# Patient Record
Sex: Male | Born: 1935 | Race: White | Hispanic: No | Smoking: Former smoker
Health system: Southern US, Community
[De-identification: ages and names within clinical notes are randomized; demographics above are authoritative.]

## PROBLEM LIST (undated history)

## (undated) DIAGNOSIS — F333 Major depressive disorder, recurrent, severe with psychotic symptoms: Secondary | ICD-10-CM

## (undated) DIAGNOSIS — J45909 Unspecified asthma, uncomplicated: Secondary | ICD-10-CM

## (undated) DIAGNOSIS — F411 Generalized anxiety disorder: Secondary | ICD-10-CM

## (undated) DIAGNOSIS — J449 Chronic obstructive pulmonary disease, unspecified: Secondary | ICD-10-CM

## (undated) DIAGNOSIS — G4700 Insomnia, unspecified: Secondary | ICD-10-CM

## (undated) DIAGNOSIS — C801 Malignant (primary) neoplasm, unspecified: Secondary | ICD-10-CM

## (undated) HISTORY — DX: Major depressive disorder, recurrent, severe with psychotic symptoms: F33.3

## (undated) HISTORY — PX: LUNG SURGERY: SHX703

## (undated) HISTORY — PX: HEMORROIDECTOMY: SUR656

## (undated) HISTORY — DX: Insomnia, unspecified: G47.00

## (undated) HISTORY — DX: Generalized anxiety disorder: F41.1

---

## 2004-10-29 ENCOUNTER — Ambulatory Visit: Payer: Self-pay | Admitting: Unknown Physician Specialty

## 2005-02-18 ENCOUNTER — Ambulatory Visit: Payer: Self-pay | Admitting: Internal Medicine

## 2005-05-03 ENCOUNTER — Inpatient Hospital Stay: Payer: Self-pay | Admitting: Psychiatry

## 2006-12-31 ENCOUNTER — Other Ambulatory Visit: Payer: Self-pay

## 2006-12-31 ENCOUNTER — Emergency Department: Payer: Self-pay | Admitting: Emergency Medicine

## 2007-11-29 ENCOUNTER — Ambulatory Visit: Payer: Self-pay | Admitting: Internal Medicine

## 2008-02-11 ENCOUNTER — Ambulatory Visit: Payer: Self-pay | Admitting: Internal Medicine

## 2008-04-03 ENCOUNTER — Ambulatory Visit: Payer: Self-pay | Admitting: Ophthalmology

## 2008-04-15 ENCOUNTER — Ambulatory Visit: Payer: Self-pay | Admitting: Ophthalmology

## 2009-03-01 ENCOUNTER — Inpatient Hospital Stay: Payer: Self-pay | Admitting: Internal Medicine

## 2009-07-22 ENCOUNTER — Ambulatory Visit: Payer: Self-pay | Admitting: Ophthalmology

## 2010-01-04 ENCOUNTER — Ambulatory Visit: Payer: Self-pay | Admitting: Unknown Physician Specialty

## 2010-05-03 ENCOUNTER — Emergency Department: Payer: Self-pay | Admitting: Unknown Physician Specialty

## 2010-05-19 ENCOUNTER — Inpatient Hospital Stay: Payer: Self-pay | Admitting: Surgery

## 2010-06-01 ENCOUNTER — Ambulatory Visit: Payer: Self-pay | Admitting: Surgery

## 2010-12-31 ENCOUNTER — Ambulatory Visit: Payer: Self-pay | Admitting: Internal Medicine

## 2011-08-27 ENCOUNTER — Emergency Department: Payer: Self-pay | Admitting: *Deleted

## 2011-08-27 LAB — BASIC METABOLIC PANEL
BUN: 13 mg/dL (ref 7–18)
Co2: 29 mmol/L (ref 21–32)
EGFR (Non-African Amer.): >60
Glucose: 95 mg/dL (ref 65–99)
Potassium: 4.3 mmol/L (ref 3.5–5.1)

## 2011-08-27 LAB — PROTIME-INR
INR: 0.9
Prothrombin Time: 12.8 secs (ref 11.5–14.7)

## 2011-08-27 LAB — CBC
HCT: 35.7 % — ABNORMAL LOW (ref 40.0–52.0)
HGB: 12.2 g/dL — ABNORMAL LOW (ref 13.0–18.0)
MCHC: 34.1 g/dL (ref 32.0–36.0)
MCV: 94 fL (ref 80–100)
Platelet: 183 10*3/uL (ref 150–440)
RBC: 3.8 10*6/uL — ABNORMAL LOW (ref 4.40–5.90)

## 2012-02-01 ENCOUNTER — Ambulatory Visit: Payer: Self-pay | Admitting: Internal Medicine

## 2013-02-11 ENCOUNTER — Ambulatory Visit: Payer: Self-pay | Admitting: Unknown Physician Specialty

## 2013-02-12 LAB — PATHOLOGY REPORT

## 2013-03-27 ENCOUNTER — Ambulatory Visit: Payer: Self-pay | Admitting: Internal Medicine

## 2013-04-22 ENCOUNTER — Inpatient Hospital Stay: Payer: Self-pay | Admitting: Surgery

## 2013-04-22 LAB — APTT: ACTIVATED PTT: 29.2 s (ref 23.6–35.9)

## 2013-04-22 LAB — BASIC METABOLIC PANEL
Anion Gap: 6 — ABNORMAL LOW (ref 7–16)
BUN: 24 mg/dL — AB (ref 7–18)
CO2: 29 mmol/L (ref 21–32)
Calcium, Total: 9.2 mg/dL (ref 8.5–10.1)
Chloride: 101 mmol/L (ref 98–107)
Creatinine: 0.94 mg/dL (ref 0.60–1.30)
Glucose: 112 mg/dL — ABNORMAL HIGH (ref 65–99)
Osmolality: 277 (ref 275–301)
Potassium: 3.7 mmol/L (ref 3.5–5.1)
SODIUM: 136 mmol/L (ref 136–145)

## 2013-04-22 LAB — CK TOTAL AND CKMB (NOT AT ARMC)
CK, TOTAL: 96 U/L (ref 35–232)
CK-MB: 1.7 ng/mL (ref 0.5–3.6)

## 2013-04-22 LAB — CBC
HCT: 36.9 % — AB (ref 40.0–52.0)
HGB: 12.2 g/dL — ABNORMAL LOW (ref 13.0–18.0)
MCH: 30.8 pg (ref 26.0–34.0)
MCHC: 33.1 g/dL (ref 32.0–36.0)
MCV: 93 fL (ref 80–100)
Platelet: 198 10*3/uL (ref 150–440)
RBC: 3.97 10*6/uL — AB (ref 4.40–5.90)
RDW: 15.3 % — AB (ref 11.5–14.5)
WBC: 7 10*3/uL (ref 3.8–10.6)

## 2013-04-22 LAB — PROTIME-INR
INR: 1
PROTHROMBIN TIME: 13.1 s (ref 11.5–14.7)

## 2013-04-22 LAB — TROPONIN I: Troponin-I: <0.02 ng/mL

## 2013-04-23 ENCOUNTER — Encounter: Payer: Self-pay | Admitting: Oncology

## 2013-04-23 LAB — APTT: Activated PTT: 32.5 secs (ref 23.6–35.9)

## 2013-04-25 LAB — PATHOLOGY REPORT

## 2013-04-29 ENCOUNTER — Ambulatory Visit: Payer: Self-pay | Admitting: Cardiothoracic Surgery

## 2013-05-02 ENCOUNTER — Ambulatory Visit: Payer: Self-pay | Admitting: Cardiothoracic Surgery

## 2013-05-02 ENCOUNTER — Emergency Department: Payer: Self-pay | Admitting: Emergency Medicine

## 2013-05-02 LAB — URINALYSIS, COMPLETE
BACTERIA: NONE SEEN
Bilirubin,UR: NEGATIVE
Blood: NEGATIVE
Glucose,UR: NEGATIVE mg/dL (ref 0–75)
KETONE: NEGATIVE
Leukocyte Esterase: NEGATIVE
Nitrite: NEGATIVE
PH: 6 (ref 4.5–8.0)
Protein: NEGATIVE
RBC,UR: NONE SEEN /HPF (ref 0–5)
Specific Gravity: 1.006 (ref 1.003–1.030)
Squamous Epithelial: NONE SEEN
WBC UR: NONE SEEN /HPF (ref 0–5)

## 2013-05-19 ENCOUNTER — Ambulatory Visit: Payer: Self-pay | Admitting: Cardiothoracic Surgery

## 2013-06-16 ENCOUNTER — Ambulatory Visit: Payer: Self-pay | Admitting: Radiation Oncology

## 2013-06-16 ENCOUNTER — Ambulatory Visit: Payer: Self-pay | Admitting: Cardiothoracic Surgery

## 2013-08-17 ENCOUNTER — Inpatient Hospital Stay: Payer: Self-pay | Admitting: Internal Medicine

## 2013-08-17 LAB — TROPONIN I: Troponin-I: 0.02 ng/mL

## 2013-08-17 LAB — URINALYSIS, COMPLETE
Bacteria: NONE SEEN
Bilirubin,UR: NEGATIVE
GLUCOSE, UR: NEGATIVE mg/dL (ref 0–75)
KETONE: NEGATIVE
Nitrite: NEGATIVE
Ph: 6 (ref 4.5–8.0)
Protein: 100
Specific Gravity: 1.015 (ref 1.003–1.030)
Squamous Epithelial: 1
WBC UR: 9 /HPF (ref 0–5)

## 2013-08-17 LAB — CBC
HCT: 28.5 % — ABNORMAL LOW (ref 40.0–52.0)
HGB: 9.6 g/dL — ABNORMAL LOW (ref 13.0–18.0)
MCH: 30.6 pg (ref 26.0–34.0)
MCHC: 33.6 g/dL (ref 32.0–36.0)
MCV: 91 fL (ref 80–100)
Platelet: 212 10*3/uL (ref 150–440)
RBC: 3.13 10*6/uL — AB (ref 4.40–5.90)
RDW: 15.4 % — ABNORMAL HIGH (ref 11.5–14.5)
WBC: 9.3 10*3/uL (ref 3.8–10.6)

## 2013-08-17 LAB — BASIC METABOLIC PANEL
Anion Gap: 8 (ref 7–16)
BUN: 25 mg/dL — AB (ref 7–18)
CALCIUM: 7.8 mg/dL — AB (ref 8.5–10.1)
Chloride: 103 mmol/L (ref 98–107)
Co2: 29 mmol/L (ref 21–32)
Creatinine: 0.84 mg/dL (ref 0.60–1.30)
EGFR (African American): >60
EGFR (Non-African Amer.): >60
Glucose: 149 mg/dL — ABNORMAL HIGH (ref 65–99)
OSMOLALITY: 287 (ref 275–301)
POTASSIUM: 3.5 mmol/L (ref 3.5–5.1)
SODIUM: 140 mmol/L (ref 136–145)

## 2013-08-17 LAB — PRO B NATRIURETIC PEPTIDE: B-TYPE NATIURETIC PEPTID: 440 pg/mL (ref 0–450)

## 2013-08-18 LAB — BASIC METABOLIC PANEL
ANION GAP: 7 (ref 7–16)
BUN: 18 mg/dL (ref 7–18)
CALCIUM: 7.8 mg/dL — AB (ref 8.5–10.1)
CO2: 30 mmol/L (ref 21–32)
Chloride: 106 mmol/L (ref 98–107)
Creatinine: 0.69 mg/dL (ref 0.60–1.30)
EGFR (African American): >60
EGFR (Non-African Amer.): >60
Glucose: 107 mg/dL — ABNORMAL HIGH (ref 65–99)
Osmolality: 287 (ref 275–301)
Potassium: 2.9 mmol/L — ABNORMAL LOW (ref 3.5–5.1)
SODIUM: 143 mmol/L (ref 136–145)

## 2013-08-18 LAB — MAGNESIUM: Magnesium: 2 mg/dL

## 2013-08-19 LAB — MAGNESIUM: Magnesium: 1.8 mg/dL

## 2013-08-19 LAB — BASIC METABOLIC PANEL
Anion Gap: 5 — ABNORMAL LOW (ref 7–16)
BUN: 11 mg/dL (ref 7–18)
CALCIUM: 8 mg/dL — AB (ref 8.5–10.1)
CHLORIDE: 102 mmol/L (ref 98–107)
CO2: 33 mmol/L — AB (ref 21–32)
CREATININE: 0.87 mg/dL (ref 0.60–1.30)
EGFR (African American): >60
GLUCOSE: 111 mg/dL — AB (ref 65–99)
Osmolality: 279 (ref 275–301)
Potassium: 3.4 mmol/L — ABNORMAL LOW (ref 3.5–5.1)
Sodium: 140 mmol/L (ref 136–145)

## 2013-08-20 LAB — BASIC METABOLIC PANEL
Anion Gap: 3 — ABNORMAL LOW (ref 7–16)
BUN: 20 mg/dL — AB (ref 7–18)
CALCIUM: 8.6 mg/dL (ref 8.5–10.1)
CHLORIDE: 104 mmol/L (ref 98–107)
CO2: 34 mmol/L — AB (ref 21–32)
CREATININE: 0.8 mg/dL (ref 0.60–1.30)
EGFR (Non-African Amer.): >60
Glucose: 187 mg/dL — ABNORMAL HIGH (ref 65–99)
Osmolality: 289 (ref 275–301)
POTASSIUM: 3.9 mmol/L (ref 3.5–5.1)
Sodium: 141 mmol/L (ref 136–145)

## 2013-08-20 LAB — URINE CULTURE

## 2013-08-21 ENCOUNTER — Encounter: Payer: Self-pay | Admitting: Internal Medicine

## 2013-08-22 LAB — CULTURE, BLOOD (SINGLE)

## 2013-10-03 ENCOUNTER — Ambulatory Visit: Payer: Self-pay | Admitting: Radiation Oncology

## 2013-10-10 ENCOUNTER — Ambulatory Visit: Payer: Self-pay | Admitting: Radiation Oncology

## 2013-12-01 ENCOUNTER — Emergency Department: Payer: Self-pay | Admitting: Internal Medicine

## 2013-12-01 LAB — URINALYSIS, COMPLETE
BILIRUBIN, UR: NEGATIVE
Bacteria: NONE SEEN
Glucose,UR: NEGATIVE mg/dL (ref 0–75)
KETONE: NEGATIVE
NITRITE: NEGATIVE
PROTEIN: NEGATIVE
Ph: 6 (ref 4.5–8.0)
Specific Gravity: 1.009 (ref 1.003–1.030)
Squamous Epithelial: 1
WBC UR: 4 /HPF (ref 0–5)

## 2013-12-21 ENCOUNTER — Emergency Department: Payer: Self-pay | Admitting: Emergency Medicine

## 2013-12-21 LAB — COMPREHENSIVE METABOLIC PANEL
Albumin: 3.5 g/dL (ref 3.4–5.0)
Alkaline Phosphatase: 72 U/L
Anion Gap: 2 — ABNORMAL LOW (ref 7–16)
BILIRUBIN TOTAL: 0.2 mg/dL (ref 0.2–1.0)
BUN: 18 mg/dL (ref 7–18)
CHLORIDE: 107 mmol/L (ref 98–107)
CO2: 29 mmol/L (ref 21–32)
Calcium, Total: 8.7 mg/dL (ref 8.5–10.1)
Creatinine: 0.79 mg/dL (ref 0.60–1.30)
EGFR (African American): >60
EGFR (Non-African Amer.): >60
GLUCOSE: 98 mg/dL (ref 65–99)
Osmolality: 278 (ref 275–301)
Potassium: 3.8 mmol/L (ref 3.5–5.1)
SGOT(AST): 16 U/L (ref 15–37)
SGPT (ALT): 22 U/L
Sodium: 138 mmol/L (ref 136–145)
TOTAL PROTEIN: 7.1 g/dL (ref 6.4–8.2)

## 2013-12-21 LAB — CBC
HCT: 40.3 % (ref 40.0–52.0)
HGB: 12.8 g/dL — ABNORMAL LOW (ref 13.0–18.0)
MCH: 29.6 pg (ref 26.0–34.0)
MCHC: 31.8 g/dL — ABNORMAL LOW (ref 32.0–36.0)
MCV: 93 fL (ref 80–100)
Platelet: 164 10*3/uL (ref 150–440)
RBC: 4.33 10*6/uL — AB (ref 4.40–5.90)
RDW: 14.9 % — ABNORMAL HIGH (ref 11.5–14.5)
WBC: 6.5 10*3/uL (ref 3.8–10.6)

## 2014-03-12 ENCOUNTER — Ambulatory Visit: Payer: Self-pay | Admitting: Radiation Oncology

## 2014-04-02 LAB — HEPATIC FUNCTION PANEL
ALK PHOS: 58 U/L (ref 25–125)
ALT: 14 U/L (ref 10–40)
AST: 14 U/L (ref 14–40)
Bilirubin, Total: 0.5 mg/dL

## 2014-04-02 LAB — LIPID PANEL
Cholesterol: 147 mg/dL (ref 0–200)
HDL: 64 mg/dL (ref 35–70)
LDL CALC: 61 mg/dL
TRIGLYCERIDES: 108 mg/dL (ref 40–160)

## 2014-04-02 LAB — BASIC METABOLIC PANEL
BUN: 16 mg/dL (ref 4–21)
Creatinine: 0.8 mg/dL (ref <=1.3)
GLUCOSE: 9 mg/dL
POTASSIUM: 5.3 mmol/L (ref 3.4–5.3)
SODIUM: 142 mmol/L (ref 137–147)

## 2014-04-02 LAB — TSH: TSH: 1.58 u[IU]/mL (ref 0.41–5.90)

## 2014-04-07 ENCOUNTER — Inpatient Hospital Stay: Payer: Self-pay | Admitting: Internal Medicine

## 2014-04-07 LAB — COMPREHENSIVE METABOLIC PANEL
ALK PHOS: 62 U/L
ALT: 12 U/L — AB
Albumin: 2.6 g/dL — ABNORMAL LOW (ref 3.4–5.0)
Anion Gap: 8 (ref 7–16)
BILIRUBIN TOTAL: 0.5 mg/dL (ref 0.2–1.0)
BUN: 27 mg/dL — AB (ref 7–18)
CO2: 26 mmol/L (ref 21–32)
CREATININE: 1.21 mg/dL (ref 0.60–1.30)
Calcium, Total: 7.8 mg/dL — ABNORMAL LOW (ref 8.5–10.1)
Chloride: 104 mmol/L (ref 98–107)
EGFR (African American): >60
EGFR (Non-African Amer.): >60
GLUCOSE: 157 mg/dL — AB (ref 65–99)
Osmolality: 284 (ref 275–301)
Potassium: 3.5 mmol/L (ref 3.5–5.1)
SGOT(AST): 20 U/L (ref 15–37)
SODIUM: 138 mmol/L (ref 136–145)
Total Protein: 6.7 g/dL (ref 6.4–8.2)

## 2014-04-07 LAB — CBC WITH DIFFERENTIAL/PLATELET
BASOS ABS: 0 10*3/uL (ref 0.0–0.1)
Basophil %: 0.1 %
EOS ABS: 0 10*3/uL (ref 0.0–0.7)
Eosinophil %: 0.3 %
HCT: 34.2 % — ABNORMAL LOW (ref 40.0–52.0)
HGB: 11.4 g/dL — ABNORMAL LOW (ref 13.0–18.0)
LYMPHS PCT: 7.1 %
Lymphocyte #: 0.7 10*3/uL — ABNORMAL LOW (ref 1.0–3.6)
MCH: 32.2 pg (ref 26.0–34.0)
MCHC: 33.3 g/dL (ref 32.0–36.0)
MCV: 97 fL (ref 80–100)
MONO ABS: 0.8 x10 3/mm (ref 0.2–1.0)
Monocyte %: 8.2 %
NEUTROS ABS: 7.7 10*3/uL — AB (ref 1.4–6.5)
Neutrophil %: 84.3 %
Platelet: 145 10*3/uL — ABNORMAL LOW (ref 150–440)
RBC: 3.54 10*6/uL — AB (ref 4.40–5.90)
RDW: 14.7 % — AB (ref 11.5–14.5)
WBC: 9.2 10*3/uL (ref 3.8–10.6)

## 2014-04-07 LAB — URINALYSIS, COMPLETE
Bilirubin,UR: NEGATIVE
GLUCOSE, UR: NEGATIVE mg/dL (ref 0–75)
KETONE: NEGATIVE
Nitrite: POSITIVE
PH: 5 (ref 4.5–8.0)
Protein: 100
RBC,UR: 16 /HPF (ref 0–5)
SQUAMOUS EPITHELIAL: NONE SEEN
Specific Gravity: 1.016 (ref 1.003–1.030)
WBC UR: 1354 /HPF (ref 0–5)

## 2014-04-07 LAB — PROTIME-INR
INR: 1.2
Prothrombin Time: 14.6 secs (ref 11.5–14.7)

## 2014-04-07 LAB — TROPONIN I: Troponin-I: <0.02 ng/mL

## 2014-04-08 LAB — CBC WITH DIFFERENTIAL/PLATELET
BASOS ABS: 0 10*3/uL (ref 0.0–0.1)
BASOS PCT: 0 %
EOS ABS: 0 10*3/uL (ref 0.0–0.7)
EOS PCT: 0 %
HCT: 32.3 % — ABNORMAL LOW (ref 40.0–52.0)
HGB: 10.5 g/dL — ABNORMAL LOW (ref 13.0–18.0)
Lymphocyte #: 0.3 10*3/uL — ABNORMAL LOW (ref 1.0–3.6)
Lymphocyte %: 4.2 %
MCH: 31.9 pg (ref 26.0–34.0)
MCHC: 32.5 g/dL (ref 32.0–36.0)
MCV: 98 fL (ref 80–100)
MONOS PCT: 5.2 %
Monocyte #: 0.4 x10 3/mm (ref 0.2–1.0)
NEUTROS ABS: 6.7 10*3/uL — AB (ref 1.4–6.5)
Neutrophil %: 90.6 %
Platelet: 127 10*3/uL — ABNORMAL LOW (ref 150–440)
RBC: 3.29 10*6/uL — ABNORMAL LOW (ref 4.40–5.90)
RDW: 14.8 % — AB (ref 11.5–14.5)
WBC: 7.4 10*3/uL (ref 3.8–10.6)

## 2014-04-08 LAB — BASIC METABOLIC PANEL
Anion Gap: 5 — ABNORMAL LOW (ref 7–16)
BUN: 21 mg/dL — ABNORMAL HIGH (ref 7–18)
Calcium, Total: 7.7 mg/dL — ABNORMAL LOW (ref 8.5–10.1)
Chloride: 110 mmol/L — ABNORMAL HIGH (ref 98–107)
Co2: 28 mmol/L (ref 21–32)
Creatinine: 0.9 mg/dL (ref 0.60–1.30)
EGFR (African American): >60
Glucose: 175 mg/dL — ABNORMAL HIGH (ref 65–99)
Osmolality: 292 (ref 275–301)
POTASSIUM: 3.5 mmol/L (ref 3.5–5.1)
Sodium: 143 mmol/L (ref 136–145)

## 2014-04-09 ENCOUNTER — Emergency Department: Payer: Self-pay | Admitting: Emergency Medicine

## 2014-04-09 LAB — COMPREHENSIVE METABOLIC PANEL
ALBUMIN: 2.4 g/dL — AB (ref 3.4–5.0)
ALK PHOS: 65 U/L
Anion Gap: 7 (ref 7–16)
BILIRUBIN TOTAL: 0.4 mg/dL (ref 0.2–1.0)
BUN: 18 mg/dL (ref 7–18)
CALCIUM: 8.2 mg/dL — AB (ref 8.5–10.1)
CO2: 30 mmol/L (ref 21–32)
Chloride: 104 mmol/L (ref 98–107)
Creatinine: 0.93 mg/dL (ref 0.60–1.30)
EGFR (Non-African Amer.): >60
GLUCOSE: 92 mg/dL (ref 65–99)
OSMOLALITY: 283 (ref 275–301)
Potassium: 4.2 mmol/L (ref 3.5–5.1)
SGOT(AST): 33 U/L (ref 15–37)
SGPT (ALT): 18 U/L
Sodium: 141 mmol/L (ref 136–145)
Total Protein: 6.5 g/dL (ref 6.4–8.2)

## 2014-04-09 LAB — URINALYSIS, COMPLETE
BACTERIA: NONE SEEN
Bilirubin,UR: NEGATIVE
Glucose,UR: NEGATIVE mg/dL (ref 0–75)
KETONE: NEGATIVE
Leukocyte Esterase: NEGATIVE
Nitrite: NEGATIVE
PH: 6 (ref 4.5–8.0)
Protein: NEGATIVE
RBC,UR: NONE SEEN /HPF (ref 0–5)
SPECIFIC GRAVITY: 1.006 (ref 1.003–1.030)
Squamous Epithelial: <1
WBC UR: 4 /HPF (ref 0–5)

## 2014-04-09 LAB — CBC WITH DIFFERENTIAL/PLATELET
BASOS PCT: 0.2 %
Basophil #: 0 10*3/uL (ref 0.0–0.1)
EOS ABS: 0 10*3/uL (ref 0.0–0.7)
Eosinophil %: 0.2 %
HCT: 33.5 % — AB (ref 40.0–52.0)
HGB: 11.1 g/dL — ABNORMAL LOW (ref 13.0–18.0)
LYMPHS ABS: 0.9 10*3/uL — AB (ref 1.0–3.6)
Lymphocyte %: 8.2 %
MCH: 31.8 pg (ref 26.0–34.0)
MCHC: 33.1 g/dL (ref 32.0–36.0)
MCV: 96 fL (ref 80–100)
MONO ABS: 1.2 x10 3/mm — AB (ref 0.2–1.0)
MONOS PCT: 11.5 %
NEUTROS PCT: 79.9 %
Neutrophil #: 8.5 10*3/uL — ABNORMAL HIGH (ref 1.4–6.5)
PLATELETS: 180 10*3/uL (ref 150–440)
RBC: 3.48 10*6/uL — ABNORMAL LOW (ref 4.40–5.90)
RDW: 15.2 % — ABNORMAL HIGH (ref 11.5–14.5)
WBC: 10.7 10*3/uL — ABNORMAL HIGH (ref 3.8–10.6)

## 2014-04-09 LAB — TROPONIN I

## 2014-04-11 LAB — URINE CULTURE

## 2014-04-12 LAB — CULTURE, BLOOD (SINGLE)

## 2014-04-14 LAB — CULTURE, BLOOD (SINGLE)

## 2014-04-16 ENCOUNTER — Ambulatory Visit: Payer: Self-pay | Admitting: Radiation Oncology

## 2014-08-01 DIAGNOSIS — J449 Chronic obstructive pulmonary disease, unspecified: Secondary | ICD-10-CM | POA: Insufficient documentation

## 2014-08-01 DIAGNOSIS — N139 Obstructive and reflux uropathy, unspecified: Secondary | ICD-10-CM | POA: Insufficient documentation

## 2014-08-01 DIAGNOSIS — G4700 Insomnia, unspecified: Secondary | ICD-10-CM | POA: Insufficient documentation

## 2014-08-01 DIAGNOSIS — H3530 Unspecified macular degeneration: Secondary | ICD-10-CM | POA: Insufficient documentation

## 2014-08-01 DIAGNOSIS — Z859 Personal history of malignant neoplasm, unspecified: Secondary | ICD-10-CM | POA: Insufficient documentation

## 2014-08-01 DIAGNOSIS — N429 Disorder of prostate, unspecified: Secondary | ICD-10-CM | POA: Insufficient documentation

## 2014-08-01 DIAGNOSIS — Z8719 Personal history of other diseases of the digestive system: Secondary | ICD-10-CM | POA: Insufficient documentation

## 2014-08-01 DIAGNOSIS — F411 Generalized anxiety disorder: Secondary | ICD-10-CM | POA: Insufficient documentation

## 2014-08-01 DIAGNOSIS — F333 Major depressive disorder, recurrent, severe with psychotic symptoms: Secondary | ICD-10-CM | POA: Insufficient documentation

## 2014-08-01 DIAGNOSIS — Z8639 Personal history of other endocrine, nutritional and metabolic disease: Secondary | ICD-10-CM | POA: Insufficient documentation

## 2014-08-09 NOTE — Discharge Summary (Signed)
PATIENT NAME:  WHITNEY, BINGAMAN MR#:  [id number] DATE OF BIRTH:  01-Nov-1929  DATE OF ADMISSION:  08/17/2013 DATE OF DISCHARGE:  08/21/2013  Addendum  The patient is being discharged 08/21/2013.  No changes to any of his medications. Insurance was not approved by his planned discharge date of the 5th; that was why he was kept in the hospital.   ____________________________ Homero Hyson P. Benjie Karvonen, MD spm:dmm D: 08/21/2013 12:26:56 ET T: 08/21/2013 21:17:30 ET JOB#: [id number]  cc: Keyondre Hepburn P. Benjie Karvonen, MD, <Dictator> Donell Beers Wilberto Console MD ELECTRONICALLY SIGNED 08/22/2013 14:14

## 2014-08-09 NOTE — H&P (Signed)
PATIENT NAME:  Timothy Ali, ISENBERG MR#:  [id number] DATE OF BIRTH:  [birth year]  DATE OF ADMISSION:  04/22/2013  REASON FOR ADMISSION: Left spontaneous recurrent pneumothorax.   HISTORY OF PRESENT ILLNESS: Mr. Deal is a pleasant 81 year old male with a history of COPD, who is on oxygen p.r.n., who was previously admitted in 2012 for spontaneous pneumothorax. He had been suffering from a cold  for which he was on ciprofloxacin as well as Mucomyst for the past 3 weeks and woke up with shortness of breath and left-sided abdominal pain. He is presently doing well. No fevers, chills or night sweats. Positive shortness of breath. No abdominal pain, nausea, vomiting, diarrhea, constipation, dysuria or hematuria.   PAST MEDICAL HISTORY:  1.  COPD. Oxygen at home on a p.r.n. basis.  2.  History of anxiety.  3.  History of depression.  4.  History of spontaneous pneumothorax and left tube thoracostomy placement.  5.  History of hemorrhoids.  6.  History of left arm surgery.  7.  History of left neck surgery.   FAMILY HISTORY: Noncontributory.   SOCIAL HISTORY: He stopped smoking 17 years ago. Denies alcohol. He is retired.   ALLERGIES: PENICILLIN.   MEDICATIONS: As follows:  1.  Phenytoin. 2.  Terazosin. 3.  Spiriva. 4.  Seroquel.  5.  Nexium. 6.  Lipitor. 7.  Flomax.  8.  Vitamins.   9.  Effexor.  10. Dulara.  11. Ativan.   REVIEW OF SYSTEMS: A 12-point review of systems was obtained. Pertinent positives and negatives as above.   PHYSICAL EXAMINATION:  VITAL SIGNS: Temperature 97.9, pulse 113, blood pressure 131/74, respirations 25 and 93% on room air.  GENERAL: No acute distress. Alert and oriented x 3. HEENT: Normocephalic, atraumatic. Eyes: No scleral icterus. No conjunctivitis. Face: No obvious facial trauma. Normal external nose. Normal external ears. CHEST: Decreased breath sounds on the left. He does appear to be short of breath.  ABDOMEN: Soft, nontender, nondistended.   EXTREMITIES: Moves all extremities well. Strength 5 out of 5.  NEUROLOGIC: Cranial nerves II through XII grossly intact.   LABORATORY DATA: Significant for a white cell count of 7, otherwise unremarkable. Chest shows a significant left-sided pneumothorax.   ASSESSMENT AND PLAN: Mr. Goggins is a pleasant 81 year old male with a significant left-sided pneumothorax. He is to undergo chest tube placement and subsequent evaluation with Dr. Genevive Bi for likely thoracostomy and pleurodesis. I have spoken to Mr. Pruett about the potential benefits and complications associated with chest tube placement and informed consent was obtained.    ____________________________ Glena Norfolk. Zacheriah Stumpe, MD cal:aw D: 04/22/2013 06:52:59 ET T: 04/22/2013 07:10:48 ET JOB#: [id number]  cc: Harrell Gave A. Tayshon Winker, MD, <Dictator> Floyde Parkins MD ELECTRONICALLY SIGNED 04/30/2013 16:43

## 2014-08-09 NOTE — H&P (Signed)
PATIENT NAME:  Timothy Ali, BOOMHOWER MR#:  [id number] DATE OF BIRTH:  Nov 11, 1929  DATE OF ADMISSION:  08/17/2013  CHIEF COMPLAINT: Altered mental status.   HISTORY OF PRESENT ILLNESS: The patient is an 81 year old white male with a history of severe COPD, complicated by restrictive physiology from fibrosis with a recent diagnosis of squamous cell carcinoma of the left lung found during admission for a large pneumothorax in January 2015, who presents today with a 1-2-week history of altered mental status. The patient is a poor historian and all history is provided by his 2 sisters who are present today. The sisters state that for the last several days he has been very delirious and having fevers and decreased urination. He lives at home and is supposed to be self catheterization himself 4 times daily due to an as yet undiagnosed bladder problem. It is unclear whether he has prostatic or urethral obstruction but he has been seen by Dr. Elnoria Howard, his urologist and has had a cystoscopy and some type of procedure which unblocked his urethra. The patient states that he has been self catheterization himself 3 times a day instead of 4 times a day. However, today he was complaining of severe back pain and given his confusion, his sisters insisted that he come to the Emergency Room. He has a chronic productive cough due to COPD and they do not think his cough is over his baseline cough. However, they have not really seen him very much over the last several days because he does live alone. The patient was admitted in January for treatment of a large left pneumothorax which required a tube/thoracostomy and during that admission was found to have a squamous cell cancer of the lung. This was treated with XRT by Dr. Baruch Gouty and per family, the cancer has been eradicated and patient is to follow up with a repeat CT scan in June with Dr. Ma Hillock.   The patient's primary care is Dr. Benita Stabile in [place]. He was previously under the  psychiatric care of Dr. Honor Loh for bipolar disorder but is currently without a psychiatrist. His urologist is Dr. Elnoria Howard.   PAST MEDICAL HISTORY:  1. Squamous cell cancer of the lung diagnosed in January 2015, with a left lower lobe nodule. Supposedly, now in remission, status post XRT. He is not a candidate for pneumonectomy or lobectomy due to his severe COPD.  2. COPD, severe by pulmonary function test done in January with restrictive volumes noted.  3. History of bipolar disorder and other psychiatric diagnoses, NOS, on chronic Seroquel for years.   MEDICATIONS:  1. Seroquel 300 mg b.i.d. 2. Effexor 75 mg once daily. 3. Dulera 100 mcg 2 puffs inhaled to 2 times daily.  4. Ciprofloxacin 500 mg every 12 hours. 5. Ativan 1 mg orally 3 times daily. 6. Flomax 0.4 mg once daily.  7. Lipitor 40 mg once daily.  8. Spiriva 18 mcg 1 nebulizer inhaled daily.  9. Terazosin 10 mg once daily.  10. Ventolin HFA 90 mcg 2 puffs 4 times daily.  11. Zolpidem 10 mg 1 daily.   PAST SURGICAL HISTORY: Left tube thoracostomy for management of spontaneous pneumothorax January 2000, history of left arm surgery, and history of left neck surgery.    ALLERGIES: PENICILLIN.   LAST HOSPITALIZATION: January 2015.   FAMILY HISTORY: Noncontributory.   SOCIAL HISTORY: He is an ex-smoker, quit 15 years ago. He is widowed for 5 years. He has 2 estranged daughters who live in [place] and out  of state. Two sisters who live close by.   REVIEW OF SYSTEMS: Difficult due to his altered mental status. He denies weight loss and fevers. He denies any changes in vision. He denies any trouble swallowing. He denies any hemoptysis. He has a chronic cough with wheezing due to COPD. He denies chest pain. He has noted some dyspnea with exertion. He has no history of nausea, vomiting, diarrhea. He does endorse currently back pain. He denies polyuria and nocturia. He states that he catheterizes himself once in the evenings and  twice during the day. He denies any history of bruising or bleeding. He does have some nonspecific back pain but he cannot elaborate on it. He has no history of tremors, vertigos, or strokes. He does have a history of bipolar and some other psychiatric disorder and has chronic insomnia too.   PHYSICAL EXAMINATION:  GENERAL: This is a thin elderly male in no apparent distress.  VITAL SIGNS: Blood pressure 145/64, repeat 131/63, pulse initially 110, now 92. Temperature 100.5 T-max, respirations 20. He is sating 98.9% on room air.  HEENT: Pupils are equal, round, and reactive to light. Extraocular movements are intact. Sclerae are non icteric.  OROPHARYNX: Benign.  NECK: Supple without lymphadenopathy, JVD, thyromegaly, or carotid bruits.  LUNGS: Notable for decreased breath sounds at the left base with some end-expiratory wheezing. No egophony noted.  CARDIOVASCULAR: Tachycardic but regular with no murmurs, rubs, or gallops.  ABDOMEN: Soft, nontender, and nondistended with good bowel sounds and no evidence of hepatosplenomegaly. He is moving all extremities, but gait was not tested.  SKIN: Skin is warm and dry without rashes or lesions.  LYMPH: There is no cervical, axillary, inguinal, or supraclavicular lymphadenopathy.  NEUROLOGICAL: Exam appears to be grossly nonfocal. He is alert but not oriented to person, place, or time. He is somewhat agitated but does follow commands.  LABORATORY DATA: Sodium 140, potassium 3.5, chloride 103, bicarbonate 29, BUN 25, creatinine 0.84, glucose 149. White count 9.3, hemoglobin 9.6, platelets 212,000. Urinalysis shows 9 white cells, 11 red cells, no leukocyte esterase or nitrates. Troponin I is 0.02.   Chest x-ray portable: Interval mild changes of congestive heart failure, superimposed on COPD, interval patchy opacity in the left mid and lower lung zones and right lung base. Differential considerations included pneumonia and asymmetrical alveolar edema/post  radiation changes. EKG shows sinus tachycardia with nonspecific ST-T wave changes. No old one for comparison at this time.   ASSESSMENT AND PLAN:  1. Delirium likely secondary to hypoxia, pneumonia, and recent evidence of a urinary tract infection. Will continue Seroquel and Ativan. Admit to med-surg unit. Continue antibiotics and had gentle hydration as he does appear to be dehydrated based on his history and skin turgor.  2. Left lower lobe patchy opacity concerning for pneumonia versus recurrence of lung cancer.  3. Once he is better hydrated, I will recommend CT of the chest for further evaluation.  4. Urinary retention. Unclear why he is having urinary retention whether this is neurogenic bladder or prostatic hypertrophy. He does have a local urologist, who has been treating him with Flomax and terazosin suggesting that benign prostatic hypertrophy is the cause. He was recently treated with Cipro. He is currently getting Levaquin for presumed pneumonia. He has refused a Foley catheter and states that he continues to prefer in and out caths, which will be ordered every 6 hours along with a bladder scan.  5. Chronic obstructive pulmonary disease. Continue Spiriva and use Symbicort while in house since  Ruthe Mannan is not available. DuoNeb is ordered. We will consider adding prednisone but given his altered mental status I am concerned this may add his delirium.  6. History of bipolar disorder. Continue Seroquel 300 mg b.i.d. and Ativan and Effexor.  7. Goals of care: The patient has no healthcare power of attorney. No living will. I did discuss this with his sisters today with the recommendations at the family decide very soon, who will be his healthcare power of attorney given his serious medical conditions.  ESTIMATED TIME OF CARE: 60 minutes.    ____________________________ Deborra Medina, MD tt:lt D: 08/17/2013 20:19:55 ET T: 08/17/2013 21:53:10 ET JOB#: [id number]  cc: Deborra Medina, MD,  <Dictator> Deborra Medina MD ELECTRONICALLY SIGNED 08/20/2013 7:53

## 2014-08-09 NOTE — Consult Note (Signed)
PATIENT NAME:  Timothy Ali, Timothy Ali MR#:  [id number] DATE OF BIRTH:  1930-03-23  DATE OF CONSULTATION:  04/09/2014  CONSULTING PHYSICIAN:  Tana Conch. Leslye Peer, MD  PRIMARY CARE PHYSICIAN:  Dr. Benita Stabile  EMERGENCY ROOM PHYSICIAN:  Dr. Yetta Numbers. Karma Greaser.   CHIEF COMPLAINT: Shortness of breath.   HISTORY OF PRESENT ILLNESS: This is an 81 year old man with COPD, end-stage coming in with shortness of breath. He was recently admitted to the hospital on 04/07/2014 and discharged 04/08/2014, had been admitted for pneumonia. He was initially started on Rocephin and Zithromax and then sent home on Levaquin. He took his first dose of Levaquin this morning likely got two doses of the Rocephin and Zithromax in the hospital. He developed worsening shortness of breath today and came into the ER for further evaluation. In the ER, he had a CT scan of the chest that showed no evidence of acute pulmonary embolism. Bilateral airspace disease is stable compared with two days ago. Urinalysis looks better than two days ago. The patient states that he had a low-grade fever and he is feeling fatigued, even when he feels well he does have shortness of breath and is limited with activity. Hospitalist services were contacted for a second opinion on what to do next  PAST MEDICAL HISTORY:  Squamous cell carcinoma of the left lung on radiation therapy, chronic obstructive pulmonary disease, chronic respiratory failure, bipolar disorder, hyperlipidemia, benign prostatic hypertrophy, insomnia, macular degeneration, and neurogenic bladder with self catheterizations.   PAST SURGICAL HISTORY: Left lung collapse x2, left arm surgery in the left neck surgery.   ALLERGIES: LISTED IN THE CHART AS PENICILLIN.   FAMILY HISTORY: Hypertension. Both parents died of lung cancer. Father died at 48. Mother died at 71.   SOCIAL HISTORY: A former smoker; quit 18 years back and lives alone.   MEDICATIONS:  Include albuterol CFC 90 mcg daily,  atorvastatin 40 mg daily, Dulera 105, 2 puffs twice a day, Levaquin 500 mg q.24 hours., lorazepam 1 mg 4 times a day, Mucinex 1200 mg twice a day, prednisone prescribed at discharge today, Seroquel 150 mg 2 tablets at bedtime, Spiriva 18 mcg daily, Flomax 0.4 mg daily, Terazosin 10 mg daily, Effexor 75 mg 2 capsules daily Ventolin HFA 2 puffs 4 times a day, Ambien 10 mg at bedtime.  REVIEW OF SYSTEMS:   CONSTITUTIONAL: Positive for fever. Positive for weight loss. Positive or fatigue.   EYES: He does have macular degeneration, right eye.  EARS, NOSE, MOUTH AND THROAT:  Positive for runny nose. No sore throat. No difficulty swallowing.   CARDIOVASCULAR: Chest pain with a cough.   RESPIRATORY: Positive for shortness breath. Positive for cough, yellow phlegm. No hemoptysis.   GASTROINTESTINAL: No nausea. No vomiting. No abdominal pain. No diarrhea. No constipation. No bright red blood per rectum. No melena.   GENITOURINARY: No burning on urination. No hematuria.   MUSCULOSKELETAL: Positive for joint pain in knees and hips.   INTEGUMENT: No rashes or eruptions.   NEUROLOGIC: No fainting or blackouts.   PSYCHIATRIC: Positive for anxiety and depression.   ENDOCRINE: No thyroid problems. Hematologic and lymphatic: No anemia.   PHYSICAL EXAMINATION:  VITAL SIGNS: Temperature 99, pulse 94, respirations 22, blood pressure 137/73, pulse oximetry 94% on oxygen.   GENERAL: No respiratory distress.   EYES: Conjunctivae and lids normal. Pupils equal, round, and reactive to light. Extraocular muscles intact. No nystagmus. Ears, nose, mouth and throat: Tympanic membranes: No erythema. Nasal mucosa: No erythema.   THROAT:  No erythema. No exudate seen. Lips and gums: No lesions.   NECK: No JVD. No bruits. No lymphadenopathy. No thyromegaly. No thyroid nodules palpated.   RESPIRATORY: Decreased breath sounds bilaterally. No rhonchi, rales, or wheeze heard.   CARDIOVASCULAR SYSTEM: S1, S2  normal. No gallops, rubs, or murmurs heard. Carotid upstroke 2+ bilaterally. No bruits. Dorsalis pedis pulses 2+ bilaterally. No edema of the lower extremity.   ABDOMEN: Soft, nontender. No organomegaly/splenomegaly. Normoactive bowel sounds. No masses felt.   LYMPHATIC: No lymph nodes in the neck.   MUSCULOSKELETAL: No clubbing. No edema. No cyanosis, on oxygen.   PSYCHIATRIC: The patient is alert, oriented to person, place, and time.   NEUROLOGIC: Cranial nerves II through XII grossly intact. Deep tendon reflexes 2+ bilateral lower extremities.   LABORATORY AND RADIOLOGICAL DATA: CT scan of the chest: No pulmonary embolism, bilateral airspace disease stable compared to two days ago. Lactic acid 1.2. Urinalysis 1+ blood, negative nitrites and leukocyte esterase. White blood cell count 10.7,H and H 11.1 and 33.5, platelet count 180, glucose 92, BUN 18, creatinine 0.93, sodium 141, potassium 4.2, chloride 104, CO2 of 30, and calcium 8.2. Liver function tests normal range. Albumin low at 2.4. Troponin negative.   EKG: Sinus tachy at 107 beats per minute.   ASSESSMENT AND PLAN:  1.  Pneumonia. The patient was in the hospital received Rocephin and Zithromax and upon discharge Levaquin 500 mg p.o. daily. I offered him the opportunity to come back in the hospital or the opportunity to go home. The patient decided he would go home and continue the treatment course at home.  2.  Chronic respiratory failure, chronic obstructive pulmonary disease. The patient was insistent on steroids to be added to the regimen and we gave him 125 mg of Solu-Medrol here, a prednisone taper 40 mg five day course. The patient will continue his inhalers.  3.  Squamous cell cancer of the lung. Follow up with radiation as outpatient.  4.  Bipolar and anxiety. The patient was very anxious about his breathing and his condition. He does have family members in the area, but does live alone. I offered the patient potential to go to  rehab if he is weak. The patient states "I'd rather be home."  5.  Hyperlipidemia, on Lipitor.  6.  Benign prostatic hypertrophy, on para Zosyn.  7.  Neurogenic bladder. The patient does self catheterizations. Urinalysis this time looks much better than two days ago. Unfortunately a urine culture was not sent off on the previous admission.    TIME SPENT ON ER CONSULTATION: 60 minutes.    ____________________________ Tana Conch. Leslye Peer, MD rjw:at D: 04/09/2014 17:26:15 ET T: 04/09/2014 17:54:23 ET JOB#: [id number]  cc: Tana Conch. Leslye Peer, MD, <Dictator> Leona Carry. Hall Busing, MD [place] Karma Greaser, MD Marisue Brooklyn MD ELECTRONICALLY SIGNED 04/17/2014 11:40

## 2014-08-09 NOTE — Discharge Summary (Signed)
PATIENT NAME:  Timothy Ali, Timothy Ali MR#:  [id number] DATE OF BIRTH:  19-Sep-1929  DATE OF ADMISSION:  08/17/2013 DATE OF DISCHARGE:  08/21/2013  ADMISSION DIAGNOSIS: Altered mental status secondary to hypoxia and pneumonia.  DISCHARGE DIAGNOSES:  1.  Delirium due to pneumonia and hypoxia.  2.  Acute respiratory failure from pneumonia.  3.  Pneumonia, community-acquired.  4.  Chronic obstructive pulmonary disease. 5.  Urinary retention. 6.  Lung cancer.  7.  Depression.   CONSULTATIONS: None.   DIAGNOSTIC DATA: At discharge Sodium 141, potassium 3.9, chloride 104, bicarb 34, BUN 20, creatinine 0.80, and glucose 187.   HOSPITAL COURSE: This is a very pleasant 81 year old male with a history of lung cancer status post XRT and urinary retention who presented with delirium. For further details, please refer to the H and P.  1.  Delirium due to pneumonia and hypoxia. The patient has improved and is back to baseline.  2.  Acute respiratory failure from his pneumonia. 3.  Pneumonia, community-acquired. The patient was on Levaquin. He will be treated for 6 more days on Levaquin. His blood cultures are negative to date.  4.  COPD, acute exacerbation, secondary to his pneumonia. I put the patient on IV steroids. He actually did well. He is back on his baseline of 2 to 3 liters of oxygen. It appears the patient is not compliant with oxygen and we discussed that he needs to remain on oxygen continuously.  5.  Urinary retention, unclear etiology. No history of prostate cancer, per the patient and family. The patient is followed by Dr. Elnoria Howard. He will continue on Flomax and he has a Foley which he will require. He was initially doing I and O catheterization, but he is unable to do this effectively.  6.  History of lung cancer status post radiation. The patient will follow up with Dr. Donella Stade in June. 7.  Depression and delusions. The patient will continue outpatient medications.   DISCHARGE MEDICATIONS: 1.   Dulera 2 puffs b.i.d.  2.  Spiriva 18 mcg daily.  3.  Effexor 75 mg daily. 4.  Flomax 0.4 mg daily.  5.  Seroquel 300 mg b.i.d. 6.  Ventolin 2 puffs 4 times a day.  7.  Terazosin 10 mg at bedtime.  8.  Ambien 10 mg at bedtime.  9.  Lipitor 40 mg daily.  10.  Ativan 1 mg t.i.d. p.r.n.  11.  Prednisone 10 mg starting at 50 mg and taper by 10 mg every 2 days.  12.  Levaquin 750 mg daily for 6 days.   DISCHARGE INSTRUCTIONS: Discharge with Foley to leg. Keep Foley in.  DISCHARGE OXYGEN: Three liters nasal cannula keeping oxygen saturation greater than 90%.   DISCHARGE DIET: Regular diet.   DISCHARGE ACTIVITY: As tolerated.   DISCHARGE REFERRAL: Physical therapy.   DISCHARGE FOLLOWUP: The patient will follow up with Dr. Benita Stabile in 1 week. He will follow up with Dr. Elnoria Howard, his urologist, in 1 to 2 weeks as well.  The patient is medically stable for discharge.   TIME SPENT ON DISCHARGE: 35 minutes.  ____________________________ Donell Beers. Benjie Karvonen, MD spm:sb D: 08/20/2013 12:52:37 ET T: 08/20/2013 13:05:19 ET JOB#: [id number]  cc: [place] Coll P. Benjie Karvonen, MD, <Dictator> Richard D. Elnoria Howard, Zebulon Hall Busing, MD Donell Beers Maize Brittingham MD ELECTRONICALLY SIGNED 08/21/2013 9:07

## 2014-08-09 NOTE — Consult Note (Signed)
Reason for Visit: This 81 year old Male patient presents to the clinic for initial evaluation of  lungs cancer .   Referred by Dr. Faith Rogue.  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 Recently noted to the hospital in early January for spontaneous pneumothorax of the left lung.upon admission on CT scan he was found to have a mass in the superior segment of the left upper lobe. On PET/CT scan this was hypermetabolic consistent with primary bronchogenic carcinoma. Second focus of hypermetabolic activity in the medial aspect of left upper lobe was feltto represent iatrogenic dramatic inflammation from chest tube placement. He had no evidence of mediastinal adenopathy on PET/CT scan.he underwent CT-assisted fine-needle aspiration positive for squamous cell carcinoma.patient upon her function testing is showing significant underlying COPD and an FEV1 of 34% of predicted. He is now referred to radiation oncology for consideration of treatment. He has declined surgery by surgical oncology. He is doing fairly well today no significant cough hemoptysis or chest tightness.  Past Hx:    Angina:    GERD - Esophageal Reflux:    COPD:    pneumothorax:    hyperlipidemia:    Emphysema:    hemmorhoid surgery:    depression with delusions:   Past, Family and Social History:  Past Medical History positive   Cardiovascular hyperlipidemia; angina   Respiratory COPD; emphysema   Gastrointestinal GERD    Neurological/Psychiatric depression; history of delusions   Past Surgical History hemorrhoidectomy, chest tube placement for spontaneous pneumothorax x2   Family History noncontributory   Social History positive   Social History Comments 50 pack year smoking history quit smoking 17 years prior etoh abuse history   Additional Past Medical and Surgical History accompanied by daughter today   Allergies:   PCN: Unknown  Home Meds:  Home Medications: Medication Instructions Status  Ambien 10 mg oral tablet 1 tab(s) orally once a day (at bedtime) Active  Effexor 75 mg oral capsule, extended release 1 cap(s) orally once a day Active  Flomax 0.4 mg oral capsule 1 cap(s) orally once a day Active  SEROquel 300 mg oral tablet 1 tab(s) orally 2 times a day Active  Ventolin HFA CFC free 90 mcg/inh inhalation aerosol 2 puff(s) inhaled 4 times a day, As Needed Active  Nexium 40 mg oral delayed release capsule 1 cap(s) orally once a day Active  Lipitor 40 mg oral tablet 1 tab(s) orally once a day (at bedtime) Active  Ativan 1 mg oral tablet 1 tab(s) orally 3 times a day, As Needed- for Anxiety, Nervousness  Active  Dulera 100 mcg-5 mcg/inh inhalation aerosol 2 puff(s) inhaled 2 times a day Active  Spiriva 18 mcg inhalation capsule 1 each inhaled once a day (or twice as needed) Active   Review of Systems:  General negative   Performance Status (ECOG) 0   Skin negative   Breast negative   Ophthalmologic negative   ENMT negative   Respiratory and Thorax see HPI   Cardiovascular see HPI   Gastrointestinal negative  Genitourinary negative   Musculoskeletal negative   Neurological negative   Hematology/Lymphatics negative   Endocrine negative   Allergic/Immunologic negative   Review of Systems   review of systems obtained from nurse's notes.  Physical Exam:  General/Skin/HEENT:  Skin normal   Eyes normal   ENMT normal   Head and Neck normal   Additional PE  well-developed male on continuous nasal oxygen in NAD. No cervical or supraclavicular adenopathy is appreciated lungs are clear to A&P cardiac examination shows regular rate and rhythm. Abdomen is benign.   Breasts/Resp/CV/GI/GU:  Respiratory and Thorax normal   Cardiovascular normal   Gastrointestinal normal   Genitourinary normal   MS/Neuro/Psych/Lymph:  Musculoskeletal normal   Neurological normal   Lymphatics normal   Other Results:  Radiology Results: LabUnknown:    05-Jan-15 14:43, CT Chest With Contrast  PACS Image     15-Jan-15 12:23, PET/CT Scan Lung Cancer Initial Staging  PACS Image   CT:    05-Jan-15 14:43, CT Chest With Contrast  CT Chest With Contrast   REASON FOR EXAM:    assess for lung pathology - recurrent pneumothorax  COMMENTS:       PROCEDURE: CT  - CT CHEST WITH CONTRAST  - Apr 22 2013  2:43PM     CLINICAL DATA:  Recurrent pneumothorax.    EXAM:  CT CHEST WITH CONTRAST    TECHNIQUE:  Multidetector CT imaging of the chest was performed during  intravenous contrast administration.    CONTRAST:  75 cc Isovue 300.  COMPARISON:  Chest radiograph 04/22/2013 and 03/27/2013. CT chest  05/19/2010.    FINDINGS:  No pathologically enlarged mediastinal, hilar or axillary lymph  nodes. Heart is at the upper limits of normal in size. Lipomatous  hypertrophy of the interatrial septum is incidentally noted.  Three-vessel coronary artery calcification. No pericardial effusion.  Esophagus is somewhat dilated and contains fluid, which can be seen  with dysmotility.    Tiny residual pneumothorax with left chest tube in place. Left chest  tube is seen along the major fissure. Volume loss in the posterior  left upper lobe, along the course of the leftmajor fissure, which  may be chronic.  Irregular nodule in the superior segment left lower lobe measures  1.3 x 2.3 cm (series 3, image 25) and is seen adjacent to a focal  bed of emphysema.    There is  scattered debris within the bilateral lower lobe bronchi  with bronchiectasis and mucoid impaction distally. Bronchial wall  thickening and mild volume loss appear somewhat chronic. 5 mm  subpleural nodular density in the left lower lobe (image 44) may be  due to scarring. No pleural fluid. Subcutaneous emphysema is seen  along the inferior left lateral chest wall.    Incidental imaging of the upper abdomen shows marginal irregularity  of the liver. No worrisome lytic or sclerotic lesions. Degenerative  changes are seen in the spine. Mild anterior wedging of a mid  thoracic vertebral body appears unchanged. Flowing anterior  osteophytosis in the thoracic spine. Old bilateral rib fractures.     IMPRESSION:  1. Tiny residual left pneumothorax with left chest tube in place.  Suspect a ruptured bleb as the cause of the patient's pneumothorax.  2. Irregular left lower lobe nodule, most consistent with primary  bronchogenic carcinoma. If this is primary bronchogenic carcinoma,  it is most consistent with stage IA disease. These results will be  called to the ordering clinician or representative by the  Psychologist, clinical, and communication documented in the PACS  Dashboard.  3. Debris in bilateral lower lobe bronchi with areas of mucoid  impaction, indicating aspiration or an acute infectious  bronchiolitis.  4. Marginal irregularity the liver suggests early/mild cirrhosis.  Electronically Signed    By: Lorin Picket M.D.    On: 04/22/2013 15:07         Verified By: Luretha Rued, M.D.,  Nuclear Med:    15-Jan-15 12:23, PET/CT Scan Lung Cancer Initial Staging  PET/CT Scan Lung Cancer Initial Staging   REASON FOR EXAM:    Squamous Cell Carcinoma  COMMENTS:       PROCEDURE: PET - PET/CT INIT STAGING LUNG CA  - May 02 2013 12:23PM     CLINICAL DATA:  Initial treatment strategy for lung carcinoma.  Squamous cell carcinoma.Marland Kitchen    EXAM:  NUCLEAR MEDICINE PET SKULL BASE TO  THIGH    FASTING BLOOD GLUCOSE:  Value: '88mg'$ /dl    TECHNIQUE:  12.8 mCi F-18 FDG was injected intravenously. CT data was obtained  and used for attenuation correction and anatomic localization only.  (This was not acquired as a diagnostic CT examination.) Additional  exam technical data entered on technologist worksheet.    COMPARISON:  CT ASPIRATION dated 04/23/2013; DG CHEST 2V dated  04/29/2013; CT CHEST W/ CM dated 04/22/2013; CT CHEST W/ CM dated  05/19/2010    FINDINGS:  NECK    There is a hypermetabolic nodule in the superior segment of the left  lower lobe abutting the pleural surface measuring 23 mm with SUV max  8.9 . There is second focus of hypermetabolic activity within the  pleural space of the upper medial left upper lobe measuring 9 mm on  image 85, series 4. This is fairly intense with SUV max 11.5;  however this is at the exact site of the tip of the Chest present on  CT of 02/20/2014. This is likely traumatic inflammation from the  chest tube insertion rather than a second focus of metastatic  disease or primary lung cancer.    No hypermetabolic mediastinal lymph nodes.    CHEST    No hypermetabolic mediastinal or hilar nodes. No suspicious  pulmonary nodules on the CT scan.    ABDOMEN/PELVIS  No abnormal hypermetabolic activity within the liver, pancreas,  adrenal glands, or spleen. No hypermetabolic lymph nodes in the  abdomen or pelvis. The bladder is markedly distended measuring 14 cm  x 11 cm 20 cm and extends suggests above the umbilicus. There is a  diverticulum posterior to the bladder which collects the FDG.    SKELETON    No focal hypermetabolic activity to suggest skeletal metastasis.     IMPRESSION:  1. Hypermetabolic nodule in the superior segment of the left upper  lobe is most consistent primary bronchogenic carcinoma. No evidence  of mediastinal nodal metastasis.  2. Second focus of hypermetabolic pleural thickening in the medial  aspect  of the left upper lobe. This is felt to represent iatrogenic  traumatic inflammation from the chest tube placement. Recommend  attention on follow-up.  3. Massively distended bladder extends above the umbilicus.  Recommend urology consultation as findings suggest bladder outlet  obstruction although no hydronephrosis is evident. Likely  diverticulum posterior to the bladder.      Electronically Signed    By: Suzy Bouchard M.D.    On: 05/02/2013 15:15         Verified By: Rennis Golden, M.D.,  Assessment and Plan: Impression:   stage I squamous cell carcinoma of the left upper lobe in 81 year old male. Plan:   at this time I have recommended stereotactic body radiation therapy. Would plan on delivering 5000 cGy in 5 fractions using SB RT. I have ordered a CT simulation with motion tracking for delineation of tumor volume. Risks and benefits of treatment including possible skin reaction, possible chest wall pain since it iis in close proximity to the chest wall. I have discussed the case personally with physics and we'll do treatment planning to try to minimize his much as possible dose to the chest wall. I have set the patient up next week for CT simulation as outlined above.  I would like to take this opportunity to thank you for allowing me to continue to participate in this patient's care.  CC Referral:  cc: Dr. Faith Rogue, Dr. Benita Stabile   Electronic Signatures: Baruch Gouty Roda Shutters (MD)  (Signed 22-Jan-15 13:49)  Authored: HPI, Diagnosis, Past Hx, PFSH, Allergies, Home Meds, ROS, Physical Exam, Other Results, Encounter Assessment and Plan, CC Referring Physician   Last Updated: 22-Jan-15 13:49 by Armstead Peaks (MD)

## 2014-08-09 NOTE — Discharge Summary (Signed)
PATIENT NAME:  Timothy Ali, Timothy Ali MR#:  [id number] DATE OF BIRTH:  15-Aug-1929  DATE OF ADMISSION:  04/07/2014 DATE OF DISCHARGE:  04/08/2014  PRIMARY CARE PHYSICIAN: Dr. Hall Busing.   DISCHARGE DIAGNOSES:  1.  Left-sided pneumonia.  2.  Urinary tract infection.  3.  Dehydration.  4.  Anemia.  5.  Chronic obstructive pulmonary disease.  6.  Left squamous cell cancer with mild progression of the left paraspinal tumor at T3-T4, progressive enlargement of a several mediastinal in the right hilar lymph node.  7.  Chronic respiratory failure, on 2.5 liters oxygen at home.   CONDITION: Stable.   CODE STATUS:  Full code.   HOME MEDICATIONS: Please refer to the medication reconciliation list.   DIET: Regular diet.   ACTIVITY:  As tolerated. The patient needs home oxygen 4.5 liters by nasal cannula.   FOLLOW-UP CARE:  Follow up with PCP and oncologist within 1 to 2 weeks.   REASON FOR ADMISSION: Shortness of breath and weakness.   HOSPITAL COURSE: The patient is an 81 year old Caucasian male with a history of left side lung cancer, COPD, chronic respiratory failure, presented to the ED with shortness of breath, no weakness. For detailed history and physical examination, please refer to the admission note dictated by Dr. Darvin Neighbours.  The patient's x-ray showed left-sided infiltrate. WBC 9.3, hemoglobin 11.4. 1.  A left side pneumonia. After admission, the patient was treated with Rocephin and Zithromax with nebulizer and oxygen support. The patient's symptoms have much improved. The patient wants to go home.  2.  Chronic obstructive pulmonary disease, stable.  3.  Left side squamous cell carcinoma since a CAT scan of the chest shows some mild progression in enlargement of several mediastinal in the right hilar lymph node, I advised the patient to follow up with oncologist as soon as possible.  4.  Urinary tract infection, the patient was treated with Rocephin.  The patient got Levaquin after discharge.    The patient has no complaints. His vital signs are stable. The patient was discharged on 04/08/2014.   I discussed the patient's discharge plan with the patient, nurse, and case manager.   TIME SPENT: About 36 minutes.  ____________________________ Demetrios Loll, MD qc:at D: 04/10/2014 08:34:00 ET T: 04/10/2014 11:24:46 ET JOB#: [id number]  cc: Demetrios Loll, MD, <Dictator> Demetrios Loll MD ELECTRONICALLY SIGNED 04/16/2014 10:50

## 2014-08-09 NOTE — Discharge Summary (Signed)
PATIENT NAME:  Timothy Ali, Timothy Ali MR#:  [id number] DATE OF BIRTH:  10-19-1929  DATE OF ADMISSION:  04/22/2013 DATE OF DISCHARGE:  04/27/2013  DISCHARGE DIAGNOSES:   1.  Spontaneous left pneumothorax status post tube thoracostomy.  2.  Left lower lung squamous cell cancer,  3.  Chronic obstructive pulmonary disease.  4.  Anxiety.  5.  Depression.  6.  History of hemorrhoids.  7.  History of left arm surgery.  8.  History of left neck surgery.   DISCHARGE MEDICATIONS: As follows:   1.  Nexium 40 mg p.o. daily.  2.  Lipitor 40 mg p.o. at bedtime.  3.  Ativan 1 mg p.o. q. 3 hours p.r.n. anxiety.  4.   Dulera  100/5, 2 puffs b.i.d. 5.  Spiriva 18 mcg one, 1 to 2 times p.r.n. shortness of breath.  6.  Effexor 75 mg p.o. daily.  7.  Flomax 0.4 mg p.o. daily.  8.  Seroquel 300 mg p.o. b.i.d.  9.  Ventolin inhaler 2 puffs q.i.d. p.r.n. shortness of breath.  10.  Terazosin 10 mg p.o. at bedtime.  11.  Percocet 1 to 2 tabs p.r.n. pain.   INDICATION FOR ADMISSION: [name] is a pleasant 81 year old male with history of spontaneous left pneumothorax who presented with a large left pneumothorax. He was admitted for management of large pneumothorax.   HOSPITAL COURSE: [name] was admitted from the ED after tube thoracostomy was placed. He underwent a CT scan which showed a left lower lobe nodule which underwent CT-guided biopsy. Throughout the remainder of hospital course, the patient's chest tube was removed, once it was sure that the previous injury which caused spontaneous pneumothorax had resolved.  At the time of discharge he was breathing well, had no obvious pneumothorax and was taking good p.o. with p.o. pain control.   DISCHARGE INSTRUCTIONS: Mr. Varnell is to call or return to the ED if he has increased shortness of breath or chest pain. He is to follow up with Dr. Genevive Bi in approximately 3 to 4 days for further management of lung cancer.     ____________________________ Glena Norfolk. Karol Skarzynski, MD cal:cc D: 04/30/2013 16:43:17 ET T: 04/30/2013 18:09:22 ET JOB#: [id number]  cc: Harrell Gave A. Brannon Levene, MD, <Dictator> Floyde Parkins MD ELECTRONICALLY SIGNED 05/01/2013 17:24

## 2014-08-09 NOTE — Consult Note (Signed)
PATIENT NAME:  Timothy Ali, NOHR MR#:  [id number] DATE OF BIRTH:  04-17-1930  DATE OF CONSULTATION:  04/22/2013  REFERRING PHYSICIAN: Chauncey Reading, MD   CONSULTING PHYSICIAN:  Lew Dawes. Berlene Dixson, MD  REASON FOR CONSULTATION: Management of recurrent left-sided spontaneous pneumothorax.   I have personally seen and examined Mr. Timothy Ali. I have reviewed his chest x-rays. I have discussed his care with Dr. Burt Knack and Dr. Rexene Edison. I spent approximately 45 minutes with the patient reviewing his history and physical and discussing his plan of care.   HISTORY OF PRESENT ILLNESS: Mr. Timothy Ali was very pleasant 81 year old gentleman with a history of COPD who is on home oxygen as needed. He was admitted to the hospital in 2012 with a left-sided spontaneous pneumothorax. At that time, he had a chest tube inserted and he did well although he did have extensive subcutaneous emphysema which ultimately resolved and he was discharged to home.   He states for the last several weeks, he has been having significant cough and has the flu. He has been treated with ciprofloxacin as well as Mucomyst and was awoken from sleep early this morning when he experienced acute onset of left-sided chest pain and shortness of breath. Because of his prior history of a spontaneous pneumothorax, he was brought to the Emergency Department by EMS.   While in the Emergency Department, he had a chest x-ray done which showed a complete collapse of his left lung. He had a chest tube inserted with prompt re-expansion of his left lung. There is no current air leak.   PAST MEDICAL HISTORY: Significant for COPD on home oxygen as needed. He states he has not used his oxygen in the last 6 months except for this morning. He does have a history of anxiety, depression, hemorrhoids, neck surgery and a spontaneous pneumothorax.   SOCIAL HISTORY: The patient does not drink or smoke. He is currently retired. He stopped smoking about 20 years ago  and stopped drinking prior to that time.   ALLERGIES: PENICILLIN.   CURRENT MEDICATIONS: Include Phenytoin, Terazosin, Spiriva, Seroquel, Nexium, Lipitor, Flomax, Effexor, Ativan, Dulera, and vitamins.   REVIEW OF SYSTEMS: As per history of present illness and all other review of systems were asked and were negative.   PHYSICAL EXAMINATION:  GENERAL: A pleasant, well-developed, well-nourished gentleman. He was inspiring nasal cannula oxygen. He was awake, alert, and oriented. HEENT: Head normocephalic, atraumatic. There was no scleral icterus.  NECK: Supple without thyromegaly or adenopathy. There were no carotid bruits. There were no palpable supraclavicular or cervical nodes.  LUNGS: He had equal breath sounds bilaterally.  HEART: His heart was regular. There were no murmurs.  ABDOMEN: Soft, nontender, and mildly distended. There was no clubbing or cyanosis. He did have a reddish macular rash on both of his lower extremities just above the ankle. He states he has been seeing a physician for this and has been applying some creams. He has no cyanosis or edema.   I have independently reviewed the patient's chest x-rays. There was a tension pneumothorax present earlier this morning which has now resolved with placement of a chest tube. I have independently reviewed these films and it appears that his lung is now fully expanded. I do not appreciate an air leak on interrogation of the system.   I reviewed with the patient the indications and risks of talc pleurodesis. He does have a large-bore chest tube in place which we can use for a bedside talc slurry. I explained to  him I would like to obtain a CT scan to rule out any underlying pathology given his protracted smoking history. We will obtain the CT scan today and I will discuss those results with him prior to any pleurodesis. The patient is agreeable to this plan.   Thank you very much for allowing me to participate in his care.      ____________________________ Lew Dawes. Genevive Bi, MD teo:np D: 04/22/2013 14:59:54 ET T: 04/22/2013 15:17:46 ET JOB#: [id number]  cc: Christia Reading E. Genevive Bi, MD, <Dictator> Louis Matte MD ELECTRONICALLY SIGNED 04/23/2013 14:41

## 2014-08-09 NOTE — Consult Note (Signed)
PATIENT NAME:  Timothy Ali, Timothy Ali MR#:  [id number] DATE OF BIRTH:  10-May-1929  DATE OF CONSULTATION:  04/24/2013  REFERRING PHYSICIAN:  Dr. Genevive Bi    CONSULTING PHYSICIAN:  Khamani Daniely R. Noya Santarelli, MD  REASON FOR CONSULTATION: Agitation.   HISTORY OF PRESENTING ILLNESS: An 81 year old Caucasian male patient with history of depression, psychosis, COPD, recurrent pneumothorax presented to the hospital with left side chest and abdominal pain and was found to have pneumothorax. He had a chest tube placed. Initially, the plan was to do talc pleurodesis, but presently the plan is to remove the chest tube depending on the progress in a day or 2 and discharge home which I have discussed with Dr. Genevive Bi. The patient since yesterday has been agitated. Has tried to pull his chest tube out, which was replaced earlier. Also has been restless trying to get out of bed. Both his sisters are at bedside and mentioned that he does not have any dementia,  is very sharp. Lives alone, drives on his own. They do mention that he has had problems with delusions at home. He sees a psychiatrist, although the name of the psychiatrist is not available at this time. No recent change in medications. The patient is on 300 mg Seroquel b.i.d. at home, but here in the hospital, he is only on 300 once a day in the morning. He mentioned that he has not had enough sleep in the hospital. He is on Ativan 1 mg 3 times a day. The patient has had multiple admissions to behavioral health unit here with psychosis in the past. He denies any suicidal ideation. Does not have any thoughts of harming self or others. He is oriented to person and place but not to time.   PAST MEDICAL HISTORY: 1.  Chronic obstructive pulmonary disease.  2.  Recurrent pneumothorax.  3.  Anxiety.  4.  Depression.  5.  Psychosis.  6.  Hemorrhoids.  7.  Left arm surgery.  8.  Left neck surgery.   FAMILY HISTORY: No CVA, diabetes, hypertension.   SOCIAL HISTORY: He lives alone,  independently. He used to smoke but quit many years back. His wife passed away 5 years back.   CODE STATUS:  FULL CODE.    ALLERGIES: PENICILLIN ALTHOUGH THE REACTION IS UNKNOWN.   HOME MEDICATIONS: Include:  1.  Seroquel 300 mg oral 2 times a day.  2.  Ativan 1 mg oral 3 times a day.  3.  Dulera 100 mcg/5 mcg 2 puffs inhaled 2 times a day.  4.  Effexor 75 mg oral once a day. 5.  Flomax 0.4 mg oral once a day.  6.  Lipitor 40 mg daily.  7.  Nexium 40 mg daily.  8.  Spiriva 18 mcg inhaled once a day.  9.  Terazosin 10 mg oral once a day.  10.  Ventolin HFA 2 puffs inhaled 4 times a day as needed.   REVIEW OF SYSTEMS:  Complete list could not be obtained as the patient refuses to answer a few questions. He is confused.   PHYSICAL EXAMINATION: VITAL SIGNS: Temperature 98.4, pulse of 91, blood pressure 173/88, saturating 95% on 2 liters oxygen.  GENERAL: Obese, Caucasian male patient lying in bed, is restless.  PSYCHIATRIC: Alert and oriented to person and place but not to time, seems to have some tangential thoughts. Selective answering of questions.  HEENT: Atraumatic, normocephalic. Oral mucosa moist and pink. External ears and nose normal. No pallor. No icterus. Pupils bilaterally equal and react  to light.  NECK: Supple. No thyromegaly. No palpable lymph nodes. Trachea midline. No carotid bruits or JVD.  CARDIOVASCULAR: S1, S2 without any murmurs. Peripheral pulses 2+. No edema.  RESPIRATORY: Good air entry on both sides. Chest tube in place on the left side. No wheezing bilaterally. Normal work of breathing.  GASTROINTESTINAL: Soft abdomen, nontender. Bowel sounds present. No organomegaly palpable.  GENITOURINARY: No CVA tenderness or bladder distention.  SKIN: Warm and dry. No petechiae, rash, ulcers.  MUSCULOSKELETAL: No joint swelling, redness, effusion of the large joints. Normal muscle tone.  NEUROLOGICAL: Motor strength 5 out of 5 in upper and lower extremities. Sensation  remains intact all over.  Babinski's is downgoing. Cranial nerves II through XII intact.  LYMPHATIC: No cervical lymphadenopathy.   LABORATORY STUDIES: Show glucose of 112, BUN 24, creatinine 0.94. Troponin less than 0.02. WBC 7, hemoglobin 12.2, platelets of 198. EKG showed sinus tachycardia, nonspecific T wave changes.   CT scan of the chest showed residual left pneumothorax with chest tube in place. Irregular left lower lobe nodule consistent with bronchogenic carcinoma. This was biopsied with CT-guided biopsy.   Chest x-ray, portable done earlier today shows improving left basilar opacification. Left-sided chest tube in adequate position.   ASSESSMENT AND PLAN: 1.  Psychosis with inpatient delirium. The patient has had delusions at home for a long time, sees a  psychiatrist.  We will put him back on his home dose of Seroquel increased from 300 to 300 b.i.d. We will put him on IV Haldol p.r.n. Continue the Ativan and Effexor. The patient will be on fall precautions. If no improvement by tomorrow, we will need to consult psychiatry for further input with the case.  2.  Chronic obstructive pulmonary disease exacerbation. The patient does have wheezing. I would not start any steroids at this time as this could worsen his delusion. We will put him on DuoNeb nebulizers. Continue the [facility] the patient is on and the incentive spirometer.  3.  Pneumothorax management per primary team.  4.  Deep vein thrombosis prophylaxis. The patient is on heparin.   TIME SPENT ON THIS CASE TODAY: 45 minutes.    ____________________________ Leia Alf Libertie Hausler, MD srs:dp D: 04/24/2013 14:37:10 ET T: 04/24/2013 14:52:35 ET JOB#: [id number]  cc: Alveta Heimlich R. Mccartney Brucks, MD, <Dictator> Christopher A. Rexene Edison, [place] Genevive Bi, MD Leona Carry. Hall Busing, MD Neita Carp MD ELECTRONICALLY SIGNED 05/07/2013 18:09

## 2014-08-09 NOTE — Op Note (Signed)
PATIENT NAME:  Timothy Ali, Timothy Ali MR#:  [id number] DATE OF BIRTH:  12-31-1929  DATE OF PROCEDURE:  04/22/2013  PREOPERATIVE DIAGNOSIS: Left spontaneous recurrent pneumothorax.   POSTOPERATIVE DIAGNOSIS: Left spontaneous recurrent pneumothorax.   PROCEDURE PERFORMED: Left tube thoracostomy with 28-French chest tube.   SURGEON: Marlyce Huge, MD  ESTIMATED BLOOD LOSS: 5 mL.   COMPLICATIONS: None.   SPECIMENS: None.   ANESTHESIA: 100 mcg of fentanyl and 2 mcg of Versed.   FINDINGS: None.   INDICATION FOR SURGERY: Timothy Ali is a pleasant 81 year old male with history of left spontaneous pneumothorax who presented with shortness of breath and left chest pain. A chest x-ray showed a spontaneous pneumothorax. He thus underwent tube thoracostomy.   DETAILS OF PROCEDURE: Informed consent was obtained. Mr. Santana was laid supine on the OR stretcher. The left chest was prepped and draped in standard surgical fashion. A timeout was then performed correctly identifying the patient's name, operative site, and procedure to be performed. The patient was then given 50 mcg of fentanyl and 2 mcg of Versed. His left chest was infiltrated at the anterior axillary line below the left nipple with 30 mL of 1% lidocaine with epinephrine. I then proceeded to make an incision over his rib. This was deepened down to the rib. I then used a Kelly clamp to dissect the space above his rib and to enter his pleural cavity. There was a large rush of air which indicated some sort of tension component. I then placed a 28-French chest tube, which was sutured in place with a #1 silk suture. I then placed a sterile dressing over the tube and hooked it up to suction. There was initially an air leak, but this had resolved after pneumothorax was evacuated. A sterile dressing was then placed over the wound. The patient then had an x-ray which showed near complete expansion of lung with chest tube in good position and without  kinking. The patient was then admitted for further management of recurrent pneumothorax. There were no immediate complications. Needle, sponge, and instrument counts were correct at the end of the procedure.  ____________________________ Glena Norfolk. Ameliyah Sarno, MD cal:sb D: 04/22/2013 08:37:34 ET T: 04/22/2013 09:30:50 ET JOB#: [id number]  cc: Harrell Gave A. Zebedee Segundo, MD, <Dictator> Floyde Parkins MD ELECTRONICALLY SIGNED 04/30/2013 16:44

## 2014-08-12 ENCOUNTER — Ambulatory Visit
Admit: 2014-08-12 | Disposition: A | Discharge status: Home / Self Care | Payer: Self-pay | Attending: Internal Medicine | Admitting: Internal Medicine

## 2014-08-13 NOTE — H&P (Signed)
PATIENT NAME:  Timothy Ali, Timothy Ali MR#:  [id number] DATE OF BIRTH:  10-02-1929  DATE OF ADMISSION:  04/07/2014  PRIMARY CARE PHYSICIAN: Sharlet Salina C. Hall Busing, MD   CHIEF COMPLAINT: Shortness of breath, weakness.   HISTORY OF PRESENTING ILLNESS: An 81 year old Caucasian male patient with history of left-sided lung cancer on radiation therapy, COPD, chronic respiratory failure, hypertension, presents to the Emergency Room complaining of 2 days of worsening shortness of breath, cough which is dry. The patient has been found to have sepsis, a left-sided pneumonia and is being admitted to the hospitalist service. He does not have any orthopnea, lower extremity edema. No sick contacts. No recent antibiotic use. No recent hospital stay. The patient is not on chemotherapy. He is febrile, tachycardic.   PAST MEDICAL HISTORY:  1.  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  2.  COPD.  3.  Chronic respiratory failure, on 2.5 liters oxygen at home.  4.  Bipolar disorder.  5.  Hyperlipidemia.  6.  Benign prostatic hypertrophy.  7.  Insomnia.   HOME MEDICATIONS:  1.  Seroquel 300 mg 2 times a day.  2.  Effexor 75 mg a day. 4.  Ativan 1 mg 3 times a day as needed.  5.  Flomax 0.4 mg daily.  6.  Lipitor 40 mg daily. 7.  Spiriva 18 mcg daily. 8.  Terazosin 10 mg daily.  9.  Ventolin HFA 90 mcg 2 puffs 4 times a day.  10.  Zolpidem 10 mg daily as needed at bedtime.  PAST SURGICAL HISTORY: Left tube thoracostomy for management of spontaneous pneumothorax. Left arm surgery and left neck surgery.   ALLERGIES: PENICILLIN.   FAMILY HISTORY: Hypertension. No lung cancer in the family.   SOCIAL HISTORY: The patient is a former smoker, quit 15 years back. Ambulates on his own. Lives alone.   REVIEW OF SYSTEMS:  CONSTITUTIONAL: Complains of fatigue, weakness.  EYES: No blurred vision, pain. HENT: No tinnitus, ear pain, hearing loss. RESPIRATORY: Has cough, shortness of breath. No  sputum.  CARDIOVASCULAR: No chest pain, orthopnea, edema.  GASTROINTESTINAL: No nausea, vomiting, diarrhea, abdominal pain.  GENITOURINARY: No dysuria, hematuria or frequency. ENDOCRINE: No polyuria, nocturia or thyroid problems.  HEMATOLOGIC AND LYMPHATIC: No anemia, easy bruising, bleeding.  INTEGUMENTARY: No acne, rash, lesion.  MUSCULOSKELETAL: No back pain or arthritis. NEUROLOGICAL: No focal numbness, weakness, seizure.   PHYSICAL EXAMINATION:  VITAL SIGNS: Shows temperature 100.5, pulse of 115, respirations 26, blood pressure 123/79, saturating 92% on 2 liters oxygen.  GENERAL: Moderately built Caucasian male patient lying in bed in respiratory distress.  PSYCHIATRIC: Alert, oriented x 3, anxious.  HEENT: Atraumatic, normocephalic. Oral mucosa moist and pink. External ears and nose normal. No pallor. No icterus. Pupils bilaterally equal and reactive to light.  NECK: Supple. No thyromegaly. No palpable lymph nodes. Trachea midline. No carotid bruit or JVD.  CARDIOVASCULAR: S1, S2, tachycardic without any murmurs.  RESPIRATORY: Increased work of breathing with left-sided crackles.  GASTROINTESTINAL: Soft abdomen, nontender. Bowel sounds present. No organomegaly palpable.  SKIN: Warm and dry. No petechiae, rash, ulcers.  MUSCULOSKELETAL: No joint swelling, redness, effusion of the large joints. Normal muscle tone.  NEUROLOGICAL: Motor strength 5/5 in upper and lower extremities. Sensation is intact all over.  LYMPHATIC: No cervical lymphadenopathy.   LABORATORY DATA: Glucose 157, BUN 27, creatinine 1.1, sodium 138, potassium 3.5, chloride 104, bicarbonate 26, GFR greater than 60. AST, ALT, alkaline phosphatase, bilirubin normal. Troponin less than 0.02.   WBC  9.3, hemoglobin 11.4, platelets 145,000, INR 1.2, lactic acid 1.1.   Chest x-ray shows left-sided infiltrates.   EKG shows normal sinus rhythm, sinus tachycardia, nothing acute.   ASSESSMENT AND PLAN:  1.  Left-sided  pneumonia in a patient with squamous cell carcinoma of the lung. The patient also has sepsis. The patient will be admitted as inpatient, started on IV ceftriaxone and azithromycin. DuoNebs scheduled along with oxygen support, IV fluids 1 liter bolus and continuous IV fluids. Monitor I's and O's. We will also get a CT of the chest with contrast, which he is supposed to get tomorrow as outpatient to see for any worsening of cancer or any post obstructive pneumonia.  2.  Chronic obstructive pulmonary disease. The patient does not have any wheezing at this time. We will continue his home inhalers along with scheduled nebulizers. No steroids needed.  3.  Left-sided squamous cell carcinoma. The patient has follow up with radiation oncology in 6 months.  4.  Mild thrombocytopenia is chronic and stable.  5.  Deep venous thrombosis prophylaxis with Lovenox.  6. UTI - UA shows bacteria and WBC. On ceftriaxone.  CODE STATUS: Full code.   TIME SPENT TODAY ON THIS CASE: Forty-five minutes.   ____________________________ Leia Alf Korrine Sicard, MD srs:TT D: 04/07/2014 17:04:40 ET T: 04/07/2014 17:28:07 ET JOB#: [id number]  cc: Alveta Heimlich R. Luka Stohr, MD, <Dictator> Leona Carry. Hall Busing, MD Reynaldo Minium, MD Neita Carp MD ELECTRONICALLY SIGNED 05/02/2014 12:46

## 2014-09-29 ENCOUNTER — Other Ambulatory Visit: Payer: Self-pay | Admitting: *Deleted

## 2014-09-29 ENCOUNTER — Encounter: Payer: Self-pay | Admitting: Radiation Oncology

## 2014-09-29 ENCOUNTER — Ambulatory Visit
Admission: RE | Admit: 2014-09-29 | Discharge: 2014-09-29 | Disposition: A | Discharge status: Home / Self Care | Payer: Medicare HMO | Source: Ambulatory Visit | Attending: Radiation Oncology | Admitting: Radiation Oncology

## 2014-09-29 VITALS — BP 143/78 | HR 89 | Temp 95.2°F | Resp 22 | Wt 157.4 lb

## 2014-09-29 DIAGNOSIS — C3492 Malignant neoplasm of unspecified part of left bronchus or lung: Secondary | ICD-10-CM

## 2014-09-29 DIAGNOSIS — Z85118 Personal history of other malignant neoplasm of bronchus and lung: Secondary | ICD-10-CM

## 2014-09-29 NOTE — Progress Notes (Signed)
Radiation Oncology Follow up Note  Name: Timothy Ali   Date:   09/29/2014 MRN:  [id number] DOB: Apr 14, 1930    This 81 y.o. male presents to the clinic today for follow-up for stage IA squamous cell carcinoma the left lung T1 N0 M0 status post SB RT 15 months prior.  REFERRING PROVIDER: No ref. provider found  HPI: Patient is a 81 year old male now seen out 15 months having completed SB RT for a T1 lesion of the left lung superior segment left upper lobe. He is seen today in routine follow-up is doing fairly well still has problems with shortness of breath and dyspnea on exertion which is been stable and not worsened since his radiation therapy. He had a CT scan back in December 2015 showing mild progression of the left paraspinal tumor at T3 for a questionable enlargement of several mediastinal lymph nodes. He specifically denies cough hemoptysis..  COMPLICATIONS OF TREATMENT: none  FOLLOW UP COMPLIANCE: keeps appointments   PHYSICAL EXAM:  BP 143/78 mmHg  Pulse 89  Temp(Src) 95.2 F (35.1 C)  Resp 22  Wt 157 lb 6.5 oz (71.4 kg) Well-developed well-nourished patient in NAD. HEENT reveals PERLA, EOMI, discs not visualized.  Oral cavity is clear. No oral mucosal lesions are identified. Neck is clear without evidence of cervical or supraclavicular adenopathy. Lungs are clear to A&P. Cardiac examination is essentially unremarkable with regular rate and rhythm without murmur rub or thrill. Abdomen is benign with no organomegaly or masses noted. Motor sensory and DTR levels are equal and symmetric in the upper and lower extremities. Cranial nerves II through XII are grossly intact. Proprioception is intact. No peripheral adenopathy or edema is identified. No motor or sensory levels are noted. Crude visual fields are within normal range.   RADIOLOGY RESULTS: CT scan from December reviewed  PLAN: At this time patient is stable continues with similar pulmonary symptoms as before his SB RT. I've  ordered another CT scan with contrast and will report that when available. Otherwise I've asked to see him back in 6 months for follow-up. Patient knows to call sooner with any concerns. He has had an excellent response by CT criteria in the left upper lobe.  I would like to take this opportunity for allowing me to participate in the care of your patient.Armstead Peaks., MD

## 2014-10-09 ENCOUNTER — Other Ambulatory Visit: Payer: Self-pay | Admitting: Psychiatry

## 2014-10-09 NOTE — Telephone Encounter (Signed)
rx faxed to pharmacy

## 2014-10-13 ENCOUNTER — Ambulatory Visit
Admission: RE | Admit: 2014-10-13 | Discharge: 2014-10-13 | Disposition: A | Discharge status: Home / Self Care | Payer: Medicare HMO | Source: Ambulatory Visit | Attending: Radiation Oncology | Admitting: Radiation Oncology

## 2014-10-13 DIAGNOSIS — Z85118 Personal history of other malignant neoplasm of bronchus and lung: Secondary | ICD-10-CM | POA: Insufficient documentation

## 2014-10-13 DIAGNOSIS — M489 Spondylopathy, unspecified: Secondary | ICD-10-CM | POA: Diagnosis not present

## 2014-10-13 DIAGNOSIS — N281 Cyst of kidney, acquired: Secondary | ICD-10-CM | POA: Diagnosis not present

## 2014-10-13 DIAGNOSIS — M810 Age-related osteoporosis without current pathological fracture: Secondary | ICD-10-CM | POA: Insufficient documentation

## 2014-10-13 DIAGNOSIS — I2510 Atherosclerotic heart disease of native coronary artery without angina pectoris: Secondary | ICD-10-CM | POA: Diagnosis not present

## 2014-10-13 HISTORY — DX: Unspecified asthma, uncomplicated: J45.909

## 2014-10-13 HISTORY — DX: Malignant (primary) neoplasm, unspecified: C80.1

## 2014-10-13 MED ORDER — IOHEXOL 300 MG/ML  SOLN
75.0000 mL | Freq: Once | INTRAMUSCULAR | Status: AC | PRN
  Administered 2014-10-13: 75 mL via INTRAVENOUS

## 2014-10-15 ENCOUNTER — Encounter: Payer: Self-pay | Admitting: Psychiatry

## 2014-10-15 ENCOUNTER — Ambulatory Visit (INDEPENDENT_AMBULATORY_CARE_PROVIDER_SITE_OTHER): Payer: Medicare HMO | Admitting: Psychiatry

## 2014-10-15 VITALS — BP 122/68 | HR 91 | Temp 97.4°F | Ht 67.0 in | Wt 156.6 lb

## 2014-10-15 DIAGNOSIS — F333 Major depressive disorder, recurrent, severe with psychotic symptoms: Secondary | ICD-10-CM | POA: Diagnosis not present

## 2014-10-15 MED ORDER — LORAZEPAM 1 MG PO TABS: ORAL_TABLET | ORAL | Status: DC

## 2014-10-15 MED ORDER — VENLAFAXINE HCL ER 150 MG PO CP24: 150.0000 mg | ORAL_CAPSULE | Freq: Every day | ORAL | Status: DC

## 2014-10-15 MED ORDER — ZOLPIDEM TARTRATE ER 12.5 MG PO TBCR: 12.5000 mg | EXTENDED_RELEASE_TABLET | Freq: Every evening | ORAL | Status: DC | PRN

## 2014-10-15 MED ORDER — QUETIAPINE FUMARATE ER 300 MG PO TB24: 300.0000 mg | ORAL_TABLET | Freq: Every day | ORAL | Status: DC

## 2014-10-15 NOTE — Progress Notes (Addendum)
[facility] MD/PA/NP OP Progress Note  10/15/2014 11:59 AM Timothy Ali  MRN:  [id number]  Subjective:  A she indicates that things are going largely the same. He states his only issue right now is he is not sleeping as well as he would like. He states he sleeping about 5 hours and would really like to sleep about 6 or 7 hours per night. He states he is not feeling much depression. He does discuss this long-standing belief that people in his complex R monitoring him and saying bad things about him. He states he has no desire to retaliate or harm anyone but just wants to move to another apartment complex. He indicates he has the money to do it but simply does not want to spend the money to put down a deposit on a new place and pay for the moving expenses.  I spent some time discussing with him being on his lorazepam and my concerns that it could cause him to be unsteady. Patient seemed insisting was not having any issues with it. He did agree to decrease his dose of 1 mg 4 times a day to a total of 3.5 mg daily. We'll continue to work with patient on this issue. Chief Complaint:  Visit Diagnosis:  No diagnosis found.  Past Medical History:  Past Medical History  Diagnosis Date  . Major depressive disorder, recurrent, severe with psychotic features   . Persistent disorder of initiating or maintaining sleep   . Generalized anxiety disorder   . Major depressive disorder, recurrent, severe with psychotic features   . Persistent disorder of initiating or maintaining sleep   . Asthma   . Cancer     Left lung    Past Surgical History  Procedure Laterality Date  . Hemorroidectomy    . Lung surgery     Family History:  Family History  Problem Relation Age of Onset  . COPD Mother   . Lung cancer Father    Social History:  History   Social History  . Marital Status: Widowed    Spouse Name: N/A  . Number of Children: N/A  . Years of Education: N/A   Social History Main Topics  . Smoking  status: Former Research scientist (life sciences)  . Smokeless tobacco: Never Used     Comment: quit 18 years ago  . Alcohol Use: No  . Drug Use: No  . Sexual Activity: No   Other Topics Concern  . None   Social History Narrative   Additional History:   Assessment:   Musculoskeletal: Strength & Muscle Tone: within normal limits Gait & Station: normal Patient leans: N/A  Psychiatric Specialty Exam: HPI  Review of Systems  Psychiatric/Behavioral: Negative for depression, suicidal ideas, hallucinations, memory loss and substance abuse. The patient has insomnia. The patient is not nervous/anxious.     Blood pressure 122/68, pulse 91, temperature 97.4 F (36.3 C), temperature source Tympanic, height '5\' 7"'$  (1.702 m), weight 156 lb 9.6 oz (71.033 kg), SpO2 89 %.Body mass index is 24.52 kg/(m^2).  General Appearance: Neat and Well Groomed  Eye Contact:  Good  Speech:  Clear and Coherent and Normal Rate  Volume:  Normal  Mood:  I'm doing good on my meds  Affect:  Constricted  Thought Process:  Linear and Logical  Orientation:  Full (Time, Place, and Person)  Thought Content:  Paranoid Ideation  Suicidal Thoughts:  No  Homicidal Thoughts:  No  Memory:  Immediate;   Good Recent;   Good Remote;  Good  Judgement:  Good  Insight:  Fair  Psychomotor Activity:  Negative  Concentration:  Good  Recall:  Good  Fund of Knowledge: Good  Language: Good  Akathisia:  Negative  Handed:  Right unknown  AIMS (if indicated):  Not done  Assets:  Desire for Improvement  ADL's:  Intact  Cognition: WNL  Sleep:  5 hour per night   Is the patient at risk to self?  No. Has the patient been a risk to self in the past 6 months?  No. Has the patient been a risk to self within the distant past?  No. Is the patient a risk to others?  No. Has the patient been a risk to others in the past 6 months?  No. Has the patient been a risk to others within the distant past?  No.  Current Medications: Current Outpatient  Prescriptions  Medication Sig Dispense Refill  . albuterol (PROVENTIL HFA;VENTOLIN HFA) 108 (90 BASE) MCG/ACT inhaler Inhale into the lungs QID.    Marland Kitchen atorvastatin (LIPITOR) 40 MG tablet Take 1 tablet by mouth daily.    Marland Kitchen econazole nitrate 1 % cream     . esomeprazole (NEXIUM) 40 MG packet Take 1 packet by mouth daily.    Marland Kitchen LORazepam (ATIVAN) 1 MG tablet Take 1 tablet 3 times a day and one half a tablet once a day. 315 tablet 0  . mometasone-formoterol (DULERA) 200-5 MCG/ACT AERO Inhale into the lungs QID.    Marland Kitchen QUEtiapine (SEROQUEL XR) 300 MG 24 hr tablet Take 1 tablet (300 mg total) by mouth at bedtime. 90 tablet 0  . tamsulosin (FLOMAX) 0.4 MG CAPS capsule Take 1 capsule by mouth daily.    Marland Kitchen tiotropium (SPIRIVA) 18 MCG inhalation capsule Place into inhaler and inhale daily.    Marland Kitchen triamcinolone cream (KENALOG) 0.1 %     . venlafaxine XR (EFFEXOR-XR) 150 MG 24 hr capsule Take 1 capsule (150 mg total) by mouth daily. 90 capsule 0  . zolpidem (AMBIEN) 10 MG tablet Take 1 tablet by mouth at bedtime.    Marland Kitchen QUEtiapine Fumarate (SEROQUEL XR) 150 MG 24 hr tablet Take 1 tablet by mouth 2 (two) times daily.    Marland Kitchen venlafaxine XR (EFFEXOR-XR) 75 MG 24 hr capsule Take 1 capsule by mouth daily.    Marland Kitchen zolpidem (AMBIEN CR) 12.5 MG CR tablet Take 1 tablet (12.5 mg total) by mouth at bedtime as needed for sleep. 90 tablet 0   No current facility-administered medications for this visit.    Medical Decision Making:  Established Problem, Stable/Improving (1), Review of Medication Regimen & Side Effects (2) and Review of New Medication or Change in Dosage (2)  Treatment Plan Summary:Medication management and Plan We'll decrease his lorazepam from 1 mg 4 times a day to 1 mg 3 times a day and a half a milligram once a day. We'll continue him on his Effexor XR at 150 mg daily, we'll continue the Seroquel XR 300 mg at bedtime. We'll change him over from Ambien 10 mg at bedtime to Ambien CR 12.5 g at bedtime. Patient will  follow up in 1 month. Patient signed a written release for me to contact the [facility] police as he has informed this Probation officer they're aware of the allegations he has made about people in his living situation.   Patient was very eager for writer to investigate his claims of neighbors following him and monitoring him. Patient signed a release of information for the [facility]  and indicated that the chief they are Kathi Ludwig would be familiar with his issues. Writer contacted the [employer] on [phone]. I spoke with Tammy in the records department. She search patient's main and address for any types of activity are reports and was unable to find anything. In regards to the chief she stated that the chief when unlikely have any information without any records indicating there had been issues.  Faith Rogue 10/15/2014, 11:59 AM

## 2014-10-21 ENCOUNTER — Ambulatory Visit: Payer: Self-pay | Admitting: Psychiatry

## 2014-10-21 ENCOUNTER — Telehealth: Payer: Self-pay

## 2014-10-21 NOTE — Telephone Encounter (Signed)
pt was denied prior authorization on zolpidem tart er 12.'5mg'$  per the insurance pt needs to try trazonde , temazepam '15mg'$  or temazepam '30mg'$ , zaleplon capsule for insurance approval.

## 2014-10-22 ENCOUNTER — Telehealth: Payer: Self-pay | Admitting: Obstetrics and Gynecology

## 2014-10-22 ENCOUNTER — Telehealth: Payer: Self-pay | Admitting: *Deleted

## 2014-10-22 NOTE — Telephone Encounter (Signed)
Dr. Baruch Gouty reviewed the CT scan and asked to see Mr. Timothy Ali next week along with a consultation appointment for Dr. Oliva Bustard.  Mr. Timothy Ali notified of appointments with Dr. Baruch Gouty and Dr. Oliva Bustard on Wednesday October 29, 2014.

## 2014-10-22 NOTE — Telephone Encounter (Signed)
Mr. Timothy Ali called wanting to see if he could switch back to his last style of catheters. The new kind (orange cap) is much more "messier" than the last (green cap). Best contact # is [phone],cell, or [phone].  10/22/14 MAF

## 2014-10-22 NOTE — Telephone Encounter (Signed)
Spoke with patient and he will contact [employer] to see if his ins will pay for the coloplast speedicath, this is what his script was written for but not what was being sent. This may be an ins issue and they may be able to send samples he will call [employer] to find out

## 2014-10-23 ENCOUNTER — Inpatient Hospital Stay
Admission: EM | Admit: 2014-10-23 | Discharge: 2014-10-25 | DRG: 180 | Disposition: A | Discharge status: Home / Self Care | Payer: Medicare HMO | Attending: Internal Medicine | Admitting: Internal Medicine

## 2014-10-23 ENCOUNTER — Emergency Department: Payer: Medicare HMO

## 2014-10-23 DIAGNOSIS — G4700 Insomnia, unspecified: Secondary | ICD-10-CM | POA: Diagnosis present

## 2014-10-23 DIAGNOSIS — J9621 Acute and chronic respiratory failure with hypoxia: Secondary | ICD-10-CM | POA: Diagnosis present

## 2014-10-23 DIAGNOSIS — J45909 Unspecified asthma, uncomplicated: Secondary | ICD-10-CM | POA: Diagnosis present

## 2014-10-23 DIAGNOSIS — C3402 Malignant neoplasm of left main bronchus: Secondary | ICD-10-CM | POA: Diagnosis not present

## 2014-10-23 DIAGNOSIS — F419 Anxiety disorder, unspecified: Secondary | ICD-10-CM | POA: Diagnosis present

## 2014-10-23 DIAGNOSIS — J189 Pneumonia, unspecified organism: Secondary | ICD-10-CM | POA: Diagnosis present

## 2014-10-23 DIAGNOSIS — F333 Major depressive disorder, recurrent, severe with psychotic symptoms: Secondary | ICD-10-CM | POA: Diagnosis present

## 2014-10-23 DIAGNOSIS — C7951 Secondary malignant neoplasm of bone: Secondary | ICD-10-CM | POA: Diagnosis present

## 2014-10-23 DIAGNOSIS — R0602 Shortness of breath: Secondary | ICD-10-CM

## 2014-10-23 DIAGNOSIS — E785 Hyperlipidemia, unspecified: Secondary | ICD-10-CM | POA: Diagnosis present

## 2014-10-23 DIAGNOSIS — N401 Enlarged prostate with lower urinary tract symptoms: Secondary | ICD-10-CM | POA: Diagnosis present

## 2014-10-23 DIAGNOSIS — Z880 Allergy status to penicillin: Secondary | ICD-10-CM | POA: Diagnosis not present

## 2014-10-23 DIAGNOSIS — N139 Obstructive and reflux uropathy, unspecified: Secondary | ICD-10-CM | POA: Diagnosis present

## 2014-10-23 DIAGNOSIS — H3530 Unspecified macular degeneration: Secondary | ICD-10-CM | POA: Diagnosis present

## 2014-10-23 DIAGNOSIS — Z923 Personal history of irradiation: Secondary | ICD-10-CM | POA: Diagnosis not present

## 2014-10-23 DIAGNOSIS — R339 Retention of urine, unspecified: Secondary | ICD-10-CM | POA: Diagnosis present

## 2014-10-23 DIAGNOSIS — F411 Generalized anxiety disorder: Secondary | ICD-10-CM | POA: Diagnosis present

## 2014-10-23 DIAGNOSIS — D72829 Elevated white blood cell count, unspecified: Secondary | ICD-10-CM | POA: Diagnosis present

## 2014-10-23 DIAGNOSIS — Z9981 Dependence on supplemental oxygen: Secondary | ICD-10-CM

## 2014-10-23 DIAGNOSIS — J9809 Other diseases of bronchus, not elsewhere classified: Secondary | ICD-10-CM

## 2014-10-23 DIAGNOSIS — J449 Chronic obstructive pulmonary disease, unspecified: Secondary | ICD-10-CM | POA: Diagnosis present

## 2014-10-23 DIAGNOSIS — X58XXXA Exposure to other specified factors, initial encounter: Secondary | ICD-10-CM | POA: Diagnosis present

## 2014-10-23 DIAGNOSIS — Z79899 Other long term (current) drug therapy: Secondary | ICD-10-CM

## 2014-10-23 DIAGNOSIS — T17590A Other foreign object in bronchus causing asphyxiation, initial encounter: Secondary | ICD-10-CM | POA: Diagnosis present

## 2014-10-23 DIAGNOSIS — C3492 Malignant neoplasm of unspecified part of left bronchus or lung: Secondary | ICD-10-CM | POA: Diagnosis present

## 2014-10-23 DIAGNOSIS — T17500A Unspecified foreign body in bronchus causing asphyxiation, initial encounter: Secondary | ICD-10-CM

## 2014-10-23 DIAGNOSIS — C3490 Malignant neoplasm of unspecified part of unspecified bronchus or lung: Secondary | ICD-10-CM | POA: Insufficient documentation

## 2014-10-23 DIAGNOSIS — Z87891 Personal history of nicotine dependence: Secondary | ICD-10-CM

## 2014-10-23 DIAGNOSIS — R0902 Hypoxemia: Secondary | ICD-10-CM | POA: Diagnosis not present

## 2014-10-23 DIAGNOSIS — R079 Chest pain, unspecified: Secondary | ICD-10-CM

## 2014-10-23 DIAGNOSIS — J9620 Acute and chronic respiratory failure, unspecified whether with hypoxia or hypercapnia: Secondary | ICD-10-CM | POA: Diagnosis present

## 2014-10-23 HISTORY — DX: Chronic obstructive pulmonary disease, unspecified: J44.9

## 2014-10-23 LAB — COMPREHENSIVE METABOLIC PANEL
ALBUMIN: 3.8 g/dL (ref 3.5–5.0)
ALT: 17 U/L (ref 17–63)
AST: 24 U/L (ref 15–41)
Alkaline Phosphatase: 68 U/L (ref 38–126)
Anion gap: 11 (ref 5–15)
BUN: 19 mg/dL (ref 6–20)
CALCIUM: 8.9 mg/dL (ref 8.9–10.3)
CO2: 26 mmol/L (ref 22–32)
CREATININE: 0.76 mg/dL (ref 0.61–1.24)
Chloride: 104 mmol/L (ref 101–111)
GFR calc Af Amer: >60 mL/min (ref >60)
GFR calc non Af Amer: >60 mL/min (ref >60)
Glucose, Bld: 158 mg/dL — ABNORMAL HIGH (ref 65–99)
Potassium: 3.5 mmol/L (ref 3.5–5.1)
Sodium: 141 mmol/L (ref 135–145)
Total Bilirubin: 0.4 mg/dL (ref 0.3–1.2)
Total Protein: 7.2 g/dL (ref 6.5–8.1)

## 2014-10-23 LAB — CBC WITH DIFFERENTIAL/PLATELET
BASOS ABS: 0 10*3/uL (ref 0–0.1)
BASOS PCT: 0 %
EOS ABS: 0.1 10*3/uL (ref 0–0.7)
EOS PCT: 1 %
HCT: 39.3 % — ABNORMAL LOW (ref 40.0–52.0)
Hemoglobin: 12.9 g/dL — ABNORMAL LOW (ref 13.0–18.0)
LYMPHS PCT: 6 %
Lymphs Abs: 1 10*3/uL (ref 1.0–3.6)
MCH: 30.4 pg (ref 26.0–34.0)
MCHC: 32.8 g/dL (ref 32.0–36.0)
MCV: 92.8 fL (ref 80.0–100.0)
Monocytes Absolute: 1.4 10*3/uL — ABNORMAL HIGH (ref 0.2–1.0)
Monocytes Relative: 9 %
Neutro Abs: 13.7 10*3/uL — ABNORMAL HIGH (ref 1.4–6.5)
Neutrophils Relative %: 84 %
PLATELETS: 195 10*3/uL (ref 150–440)
RBC: 4.23 MIL/uL — ABNORMAL LOW (ref 4.40–5.90)
RDW: 14.7 % — AB (ref 11.5–14.5)
WBC: 16.2 10*3/uL — ABNORMAL HIGH (ref 3.8–10.6)

## 2014-10-23 LAB — LACTIC ACID, PLASMA: LACTIC ACID, VENOUS: 1.6 mmol/L (ref 0.5–2.0)

## 2014-10-23 LAB — TROPONIN I

## 2014-10-23 LAB — BRAIN NATRIURETIC PEPTIDE: B NATRIURETIC PEPTIDE 5: 54 pg/mL (ref 0.0–100.0)

## 2014-10-23 MED ORDER — IOHEXOL 350 MG/ML SOLN
75.0000 mL | Freq: Once | INTRAVENOUS | Status: AC | PRN
  Administered 2014-10-23: 75 mL via INTRAVENOUS

## 2014-10-23 MED ORDER — LEVOFLOXACIN IN D5W 750 MG/150ML IV SOLN
750.0000 mg | Freq: Once | INTRAVENOUS | Status: AC
  Administered 2014-10-23: 750 mg via INTRAVENOUS
  Filled 2014-10-23: qty 150

## 2014-10-23 MED ORDER — SODIUM CHLORIDE 0.9 % IV BOLUS (SEPSIS)
500.0000 mL | Freq: Once | INTRAVENOUS | Status: AC
Start: 2014-10-23 — End: 2014-10-23
  Administered 2014-10-23: 500 mL via INTRAVENOUS

## 2014-10-23 MED ORDER — VANCOMYCIN HCL IN DEXTROSE 1-5 GM/200ML-% IV SOLN
1000.0000 mg | Freq: Once | INTRAVENOUS | Status: AC
  Administered 2014-10-23: 1000 mg via INTRAVENOUS
  Filled 2014-10-23: qty 200

## 2014-10-23 NOTE — ED Notes (Signed)
Patient presents to ED via [facility] from home. Patient reported to been markedly SOB with an initial sat of 83% on RA. Of note, patient has a h/o emphysema and lung cancer. Duoneb x 1 given by EMS and patient placed on supplemental oxygen at 3L/[place] - sats improved to 95%. Patient initially had mid abdominal pain with (+) radiation into RIGHT side on scene with EMS - reports that it has resolved at this point.

## 2014-10-23 NOTE — ED Notes (Signed)
Dr. Reece Levy at bedside

## 2014-10-23 NOTE — ED Provider Notes (Signed)
[facility] Emergency Department Provider Note  ____________________________________________  Time seen: Approximately 8:00 PM  I have reviewed the triage vital signs and the nursing notes.   HISTORY  Chief Complaint Shortness of Breath and Abdominal Pain    HPI Timothy Ali is a 81 y.o. 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 Worse with exertion. He is also complaining of mild left-sided chest pain which is nonradiating. He has had cough productive of whitish phlegm but denies fevers. No nausea, vomiting, diarrhea. Current severity of his symptoms is 8 out of 10. On EMS arrival, he was hypoxic at 83% but he was not wearing his oxygen. He appears to be wheezing and was given a DuoNeb which cleared his wheezing improved his oxygen saturation however the patient reports that symptomatically he does not sense any improvement.   Past Medical History  Diagnosis Date  . Major depressive disorder, recurrent, severe with psychotic features   . Persistent disorder of initiating or maintaining sleep   . Generalized anxiety disorder   . Major depressive disorder, recurrent, severe with psychotic features   . Persistent disorder of initiating or maintaining sleep   . Asthma   . Cancer     Left lung    Patient Active Problem List   Diagnosis Date Noted  . CAFL (chronic airflow limitation) 08/01/2014  . Depression, major, recurrent, severe with psychosis 08/01/2014  . H/O gastrointestinal disease 08/01/2014  . Anxiety, generalized 08/01/2014  . H/O elevated lipids 08/01/2014  . Insomnia, persistent 08/01/2014  . H/O malignant neoplasm 08/01/2014  . Prostate disease 08/01/2014  . Macular  degeneration 08/01/2014  . Obstruction of urinary tract 08/01/2014    Past Surgical History  Procedure Laterality Date  . Hemorroidectomy    . Lung surgery      Current Outpatient Rx  Name  Route  Sig  Dispense  Refill  . albuterol (PROVENTIL HFA;VENTOLIN HFA) 108 (90 BASE) MCG/ACT inhaler   Inhalation   Inhale 1-2 puffs into the lungs QID.          Marland Kitchen atorvastatin (LIPITOR) 40 MG tablet   Oral   Take 1 tablet by mouth daily.         Marland Kitchen econazole nitrate 1 % cream   Topical   Apply 1 application topically 2 (two) times daily.          Marland Kitchen esomeprazole (NEXIUM) 40 MG capsule   Oral   Take 40 mg by mouth daily at 12 noon.         Marland Kitchen LORazepam (ATIVAN) 1 MG tablet      Take 1 tablet 3 times a day and one half a tablet once a day.   315 tablet   0   . mometasone-formoterol (DULERA) 200-5 MCG/ACT AERO   Inhalation   Inhale 2 puffs into the lungs 2 (two) times daily.          . QUEtiapine (SEROQUEL XR) 300 MG 24 hr tablet   Oral   Take 1 tablet (300 mg total) by mouth at bedtime.   90 tablet   0   . tamsulosin (FLOMAX) 0.4 MG CAPS capsule   Oral   Take 1 capsule by mouth daily.         Marland Kitchen  tiotropium (SPIRIVA) 18 MCG inhalation capsule   Inhalation   Place into inhaler and inhale daily.         Marland Kitchen venlafaxine XR (EFFEXOR-XR) 150 MG 24 hr capsule   Oral   Take 1 capsule (150 mg total) by mouth daily.   90 capsule   0   . zolpidem (AMBIEN) 10 MG tablet   Oral   Take 1 tablet by mouth at bedtime.         Marland Kitchen esomeprazole (NEXIUM) 40 MG packet   Oral   Take 1 packet by mouth daily.         . QUEtiapine Fumarate (SEROQUEL XR) 150 MG 24 hr tablet   Oral   Take 1 tablet by mouth 2 (two) times daily.         Marland Kitchen triamcinolone cream (KENALOG) 0.1 %               . venlafaxine XR (EFFEXOR-XR) 75 MG 24 hr capsule   Oral   Take 1 capsule by mouth daily.         Marland Kitchen zolpidem (AMBIEN CR) 12.5 MG CR tablet   Oral   Take 1 tablet (12.5 mg total) by  mouth at bedtime as needed for sleep. Patient not taking: Reported on 10/23/2014   90 tablet   0     Allergies Penicillins  Family History  Problem Relation Age of Onset  . COPD Mother   . Lung cancer Father     Social History History  Substance Use Topics  . Smoking status: Former Research scientist (life sciences)  . Smokeless tobacco: Never Used     Comment: quit 18 years ago  . Alcohol Use: No    Review of Systems Constitutional: No fever/chills Eyes: No visual changes. ENT: No sore throat. Cardiovascular: + chest pain. Respiratory: + shortness of breath. Gastrointestinal: No abdominal pain.  No nausea, no vomiting.  No diarrhea.  No constipation. Genitourinary: Negative for dysuria. Musculoskeletal: Negative for back pain. Skin: Negative for rash. Neurological: Negative for headaches, focal weakness or numbness.  10-point ROS otherwise negative.  ____________________________________________   PHYSICAL EXAM:  Filed Vitals:   10/23/14 2000 10/23/14 2117 10/23/14 2230  BP: 152/87 135/72 130/60  Pulse: 118 110 97  Temp: 97.9 F (36.6 C)    TempSrc: Oral    Resp: 24 36 19  Height: '5\' 7"'$  (1.702 m)    Weight: 156 lb (70.761 kg)    SpO2: 93% 93% 96%     Constitutional: Alert and oriented. Chronically ill-appearing in mild respiratory distress.  Eyes: Conjunctivae are normal. PERRL. EOMI. Head: Atraumatic. Nose: No congestion/rhinnorhea. Mouth/Throat: Mucous membranes are moist.  Oropharynx non-erythematous. Neck: No stridor.   Cardiovascular: tachycardic rate, regular rhythm. Grossly normal heart sounds.  Good peripheral circulation. Respiratory: Mildly increased work of breathing, increased respiratory rate, diffusely diminished breath sounds throughout the lung fields. Gastrointestinal: Soft and nontender. + mild distension.No abdominal bruits. No CVA tenderness. Genitourinary: deferred *Musculoskeletal: No lower extremity tenderness nor edema.  No joint effusions. Neurologic:   Normal speech and language. No gross focal neurologic deficits are appreciated. Speech is normal. No gait instability. Skin:  Skin is warm, dry and intact. No rash noted. Psychiatric: Mood and affect are normal. Speech and behavior are normal.  ____________________________________________   LABS (all labs ordered are listed, but only abnormal results are displayed)  Labs Reviewed  CBC WITH DIFFERENTIAL/PLATELET - Abnormal; Notable for the following:    WBC 16.2 (*)    RBC 4.23 (*)  Hemoglobin 12.9 (*)    HCT 39.3 (*)    RDW 14.7 (*)    Neutro Abs 13.7 (*)    Monocytes Absolute 1.4 (*)    All other components within normal limits  COMPREHENSIVE METABOLIC PANEL - Abnormal; Notable for the following:    Glucose, Bld 158 (*)    All other components within normal limits  CULTURE, BLOOD (ROUTINE X 2)  CULTURE, BLOOD (ROUTINE X 2)  TROPONIN I  BRAIN NATRIURETIC PEPTIDE  LACTIC ACID, PLASMA  LACTIC ACID, PLASMA   ____________________________________________  EKG  ED ECG REPORT I, Joanne Gavel, the attending physician, personally viewed and interpreted this ECG.   Date: 10/23/2014  EKG Time: 19:57  Rate: 116  Rhythm: sinus tachycardia  Axis: normal  Intervals:none  ST&T Change: ST depression in lead 2, 3, aVF, V2 through V6  ____________________________________________  RADIOLOGY  CXR  FINDINGS: There are scattered areas of scarring bilaterally, essentially stable. There is no appreciable edema or consolidation. The heart size is normal. Pulmonary vascularity is stable and within normal limits. No adenopathy. Soft tissue prominence in the medial left apex from the in known paraspinal mass at T3-4 on the left side is again noted but better seen on recent CT.  IMPRESSION: The known mass on the left in the T3-4 paraspinous region is present but better seen on recent CT. Areas of parenchymal lung scarring are stable. There is no demonstrable lung edema or  consolidation. No change in cardiac silhouette.   CTA chest  FINDINGS: THORACIC INLET/BODY WALL:  No acute abnormality.  MEDIASTINUM:  Normal heart size. No pericardial effusion. Diffuse atherosclerosis, including the coronary arteries. CTA of the pulmonary arteries is limited by intermittent motion, but exam is overall diagnostic and negative for pulmonary embolism. No aortic dissection or aneurysm. No lymphadenopathy.  LUNG WINDOWS:  Stable lytic metastasis within the left T3 body and posterior elements also involving the left third and fourth rib head and necks.  There is treatment change with scarring on the left.  Emphysema.  Extensive mucoid impaction in segmental airway is of the medial right lower lobe. No pneumonia, edema, effusion, or pneumothorax.  UPPER ABDOMEN:  No acute findings.  OSSEOUS:  Metastatic disease as noted above. There is spondyloarthropathy with bridging osteophytes throughout the mid lower thoracic spine  Review of the MIP images confirms the above findings.  IMPRESSION: 1. No evidence of pulmonary embolism. 2. Right lower lobe bronchial impaction which could be from aspiration or airway infection. 3. Stable metastasis eroding the left T3 vertebra and third, fourth ribs. 4. Emphysema.  ____________________________________________   PROCEDURES  Procedure(s) performed: None  Critical Care performed: Yes, see critical care note(s). Total critical care time spent 35 minutes.  ____________________________________________   INITIAL IMPRESSION / ASSESSMENT AND PLAN / ED COURSE  Pertinent labs & imaging results that were available during my care of the patient were reviewed by me and considered in my medical decision making (see chart for details).  ELADIO DENTREMONT is a 81 y.o. 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 On exam diffusely diminished breath sounds throughout, mildly increased work of breathing and he is tachypneic with increased oxygen requirement. Differential is broad including ACS, pulmonary edema, sepsis secondary to possible pneumonia. We'll obtain blood cultures, treat empirically with Vancomycin, Levaquin, aztreonam, give  IV fluids, obtain screening labs and likely obtain CTA chest. Anticipate admission.   ----------------------------------------- 10:56 PM on 10/23/2014 -----------------------------------------  Labs notable for leukocytosis with neutrophilic predominance, troponin negative. CTA chest negative for PE but he does have extensive mucoid impaction in segmental airways of the right middle lobe which could be from aspiration or an airway infection. Given his acute on chronic hypoxemic respiratory failure, CTA findings, case was discussed with hospitalist, Dr. Jannifer Franklin, for admission. ____________________________________________   FINAL CLINICAL IMPRESSION(S) / ED DIAGNOSES  Final diagnoses:  SOB (shortness of breath)  Chest pain, unspecified chest pain type  Mucoid impaction of bronchi      Joanne Gavel, MD 10/23/14 2314

## 2014-10-24 DIAGNOSIS — R0602 Shortness of breath: Secondary | ICD-10-CM

## 2014-10-24 DIAGNOSIS — R0902 Hypoxemia: Secondary | ICD-10-CM

## 2014-10-24 DIAGNOSIS — C3490 Malignant neoplasm of unspecified part of unspecified bronchus or lung: Secondary | ICD-10-CM | POA: Diagnosis present

## 2014-10-24 DIAGNOSIS — R05 Cough: Secondary | ICD-10-CM

## 2014-10-24 DIAGNOSIS — C3402 Malignant neoplasm of left main bronchus: Secondary | ICD-10-CM

## 2014-10-24 DIAGNOSIS — J189 Pneumonia, unspecified organism: Secondary | ICD-10-CM

## 2014-10-24 LAB — CREATININE, SERUM
Creatinine, Ser: 0.63 mg/dL (ref 0.61–1.24)
GFR calc Af Amer: >60 mL/min (ref >60)
GFR calc non Af Amer: >60 mL/min (ref >60)

## 2014-10-24 LAB — CBC
HEMATOCRIT: 35.9 % — AB (ref 40.0–52.0)
Hemoglobin: 12 g/dL — ABNORMAL LOW (ref 13.0–18.0)
MCH: 31.3 pg (ref 26.0–34.0)
MCHC: 33.5 g/dL (ref 32.0–36.0)
MCV: 93.5 fL (ref 80.0–100.0)
Platelets: 167 10*3/uL (ref 150–440)
RBC: 3.84 MIL/uL — ABNORMAL LOW (ref 4.40–5.90)
RDW: 14.7 % — ABNORMAL HIGH (ref 11.5–14.5)
WBC: 13.8 10*3/uL — ABNORMAL HIGH (ref 3.8–10.6)

## 2014-10-24 MED ORDER — ONDANSETRON HCL 4 MG/2ML IJ SOLN: 4.0000 mg | Freq: Four times a day (QID) | INTRAMUSCULAR | Status: DC | PRN

## 2014-10-24 MED ORDER — SODIUM CHLORIDE 0.9 % IV SOLN
INTRAVENOUS | Status: AC
  Administered 2014-10-24: 01:00:00 via INTRAVENOUS

## 2014-10-24 MED ORDER — ALBUTEROL SULFATE (2.5 MG/3ML) 0.083% IN NEBU: 2.5000 mg | INHALATION_SOLUTION | RESPIRATORY_TRACT | Status: DC | PRN

## 2014-10-24 MED ORDER — AZITHROMYCIN 250 MG PO TABS
250.0000 mg | ORAL_TABLET | Freq: Every day | ORAL | Status: DC
  Administered 2014-10-25: 10:00:00 250 mg via ORAL
  Filled 2014-10-24: qty 1

## 2014-10-24 MED ORDER — LORAZEPAM 1 MG PO TABS
1.0000 mg | ORAL_TABLET | Freq: Three times a day (TID) | ORAL | Status: DC
  Administered 2014-10-24 – 2014-10-25 (×5): 1 mg via ORAL
  Filled 2014-10-24 (×5): qty 1

## 2014-10-24 MED ORDER — DEXTROSE 5 % IV SOLN
1.0000 g | INTRAVENOUS | Status: AC
  Administered 2014-10-24: 01:00:00 1 g via INTRAVENOUS
  Filled 2014-10-24: qty 1

## 2014-10-24 MED ORDER — MOMETASONE FURO-FORMOTEROL FUM 200-5 MCG/ACT IN AERO
2.0000 | INHALATION_SPRAY | Freq: Two times a day (BID) | RESPIRATORY_TRACT | Status: DC
  Administered 2014-10-24 – 2014-10-25 (×3): 2 via RESPIRATORY_TRACT
  Filled 2014-10-24: qty 8.8

## 2014-10-24 MED ORDER — ASPIRIN EC 81 MG PO TBEC
81.0000 mg | DELAYED_RELEASE_TABLET | Freq: Every day | ORAL | Status: DC
  Filled 2014-10-24: qty 1

## 2014-10-24 MED ORDER — LEVOFLOXACIN IN D5W 750 MG/150ML IV SOLN
750.0000 mg | INTRAVENOUS | Status: DC
  Filled 2014-10-24: qty 150

## 2014-10-24 MED ORDER — VENLAFAXINE HCL ER 75 MG PO CP24
75.0000 mg | ORAL_CAPSULE | Freq: Every day | ORAL | Status: DC
  Administered 2014-10-24 – 2014-10-25 (×2): 75 mg via ORAL
  Filled 2014-10-24 (×2): qty 1

## 2014-10-24 MED ORDER — LORAZEPAM 1 MG PO TABS: 1.0000 mg | ORAL_TABLET | Freq: Three times a day (TID) | ORAL | Status: DC

## 2014-10-24 MED ORDER — TAMSULOSIN HCL 0.4 MG PO CAPS
0.4000 mg | ORAL_CAPSULE | Freq: Every day | ORAL | Status: DC
  Administered 2014-10-24 – 2014-10-25 (×2): 0.4 mg via ORAL
  Filled 2014-10-24 (×2): qty 1

## 2014-10-24 MED ORDER — ACETAMINOPHEN 650 MG RE SUPP: 650.0000 mg | Freq: Four times a day (QID) | RECTAL | Status: DC | PRN

## 2014-10-24 MED ORDER — TIOTROPIUM BROMIDE MONOHYDRATE 18 MCG IN CAPS
18.0000 ug | ORAL_CAPSULE | Freq: Every day | RESPIRATORY_TRACT | Status: DC
  Administered 2014-10-24 – 2014-10-25 (×2): 18 ug via RESPIRATORY_TRACT
  Filled 2014-10-24: qty 5

## 2014-10-24 MED ORDER — AZITHROMYCIN 250 MG PO TABS
500.0000 mg | ORAL_TABLET | Freq: Every day | ORAL | Status: AC
  Administered 2014-10-24: 500 mg via ORAL
  Filled 2014-10-24: qty 2

## 2014-10-24 MED ORDER — ACETAMINOPHEN 325 MG PO TABS: 650.0000 mg | ORAL_TABLET | Freq: Four times a day (QID) | ORAL | Status: DC | PRN

## 2014-10-24 MED ORDER — LORAZEPAM 0.5 MG PO TABS
0.5000 mg | ORAL_TABLET | ORAL | Status: DC | PRN
Start: 2014-10-24 — End: 2014-10-25
  Administered 2014-10-24: 0.5 mg via ORAL
  Filled 2014-10-24: qty 1

## 2014-10-24 MED ORDER — ONDANSETRON HCL 4 MG PO TABS: 4.0000 mg | ORAL_TABLET | Freq: Four times a day (QID) | ORAL | Status: DC | PRN

## 2014-10-24 MED ORDER — DEXTROSE 5 % IV SOLN
1.0000 g | Freq: Three times a day (TID) | INTRAVENOUS | Status: DC
  Filled 2014-10-24 (×4): qty 1

## 2014-10-24 MED ORDER — QUETIAPINE FUMARATE ER 50 MG PO TB24
150.0000 mg | ORAL_TABLET | Freq: Two times a day (BID) | ORAL | Status: DC
  Administered 2014-10-24: 150 mg via ORAL
  Filled 2014-10-24 (×3): qty 3

## 2014-10-24 MED ORDER — PANTOPRAZOLE SODIUM 40 MG PO TBEC
40.0000 mg | DELAYED_RELEASE_TABLET | Freq: Every day | ORAL | Status: DC
  Administered 2014-10-24 – 2014-10-25 (×2): 40 mg via ORAL
  Filled 2014-10-24 (×3): qty 1

## 2014-10-24 MED ORDER — ATORVASTATIN CALCIUM 20 MG PO TABS
40.0000 mg | ORAL_TABLET | Freq: Every day | ORAL | Status: DC
  Administered 2014-10-24 – 2014-10-25 (×2): 40 mg via ORAL
  Filled 2014-10-24 (×2): qty 2

## 2014-10-24 MED ORDER — ZOLPIDEM TARTRATE 5 MG PO TABS
5.0000 mg | ORAL_TABLET | Freq: Every day | ORAL | Status: DC
Start: 2014-10-24 — End: 2014-10-25
  Administered 2014-10-24 (×2): 5 mg via ORAL
  Filled 2014-10-24 (×2): qty 1

## 2014-10-24 MED ORDER — ENOXAPARIN SODIUM 40 MG/0.4ML ~~LOC~~ SOLN
40.0000 mg | SUBCUTANEOUS | Status: DC
Start: 2014-10-24 — End: 2014-10-25
  Administered 2014-10-24: 40 mg via SUBCUTANEOUS
  Filled 2014-10-24: qty 0.4

## 2014-10-24 MED ORDER — CEFTRIAXONE SODIUM IN DEXTROSE 20 MG/ML IV SOLN
1.0000 g | INTRAVENOUS | Status: DC
  Administered 2014-10-24: 09:00:00 1 g via INTRAVENOUS
  Filled 2014-10-24 (×3): qty 50

## 2014-10-24 NOTE — Plan of Care (Signed)
Problem: Discharge Progression Outcomes Goal: Other Discharge Outcomes/Goals Pt alert and oriented, pt sob on exertion, on 3L O2 at 96%. No complaints of pain or discomfort, pt resting comfortably.

## 2014-10-24 NOTE — Plan of Care (Signed)
Problem: Discharge Progression Outcomes Goal: Other Discharge Outcomes/Goals Outcome: Progressing O2 sat 94% on 3L O2 via [place]. No SOB noted. Tolerates Inhalers without complications Ambulated down hallways with physical therapy. Tolerated well. Physical therapy signed off. Denies pain. Tolerating Healthy Heart Diet.  VSS. Labs Improving. Encouraging Fluids. IVF's discontinued. No s/s of complications noted.

## 2014-10-24 NOTE — Progress Notes (Signed)
[place] [facility] [place] NAME: Timothy Ali    MR#:  [id number]  DATE OF BIRTH:  1930/02/29  SUBJECTIVE:   C/o sob and coughing up lot o phlegm REVIEW OF SYSTEMS:   Review of Systems  Constitutional: Negative for fever, chills and weight loss.  HENT: Negative for ear discharge, ear pain and nosebleeds.   Eyes: Negative for blurred vision, pain and discharge.  Respiratory: Positive for cough and shortness of breath. Negative for sputum production, wheezing and stridor.   Cardiovascular: Negative for chest pain, palpitations, orthopnea and PND.  Gastrointestinal: Negative for nausea, vomiting, abdominal pain and diarrhea.  Genitourinary: Negative for urgency and frequency.  Musculoskeletal: Negative for back pain and joint pain.  Neurological: Negative for sensory change, speech change, focal weakness and weakness.  Psychiatric/Behavioral: Negative for depression. The patient is not nervous/anxious.    Tolerating Diet:yes Tolerating PT: eval pending  DRUG ALLERGIES:   Allergies  Allergen Reactions  . Penicillins     VITALS:  Blood pressure 157/63, pulse 81, temperature 98.2 F (36.8 C), temperature source Oral, resp. rate 18, height '5\' 7"'$  (1.702 m), weight 68.947 kg (152 lb), SpO2 94 %.  PHYSICAL EXAMINATION:   Physical Exam  GENERAL:  81 y.o.-year-old patient lying in the bed with no acute distress.  EYES: Pupils equal, round, reactive to light and accommodation. No scleral icterus. Extraocular muscles intact.  HEENT: Head atraumatic, normocephalic. Oropharynx and nasopharynx clear.  NECK:  Supple, no jugular venous distention. No thyroid enlargement, no tenderness.  LUNGS: decreased  breath sounds bilaterally, no wheezing, rales, rhonchi. No use of accessory muscles of respiration.  CARDIOVASCULAR: S1, S2 normal. No murmurs, rubs, or gallops.  ABDOMEN: Soft, nontender, nondistended. Bowel sounds present. No organomegaly or  mass.  EXTREMITIES: No cyanosis, clubbing or edema b/l.    NEUROLOGIC: Cranial nerves II through XII are intact. No focal Motor or sensory deficits b/l.   PSYCHIATRIC: The patient is alert and oriented x 3.  SKIN: No obvious rash, lesion, or ulcer.   LABORATORY PANEL:   CBC  Recent Labs Lab 10/24/14 0420  WBC 13.8*  HGB 12.0*  HCT 35.9*  PLT 167    Chemistries   Recent Labs Lab 10/23/14 2014 10/24/14 0420  NA 141  --   K 3.5  --   CL 104  --   CO2 26  --   GLUCOSE 158*  --   BUN 19  --   CREATININE 0.76 0.63  CALCIUM 8.9  --   AST 24  --   ALT 17  --   ALKPHOS 68  --   BILITOT 0.4  --     Cardiac Enzymes  Recent Labs Lab 10/23/14 2014  TROPONINI <0.03    RADIOLOGY:  Ct Angio Chest Pe W/cm &/or Wo Cm  10/23/2014   CLINICAL DATA:  Lung cancer and shortness of breath. Evaluate for pulmonary embolism.  EXAM: CT ANGIOGRAPHY CHEST WITH CONTRAST  TECHNIQUE: Multidetector CT imaging of the chest was performed using the standard protocol during bolus administration of intravenous contrast. Multiplanar CT image reconstructions and MIPs were obtained to evaluate the vascular anatomy.  CONTRAST:  42m OMNIPAQUE IOHEXOL 350 MG/ML SOLN  COMPARISON:  10/13/2014  FINDINGS: THORACIC INLET/BODY WALL:  No acute abnormality.  MEDIASTINUM:  Normal heart size. No pericardial effusion. Diffuse atherosclerosis, including the coronary arteries. CTA of the pulmonary arteries is limited by intermittent motion, but exam is overall diagnostic and negative for pulmonary  embolism. No aortic dissection or aneurysm. No lymphadenopathy.  LUNG WINDOWS:  Stable lytic metastasis within the left T3 body and posterior elements also involving the left third and fourth rib head and necks.  There is treatment change with scarring on the left.  Emphysema.  Extensive mucoid impaction in segmental airway is of the medial right lower lobe. No pneumonia, edema, effusion, or pneumothorax.  UPPER ABDOMEN:  No acute  findings.  OSSEOUS:  Metastatic disease as noted above. There is spondyloarthropathy with bridging osteophytes throughout the mid lower thoracic spine  Review of the MIP images confirms the above findings.  IMPRESSION: 1. No evidence of pulmonary embolism. 2. Right lower lobe bronchial impaction which could be from aspiration or airway infection. 3. Stable metastasis eroding the left T3 vertebra and third, fourth ribs. 4. Emphysema.   Electronically Signed   By: Monte Fantasia M.D.   On: 10/23/2014 22:35   Dg Chest Portable 1 View  10/23/2014   CLINICAL DATA:  Shortness of Breath.  Right-sided chest pain  EXAM: PORTABLE CHEST - 1 VIEW  COMPARISON:  Chest CT October 13, 2014; chest radiograph August 12, 2014  FINDINGS: There are scattered areas of scarring bilaterally, essentially stable. There is no appreciable edema or consolidation. The heart size is normal. Pulmonary vascularity is stable and within normal limits. No adenopathy. Soft tissue prominence in the medial left apex from the in known paraspinal mass at T3-4 on the left side is again noted but better seen on recent CT.  IMPRESSION: The known mass on the left in the T3-4 paraspinous region is present but better seen on recent CT. Areas of parenchymal lung scarring are stable. There is no demonstrable lung edema or consolidation. No change in cardiac silhouette.   Electronically Signed   By: Lowella Grip III M.D.   On: 10/23/2014 20:31     ASSESSMENT AND PLAN:   81 y.o. male with a known history of COPD/emphysema on home O2, CA left lung status post radiation, GAD/depression, BPH with outlet obstruction, hyperlipidemia was brought in by EMS to the emergency room with the complaints of shortness of breath with hypoxia. Patient mentions that he has been having ongoing cough with expectoration of clear sputum and later in the evening developed acute shortness of breath and was not able to bring up sputum hence was brought to the emergency room for  further evaluation. Patient was noted to be hypoxic by the EMS with O2 saturations around 83%  1. Acute on chronic hypoxic respiratory failure secondary to extensive mucoid plugging and possible right LL Pneumonia -IV ceftriaxone and po zithromax -prn nebs, inhalers -incentive spirometer  2. Right lower lobe bronchial impaction-aspiration versus infection. -cont above rx  3. Leukocytosis, possible pneumonia.  4. COPD, stable on home medications -no flare. Hold of steorids . 5. History of carcinoma left lung status post radiation. With now CT findings s/o mets to vertebrae and ribs Oncology consulted. Spoke with [employer], NP  6. BPH with outflow obstruction on intermittent self catheterization.   7. Hyperlipidemia, on atorvastatin.  8. GAD/depression, stable on home medications.  9.PT to eval for weakness  Case discussed with Care Management/Social Worker. Management plans discussed with the patient and  in agreement.  CODE STATUS: Full  DVT Prophylaxis: heparin  TOTAL TIME TAKING CARE OF THIS PATIENT: 35 minutes.  >50% time spent on counselling and coordination of care  POSSIBLE D/C IN 2-3 DAYS, DEPENDING ON CLINICAL CONDITION.   Nikolaj Geraghty M.D on 10/24/2014 at  12:18 PM  Between 7am to 6pm - Pager - [phone]  After 6pm go to [email or web address] - password [email or web address] [facility]  Office  [phone]  CC: Primary care physician; Albina Billet, MD

## 2014-10-24 NOTE — Care Management (Signed)
Admitted to Timothy Ali with the diagnosis of acute/chronic respiratory failure. Lives alone. Sister is Timothy Ali [phone]). Sees Dr. Benita Stabile. Last seen a couple of weeks ago. Self-catheterization in place.  Home Health in the past, but doesn't remember the name of agency. [facility] about a year ago. Home oxygen for the last couple of years. The last year thru [place].  Physical therapy evaluation completed. No therapy needs identified. Shelbie Ammons RN MSN Care Management [phone]

## 2014-10-24 NOTE — Plan of Care (Signed)
Problem: Discharge Progression Outcomes Goal: Discharge plan in place and appropriate Outcome: Progressing Pt goes by Timothy Ali, he lives at home alone, his main support person is his sister. He is taking home medications and has a hx of Major depressive disorder, recurrent, severe with psychotic features, Persistent disorder of initiating or maintaining sleep, Generalized anxiety disorder, Major depressive disorder, recurrent, severe with psychotic features, Persistent disorder of initiating or maintaining sleep, Asthma, Cancer, Left lung, COPD. He is on home 02 at 2L baseline.

## 2014-10-24 NOTE — Progress Notes (Signed)
ANTIBIOTIC CONSULT NOTE - INITIAL  Pharmacy Consult for Aztreonam/Levaquin Indication: rule out pneumonia  Allergies  Allergen Reactions  . Penicillins     Patient Measurements: Height: '5\' 7"'$  (170.2 cm) Weight: 156 lb (70.761 kg) IBW/kg (Calculated) : 66.1  Vital Signs: Temp: 97.9 F (36.6 C) (07/07 2000) Temp Source: Oral (07/07 2000) BP: 117/59 mmHg (07/08 0011) Pulse Rate: 86 (07/08 0011) Intake/Output from previous day:   Intake/Output from this shift:    Labs:  Recent Labs  10/23/14 2014  WBC 16.2*  HGB 12.9*  PLT 195  CREATININE 0.76   Estimated Creatinine Clearance: 65.4 mL/min (by C-G formula based on Cr of 0.76). No results for input(s): VANCOTROUGH, VANCOPEAK, VANCORANDOM, GENTTROUGH, GENTPEAK, GENTRANDOM, TOBRATROUGH, TOBRAPEAK, TOBRARND, AMIKACINPEAK, AMIKACINTROU, AMIKACIN in the last 72 hours.   Microbiology: No results found for this or any previous visit (from the past 720 hour(s)).  Medical History: Past Medical History  Diagnosis Date  . Major depressive disorder, recurrent, severe with psychotic features   . Persistent disorder of initiating or maintaining sleep   . Generalized anxiety disorder   . Major depressive disorder, recurrent, severe with psychotic features   . Persistent disorder of initiating or maintaining sleep   . Asthma   . Cancer     Left lung  . COPD (chronic obstructive pulmonary disease)     Medications:  Scheduled:   Infusions:  . aztreonam    . aztreonam    . levofloxacin (LEVAQUIN) IV     PRN:   Assessment: 81 y/o M with lung CA admitted with SOB, leukocytosis, and possible PNA ordered empiric abx.   Plan:  1. Levaquin 750 mg iv q 24 h.   2. Aztreonam 1 g iv q 8 h.   Ulice Dash D 10/24/2014,12:42 AM

## 2014-10-24 NOTE — Evaluation (Signed)
Physical Therapy Evaluation Patient Details Name: Timothy Ali MRN: [id number] DOB: 07/16/1929 Today's Date: 10/24/2014   History of Present Illness  Patient is a very pleasant 81 y/o male that presents with acute on chronic respiratory failure and shortness of breath. Patient has a stable metastisis eroding left T3 vertebrae as well as emphysema and lung carcinoma.   Clinical Impression  Patient is an 81 y/o male that presents from home with shortness of breath. He has been independent in community mobility prior to this admission, though he uses 2L of O2 at home. Patient demonstrates very mild decrease in dynamic mobility today, though otherwise he appears at his baseline with mobility. Patient demonstrates excellent gait speed with minimal decrease in O2 saturation levels with mobility 89-90% on 3L. Patient demonstrates mild activity tolerance decrease which will continue to be addressed by skilled acute PT services.     Follow Up Recommendations No PT follow up    Equipment Recommendations       Recommendations for Other Services       Precautions / Restrictions Restrictions Weight Bearing Restrictions: No      Mobility  Bed Mobility Overal bed mobility: Independent                Transfers Overall transfer level: Independent               General transfer comment: 5x sit to stand - 11 seconds.   Ambulation/Gait Ambulation/Gait assistance: Supervision Ambulation Distance (Feet): 400 Feet   Gait Pattern/deviations: WFL(Within Functional Limits)     General Gait Details: Patient displays excellent gait speed, though mild balance deficits with head turns.   Stairs            Wheelchair Mobility    Modified Rankin (Stroke Patients Only)       Balance Overall balance assessment: Independent                               Standardized Balance Assessment Standardized Balance Assessment :  (5x sit to stand - 11 seconds. )            Pertinent Vitals/Pain Pain Assessment:  (Patient does not complain of any pain today.)    Home Living Family/patient expects to be discharged to:: Private residence Living Arrangements: Alone   Type of Home: House Home Access: Level entry     Home Layout: One level Home Equipment: Environmental consultant - 2 wheels;Cane - single point      Prior Function Level of Independence: Independent         Comments: Has still been driving.      Hand Dominance        Extremity/Trunk Assessment   Upper Extremity Assessment: Overall WFL for tasks assessed           Lower Extremity Assessment: Overall WFL for tasks assessed      Cervical / Trunk Assessment: Normal  Communication   Communication: No difficulties  Cognition Arousal/Alertness: Awake/alert Behavior During Therapy: WFL for tasks assessed/performed Overall Cognitive Status: Within Functional Limits for tasks assessed                      General Comments      Exercises        Assessment/Plan    PT Assessment Patient needs continued PT services  PT Diagnosis Generalized weakness   PT Problem List Decreased strength;Cardiopulmonary status limiting activity;Decreased balance  PT Treatment Interventions Gait training;Therapeutic activities;Therapeutic exercise   PT Goals (Current goals can be found in the Care Plan section) Acute Rehab PT Goals Patient Stated Goal: To return home safely.  PT Goal Formulation: With patient Time For Goal Achievement: 11/07/14 Potential to Achieve Goals: Good    Frequency Min 2X/week   Barriers to discharge        Co-evaluation               End of Session Equipment Utilized During Treatment: Gait belt Activity Tolerance: Patient tolerated treatment well Patient left: in chair;with call bell/phone within reach;with chair alarm set Nurse Communication: Mobility status         Time: 1055-1110 PT Time Calculation (min) (ACUTE ONLY): 15 min   Charges:    PT Evaluation $Initial PT Evaluation Tier I: 1 Procedure     PT G Codes:      Kerman Passey, PT, DPT    10/24/2014, 2:07 PM

## 2014-10-24 NOTE — Progress Notes (Signed)
Initial Nutrition Assessment  INTERVENTION:  Meals and Snacks: Cater to patient preferences  Medical Food Supplement Therapy: will send Magic Cup on dinner trays.    NUTRITION DIAGNOSIS:  No nutrition diagnosis at this time  GOAL:  Patient will meet greater than or equal to 90% of their needs  MONITOR:   (Energy Intake, Anthropometrics, Pulmonary Profile)  REASON FOR ASSESSMENT:  Malnutrition Screening Tool    ASSESSMENT:  Pt admitted with acute on chronic respiratory failure. PMHx:  Past Medical History  Diagnosis Date  . Major depressive disorder, recurrent, severe with psychotic features   . Persistent disorder of initiating or maintaining sleep   . Generalized anxiety disorder   . Major depressive disorder, recurrent, severe with psychotic features   . Persistent disorder of initiating or maintaining sleep   . Asthma   . Cancer     Left lung  . COPD (chronic obstructive pulmonary disease)     Diet Order:  Diet Heart Room service appropriate?: Yes; Fluid consistency:: Thin  Current Nutrition: Pt reports eating well this am 100% of hamburger patty and eggs this am.   Food/Nutrition-Related History: Pt reports good appetite PTA eating usually 2-3 meals per day PTA; no supplement drinks   Medications: Protonix  Electrolyte/Renal Profile and Glucose Profile:   Recent Labs Lab 10/23/14 2014 10/24/14 0420  NA 141  --   K 3.5  --   CL 104  --   CO2 26  --   BUN 19  --   CREATININE 0.76 0.63  CALCIUM 8.9  --   GLUCOSE 158*  --    Protein Profile:  Recent Labs Lab 10/23/14 2014  ALBUMIN 3.8    Gastrointestinal Profile: Last BM: 7/7   Weight Change: Pt reports UBW of 165lbs one year ago (8% weight loss in one year), per [facility] weight loss of 4% in the past 3 months.  Anthropometrics:   Height:  Ht Readings from Last 1 Encounters:  10/23/14 '5\' 7"'$  (1.702 m)    Weight:  Wt Readings from Last 1 Encounters:  10/24/14 152 lb (68.947 kg)     Wt Readings from Last 10 Encounters:  10/24/14 152 lb (68.947 kg)  10/15/14 156 lb 9.6 oz (71.033 kg)  09/29/14 157 lb 6.5 oz (71.4 kg)  07/22/14 158 lb 3 oz (71.755 kg)    BMI:  Body mass index is 23.8 kg/(m^2).  Skin:  Reviewed, no issues  EDUCATION NEEDS:  No education needs identified at this time   [place], RD, LDN Pager [phone]

## 2014-10-24 NOTE — H&P (Signed)
[facility] [place] NAME: Timothy Ali    MR#:  [id number]  DATE OF BIRTH:  [birth year]  DATE OF ADMISSION:  10/23/2014  PRIMARY CARE PHYSICIAN: Albina Billet, MD   REQUESTING/REFERRING PHYSICIAN: Darrick Penna  CHIEF COMPLAINT:   Chief Complaint  Patient presents with  . Shortness of Breath  . Abdominal Pain   cough with expectoration of clear sputum Shortness of breath  HISTORY OF PRESENT ILLNESS:  Timothy Ali  is a 81 y.o. male with a known history of COPD/emphysema on home O2, CA left lung status post radiation, GAD/depression, BPH with outlet obstruction, hyperlipidemia was brought in by EMS to the emergency room with the complaints of shortness of breath with hypoxia. Patient mentions that he has been having ongoing cough with expectoration of clear sputum and later in the evening developed acute shortness of breath and was not able to bring up sputum hence was brought to the emergency room for further evaluation. Patient was noted to be hypoxic by the EMS with O2 saturations around 83%. Denies any fever or chills. Does complain of chest pain because of ongoing cough. Denies any nausea, vomiting, abdominal pain, diarrhea. Patient was given DuoNeb's and was placed on oxygen supplementation. Workup revealed elevated white blood cell count of 16.2 and chest x-ray unremarkable for acute pathology. Patient underwent CT of the chest which was reported negative for pulmonary embolism but was noted to have extensive mucoid plugging involving the right medial lobe and right lower lobe bronchial impaction which was opened by the radiologist possible aspiration versus infection. After obtaining blood cultures patient was started on vancomycin, Levaquin and aztreonam by the ED physician and was also given 500 mL of normal saline bolus. Hospitalist service was consulted for further management. Patient currently is currently stable, states doing better.  He mentions that he recently had a CT of the chest done per his primary care practitioner and was advised to consult oncology for some abnormality noted on the CT chest. Patient has an upcoming appointment with oncologist early next week for the follow-up of recent CT chest abnormality.  PAST MEDICAL HISTORY:   Past Medical History  Diagnosis Date  . Major depressive disorder, recurrent, severe with psychotic features   . Persistent disorder of initiating or maintaining sleep   . Generalized anxiety disorder   . Major depressive disorder, recurrent, severe with psychotic features   . Persistent disorder of initiating or maintaining sleep   . Asthma   . Cancer     Left lung  . COPD (chronic obstructive pulmonary disease)     PAST SURGICAL HISTORY:   Past Surgical History  Procedure Laterality Date  . Hemorroidectomy    . Lung surgery      SOCIAL HISTORY:   History  Substance Use Topics  . Smoking status: Former Research scientist (life sciences)  . Smokeless tobacco: Never Used     Comment: quit 18 years ago  . Alcohol Use: No    FAMILY HISTORY:   Family History  Problem Relation Age of Onset  . COPD Mother   . Lung cancer Father     DRUG ALLERGIES:   Allergies  Allergen Reactions  . Penicillins     REVIEW OF SYSTEMS:   Review of Systems  Constitutional: Negative for fever, chills and malaise/fatigue.  HENT: Negative for ear pain, hearing loss, nosebleeds, sore throat and tinnitus.   Eyes: Negative for blurred vision, double vision, pain, discharge and redness.  Respiratory: Positive  for cough, sputum production and shortness of breath. Negative for hemoptysis and wheezing.   Cardiovascular: Negative for chest pain, palpitations, orthopnea and leg swelling.  Gastrointestinal: Negative for nausea, vomiting, abdominal pain, diarrhea, constipation, blood in stool and melena.  Genitourinary: Negative for dysuria, urgency, frequency and hematuria.       History of urinary retention  secondary due to prostate problem, patient on intermittent self catheter.  Musculoskeletal: Negative for back pain, joint pain and neck pain.  Skin: Negative for itching and rash.  Neurological: Negative for dizziness, tingling, sensory change, focal weakness and seizures.  Endo/Heme/Allergies: Does not bruise/bleed easily.  Psychiatric/Behavioral: Positive for depression. The patient is nervous/anxious and has insomnia.     MEDICATIONS AT HOME:   Prior to Admission medications   Medication Sig Start Date End Date Taking? Authorizing Provider  albuterol (PROVENTIL HFA;VENTOLIN HFA) 108 (90 BASE) MCG/ACT inhaler Inhale 1-2 puffs into the lungs QID.    Yes Historical Provider, MD  atorvastatin (LIPITOR) 40 MG tablet Take 1 tablet by mouth daily.   Yes Historical Provider, MD  econazole nitrate 1 % cream Apply 1 application topically 2 (two) times daily.  08/14/14  Yes Historical Provider, MD  esomeprazole (NEXIUM) 40 MG capsule Take 40 mg by mouth daily at 12 noon.   Yes Historical Provider, MD  LORazepam (ATIVAN) 1 MG tablet Take 1 tablet 3 times a day and one half a tablet once a day. 10/15/14  Yes Marjie Skiff, MD  mometasone-formoterol (DULERA) 200-5 MCG/ACT AERO Inhale 2 puffs into the lungs 2 (two) times daily.    Yes Historical Provider, MD  QUEtiapine (SEROQUEL XR) 300 MG 24 hr tablet Take 1 tablet (300 mg total) by mouth at bedtime. 10/15/14  Yes Marjie Skiff, MD  tamsulosin (FLOMAX) 0.4 MG CAPS capsule Take 1 capsule by mouth daily.   Yes Historical Provider, MD  tiotropium (SPIRIVA) 18 MCG inhalation capsule Place into inhaler and inhale daily.   Yes Historical Provider, MD  venlafaxine XR (EFFEXOR-XR) 150 MG 24 hr capsule Take 1 capsule (150 mg total) by mouth daily. 10/15/14  Yes Marjie Skiff, MD  zolpidem (AMBIEN) 10 MG tablet Take 1 tablet by mouth at bedtime. 01/27/14  Yes Historical Provider, MD  esomeprazole (NEXIUM) 40 MG packet Take 1 packet by mouth daily.     Historical Provider, MD  QUEtiapine Fumarate (SEROQUEL XR) 150 MG 24 hr tablet Take 1 tablet by mouth 2 (two) times daily.    Historical Provider, MD  triamcinolone cream (KENALOG) 0.1 %  08/14/14   Historical Provider, MD  venlafaxine XR (EFFEXOR-XR) 75 MG 24 hr capsule Take 1 capsule by mouth daily.    Historical Provider, MD  zolpidem (AMBIEN CR) 12.5 MG CR tablet Take 1 tablet (12.5 mg total) by mouth at bedtime as needed for sleep. Patient not taking: Reported on 10/23/2014 10/15/14   Marjie Skiff, MD      VITAL SIGNS:  Blood pressure 123/61, pulse 92, temperature 97.9 F (36.6 C), temperature source Oral, resp. rate 16, height '5\' 7"'$  (1.702 m), weight 70.761 kg (156 lb), SpO2 96 %.  PHYSICAL EXAMINATION:  Physical Exam  Constitutional: He is oriented to person, place, and time. He appears well-developed and well-nourished. No distress.  HENT:  Head: Normocephalic and atraumatic.  Right Ear: External ear normal.  Left Ear: External ear normal.  Nose: Nose normal.  Mouth/Throat: Oropharynx is clear and moist. No oropharyngeal exudate.  Eyes: EOM are normal. Pupils are equal, round,  and reactive to light. No scleral icterus.  Neck: Normal range of motion. Neck supple. No JVD present. No thyromegaly present.  Cardiovascular: Normal rate, regular rhythm, normal heart sounds and intact distal pulses.  Exam reveals no friction rub.   No murmur heard. Respiratory: Effort normal. No respiratory distress. He has no wheezes. He has rales. He exhibits no tenderness.  GI: Soft. Bowel sounds are normal. He exhibits no distension and no mass. There is no tenderness. There is no rebound and no guarding.  Musculoskeletal: Normal range of motion. He exhibits no edema.  Lymphadenopathy:    He has no cervical adenopathy.  Neurological: He is alert and oriented to person, place, and time. He has normal reflexes. He displays normal reflexes. No cranial nerve deficit. He exhibits normal muscle tone.   Skin: Skin is warm. No rash noted. No erythema.  Psychiatric: He has a normal mood and affect. His behavior is normal. Thought content normal.   LABORATORY PANEL:   CBC  Recent Labs Lab 10/23/14 2014  WBC 16.2*  HGB 12.9*  HCT 39.3*  PLT 195   ------------------------------------------------------------------------------------------------------------------  Chemistries   Recent Labs Lab 10/23/14 2014  NA 141  K 3.5  CL 104  CO2 26  GLUCOSE 158*  BUN 19  CREATININE 0.76  CALCIUM 8.9  AST 24  ALT 17  ALKPHOS 68  BILITOT 0.4   ------------------------------------------------------------------------------------------------------------------  Cardiac Enzymes  Recent Labs Lab 10/23/14 2014  TROPONINI <0.03   ------------------------------------------------------------------------------------------------------------------  RADIOLOGY:  Ct Angio Chest Pe W/cm &/or Wo Cm  10/23/2014   CLINICAL DATA:  Lung cancer and shortness of breath. Evaluate for pulmonary embolism.  EXAM: CT ANGIOGRAPHY CHEST WITH CONTRAST  TECHNIQUE: Multidetector CT imaging of the chest was performed using the standard protocol during bolus administration of intravenous contrast. Multiplanar CT image reconstructions and MIPs were obtained to evaluate the vascular anatomy.  CONTRAST:  53m OMNIPAQUE IOHEXOL 350 MG/ML SOLN  COMPARISON:  10/13/2014  FINDINGS: THORACIC INLET/BODY WALL:  No acute abnormality.  MEDIASTINUM:  Normal heart size. No pericardial effusion. Diffuse atherosclerosis, including the coronary arteries. CTA of the pulmonary arteries is limited by intermittent motion, but exam is overall diagnostic and negative for pulmonary embolism. No aortic dissection or aneurysm. No lymphadenopathy.  LUNG WINDOWS:  Stable lytic metastasis within the left T3 body and posterior elements also involving the left third and fourth rib head and necks.  There is treatment change with scarring on the left.   Emphysema.  Extensive mucoid impaction in segmental airway is of the medial right lower lobe. No pneumonia, edema, effusion, or pneumothorax.  UPPER ABDOMEN:  No acute findings.  OSSEOUS:  Metastatic disease as noted above. There is spondyloarthropathy with bridging osteophytes throughout the mid lower thoracic spine  Review of the MIP images confirms the above findings.  IMPRESSION: 1. No evidence of pulmonary embolism. 2. Right lower lobe bronchial impaction which could be from aspiration or airway infection. 3. Stable metastasis eroding the left T3 vertebra and third, fourth ribs. 4. Emphysema.   Electronically Signed   By: JMonte FantasiaM.D.   On: 10/23/2014 22:35   Dg Chest Portable 1 View  10/23/2014   CLINICAL DATA:  Shortness of Breath.  Right-sided chest pain  EXAM: PORTABLE CHEST - 1 VIEW  COMPARISON:  Chest CT October 13, 2014; chest radiograph August 12, 2014  FINDINGS: There are scattered areas of scarring bilaterally, essentially stable. There is no appreciable edema or consolidation. The heart size is normal. Pulmonary vascularity  is stable and within normal limits. No adenopathy. Soft tissue prominence in the medial left apex from the in known paraspinal mass at T3-4 on the left side is again noted but better seen on recent CT.  IMPRESSION: The known mass on the left in the T3-4 paraspinous region is present but better seen on recent CT. Areas of parenchymal lung scarring are stable. There is no demonstrable lung edema or consolidation. No change in cardiac silhouette.   Electronically Signed   By: Lowella Grip III M.D.   On: 10/23/2014 20:31    EKG:   Orders placed or performed in visit on 04/09/14  . EKG 12-Lead sinus tachycardia with ventricular rate of 116 bpm, ST and T wave abnormality in anterior lateral leads.     IMPRESSION AND PLAN:   1. Acute on chronic respiratory failure secondary to extensive mucoid plugging. 2. Right lower lobe bronchial impaction-aspiration versus  infection. 3. Leukocytosis, possible pneumonia. 4. COPD, stable on home medications. 5. History of carcinoma left lung status post radiation. 6. BPH with outflow obstruction on intermittent self catheterization.  7. Hyperlipidemia, on atorvastatin. 8. GAD/depression, stable on home medications.  Plan: Admit to [facility], continue O2 supplementation, follow-up O2 sats, vigorous DuoNeb's, continue home medications for COPD-Spiriva and Dulera, continue IV antibiotics- Aztreonam and Levaquin, follow-up blood cultures and sputum cultures. -Oncology consultation for further advice on history of lung cancer. -Continue home medications including atorvastatin, antidepressants.    All the records are reviewed and case discussed with ED provider. Management plans discussed with the patient, family and they are in agreement.  CODE STATUS: Full code  TOTAL TIME TAKING CARE OF THIS PATIENT: 50 minutes.    Azucena Freed N M.D on 10/24/2014 at 12:03 AM  Between 7am to 6pm - Pager - [phone]  After 6pm go to [email or web address] - password [email or web address] [facility]  Office  [phone]  CC: Primary care physician; Albina Billet, MD

## 2014-10-24 NOTE — Consult Note (Signed)
[place]  [phone]  [phone]     Timothy Ali DOB: 1929/10/20  MR#: [id number]  QQI#:[id number]  Patient Care Team: Albina Billet, MD as PCP - General (Internal Medicine)  CHIEF COMPLAINT:  Chief Complaint  Patient presents with  . Shortness of Breath  . Abdominal Pain    INTERVAL HISTORY:  Patient is an 81 year old male with a known history of COPD/emphysema on home O2 in the evenings only, history of left long cancer status post radiation treatment approximately 15 months ago, stage IA. He was brought into the emergency room via EMS with complaints of increasing shortness of breath and hypoxia, cough, and increasing sputum production. He denies any complaints of pain today. He has some area under the left arm that are itchy. CT scan of the chest is reported as negative for pulmonary embolism upon admission to the ER that was noted to have extensive mucoid plugging involving the right medial lobe and right lower lobe, aspiration versus infection. He has been recently evaluated by Dr. Baruch Gouty in the outpatient setting. Has had a recent CT scan of the chest which revealed progression of the lesion arising at the T3-4 on the left in the paraspinous region. There is also destruction of portions of the left posterior third rib as well as portions of the T3 vertebral body and posterior elements on the left. No involvement of the spinal cord. No parenchymal lung mass or adenopathy appreciable. This CT scan was performed on 10/13/2014. He was scheduled for follow-up with Dr. Baruch Gouty as well as with Dr. Oliva Bustard for Wednesday, July 13.  REVIEW OF SYSTEMS:   Review of Systems  Constitutional: Negative for fever, chills, weight loss, malaise/fatigue and diaphoresis.  HENT: Negative for congestion, ear discharge, ear pain, hearing loss, nosebleeds, sore throat and tinnitus.   Eyes: Negative for blurred vision, double vision, photophobia, pain, discharge and  redness.  Respiratory: Positive for cough, sputum production and shortness of breath. Negative for hemoptysis, wheezing and stridor.   Cardiovascular: Negative for chest pain, palpitations, orthopnea, claudication, leg swelling and PND.  Gastrointestinal: Negative for heartburn, nausea, vomiting, abdominal pain, diarrhea, constipation, blood in stool and melena.  Genitourinary: Negative.   Musculoskeletal: Negative.   Skin: Positive for itching.  Neurological: Positive for weakness. Negative for dizziness, tingling, focal weakness, seizures and headaches.  Endo/Heme/Allergies: Does not bruise/bleed easily.  Psychiatric/Behavioral: Negative for depression. The patient is not nervous/anxious and does not have insomnia.     As per HPI. Otherwise, a complete review of systems is negatve.  ONCOLOGY HISTORY:  No history exists.    PAST MEDICAL HISTORY: Past Medical History  Diagnosis Date  . Major depressive disorder, recurrent, severe with psychotic features   . Persistent disorder of initiating or maintaining sleep   . Generalized anxiety disorder   . Major depressive disorder, recurrent, severe with psychotic features   . Persistent disorder of initiating or maintaining sleep   . Asthma   . Cancer     Left lung  . COPD (chronic obstructive pulmonary disease)     PAST SURGICAL HISTORY: Past Surgical History  Procedure Laterality Date  . Hemorroidectomy    . Lung surgery      FAMILY HISTORY Family History  Problem Relation Age of Onset  . COPD Mother   . Lung cancer Father     GYNECOLOGIC HISTORY:  No LMP for male patient.     ADVANCED DIRECTIVES:    HEALTH MAINTENANCE: History  Substance Use  Topics  . Smoking status: Former Research scientist (life sciences)  . Smokeless tobacco: Never Used     Comment: quit 18 years ago  . Alcohol Use: No     Colonoscopy:  PAP:  Bone density:  Lipid panel:  Allergies  Allergen Reactions  . Penicillins     Current Facility-Administered  Medications  Medication Dose Route Frequency Provider Last Rate Last Dose  . acetaminophen (TYLENOL) tablet 650 mg  650 mg Oral Q6H PRN Juluis Mire, MD       Or  . acetaminophen (TYLENOL) suppository 650 mg  650 mg Rectal Q6H PRN Juluis Mire, MD      . albuterol (PROVENTIL) (2.5 MG/3ML) 0.083% nebulizer solution 2.5 mg  2.5 mg Nebulization Q2H PRN Juluis Mire, MD      . aspirin EC tablet 81 mg  81 mg Oral Daily Juluis Mire, MD   81 mg at 10/24/14 1000  . atorvastatin (LIPITOR) tablet 40 mg  40 mg Oral Daily Juluis Mire, MD   40 mg at 10/24/14 1306  . [START ON 10/25/2014] azithromycin (ZITHROMAX) tablet 250 mg  250 mg Oral Daily Fritzi Mandes, MD      . cefTRIAXone (ROCEPHIN) 1 g in dextrose 5 % 50 mL IVPB - Premix  1 g Intravenous Q24H Fritzi Mandes, MD   1 g at 10/24/14 0830  . enoxaparin (LOVENOX) injection 40 mg  40 mg Subcutaneous Q24H Juluis Mire, MD   40 mg at 10/24/14 1307  . LORazepam (ATIVAN) tablet 0.5 mg  0.5 mg Oral Q4H PRN Evlyn Kanner, NP      . LORazepam (ATIVAN) tablet 1 mg  1 mg Oral TID Juluis Mire, MD   1 mg at 10/24/14 1306  . mometasone-formoterol (DULERA) 200-5 MCG/ACT inhaler 2 puff  2 puff Inhalation BID Juluis Mire, MD      . ondansetron [facility]) tablet 4 mg  4 mg Oral Q6H PRN Juluis Mire, MD       Or  . ondansetron [facility]) injection 4 mg  4 mg Intravenous Q6H PRN Juluis Mire, MD      . pantoprazole (PROTONIX) EC tablet 40 mg  40 mg Oral Daily Juluis Mire, MD   40 mg at 10/24/14 1306  . QUEtiapine (SEROQUEL XR) 24 hr tablet 150 mg  150 mg Oral BID Juluis Mire, MD      . tamsulosin [facility]) capsule 0.4 mg  0.4 mg Oral Daily Juluis Mire, MD   0.4 mg at 10/24/14 1305  . tiotropium (SPIRIVA) inhalation capsule 18 mcg  18 mcg Inhalation Daily Juluis Mire, MD      . venlafaxine XR (EFFEXOR-XR) 24 hr capsule 75 mg  75 mg Oral Daily Juluis Mire, MD   75 mg at 10/24/14 1306  . zolpidem (AMBIEN) tablet 5 mg   5 mg Oral QHS Juluis Mire, MD   5 mg at 10/24/14 0129    OBJECTIVE: BP 157/63 mmHg  Pulse 81  Temp(Src) 98.2 F (36.8 C) (Oral)  Resp 18  Ht 5' 7"  (1.702 m)  Wt 152 lb (68.947 kg)  BMI 23.80 kg/m2  SpO2 94%   Body mass index is 23.8 kg/(m^2).    ECOG FS:2 - Symptomatic, <50% confined to bed  General: Well-developed, well-nourished, no acute distress. Eyes: Pink conjunctiva, anicteric sclera. HEENT: Normocephalic, moist mucous membranes, clear oropharnyx. Lungs: Decreased breath sounds bilaterally. No wheezing, rales, rhonchi. Heart: Regular rate and rhythm.  No rubs, murmurs, or gallops. Abdomen: Soft, nontender, nondistended. No organomegaly noted, normoactive bowel sounds. Musculoskeletal: No edema, cyanosis, or clubbing. Neuro: Alert, answering all questions appropriately. Cranial nerves grossly intact. Skin: No rashes or petechiae noted. Psych: Normal affect. Lymphatics: No cervical, calvicular, axillary or inguinal LAD.   LAB RESULTS:  Admission on 10/23/2014  Component Date Value Ref Range Status  . WBC 10/23/2014 16.2* 3.8 - 10.6 K/uL Final  . RBC 10/23/2014 4.23* 4.40 - 5.90 MIL/uL Final  . Hemoglobin 10/23/2014 12.9* 13.0 - 18.0 g/dL Final  . HCT 10/23/2014 39.3* 40.0 - 52.0 % Final  . MCV 10/23/2014 92.8  80.0 - 100.0 fL Final  . MCH 10/23/2014 30.4  26.0 - 34.0 pg Final  . MCHC 10/23/2014 32.8  32.0 - 36.0 g/dL Final  . RDW 10/23/2014 14.7* 11.5 - 14.5 % Final  . Platelets 10/23/2014 195  150 - 440 K/uL Final  . Neutrophils Relative % 10/23/2014 84   Final  . Neutro Abs 10/23/2014 13.7* 1.4 - 6.5 K/uL Final  . Lymphocytes Relative 10/23/2014 6   Final  . Lymphs Abs 10/23/2014 1.0  1.0 - 3.6 K/uL Final  . Monocytes Relative 10/23/2014 9   Final  . Monocytes Absolute 10/23/2014 1.4* 0.2 - 1.0 K/uL Final  . Eosinophils Relative 10/23/2014 1   Final  . Eosinophils Absolute 10/23/2014 0.1  0 - 0.7 K/uL Final  . Basophils Relative 10/23/2014 0   Final  .  Basophils Absolute 10/23/2014 0.0  0 - 0.1 K/uL Final  . Sodium 10/23/2014 141  135 - 145 mmol/L Final  . Potassium 10/23/2014 3.5  3.5 - 5.1 mmol/L Final  . Chloride 10/23/2014 104  101 - 111 mmol/L Final  . CO2 10/23/2014 26  22 - 32 mmol/L Final  . Glucose, Bld 10/23/2014 158* 65 - 99 mg/dL Final  . BUN 10/23/2014 19  6 - 20 mg/dL Final  . Creatinine, Ser 10/23/2014 0.76  0.61 - 1.24 mg/dL Final  . Calcium 10/23/2014 8.9  8.9 - 10.3 mg/dL Final  . Total Protein 10/23/2014 7.2  6.5 - 8.1 g/dL Final  . Albumin 10/23/2014 3.8  3.5 - 5.0 g/dL Final  . AST 10/23/2014 24  15 - 41 U/L Final  . ALT 10/23/2014 17  17 - 63 U/L Final  . Alkaline Phosphatase 10/23/2014 68  38 - 126 U/L Final  . Total Bilirubin 10/23/2014 0.4  0.3 - 1.2 mg/dL Final  . GFR calc non Af Amer 10/23/2014 >60  >60 mL/min Final  . GFR calc Af Amer 10/23/2014 >60  >60 mL/min Final   Comment: (NOTE) The eGFR has been calculated using the CKD EPI equation. This calculation has not been validated in all clinical situations. eGFR's persistently <60 mL/min signify possible Chronic Kidney Disease.   . Anion gap 10/23/2014 11  5 - 15 Final  . Troponin I 10/23/2014 <0.03  <0.031 ng/mL Final   Comment:        NO INDICATION OF MYOCARDIAL INJURY.   . B Natriuretic Peptide 10/23/2014 54.0  0.0 - 100.0 pg/mL Final  . Lactic Acid, Venous 10/23/2014 1.6  0.5 - 2.0 mmol/L Final  . Specimen Description 10/23/2014 BLOOD   Final  . Special Requests 10/23/2014 [facility]   Final  . Culture 10/23/2014 NO GROWTH < 12 HOURS   Final  . Report Status 10/23/2014 PENDING   Incomplete  . Creatinine, Ser 10/24/2014 0.63  0.61 - 1.24 mg/dL Final  . GFR calc non Af Wyvonnia Lora  10/24/2014 >60  >60 mL/min Final  . GFR calc Af Amer 10/24/2014 >60  >60 mL/min Final   Comment: (NOTE) The eGFR has been calculated using the CKD EPI equation. This calculation has not been validated in all clinical situations. eGFR's persistently <60 mL/min signify  possible Chronic Kidney Disease.   . WBC 10/24/2014 13.8* 3.8 - 10.6 K/uL Final  . RBC 10/24/2014 3.84* 4.40 - 5.90 MIL/uL Final  . Hemoglobin 10/24/2014 12.0* 13.0 - 18.0 g/dL Final  . HCT 10/24/2014 35.9* 40.0 - 52.0 % Final  . MCV 10/24/2014 93.5  80.0 - 100.0 fL Final  . MCH 10/24/2014 31.3  26.0 - 34.0 pg Final  . MCHC 10/24/2014 33.5  32.0 - 36.0 g/dL Final  . RDW 10/24/2014 14.7* 11.5 - 14.5 % Final  . Platelets 10/24/2014 167  150 - 440 K/uL Final    STUDIES: Ct Angio Chest Pe W/cm &/or Wo Cm  10/23/2014   CLINICAL DATA:  Lung cancer and shortness of breath. Evaluate for pulmonary embolism.  EXAM: CT ANGIOGRAPHY CHEST WITH CONTRAST  TECHNIQUE: Multidetector CT imaging of the chest was performed using the standard protocol during bolus administration of intravenous contrast. Multiplanar CT image reconstructions and MIPs were obtained to evaluate the vascular anatomy.  CONTRAST:  73m OMNIPAQUE IOHEXOL 350 MG/ML SOLN  COMPARISON:  10/13/2014  FINDINGS: THORACIC INLET/BODY WALL:  No acute abnormality.  MEDIASTINUM:  Normal heart size. No pericardial effusion. Diffuse atherosclerosis, including the coronary arteries. CTA of the pulmonary arteries is limited by intermittent motion, but exam is overall diagnostic and negative for pulmonary embolism. No aortic dissection or aneurysm. No lymphadenopathy.  LUNG WINDOWS:  Stable lytic metastasis within the left T3 body and posterior elements also involving the left third and fourth rib head and necks.  There is treatment change with scarring on the left.  Emphysema.  Extensive mucoid impaction in segmental airway is of the medial right lower lobe. No pneumonia, edema, effusion, or pneumothorax.  UPPER ABDOMEN:  No acute findings.  OSSEOUS:  Metastatic disease as noted above. There is spondyloarthropathy with bridging osteophytes throughout the mid lower thoracic spine  Review of the MIP images confirms the above findings.  IMPRESSION: 1. No evidence of  pulmonary embolism. 2. Right lower lobe bronchial impaction which could be from aspiration or airway infection. 3. Stable metastasis eroding the left T3 vertebra and third, fourth ribs. 4. Emphysema.   Electronically Signed   By: JMonte FantasiaM.D.   On: 10/23/2014 22:35   Dg Chest Portable 1 View  10/23/2014   CLINICAL DATA:  Shortness of Breath.  Right-sided chest pain  EXAM: PORTABLE CHEST - 1 VIEW  COMPARISON:  Chest CT October 13, 2014; chest radiograph August 12, 2014  FINDINGS: There are scattered areas of scarring bilaterally, essentially stable. There is no appreciable edema or consolidation. The heart size is normal. Pulmonary vascularity is stable and within normal limits. No adenopathy. Soft tissue prominence in the medial left apex from the in known paraspinal mass at T3-4 on the left side is again noted but better seen on recent CT.  IMPRESSION: The known mass on the left in the T3-4 paraspinous region is present but better seen on recent CT. Areas of parenchymal lung scarring are stable. There is no demonstrable lung edema or consolidation. No change in cardiac silhouette.   Electronically Signed   By: WLowella GripIII M.D.   On: 10/23/2014 20:31    ASSESSMENT:  History of carcinoma of left lung  status post radiation approximately 15 months ago, stage IA.  PLAN:   1. Lung CA. Discussed at length with Dr. Baruch Gouty as well as Dr. Oliva Bustard. Patient with significant history for left lung cancer status post radiation a little over a year ago. He at that time did not want to do chemotherapy per patient. CT findings now consistent with metastases to the vertebrae and ribs. Discussed this patient, progressive disease can likely be treated with radiation and chemotherapy may be of consideration. All treatments would be considered palliative in nature and this was discussed with patient at length. Offered to call and discuss with his daughters that he did not wish to proceed with this yet.  Acute on  chronic hypoxia with respiratory failure. Extensive mucoid plugging and possible right lower lobe pneumonia versus aspiration. Would continue the IV antibiotics, nebulizers, oxygen.   Patient advised that if he should be discharged from the hospital at any time over the weekend he should keep his previously scheduled appointments on Wednesday, July 13 with Dr. Baruch Gouty and Dr. Oliva Bustard. Also advised patient to see if a family member could be present at time of appointments for support.  Patient expressed understanding and was in agreement with this plan. He also understands that He can call clinic at any time with any questions, concerns, or complaints.   Dr. Oliva Bustard was available for consultation and review of plan of care for this patient.   Evlyn Kanner, NP   10/24/2014 5:53 PM

## 2014-10-25 MED ORDER — CEFUROXIME AXETIL 500 MG PO TABS: 500.0000 mg | ORAL_TABLET | Freq: Two times a day (BID) | ORAL | Status: DC

## 2014-10-25 MED ORDER — AZITHROMYCIN 250 MG PO TABS: ORAL_TABLET | ORAL | Status: DC

## 2014-10-25 MED ORDER — CEFUROXIME AXETIL 500 MG PO TABS
500.0000 mg | ORAL_TABLET | Freq: Two times a day (BID) | ORAL | Status: DC
  Filled 2014-10-25 (×2): qty 1

## 2014-10-25 NOTE — Discharge Instructions (Signed)
Use your oxygen as before

## 2014-10-25 NOTE — Plan of Care (Signed)
Problem: Discharge Progression Outcomes Goal: Other Discharge Outcomes/Goals Outcome: Progressing VSS. 3L O2 continues. No c/o sob. Ativan given once for anxiety with improvement. Denies pain.

## 2014-10-25 NOTE — Progress Notes (Signed)
MD making rounds. Discharge orders received. IV sites removed. Prescriptions called to pharmacy. Discharge paperwork provided, explained, signed and witnessed. No unanswered questions. Discharged via wheelchair by nursing staff. Belongings sent with patient and family.

## 2014-10-25 NOTE — Discharge Summary (Signed)
[facility] NAME: Timothy Ali    MR#:  [id number]  DATE OF BIRTH:  1929-08-31  DATE OF ADMISSION:  10/23/2014 ADMITTING PHYSICIAN: Juluis Mire, MD  DATE OF DISCHARGE: 10/25/14  PRIMARY CARE PHYSICIAN: Albina Billet, MD    ADMISSION DIAGNOSIS:  Mucoid impaction of bronchi [J98.09] SOB (shortness of breath) [R06.02] Chest pain, unspecified chest pain type [R07.9]  DISCHARGE DIAGNOSIS:  Metastatic Lung cancer Right LL pneumonia with mucoid plugging Chronic respiratory failure on home oxygen  SECONDARY DIAGNOSIS:   Past Medical History  Diagnosis Date  . Major depressive disorder, recurrent, severe with psychotic features   . Persistent disorder of initiating or maintaining sleep   . Generalized anxiety disorder   . Major depressive disorder, recurrent, severe with psychotic features   . Persistent disorder of initiating or maintaining sleep   . Asthma   . Cancer     Left lung  . COPD (chronic obstructive pulmonary disease)     HOSPITAL COURSE:   81 y.o. male with a known history of COPD/emphysema on home O2, CA left lung status post radiation, GAD/depression, BPH with outlet obstruction, hyperlipidemia was brought in by EMS to the emergency room with the complaints of shortness of breath with hypoxia.pt has been having ongoing cough with expectoration of clear sputum and later in the evening developed acute shortness of breath and was not able to bring up sputum hence was brought to the emergency room for further evaluation. Patient was noted to be hypoxic by the EMS with O2 saturations around 83%  1. Acute on chronic hypoxic respiratory failure secondary to extensive mucoid plugging and possible right LL Pneumonia -IV ceftriaxone and po zithromax--->change to po abxs  -prn nebs, inhalers -incentive spirometer  2. Right lower lobe bronchial impaction-aspiration versus infection. -cont above rx  3.  Leukocytosis, possible pneumonia.  4. COPD, stable on home medications -no flare. Hold of steorids . 5. History of carcinoma left lung status post radiation. now CT findings s/o mets to vertebrae and ribs Oncology with Georgeanne Nim, NP noted -pt will f/u Dr Baruch Gouty and Dr Oliva Bustard if he decides to get palliative treatment  6. BPH with outflow obstruction on intermittent self catheterization.   7. Hyperlipidemia, on atorvastatin.  8. GAD/depression, stable on home medications.  9.PT rec no needs at home Pt requesting to go home. He is stable at present for d/c  DISCHARGE CONDITIONS:   fiar CONSULTS OBTAINED:  Treatment Team:  Forest Gleason, MD  DRUG ALLERGIES:   Allergies  Allergen Reactions  . Penicillins     DISCHARGE MEDICATIONS:   Current Discharge Medication List    START taking these medications   Details  azithromycin (ZITHROMAX) 250 MG tablet 1 tab po daily for 3 days Qty: 6 each, Refills: 0    cefUROXime (CEFTIN) 500 MG tablet Take 1 tablet (500 mg total) by mouth 2 (two) times daily with a meal. Qty: 10 tablet, Refills: 0      CONTINUE these medications which have NOT CHANGED   Details  albuterol (PROVENTIL HFA;VENTOLIN HFA) 108 (90 BASE) MCG/ACT inhaler Inhale 1-2 puffs into the lungs QID.     atorvastatin (LIPITOR) 40 MG tablet Take 1 tablet by mouth daily.    econazole nitrate 1 % cream Apply 1 application topically 2 (two) times daily.     esomeprazole (NEXIUM) 40 MG capsule Take 40 mg by mouth daily at 12 noon.    LORazepam (ATIVAN) 1  MG tablet Take 1 tablet 3 times a day and one half a tablet once a day. Qty: 315 tablet, Refills: 0    mometasone-formoterol (DULERA) 200-5 MCG/ACT AERO Inhale 2 puffs into the lungs 2 (two) times daily.     !! QUEtiapine (SEROQUEL XR) 300 MG 24 hr tablet Take 1 tablet (300 mg total) by mouth at bedtime. Qty: 90 tablet, Refills: 0    tamsulosin (FLOMAX) 0.4 MG CAPS capsule Take 1 capsule by mouth daily.     tiotropium (SPIRIVA) 18 MCG inhalation capsule Place into inhaler and inhale daily.    !! venlafaxine XR (EFFEXOR-XR) 150 MG 24 hr capsule Take 1 capsule (150 mg total) by mouth daily. Qty: 90 capsule, Refills: 0    zolpidem (AMBIEN) 10 MG tablet Take 1 tablet by mouth at bedtime.    esomeprazole (NEXIUM) 40 MG packet Take 1 packet by mouth daily.    !! QUEtiapine Fumarate (SEROQUEL XR) 150 MG 24 hr tablet Take 1 tablet by mouth 2 (two) times daily.    triamcinolone cream (KENALOG) 0.1 %     !! venlafaxine XR (EFFEXOR-XR) 75 MG 24 hr capsule Take 1 capsule by mouth daily.    zolpidem (AMBIEN CR) 12.5 MG CR tablet Take 1 tablet (12.5 mg total) by mouth at bedtime as needed for sleep. Qty: 90 tablet, Refills: 0     !! - Potential duplicate medications found. Please discuss with provider.      If you experience worsening of your admission symptoms, develop shortness of breath, life threatening emergency, suicidal or homicidal thoughts you must seek medical attention immediately by calling 911 or calling your MD immediately  if symptoms less severe.  You Must read complete instructions/literature along with all the possible adverse reactions/side effects for all the Medicines you take and that have been prescribed to you. Take any new Medicines after you have completely understood and accept all the possible adverse reactions/side effects.   Please note  You were cared for by a hospitalist during your hospital stay. If you have any questions about your discharge medications or the care you received while you were in the hospital after you are discharged, you can call the unit and asked to speak with the hospitalist on call if the hospitalist that took care of you is not available. Once you are discharged, your primary care physician will handle any further medical issues. Please note that NO REFILLS for any discharge medications will be authorized once you are discharged, as it is  imperative that you return to your primary care physician (or establish a relationship with a primary care physician if you do not have one) for your aftercare needs so that they can reassess your need for medications and monitor your lab values. Today   SUBJECTIVE   Feels ok. Some anxiety  VITAL SIGNS:  Blood pressure 103/55, pulse 92, temperature 97.8 F (36.6 C), temperature source Oral, resp. rate 20, height '5\' 7"'$  (1.702 m), weight 67.223 kg (148 lb 3.2 oz), SpO2 93 %.  I/O:   Intake/Output Summary (Last 24 hours) at 10/25/14 1040 Last data filed at 10/25/14 0800  Gross per 24 hour  Intake    480 ml  Output   2250 ml  Net  -1770 ml    PHYSICAL EXAMINATION:  GENERAL:  81 y.o.-year-old patient lying in the bed with no acute distress.  EYES: Pupils equal, round, reactive to light and accommodation. No scleral icterus. Extraocular muscles intact.  HEENT: Head atraumatic, normocephalic.  Oropharynx and nasopharynx clear.  NECK:  Supple, no jugular venous distention. No thyroid enlargement, no tenderness.  LUNGS: Normal breath sounds bilaterally, no wheezing, rales,rhonchi or crepitation. No use of accessory muscles of respiration.  CARDIOVASCULAR: S1, S2 normal. No murmurs, rubs, or gallops.  ABDOMEN: Soft, non-tender, non-distended. Bowel sounds present. No organomegaly or mass.  EXTREMITIES: No pedal edema, cyanosis, or clubbing.  NEUROLOGIC: Cranial nerves II through XII are intact. Muscle strength 5/5 in all extremities. Sensation intact. Gait not checked.  PSYCHIATRIC: The patient is alert and oriented x 3.  SKIN: No obvious rash, lesion, or ulcer.   DATA REVIEW:   CBC   Recent Labs Lab 10/24/14 0420  WBC 13.8*  HGB 12.0*  HCT 35.9*  PLT 167    Chemistries   Recent Labs Lab 10/23/14 2014 10/24/14 0420  NA 141  --   K 3.5  --   CL 104  --   CO2 26  --   GLUCOSE 158*  --   BUN 19  --   CREATININE 0.76 0.63  CALCIUM 8.9  --   AST 24  --   ALT 17  --    ALKPHOS 68  --   BILITOT 0.4  --     Microbiology Results   Recent Results (from the past 240 hour(s))  Blood culture (routine x 2)     Status: None (Preliminary result)   Collection Time: 10/23/14  8:14 PM  Result Value Ref Range Status   Specimen Description BLOOD  Final   Special Requests [id number]  Final   Culture NO GROWTH < 12 HOURS  Final   Report Status PENDING  Incomplete    RADIOLOGY:  Ct Angio Chest Pe W/cm &/or Wo Cm  10/23/2014   CLINICAL DATA:  Lung cancer and shortness of breath. Evaluate for pulmonary embolism.  EXAM: CT ANGIOGRAPHY CHEST WITH CONTRAST  TECHNIQUE: Multidetector CT imaging of the chest was performed using the standard protocol during bolus administration of intravenous contrast. Multiplanar CT image reconstructions and MIPs were obtained to evaluate the vascular anatomy.  CONTRAST:  67m OMNIPAQUE IOHEXOL 350 MG/ML SOLN  COMPARISON:  10/13/2014  FINDINGS: THORACIC INLET/BODY WALL:  No acute abnormality.  MEDIASTINUM:  Normal heart size. No pericardial effusion. Diffuse atherosclerosis, including the coronary arteries. CTA of the pulmonary arteries is limited by intermittent motion, but exam is overall diagnostic and negative for pulmonary embolism. No aortic dissection or aneurysm. No lymphadenopathy.  LUNG WINDOWS:  Stable lytic metastasis within the left T3 body and posterior elements also involving the left third and fourth rib head and necks.  There is treatment change with scarring on the left.  Emphysema.  Extensive mucoid impaction in segmental airway is of the medial right lower lobe. No pneumonia, edema, effusion, or pneumothorax.  UPPER ABDOMEN:  No acute findings.  OSSEOUS:  Metastatic disease as noted above. There is spondyloarthropathy with bridging osteophytes throughout the mid lower thoracic spine  Review of the MIP images confirms the above findings.  IMPRESSION: 1. No evidence of pulmonary embolism. 2. Right lower lobe bronchial impaction  which could be from aspiration or airway infection. 3. Stable metastasis eroding the left T3 vertebra and third, fourth ribs. 4. Emphysema.   Electronically Signed   By: JMonte FantasiaM.D.   On: 10/23/2014 22:35   Dg Chest Portable 1 View  10/23/2014   CLINICAL DATA:  Shortness of Breath.  Right-sided chest pain  EXAM: PORTABLE CHEST - 1 VIEW  COMPARISON:  Chest  CT October 13, 2014; chest radiograph August 12, 2014  FINDINGS: There are scattered areas of scarring bilaterally, essentially stable. There is no appreciable edema or consolidation. The heart size is normal. Pulmonary vascularity is stable and within normal limits. No adenopathy. Soft tissue prominence in the medial left apex from the in known paraspinal mass at T3-4 on the left side is again noted but better seen on recent CT.  IMPRESSION: The known mass on the left in the T3-4 paraspinous region is present but better seen on recent CT. Areas of parenchymal lung scarring are stable. There is no demonstrable lung edema or consolidation. No change in cardiac silhouette.   Electronically Signed   By: Lowella Grip III M.D.   On: 10/23/2014 20:31     Management plans discussed with the patient, family and they are in agreement.  CODE STATUS:     Code Status Orders        Start     Ordered   10/24/14 0049  Full code   Continuous     10/24/14 0048      TOTAL TIME TAKING CARE OF THIS PATIENT:40 minutes.    Tarvares Lant M.D on 10/25/2014 at 10:40 AM  Between 7am to 6pm - Pager - [phone] After 6pm go to [email or web address] - password [email or web address] [facility]  Office  [phone]  CC: Primary care physician; Albina Billet, MD

## 2014-10-27 MED ORDER — ZOLPIDEM TARTRATE 10 MG PO TABS: 10.0000 mg | ORAL_TABLET | Freq: Every day | ORAL | Status: DC

## 2014-10-27 NOTE — Telephone Encounter (Signed)
spoke with patient, left him know that rx was faxed and hopefully the insurance will cover the medication. pt wanted me to let you know that he was in the hospital from thur evening to saturday. pt states that he has cancer of his spine and rib cage.

## 2014-10-27 NOTE — Telephone Encounter (Signed)
Patient's insurance denied coverage for the extended release form. We will continue patient on his previous immediate release form. Will prescribe Ambien 10 mg at bedtime, #30 with 2 refills.

## 2014-10-28 LAB — CULTURE, BLOOD (ROUTINE X 2): CULTURE: NO GROWTH

## 2014-10-29 ENCOUNTER — Ambulatory Visit: Payer: Medicare HMO | Admitting: Radiation Oncology

## 2014-10-29 ENCOUNTER — Encounter: Payer: Self-pay | Admitting: Radiation Oncology

## 2014-10-29 ENCOUNTER — Ambulatory Visit
Admission: RE | Admit: 2014-10-29 | Discharge: 2014-10-29 | Disposition: A | Discharge status: Home / Self Care | Payer: Medicare HMO | Source: Ambulatory Visit | Attending: Radiation Oncology | Admitting: Radiation Oncology

## 2014-10-29 ENCOUNTER — Encounter: Payer: Self-pay | Admitting: Oncology

## 2014-10-29 ENCOUNTER — Inpatient Hospital Stay: Payer: Medicare HMO | Attending: Oncology | Admitting: Oncology

## 2014-10-29 VITALS — BP 138/74 | HR 102 | Temp 96.3°F | Resp 18 | Wt 153.6 lb

## 2014-10-29 VITALS — BP 122/68 | HR 101 | Temp 96.6°F | Wt 153.7 lb

## 2014-10-29 DIAGNOSIS — Z5111 Encounter for antineoplastic chemotherapy: Secondary | ICD-10-CM | POA: Diagnosis not present

## 2014-10-29 DIAGNOSIS — C3412 Malignant neoplasm of upper lobe, left bronchus or lung: Secondary | ICD-10-CM | POA: Diagnosis not present

## 2014-10-29 DIAGNOSIS — Z923 Personal history of irradiation: Secondary | ICD-10-CM | POA: Diagnosis not present

## 2014-10-29 DIAGNOSIS — C3400 Malignant neoplasm of unspecified main bronchus: Secondary | ICD-10-CM

## 2014-10-29 DIAGNOSIS — Z87891 Personal history of nicotine dependence: Secondary | ICD-10-CM | POA: Diagnosis not present

## 2014-10-29 DIAGNOSIS — M899 Disorder of bone, unspecified: Secondary | ICD-10-CM | POA: Insufficient documentation

## 2014-10-29 DIAGNOSIS — C7951 Secondary malignant neoplasm of bone: Secondary | ICD-10-CM | POA: Diagnosis not present

## 2014-10-29 DIAGNOSIS — Z79899 Other long term (current) drug therapy: Secondary | ICD-10-CM | POA: Diagnosis not present

## 2014-10-29 DIAGNOSIS — Z510 Encounter for antineoplastic radiation therapy: Secondary | ICD-10-CM | POA: Insufficient documentation

## 2014-10-29 DIAGNOSIS — Z85118 Personal history of other malignant neoplasm of bronchus and lung: Secondary | ICD-10-CM

## 2014-10-29 LAB — CULTURE, BLOOD (ROUTINE X 2): Culture: NO GROWTH

## 2014-10-29 NOTE — Progress Notes (Signed)
Radiation Oncology Follow up Note  Name: Timothy Ali   Date:   10/29/2014 MRN:  [id number] DOB: Oct 12, 1929    This 81 y.o. male presents to the clinic today for lung cancer with new lesion left lung abutting spinal column.  REFERRING PROVIDER: Albina Billet, MD  HPI: patient is a 81 year old male well known to our department having received SB RT radiation therapy to a T1 lesion of the left lung superior segment of the left upper lobe back approximate 16 months prior. 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 He also recently was hospitalized with exacerbation of COPD emphysema with chronic hypoxia and respiratory failure secondary to extensive mucoid [place] plugging and possible left lower lobe pneumonia.he is seen today and doing fairly better although the hot weather is making breathing extremely difficult.this lesion was seen prior although was minor is now progressing and also involves left posterior third rib and T3 vertebral body. Patient is having some pain in the left lateral ribs most likely associated with no lesion. I am evaluating the patient for possible radiation therapy along with concurrent chemotherapy  COMPLICATIONS OF TREATMENT: none  FOLLOW UP COMPLIANCE: keeps appointments   PHYSICAL EXAM:  BP 138/74 mmHg  Pulse 102  Temp(Src) 96.3 F (35.7 C)  Resp 18  Wt 153 lb 8.8 oz (69.65 kg) Well-developed thin male in NAD not on nasal oxygen. Deep palpation of his thoracic spine does not elicit pain motor sensory and DTR levels are equal and symmetric in the upper and lower extremities. Well-developed well-nourished patient in NAD. HEENT reveals PERLA, EOMI, discs not visualized.  Oral cavity is clear. No oral mucosal lesions are identified. Neck is clear without evidence of cervical or  supraclavicular adenopathy. Lungs are clear to A&P. Cardiac examination is essentially unremarkable with regular rate and rhythm without murmur rub or thrill. Abdomen is benign with no organomegaly or masses noted. Motor sensory and DTR levels are equal and symmetric in the upper and lower extremities. Cranial nerves II through XII are grossly intact. Proprioception is intact. No peripheral adenopathy or edema is identified. No motor or sensory levels are noted. Crude visual fields are within normal range.   RADIOLOGY RESULTS: CT scans are reviewed and compatible with the above-stated findings  PLAN: I have discussed the case with medical oncology. Believe this is another non-small cell lung cancer with encroachment on the spinal canal. I would favor I am RT radiation therapy to spare critical structures such as a spinal cord and limit the amount of lung toxicity with his severe COPD emphysema. I would plan on delivering 6000 cGy over 6 weeks with concurrent chemotherapy under medical oncology's direction. Risks and benefits of treatment including lesion being close proximity to the spinal cord, fatigue skin reaction, alteration of blood counts and some destruction of normal lung all were discussed in detail with the patient. He seems to comprehend my treatment plan well. He will be seen by medical oncology today for further recommendations regarding systemic chemotherapy. I have set up and ordered CT simulation early next week.  I would like to take this opportunity for allowing me to participate in the care of your patient.Armstead Peaks., MD

## 2014-10-29 NOTE — Progress Notes (Signed)
[place] @ [facility] [phone]  Fax:([phone]) [place]: October 27, 1929  MR#: [id number]  JQG#:[id number]  Patient Care Team: Albina Billet, MD as PCP - General (Internal Medicine)  CHIEF COMPLAINT:  Chief Complaint  Patient presents with  . New Evaluation    VISIT DIAGNOSIS:     ICD-9-CM ICD-10-CM   1. Malignant neoplasm of hilus of lung, unspecified laterality 162.2 C34.00 CBC with Differential     Comprehensive metabolic panel     NM PET Image Initial (PI) Skull Base To Thigh     Oncology History   1.  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 Further staging workup with PET scan pending (diagnosis based on  CT scan ) July, 2016 2. SB RT for a T1 lesion of the left lung superior segment left upper lobe Biopsy was consistent with non-small cell carcinoma of lung in February of 2015     Carcinoma, lung   10/23/2014 Initial Diagnosis Carcinoma, lung    Lung cancer   10/24/2014 Initial Diagnosis Lung cancer    Oncology Flowsheet 10/24/2014 10/24/2014 10/24/2014 10/24/2014 10/24/2014 10/25/2014  enoxaparin (LOVENOX) [place] 40 mg         -  LORazepam (ATIVAN) PO 1 mg 1 mg 1 mg 0.5 mg 1 mg 1 mg    INTERVAL HISTORY:  81 year old gentleman was admitted in the hospital with increasing shortness of breath.  Recently was in hospital with "pneumonia".  At that time his CT scan revealed left upper lobe lesion which was eroding into the vertebral body.  Patient had a devious history of biopsy proven non-small cell carcinoma of lung which favor squamous cell carcinoma in the superior segment of the left upper lobe and patient had received stereotactic radiation therapy  Patient has constant pain in the left upper chest wall area particularly in the back.  Over last several months  Patient had been a chronic smoker in the past but quit smoking recently.  Has COPD with acute exacerbation and using oxygen at home  Appetite has been stable  REVIEW  OF SYSTEMS:    general status: Patient is feeling weak and tired.  No change in a performance status.  No chills.  No fever. HEENT: Alopecia.  No evidence of stomatitis Lungs: No cough or shortness of breath Cardiac: No chest pain or paroxysmal nocturnal dyspnea GI: No nausea no vomiting no diarrhea no abdominal pain Skin: No rash Lower extremity no swelling Neurological system: No tingling.  No numbness.  No other focal signs Musculoskeletal system : Having interscapular pain on the left side constant dull aching GU: No dysuria or hematuria  As per HPI. Otherwise, a complete review of systems is negatve.  PAST MEDICAL HISTORY: Past Medical History  Diagnosis Date  . Major depressive disorder, recurrent, severe with psychotic features   . Persistent disorder of initiating or maintaining sleep   . Generalized anxiety disorder   . Major depressive disorder, recurrent, severe with psychotic features   . Persistent disorder of initiating or maintaining sleep   . Asthma   . Cancer     Left lung  . COPD (chronic obstructive pulmonary disease)     PAST SURGICAL HISTORY: Past Surgical History  Procedure Laterality Date  . Hemorroidectomy    . Lung surgery      FAMILY HISTORY Family History  Problem Relation Age of Onset  . COPD Mother   . Lung cancer Father  ADVANCED DIRECTIVES: Patient does have advance healthcare directive, Patient   does not desire to make any changes   HEALTH MAINTENANCE: History  Substance Use Topics  . Smoking status: Former Research scientist (life sciences)  . Smokeless tobacco: Never Used     Comment: quit 18 years ago  . Alcohol Use: No    :  Allergies  Allergen Reactions  . Penicillins     Current Outpatient Prescriptions  Medication Sig Dispense Refill  . albuterol (PROVENTIL HFA;VENTOLIN HFA) 108 (90 BASE) MCG/ACT inhaler Inhale 1-2 puffs into the lungs QID.     Marland Kitchen atorvastatin (LIPITOR) 40 MG tablet Take 1 tablet by mouth daily.    Marland Kitchen azithromycin  (ZITHROMAX) 250 MG tablet 1 tab po daily for 3 days 6 each 0  . cefUROXime (CEFTIN) 500 MG tablet Take 1 tablet (500 mg total) by mouth 2 (two) times daily with a meal. 10 tablet 0  . econazole nitrate 1 % cream Apply 1 application topically 2 (two) times daily.     Marland Kitchen esomeprazole (NEXIUM) 40 MG capsule Take 40 mg by mouth daily at 12 noon.    Marland Kitchen esomeprazole (NEXIUM) 40 MG packet Take 1 packet by mouth daily.    Marland Kitchen LORazepam (ATIVAN) 1 MG tablet Take 1 tablet 3 times a day and one half a tablet once a day. 315 tablet 0  . mometasone-formoterol (DULERA) 200-5 MCG/ACT AERO Inhale 2 puffs into the lungs 2 (two) times daily.     . QUEtiapine (SEROQUEL XR) 300 MG 24 hr tablet Take 1 tablet (300 mg total) by mouth at bedtime. 90 tablet 0  . tamsulosin (FLOMAX) 0.4 MG CAPS capsule Take 1 capsule by mouth daily.    Marland Kitchen tiotropium (SPIRIVA) 18 MCG inhalation capsule Place into inhaler and inhale daily.    Marland Kitchen triamcinolone cream (KENALOG) 0.1 %     . venlafaxine XR (EFFEXOR-XR) 150 MG 24 hr capsule Take 1 capsule (150 mg total) by mouth daily. 90 capsule 0  . zolpidem (AMBIEN CR) 12.5 MG CR tablet Take 1 tablet (12.5 mg total) by mouth at bedtime as needed for sleep. 90 tablet 0  . zolpidem (AMBIEN) 10 MG tablet Take 1 tablet (10 mg total) by mouth at bedtime. 30 tablet 2   No current facility-administered medications for this visit.    OBJECTIVE: PHYSICAL EXAM: GENERAL:  Well developed, well nourished, sitting comfortably in the exam room in no acute distress.  Feeling somewhat weak and tired MENTAL STATUS:  Alert and oriented to person, place and time. HEAD: alopecia  Normocephalic, atraumatic, face symmetric, no Cushingoid features.   RESPIRATORY:  Clear to auscultation without rales, wheezes or rhonchi.  Emphysematous chest  Diminished air entry on the both sides.Marland Kitchen  CARDIOVASCULAR:  Regular rate and rhythm without murmur, rub or gallop. BREAST:  Right breast without masses, skin changes or nipple  discharge.  Left breast without masses, skin changes or nipple discharge. ABDOMEN:  Soft, non-tender, with active bowel sounds, and no hepatosplenomegaly.  No masses. BACK:  No CVA tenderness.  No tenderness on percussion of the back or rib cage. SKIN:  No rashes, ulcers or lesions. EXTREMITIES: No edema, no skin discoloration or tenderness.  No palpable cords. LYMPH NODES: No palpable cervical, supraclavicular, axillary or inguinal adenopathy  NEUROLOGICAL: Unremarkable. PSYCH:  Appropriate.  Filed Vitals:   10/29/14 1403  BP: 122/68  Pulse: 101  Temp: 96.6 F (35.9 C)     Body mass index is 24.06 kg/(m^2).    ECOG FS:1 -  Symptomatic but completely ambulatory  LAB RESULTS:  No visits with results within 2 Day(s) from this visit. Latest known visit with results is:  Admission on 10/23/2014, Discharged on 10/25/2014  Component Date Value Ref Range Status  . WBC 10/23/2014 16.2* 3.8 - 10.6 K/uL Final  . RBC 10/23/2014 4.23* 4.40 - 5.90 MIL/uL Final  . Hemoglobin 10/23/2014 12.9* 13.0 - 18.0 g/dL Final  . HCT 10/23/2014 39.3* 40.0 - 52.0 % Final  . MCV 10/23/2014 92.8  80.0 - 100.0 fL Final  . MCH 10/23/2014 30.4  26.0 - 34.0 pg Final  . MCHC 10/23/2014 32.8  32.0 - 36.0 g/dL Final  . RDW 10/23/2014 14.7* 11.5 - 14.5 % Final  . Platelets 10/23/2014 195  150 - 440 K/uL Final  . Neutrophils Relative % 10/23/2014 84   Final  . Neutro Abs 10/23/2014 13.7* 1.4 - 6.5 K/uL Final  . Lymphocytes Relative 10/23/2014 6   Final  . Lymphs Abs 10/23/2014 1.0  1.0 - 3.6 K/uL Final  . Monocytes Relative 10/23/2014 9   Final  . Monocytes Absolute 10/23/2014 1.4* 0.2 - 1.0 K/uL Final  . Eosinophils Relative 10/23/2014 1   Final  . Eosinophils Absolute 10/23/2014 0.1  0 - 0.7 K/uL Final  . Basophils Relative 10/23/2014 0   Final  . Basophils Absolute 10/23/2014 0.0  0 - 0.1 K/uL Final  . Sodium 10/23/2014 141  135 - 145 mmol/L Final  . Potassium 10/23/2014 3.5  3.5 - 5.1 mmol/L Final  .  Chloride 10/23/2014 104  101 - 111 mmol/L Final  . CO2 10/23/2014 26  22 - 32 mmol/L Final  . Glucose, Bld 10/23/2014 158* 65 - 99 mg/dL Final  . BUN 10/23/2014 19  6 - 20 mg/dL Final  . Creatinine, Ser 10/23/2014 0.76  0.61 - 1.24 mg/dL Final  . Calcium 10/23/2014 8.9  8.9 - 10.3 mg/dL Final  . Total Protein 10/23/2014 7.2  6.5 - 8.1 g/dL Final  . Albumin 10/23/2014 3.8  3.5 - 5.0 g/dL Final  . AST 10/23/2014 24  15 - 41 U/L Final  . ALT 10/23/2014 17  17 - 63 U/L Final  . Alkaline Phosphatase 10/23/2014 68  38 - 126 U/L Final  . Total Bilirubin 10/23/2014 0.4  0.3 - 1.2 mg/dL Final  . GFR calc non Af Amer 10/23/2014 >60  >60 mL/min Final  . GFR calc Af Amer 10/23/2014 >60  >60 mL/min Final   Comment: (NOTE) The eGFR has been calculated using the CKD EPI equation. This calculation has not been validated in all clinical situations. eGFR's persistently <60 mL/min signify possible Chronic Kidney Disease.   . Anion gap 10/23/2014 11  5 - 15 Final  . Troponin I 10/23/2014 <0.03  <0.031 ng/mL Final   Comment:        NO INDICATION OF MYOCARDIAL INJURY.   . B Natriuretic Peptide 10/23/2014 54.0  0.0 - 100.0 pg/mL Final  . Lactic Acid, Venous 10/23/2014 1.6  0.5 - 2.0 mmol/L Final  . Specimen Description 10/23/2014 BLOOD   Final  . Special Requests 10/23/2014 [id number]   Final  . Culture 10/23/2014 NO GROWTH 5 DAYS   Final  . Report Status 10/23/2014 10/28/2014 FINAL   Final  . Specimen Description 10/23/2014 BLOOD RESISTANT ASSIST CONTROL   Final  . Special Requests 10/23/2014 BOTTLES DRAWN AEROBIC AND ANAEROBIC  4CC   Final  . Culture 10/23/2014 NO GROWTH 5 DAYS   Final  . Report Status 10/23/2014 10/29/2014  FINAL   Final  . Creatinine, Ser 10/24/2014 0.63  0.61 - 1.24 mg/dL Final  . GFR calc non Af Amer 10/24/2014 >60  >60 mL/min Final  . GFR calc Af Amer 10/24/2014 >60  >60 mL/min Final   Comment: (NOTE) The eGFR has been calculated using the CKD EPI equation. This  calculation has not been validated in all clinical situations. eGFR's persistently <60 mL/min signify possible Chronic Kidney Disease.   . WBC 10/24/2014 13.8* 3.8 - 10.6 K/uL Final  . RBC 10/24/2014 3.84* 4.40 - 5.90 MIL/uL Final  . Hemoglobin 10/24/2014 12.0* 13.0 - 18.0 g/dL Final  . HCT 10/24/2014 35.9* 40.0 - 52.0 % Final  . MCV 10/24/2014 93.5  80.0 - 100.0 fL Final  . MCH 10/24/2014 31.3  26.0 - 34.0 pg Final  . MCHC 10/24/2014 33.5  32.0 - 36.0 g/dL Final  . RDW 10/24/2014 14.7* 11.5 - 14.5 % Final  . Platelets 10/24/2014 167  150 - 440 K/uL Final     STUDIES: Ct Chest W Contrast  10/13/2014   CLINICAL DATA:  Lung carcinoma.  Shortness of breath.  EXAM: CT CHEST WITH CONTRAST  TECHNIQUE: Multidetector CT imaging of the chest was performed during intravenous contrast administration.  CONTRAST:  63m OMNIPAQUE IOHEXOL 300 MG/ML  SOLN  COMPARISON:  Chest CT April 09, 2014; chest radiograph August 12, 2014  FINDINGS: The paraspinal mass at the level of T3-4 has increased in the interval compared to the previous study, currently measuring 3.7 x 4.1 x 3.2 cm. This lesion invades the posterior left third rib as well as the left side of the T3 vertebral body. There is also erosion and invasion of portions of the left T3 pedicle and pars interarticularis. This bony destruction indicates progression compared to the previous study. There is no intra there is underlying emphysematous change. There is scarring in the lingula as well as in the superior segment left lower lobe as well as in the medial segment of the right lower lobe. There is no parenchymal lung mass appreciable. There is been interval clearing of several areas of airspace disease since prior CT.  Thyroid appears normal. There is atherosclerotic change in aorta but no aneurysm or dissection. No pulmonary embolus is appreciable. There is no appreciable adenopathy. The pericardium is not thickened.  In the visualized upper abdomen, there  is a small calcified granuloma in the liver near the dome of the anterior segment right lobe. Small left renal cysts appear stable. Visualized upper abdominal structures appear normal. In particular, adrenals appear normal. Note that there is atherosclerotic change in the upper abdominal aorta.  No other blastic or lytic bone lesions are appreciable. There is degenerative change throughout the thoracic spine with generalized osteoporosis.  IMPRESSION: Progression of lesion arising at T3-4 on the left in the paraspinous region. There is destruction of portions of the left posterior third rib as well as portions of the T3 vertebral body and posterior elements on the left. Tumor does not extend into the spinal canal. There is localized pleural invasion in this area as well.  No parenchymal lung mass or adenopathy appreciable. There is underlying emphysema with areas of scarring. No adrenal lesions are appreciable.   Electronically Signed   By: WLowella GripIII M.D.   On: 10/13/2014 08:37   Ct Angio Chest Pe W/cm &/or Wo Cm  10/23/2014   CLINICAL DATA:  Lung cancer and shortness of breath. Evaluate for pulmonary embolism.  EXAM: CT ANGIOGRAPHY CHEST WITH  CONTRAST  TECHNIQUE: Multidetector CT imaging of the chest was performed using the standard protocol during bolus administration of intravenous contrast. Multiplanar CT image reconstructions and MIPs were obtained to evaluate the vascular anatomy.  CONTRAST:  3m OMNIPAQUE IOHEXOL 350 MG/ML SOLN  COMPARISON:  10/13/2014  FINDINGS: THORACIC INLET/BODY WALL:  No acute abnormality.  MEDIASTINUM:  Normal heart size. No pericardial effusion. Diffuse atherosclerosis, including the coronary arteries. CTA of the pulmonary arteries is limited by intermittent motion, but exam is overall diagnostic and negative for pulmonary embolism. No aortic dissection or aneurysm. No lymphadenopathy.  LUNG WINDOWS:  Stable lytic metastasis within the left T3 body and posterior elements  also involving the left third and fourth rib head and necks.  There is treatment change with scarring on the left.  Emphysema.  Extensive mucoid impaction in segmental airway is of the medial right lower lobe. No pneumonia, edema, effusion, or pneumothorax.  UPPER ABDOMEN:  No acute findings.  OSSEOUS:  Metastatic disease as noted above. There is spondyloarthropathy with bridging osteophytes throughout the mid lower thoracic spine  Review of the MIP images confirms the above findings.  IMPRESSION: 1. No evidence of pulmonary embolism. 2. Right lower lobe bronchial impaction which could be from aspiration or airway infection. 3. Stable metastasis eroding the left T3 vertebra and third, fourth ribs. 4. Emphysema.   Electronically Signed   By: JMonte FantasiaM.D.   On: 10/23/2014 22:35   Dg Chest Portable 1 View  10/23/2014   CLINICAL DATA:  Shortness of Breath.  Right-sided chest pain  EXAM: PORTABLE CHEST - 1 VIEW  COMPARISON:  Chest CT October 13, 2014; chest radiograph August 12, 2014  FINDINGS: There are scattered areas of scarring bilaterally, essentially stable. There is no appreciable edema or consolidation. The heart size is normal. Pulmonary vascularity is stable and within normal limits. No adenopathy. Soft tissue prominence in the medial left apex from the in known paraspinal mass at T3-4 on the left side is again noted but better seen on recent CT.  IMPRESSION: The known mass on the left in the T3-4 paraspinous region is present but better seen on recent CT. Areas of parenchymal lung scarring are stable. There is no demonstrable lung edema or consolidation. No change in cardiac silhouette.   Electronically Signed   By: WLowella GripIII M.D.   On: 10/23/2014 20:31    ASSESSMENT:  Patient has a previous history of carcinoma of lung T1 disease and status post stereotactic radiation therapy Now has a lesion in the paraspinous area at T3-T4 in making rib as well as spine Could be second primary with  T4 N0 M0 tumor or metastases from the previous tumor PET scan has been ordered. Plan: I discussed possibility of giving pain medication patient says Tylenol helps right now. PET scan would be reviewed. I discussed situation with Dr. CDonella Stadefor possibility of another biopsy case will be discussed in tumor conference possibility of radiation therapy along with carboplatinum and Taxol on a weekly basis is also being considered. Review of CT scan independently.  And all other x-rays had been discussed.  PET scan would be reviewed independently.  Patient expressed understanding and was in agreement with this plan. He also understands that He can call clinic at any time with any questions, concerns, or complaints.    Carcinoma, lung   Staging form: Lung, AJCC 7th Edition     Clinical: Stage IIIA (T4, N0, M0) - Signed by JForest Gleason MD on 10/29/2014  Forest Gleason, MD   10/29/2014 3:09 PM

## 2014-10-29 NOTE — Progress Notes (Signed)
Patient does have living will.  Former smoker. Patient states he is distressed regarding his diagnosis and breathing.

## 2014-10-31 ENCOUNTER — Ambulatory Visit
Admission: RE | Admit: 2014-10-31 | Discharge: 2014-10-31 | Disposition: A | Discharge status: Home / Self Care | Payer: Medicare HMO | Source: Ambulatory Visit | Attending: Oncology | Admitting: Oncology

## 2014-10-31 DIAGNOSIS — C3400 Malignant neoplasm of unspecified main bronchus: Secondary | ICD-10-CM

## 2014-10-31 DIAGNOSIS — I2510 Atherosclerotic heart disease of native coronary artery without angina pectoris: Secondary | ICD-10-CM | POA: Insufficient documentation

## 2014-10-31 LAB — GLUCOSE, CAPILLARY: Glucose-Capillary: 79 mg/dL (ref 65–99)

## 2014-10-31 MED ORDER — FLUDEOXYGLUCOSE F - 18 (FDG) INJECTION
11.9500 | Freq: Once | INTRAVENOUS | Status: AC | PRN
  Administered 2014-10-31: 11.95 via INTRAVENOUS

## 2014-11-03 ENCOUNTER — Ambulatory Visit
Admission: RE | Admit: 2014-11-03 | Discharge: 2014-11-03 | Disposition: A | Discharge status: Home / Self Care | Payer: Medicare HMO | Source: Ambulatory Visit | Attending: Radiation Oncology | Admitting: Radiation Oncology

## 2014-11-03 DIAGNOSIS — C3412 Malignant neoplasm of upper lobe, left bronchus or lung: Secondary | ICD-10-CM | POA: Diagnosis not present

## 2014-11-03 DIAGNOSIS — Z510 Encounter for antineoplastic radiation therapy: Secondary | ICD-10-CM | POA: Diagnosis present

## 2014-11-03 NOTE — Patient Instructions (Signed)
Paclitaxel injection What is this medicine? PACLITAXEL (PAK li TAX el) is a chemotherapy drug. It targets fast dividing cells, like cancer cells, and causes these cells to die. This medicine is used to treat ovarian cancer, breast cancer, and other cancers. This medicine may be used for other purposes; ask your health care provider or pharmacist if you have questions. COMMON BRAND NAME(S): Onxol, Taxol What should I tell my health care provider before I take this medicine? They need to know if you have any of these conditions: -blood disorders -irregular heartbeat -infection (especially a virus infection such as chickenpox, cold sores, or herpes) -liver disease -previous or ongoing radiation therapy -an unusual or allergic reaction to paclitaxel, alcohol, polyoxyethylated castor oil, other chemotherapy agents, other medicines, foods, dyes, or preservatives -pregnant or trying to get pregnant -breast-feeding How should I use this medicine? This drug is given as an infusion into a vein. It is administered in a hospital or clinic by a specially trained health care professional. Talk to your pediatrician regarding the use of this medicine in children. Special care may be needed. Overdosage: If you think you have taken too much of this medicine contact a poison control center or emergency room at once. NOTE: This medicine is only for you. Do not share this medicine with others. What if I miss a dose? It is important not to miss your dose. Call your doctor or health care professional if you are unable to keep an appointment. What may interact with this medicine? Do not take this medicine with any of the following medications: -disulfiram -metronidazole This medicine may also interact with the following medications: -cyclosporine -diazepam -ketoconazole -medicines to increase blood counts like filgrastim, pegfilgrastim, sargramostim -other chemotherapy drugs like cisplatin, doxorubicin,  epirubicin, etoposide, teniposide, vincristine -quinidine -testosterone -vaccines -verapamil Talk to your doctor or health care professional before taking any of these medicines: -acetaminophen -aspirin -ibuprofen -ketoprofen -naproxen This list may not describe all possible interactions. Give your health care provider a list of all the medicines, herbs, non-prescription drugs, or dietary supplements you use. Also tell them if you smoke, drink alcohol, or use illegal drugs. Some items may interact with your medicine. What should I watch for while using this medicine? Your condition will be monitored carefully while you are receiving this medicine. You will need important blood work done while you are taking this medicine. This drug may make you feel generally unwell. This is not uncommon, as chemotherapy can affect healthy cells as well as cancer cells. Report any side effects. Continue your course of treatment even though you feel ill unless your doctor tells you to stop. In some cases, you may be given additional medicines to help with side effects. Follow all directions for their use. Call your doctor or health care professional for advice if you get a fever, chills or sore throat, or other symptoms of a cold or flu. Do not treat yourself. This drug decreases your body's ability to fight infections. Try to avoid being around people who are sick. This medicine may increase your risk to bruise or bleed. Call your doctor or health care professional if you notice any unusual bleeding. Be careful brushing and flossing your teeth or using a toothpick because you may get an infection or bleed more easily. If you have any dental work done, tell your dentist you are receiving this medicine. Avoid taking products that contain aspirin, acetaminophen, ibuprofen, naproxen, or ketoprofen unless instructed by your doctor. These medicines may hide a fever.   Do not become pregnant while taking this medicine.  Women should inform their doctor if they wish to become pregnant or think they might be pregnant. There is a potential for serious side effects to an unborn child. Talk to your health care professional or pharmacist for more information. Do not breast-feed an infant while taking this medicine. Men are advised not to father a child while receiving this medicine. What side effects may I notice from receiving this medicine? Side effects that you should report to your doctor or health care professional as soon as possible: -allergic reactions like skin rash, itching or hives, swelling of the face, lips, or tongue -low blood counts - This drug may decrease the number of white blood cells, red blood cells and platelets. You may be at increased risk for infections and bleeding. -signs of infection - fever or chills, cough, sore throat, pain or difficulty passing urine -signs of decreased platelets or bleeding - bruising, pinpoint red spots on the skin, black, tarry stools, nosebleeds -signs of decreased red blood cells - unusually weak or tired, fainting spells, lightheadedness -breathing problems -chest pain -high or low blood pressure -mouth sores -nausea and vomiting -pain, swelling, redness or irritation at the injection site -pain, tingling, numbness in the hands or feet -slow or irregular heartbeat -swelling of the ankle, feet, hands Side effects that usually do not require medical attention (report to your doctor or health care professional if they continue or are bothersome): -bone pain -complete hair loss including hair on your head, underarms, pubic hair, eyebrows, and eyelashes -changes in the color of fingernails -diarrhea -loosening of the fingernails -loss of appetite -muscle or joint pain -red flush to skin -sweating This list may not describe all possible side effects. Call your doctor for medical advice about side effects. You may report side effects to FDA at  1-800-FDA-1088. Where should I keep my medicine? This drug is given in a hospital or clinic and will not be stored at home. NOTE: This sheet is a summary. It may not cover all possible information. If you have questions about this medicine, talk to your doctor, pharmacist, or health care provider.  2015, [employer]. (2012-05-28 16:41:21)  Carboplatin injection What is this medicine? CARBOPLATIN (KAR boe pla tin) is a chemotherapy drug. It targets fast dividing cells, like cancer cells, and causes these cells to die. This medicine is used to treat ovarian cancer and many other cancers. This medicine may be used for other purposes; ask your health care provider or pharmacist if you have questions. COMMON BRAND NAME(S): Paraplatin What should I tell my health care provider before I take this medicine? They need to know if you have any of these conditions: -blood disorders -hearing problems -kidney disease -recent or ongoing radiation therapy -an unusual or allergic reaction to carboplatin, cisplatin, other chemotherapy, other medicines, foods, dyes, or preservatives -pregnant or trying to get pregnant -breast-feeding How should I use this medicine? This drug is usually given as an infusion into a vein. It is administered in a hospital or clinic by a specially trained health care professional. Talk to your pediatrician regarding the use of this medicine in children. Special care may be needed. Overdosage: If you think you have taken too much of this medicine contact a poison control center or emergency room at once. NOTE: This medicine is only for you. Do not share this medicine with others. What if I miss a dose? It is important not to miss a dose. Call your   doctor or health care professional if you are unable to keep an appointment. What may interact with this medicine? -medicines for seizures -medicines to increase blood counts like filgrastim, pegfilgrastim,  sargramostim -some antibiotics like amikacin, gentamicin, neomycin, streptomycin, tobramycin -vaccines Talk to your doctor or health care professional before taking any of these medicines: -acetaminophen -aspirin -ibuprofen -ketoprofen -naproxen This list may not describe all possible interactions. Give your health care provider a list of all the medicines, herbs, non-prescription drugs, or dietary supplements you use. Also tell them if you smoke, drink alcohol, or use illegal drugs. Some items may interact with your medicine. What should I watch for while using this medicine? Your condition will be monitored carefully while you are receiving this medicine. You will need important blood work done while you are taking this medicine. This drug may make you feel generally unwell. This is not uncommon, as chemotherapy can affect healthy cells as well as cancer cells. Report any side effects. Continue your course of treatment even though you feel ill unless your doctor tells you to stop. In some cases, you may be given additional medicines to help with side effects. Follow all directions for their use. Call your doctor or health care professional for advice if you get a fever, chills or sore throat, or other symptoms of a cold or flu. Do not treat yourself. This drug decreases your body's ability to fight infections. Try to avoid being around people who are sick. This medicine may increase your risk to bruise or bleed. Call your doctor or health care professional if you notice any unusual bleeding. Be careful brushing and flossing your teeth or using a toothpick because you may get an infection or bleed more easily. If you have any dental work done, tell your dentist you are receiving this medicine. Avoid taking products that contain aspirin, acetaminophen, ibuprofen, naproxen, or ketoprofen unless instructed by your doctor. These medicines may hide a fever. Do not become pregnant while taking this  medicine. Women should inform their doctor if they wish to become pregnant or think they might be pregnant. There is a potential for serious side effects to an unborn child. Talk to your health care professional or pharmacist for more information. Do not breast-feed an infant while taking this medicine. What side effects may I notice from receiving this medicine? Side effects that you should report to your doctor or health care professional as soon as possible: -allergic reactions like skin rash, itching or hives, swelling of the face, lips, or tongue -signs of infection - fever or chills, cough, sore throat, pain or difficulty passing urine -signs of decreased platelets or bleeding - bruising, pinpoint red spots on the skin, black, tarry stools, nosebleeds -signs of decreased red blood cells - unusually weak or tired, fainting spells, lightheadedness -breathing problems -changes in hearing -changes in vision -chest pain -high blood pressure -low blood counts - This drug may decrease the number of white blood cells, red blood cells and platelets. You may be at increased risk for infections and bleeding. -nausea and vomiting -pain, swelling, redness or irritation at the injection site -pain, tingling, numbness in the hands or feet -problems with balance, talking, walking -trouble passing urine or change in the amount of urine Side effects that usually do not require medical attention (report to your doctor or health care professional if they continue or are bothersome): -hair loss -loss of appetite -metallic taste in the mouth or changes in taste This list may not describe all   possible side effects. Call your doctor for medical advice about side effects. You may report side effects to FDA at 1-800-FDA-1088. Where should I keep my medicine? This drug is given in a hospital or clinic and will not be stored at home. NOTE: This sheet is a summary. It may not cover all possible information. If you  have questions about this medicine, talk to your doctor, pharmacist, or health care provider.  2015, [employer]. (2007-07-10 14:38:05)  

## 2014-11-04 ENCOUNTER — Other Ambulatory Visit: Payer: Self-pay | Admitting: Oncology

## 2014-11-04 ENCOUNTER — Ambulatory Visit: Payer: Medicare HMO

## 2014-11-07 DIAGNOSIS — Z510 Encounter for antineoplastic radiation therapy: Secondary | ICD-10-CM | POA: Diagnosis not present

## 2014-11-10 ENCOUNTER — Inpatient Hospital Stay: Payer: Medicare HMO

## 2014-11-10 ENCOUNTER — Encounter: Payer: Self-pay | Admitting: Oncology

## 2014-11-10 ENCOUNTER — Inpatient Hospital Stay (HOSPITAL_BASED_OUTPATIENT_CLINIC_OR_DEPARTMENT_OTHER): Payer: Medicare HMO | Admitting: Oncology

## 2014-11-10 VITALS — BP 169/62 | HR 83 | Temp 96.3°F | Resp 18

## 2014-11-10 VITALS — BP 157/82 | HR 88 | Temp 95.9°F | Resp 18 | Wt 154.1 lb

## 2014-11-10 DIAGNOSIS — C3412 Malignant neoplasm of upper lobe, left bronchus or lung: Secondary | ICD-10-CM | POA: Diagnosis not present

## 2014-11-10 DIAGNOSIS — Z79899 Other long term (current) drug therapy: Secondary | ICD-10-CM | POA: Diagnosis not present

## 2014-11-10 DIAGNOSIS — Z5111 Encounter for antineoplastic chemotherapy: Secondary | ICD-10-CM | POA: Diagnosis not present

## 2014-11-10 DIAGNOSIS — C3492 Malignant neoplasm of unspecified part of left bronchus or lung: Secondary | ICD-10-CM

## 2014-11-10 DIAGNOSIS — C7951 Secondary malignant neoplasm of bone: Secondary | ICD-10-CM | POA: Diagnosis not present

## 2014-11-10 DIAGNOSIS — C3400 Malignant neoplasm of unspecified main bronchus: Secondary | ICD-10-CM

## 2014-11-10 DIAGNOSIS — Z510 Encounter for antineoplastic radiation therapy: Secondary | ICD-10-CM | POA: Diagnosis not present

## 2014-11-10 LAB — COMPREHENSIVE METABOLIC PANEL
ALBUMIN: 3.9 g/dL (ref 3.5–5.0)
ALT: 17 U/L (ref 17–63)
ANION GAP: 5 (ref 5–15)
AST: 19 U/L (ref 15–41)
Alkaline Phosphatase: 71 U/L (ref 38–126)
BUN: 21 mg/dL — ABNORMAL HIGH (ref 6–20)
CHLORIDE: 102 mmol/L (ref 101–111)
CO2: 30 mmol/L (ref 22–32)
Calcium: 8.8 mg/dL — ABNORMAL LOW (ref 8.9–10.3)
Creatinine, Ser: 1.02 mg/dL (ref 0.61–1.24)
GFR calc Af Amer: >60 mL/min (ref >60)
Glucose, Bld: 95 mg/dL (ref 65–99)
POTASSIUM: 4.5 mmol/L (ref 3.5–5.1)
SODIUM: 137 mmol/L (ref 135–145)
TOTAL PROTEIN: 7.4 g/dL (ref 6.5–8.1)
Total Bilirubin: 0.5 mg/dL (ref 0.3–1.2)

## 2014-11-10 LAB — CBC WITH DIFFERENTIAL/PLATELET
BASOS ABS: 0.1 10*3/uL (ref 0–0.1)
Basophils Relative: 1 %
EOS ABS: 0.3 10*3/uL (ref 0–0.7)
Eosinophils Relative: 4 %
HEMATOCRIT: 39.3 % — AB (ref 40.0–52.0)
Hemoglobin: 12.9 g/dL — ABNORMAL LOW (ref 13.0–18.0)
LYMPHS PCT: 24 %
Lymphs Abs: 1.8 10*3/uL (ref 1.0–3.6)
MCH: 31 pg (ref 26.0–34.0)
MCHC: 32.9 g/dL (ref 32.0–36.0)
MCV: 94.1 fL (ref 80.0–100.0)
MONOS PCT: 12 %
Monocytes Absolute: 0.9 10*3/uL (ref 0.2–1.0)
NEUTROS PCT: 59 %
Neutro Abs: 4.4 10*3/uL (ref 1.4–6.5)
Platelets: 203 10*3/uL (ref 150–440)
RBC: 4.18 MIL/uL — ABNORMAL LOW (ref 4.40–5.90)
RDW: 14.9 % — AB (ref 11.5–14.5)
WBC: 7.4 10*3/uL (ref 3.8–10.6)

## 2014-11-10 MED ORDER — DIPHENHYDRAMINE HCL 50 MG/ML IJ SOLN
25.0000 mg | Freq: Once | INTRAMUSCULAR | Status: AC
  Administered 2014-11-10: 25 mg via INTRAVENOUS

## 2014-11-10 MED ORDER — ONDANSETRON HCL 8 MG PO TABS: 8.0000 mg | ORAL_TABLET | Freq: Two times a day (BID) | ORAL | Status: DC

## 2014-11-10 MED ORDER — DIPHENHYDRAMINE HCL 50 MG/ML IJ SOLN
50.0000 mg | Freq: Once | INTRAMUSCULAR | Status: DC
  Filled 2014-11-10: qty 1

## 2014-11-10 MED ORDER — PACLITAXEL CHEMO INJECTION 300 MG/50ML
45.0000 mg/m2 | Freq: Once | INTRAVENOUS | Status: AC
  Administered 2014-11-10: 84 mg via INTRAVENOUS
  Filled 2014-11-10: qty 14

## 2014-11-10 MED ORDER — SODIUM CHLORIDE 0.9 % IV SOLN
160.0000 mg | Freq: Once | INTRAVENOUS | Status: AC
  Administered 2014-11-10: 160 mg via INTRAVENOUS
  Filled 2014-11-10: qty 16

## 2014-11-10 MED ORDER — SODIUM CHLORIDE 0.9 % IV SOLN
Freq: Once | INTRAVENOUS | Status: AC
  Administered 2014-11-10: 10:00:00 via INTRAVENOUS
  Filled 2014-11-10: qty 8

## 2014-11-10 MED ORDER — SODIUM CHLORIDE 0.9 % IV SOLN
Freq: Once | INTRAVENOUS | Status: AC
  Administered 2014-11-10: 09:00:00 via INTRAVENOUS
  Filled 2014-11-10: qty 1000

## 2014-11-10 MED ORDER — PROCHLORPERAZINE MALEATE 10 MG PO TABS: 10.0000 mg | ORAL_TABLET | Freq: Four times a day (QID) | ORAL | Status: DC | PRN

## 2014-11-10 MED ORDER — FAMOTIDINE IN NACL 20-0.9 MG/50ML-% IV SOLN
20.0000 mg | Freq: Once | INTRAVENOUS | Status: AC
  Administered 2014-11-10: 20 mg via INTRAVENOUS
  Filled 2014-11-10: qty 50

## 2014-11-10 NOTE — Progress Notes (Signed)
[place] @ [facility] [phone]  Fax:([phone]) [place]: 10/11/1929  MR#: [id number]  ZHG#:[id number]  Patient Care Team: Albina Billet, MD as PCP - General (Internal Medicine)  CHIEF COMPLAINT:  Chief Complaint  Patient presents with  . Follow-up    Oncology History   1.  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 Further staging workup with PET scan pending (diagnosis based on  CT scan ) July, 2016 2. SB RT for a T1 lesion of the left lung superior segment left upper lobe Biopsy was consistent with non-small cell carcinoma of lung in February of 2015 3.  Started radiation and chemotherapy for left upper lobe recurrent disease versus second primary from November 10, 2014     Carcinoma, lung   10/23/2014 Initial Diagnosis Carcinoma, lung    Lung cancer   10/24/2014 Initial Diagnosis Lung cancer    Oncology Flowsheet 10/24/2014 10/24/2014 10/24/2014 10/24/2014 10/24/2014 10/25/2014  enoxaparin (LOVENOX) [place] 40 mg         -  LORazepam (ATIVAN) PO 1 mg 1 mg 1 mg 0.5 mg 1 mg 1 mg    INTERVAL HISTORY: 81 year old gentleman was started on chemotherapy with platinum and Taxol starting from July of 2016.  Also starting radiation from 27th of July.  No nausea no vomiting no diarrhea appetite has been fairly good.  No cough or shortness of breath or chest pain in the left upper chest wall area  REVIEW OF SYSTEMS:   GENERAL:  Feels good.  Active.  No fevers, sweats or weight loss. PERFORMANCE STATUS (ECOG):01 HEENT:  No visual changes, runny nose, sore throat, mouth sores or tenderness. Lungs: No shortness of breath or cough.  No hemoptysis. Cardiac:  No chest pain, palpitations, orthopnea, or PND. GI:  No nausea, vomiting, diarrhea, constipation, melena or hematochezia. GU:  No urgency, frequency, dysuria, or hematuria. Musculoskeletal:  No back pain.  No joint pain.  No muscle tenderness. Extremities:  No pain or swelling. Skin:  No rashes or skin  changes. Neuro:  No headache, numbness or weakness, balance or coordination issues. Endocrine:  No diabetes, thyroid issues, hot flashes or night sweats. Psych:  No mood changes, depression or anxiety. Pain:  No focal pain. Review of systems:  All other systems reviewed and found to be negative. As per HPI. Otherwise, a complete review of systems is negatve.  PAST MEDICAL HISTORY: Past Medical History  Diagnosis Date  . Major depressive disorder, recurrent, severe with psychotic features   . Persistent disorder of initiating or maintaining sleep   . Generalized anxiety disorder   . Major depressive disorder, recurrent, severe with psychotic features   . Persistent disorder of initiating or maintaining sleep   . Asthma   . Cancer     Left lung  . COPD (chronic obstructive pulmonary disease)     PAST SURGICAL HISTORY: Past Surgical History  Procedure Laterality Date  . Hemorroidectomy    . Lung surgery      FAMILY HISTORY Family History  Problem Relation Age of Onset  . COPD Mother   . Lung cancer Father     ADVANCED DIRECTIVES:  No flowsheet data found.  HEALTH MAINTENANCE: History  Substance Use Topics  . Smoking status: Former Research scientist (life sciences)  . Smokeless tobacco: Never Used     Comment: quit 18 years ago  . Alcohol Use: No      Allergies  Allergen Reactions  . Penicillins  Current Outpatient Prescriptions  Medication Sig Dispense Refill  . albuterol (PROVENTIL HFA;VENTOLIN HFA) 108 (90 BASE) MCG/ACT inhaler Inhale 1-2 puffs into the lungs QID.     Marland Kitchen atorvastatin (LIPITOR) 40 MG tablet Take 1 tablet by mouth daily.    Marland Kitchen azithromycin (ZITHROMAX) 250 MG tablet 1 tab po daily for 3 days 6 each 0  . cefUROXime (CEFTIN) 500 MG tablet Take 1 tablet (500 mg total) by mouth 2 (two) times daily with a meal. 10 tablet 0  . econazole nitrate 1 % cream Apply 1 application topically 2 (two) times daily.     Marland Kitchen esomeprazole (NEXIUM) 40 MG capsule Take 40 mg by mouth daily at  12 noon.    Marland Kitchen esomeprazole (NEXIUM) 40 MG packet Take 1 packet by mouth daily.    Marland Kitchen LORazepam (ATIVAN) 1 MG tablet Take 1 tablet 3 times a day and one half a tablet once a day. 315 tablet 0  . mometasone-formoterol (DULERA) 200-5 MCG/ACT AERO Inhale 2 puffs into the lungs 2 (two) times daily.     . QUEtiapine (SEROQUEL XR) 300 MG 24 hr tablet Take 1 tablet (300 mg total) by mouth at bedtime. 90 tablet 0  . tamsulosin (FLOMAX) 0.4 MG CAPS capsule Take 1 capsule by mouth daily.    Marland Kitchen tiotropium (SPIRIVA) 18 MCG inhalation capsule Place into inhaler and inhale daily.    Marland Kitchen triamcinolone cream (KENALOG) 0.1 %     . venlafaxine XR (EFFEXOR-XR) 150 MG 24 hr capsule Take 1 capsule (150 mg total) by mouth daily. 90 capsule 0  . zolpidem (AMBIEN CR) 12.5 MG CR tablet Take 1 tablet (12.5 mg total) by mouth at bedtime as needed for sleep. 90 tablet 0  . zolpidem (AMBIEN) 10 MG tablet Take 1 tablet (10 mg total) by mouth at bedtime. 30 tablet 2  . ondansetron (ZOFRAN) 8 MG tablet Take 1 tablet (8 mg total) by mouth 2 (two) times daily. Start the day after chemo for 3 days. Then take as needed for nausea or vomiting. 30 tablet 1  . prochlorperazine (COMPAZINE) 10 MG tablet Take 1 tablet (10 mg total) by mouth every 6 (six) hours as needed (Nausea or vomiting). 30 tablet 1   No current facility-administered medications for this visit.    OBJECTIVE:  Filed Vitals:   11/10/14 0841  BP: 157/82  Pulse: 88  Temp: 95.9 F (35.5 C)  Resp: 18     Body mass index is 24.13 kg/(m^2).    ECOG FS:1 - Symptomatic but completely ambulatory  PHYSICAL EXAM: GENERAL:  Well developed, well nourished, sitting comfortably in the exam room in no acute distress. MENTAL STATUS:  Alert and oriented to person, place and time. HEAD: Alopecia  Normocephalic, atraumatic, face symmetric, no Cushingoid features. EYES:  Pupils equal round and reactive to light and accomodation.  No conjunctivitis or scleral icterus. ENT:   Oropharynx clear without lesion.  Tongue normal. Mucous membranes moist.  RESPIRATORY:  Clear to auscultation without rales, wheezes or rhonchi. CARDIOVASCULAR:  Regular rate and rhythm without murmur, rub or gallop. BREAST:  Right breast without masses, skin changes or nipple discharge.  Left breast without masses, skin changes or nipple discharge. ABDOMEN:  Soft, non-tender, with active bowel sounds, and no hepatosplenomegaly.  No masses. BACK:  No CVA tenderness.  No tenderness on percussion of the back or rib cage. SKIN:  No rashes, ulcers or lesions. EXTREMITIES: No edema, no skin discoloration or tenderness.  No palpable cords. LYMPH NODES:  No palpable cervical, supraclavicular, axillary or inguinal adenopathy  NEUROLOGICAL: Unremarkable. PSYCH:  Appropriate.   LAB RESULTS:  Appointment on 11/10/2014  Component Date Value Ref Range Status  . WBC 11/10/2014 7.4  3.8 - 10.6 K/uL Final  . RBC 11/10/2014 4.18* 4.40 - 5.90 MIL/uL Final  . Hemoglobin 11/10/2014 12.9* 13.0 - 18.0 g/dL Final  . HCT 11/10/2014 39.3* 40.0 - 52.0 % Final  . MCV 11/10/2014 94.1  80.0 - 100.0 fL Final  . MCH 11/10/2014 31.0  26.0 - 34.0 pg Final  . MCHC 11/10/2014 32.9  32.0 - 36.0 g/dL Final  . RDW 11/10/2014 14.9* 11.5 - 14.5 % Final  . Platelets 11/10/2014 203  150 - 440 K/uL Final  . Neutrophils Relative % 11/10/2014 59   Final  . Neutro Abs 11/10/2014 4.4  1.4 - 6.5 K/uL Final  . Lymphocytes Relative 11/10/2014 24   Final  . Lymphs Abs 11/10/2014 1.8  1.0 - 3.6 K/uL Final  . Monocytes Relative 11/10/2014 12   Final  . Monocytes Absolute 11/10/2014 0.9  0.2 - 1.0 K/uL Final  . Eosinophils Relative 11/10/2014 4   Final  . Eosinophils Absolute 11/10/2014 0.3  0 - 0.7 K/uL Final  . Basophils Relative 11/10/2014 1   Final  . Basophils Absolute 11/10/2014 0.1  0 - 0.1 K/uL Final  . Sodium 11/10/2014 137  135 - 145 mmol/L Final  . Potassium 11/10/2014 4.5  3.5 - 5.1 mmol/L Final  . Chloride 11/10/2014  102  101 - 111 mmol/L Final  . CO2 11/10/2014 30  22 - 32 mmol/L Final  . Glucose, Bld 11/10/2014 95  65 - 99 mg/dL Final  . BUN 11/10/2014 21* 6 - 20 mg/dL Final  . Creatinine, Ser 11/10/2014 1.02  0.61 - 1.24 mg/dL Final  . Calcium 11/10/2014 8.8* 8.9 - 10.3 mg/dL Final  . Total Protein 11/10/2014 7.4  6.5 - 8.1 g/dL Final  . Albumin 11/10/2014 3.9  3.5 - 5.0 g/dL Final  . AST 11/10/2014 19  15 - 41 U/L Final  . ALT 11/10/2014 17  17 - 63 U/L Final  . Alkaline Phosphatase 11/10/2014 71  38 - 126 U/L Final  . Total Bilirubin 11/10/2014 0.5  0.3 - 1.2 mg/dL Final  . GFR calc non Af Amer 11/10/2014 >60  >60 mL/min Final  . GFR calc Af Amer 11/10/2014 >60  >60 mL/min Final   Comment: (NOTE) The eGFR has been calculated using the CKD EPI equation. This calculation has not been validated in all clinical situations. eGFR's persistently <60 mL/min signify possible Chronic Kidney Disease.   . Anion gap 11/10/2014 5  5 - 15 Final      STUDIES: Ct Chest W Contrast  10/13/2014   CLINICAL DATA:  Lung carcinoma.  Shortness of breath.  EXAM: CT CHEST WITH CONTRAST  TECHNIQUE: Multidetector CT imaging of the chest was performed during intravenous contrast administration.  CONTRAST:  63mL OMNIPAQUE IOHEXOL 300 MG/ML  SOLN  COMPARISON:  Chest CT April 09, 2014; chest radiograph August 12, 2014  FINDINGS: The paraspinal mass at the level of T3-4 has increased in the interval compared to the previous study, currently measuring 3.7 x 4.1 x 3.2 cm. This lesion invades the posterior left third rib as well as the left side of the T3 vertebral body. There is also erosion and invasion of portions of the left T3 pedicle and pars interarticularis. This bony destruction indicates progression compared to the previous study. There is no  intra there is underlying emphysematous change. There is scarring in the lingula as well as in the superior segment left lower lobe as well as in the medial segment of the right  lower lobe. There is no parenchymal lung mass appreciable. There is been interval clearing of several areas of airspace disease since prior CT.  Thyroid appears normal. There is atherosclerotic change in aorta but no aneurysm or dissection. No pulmonary embolus is appreciable. There is no appreciable adenopathy. The pericardium is not thickened.  In the visualized upper abdomen, there is a small calcified granuloma in the liver near the dome of the anterior segment right lobe. Small left renal cysts appear stable. Visualized upper abdominal structures appear normal. In particular, adrenals appear normal. Note that there is atherosclerotic change in the upper abdominal aorta.  No other blastic or lytic bone lesions are appreciable. There is degenerative change throughout the thoracic spine with generalized osteoporosis.  IMPRESSION: Progression of lesion arising at T3-4 on the left in the paraspinous region. There is destruction of portions of the left posterior third rib as well as portions of the T3 vertebral body and posterior elements on the left. Tumor does not extend into the spinal canal. There is localized pleural invasion in this area as well.  No parenchymal lung mass or adenopathy appreciable. There is underlying emphysema with areas of scarring. No adrenal lesions are appreciable.   Electronically Signed   By: Lowella Grip III M.D.   On: 10/13/2014 08:37   Ct Angio Chest Pe W/cm &/or Wo Cm  10/23/2014   CLINICAL DATA:  Lung cancer and shortness of breath. Evaluate for pulmonary embolism.  EXAM: CT ANGIOGRAPHY CHEST WITH CONTRAST  TECHNIQUE: Multidetector CT imaging of the chest was performed using the standard protocol during bolus administration of intravenous contrast. Multiplanar CT image reconstructions and MIPs were obtained to evaluate the vascular anatomy.  CONTRAST:  74mL OMNIPAQUE IOHEXOL 350 MG/ML SOLN  COMPARISON:  10/13/2014  FINDINGS: THORACIC INLET/BODY WALL:  No acute abnormality.   MEDIASTINUM:  Normal heart size. No pericardial effusion. Diffuse atherosclerosis, including the coronary arteries. CTA of the pulmonary arteries is limited by intermittent motion, but exam is overall diagnostic and negative for pulmonary embolism. No aortic dissection or aneurysm. No lymphadenopathy.  LUNG WINDOWS:  Stable lytic metastasis within the left T3 body and posterior elements also involving the left third and fourth rib head and necks.  There is treatment change with scarring on the left.  Emphysema.  Extensive mucoid impaction in segmental airway is of the medial right lower lobe. No pneumonia, edema, effusion, or pneumothorax.  UPPER ABDOMEN:  No acute findings.  OSSEOUS:  Metastatic disease as noted above. There is spondyloarthropathy with bridging osteophytes throughout the mid lower thoracic spine  Review of the MIP images confirms the above findings.  IMPRESSION: 1. No evidence of pulmonary embolism. 2. Right lower lobe bronchial impaction which could be from aspiration or airway infection. 3. Stable metastasis eroding the left T3 vertebra and third, fourth ribs. 4. Emphysema.   Electronically Signed   By: Monte Fantasia M.D.   On: 10/23/2014 22:35   Nm Pet Image Initial (pi) Skull Base To Thigh  10/31/2014   CLINICAL DATA:  Subsequent treatment strategy for lung cancer.  EXAM: NUCLEAR MEDICINE PET SKULL BASE TO THIGH  TECHNIQUE: 12.0 mCi F-18 FDG was injected intravenously. Full-ring PET imaging was performed from the skull base to thigh after the radiotracer. CT data was obtained and used for attenuation correction  and anatomic localization.  FASTING BLOOD GLUCOSE:  Value: 79 mg/dl  COMPARISON:  CT chest 10/13/2014 and PET 05/02/2013.  FINDINGS: NECK  No hypermetabolic lymph nodes in the neck. CT images show no acute findings.  CHEST  No hypermetabolic mediastinal, hilar or axillary lymph nodes. No hypermetabolic pulmonary nodules. CT images show atherosclerotic calcification of the arterial  vasculature, including coronary arteries. No pericardial or pleural effusion. Postoperative changes and scarring in the left hemi thorax.  ABDOMEN/PELVIS  No abnormal hypermetabolism in the liver, adrenal glands, spleen or pancreas. No hypermetabolic lymph nodes. CT images show the liver, gallbladder and adrenal glands to be grossly unremarkable. 13 mm low-attenuation lesion in the right kidney is likely a cyst although definitive characterization is difficult due to size and lack of postcontrast imaging. Left kidney, spleen, pancreas, stomach and bowel are grossly unremarkable. No free fluid.  SKELETON  A hypermetabolic 2.8 x 3.8 cm destructive soft tissue mass is seen in the medial aspect of the left third rib, with invasion of the adjacent T3 vertebral body and an SUV max of 49.6. No additional abnormal osseous hypermetabolism. Degenerative changes are seen throughout the spine.  IMPRESSION: 1. Intensely hypermetabolic destructive soft tissue mass centered in the medial left third rib, with involvement of the adjacent T3 vertebral body. 2. No additional areas of abnormal hypermetabolism in the neck, chest, abdomen or pelvis. 3. Coronary artery calcification.   Electronically Signed   By: Lorin Picket M.D.   On: 10/31/2014 09:54   Dg Chest Portable 1 View  10/23/2014   CLINICAL DATA:  Shortness of Breath.  Right-sided chest pain  EXAM: PORTABLE CHEST - 1 VIEW  COMPARISON:  Chest CT October 13, 2014; chest radiograph August 12, 2014  FINDINGS: There are scattered areas of scarring bilaterally, essentially stable. There is no appreciable edema or consolidation. The heart size is normal. Pulmonary vascularity is stable and within normal limits. No adenopathy. Soft tissue prominence in the medial left apex from the in known paraspinal mass at T3-4 on the left side is again noted but better seen on recent CT.  IMPRESSION: The known mass on the left in the T3-4 paraspinous region is present but better seen on recent  CT. Areas of parenchymal lung scarring are stable. There is no demonstrable lung edema or consolidation. No change in cardiac silhouette.   Electronically Signed   By: Lowella Grip III M.D.   On: 10/23/2014 20:31    ASSESSMENT: Recurrent or another primary with left upper lobe invading vertebral body. Patient is starting chemotherapy radiation therapy Informed consent was obtained All the side effects of chemotherapy including myelosuppression, alopecia, nausea vomiting fatigue weakness.  Secondary infection, and   peripheral neuropathy .  Has been discussed in details. Informal consent has been obtained and will be documented by nurses in the chart MEDICAL DECISION MAKING:  Carboplatinum Taxol weekly with radiation therapy Patient and number of questions which were answered. Total duration of visit was 45 minutes.  50% or more time was spent in counseling patient and family regarding prognosis and options of treatment and available resources Pain under better control with Tylenol and Advil  Patient expressed understanding and was in agreement with this plan. He also understands that He can call clinic at any time with any questions, concerns, or complaints.    Carcinoma, lung   Staging form: Lung, AJCC 7th Edition     Clinical: Stage IIIA (T4, N0, M0) - Signed by Forest Gleason, MD on 10/29/2014  Forest Gleason, MD   11/10/2014 8:56 AM

## 2014-11-10 NOTE — Progress Notes (Signed)
Patient does have living will.  Former smoker.

## 2014-11-12 ENCOUNTER — Ambulatory Visit
Admission: RE | Admit: 2014-11-12 | Discharge: 2014-11-12 | Disposition: A | Discharge status: Home / Self Care | Payer: Medicare HMO | Source: Ambulatory Visit | Attending: Radiation Oncology | Admitting: Radiation Oncology

## 2014-11-12 ENCOUNTER — Encounter: Payer: Self-pay | Admitting: Psychiatry

## 2014-11-12 ENCOUNTER — Ambulatory Visit (INDEPENDENT_AMBULATORY_CARE_PROVIDER_SITE_OTHER): Payer: Medicare HMO | Admitting: Psychiatry

## 2014-11-12 VITALS — BP 102/88 | Temp 97.2°F | Ht 67.5 in | Wt 155.2 lb

## 2014-11-12 DIAGNOSIS — G4700 Insomnia, unspecified: Secondary | ICD-10-CM

## 2014-11-12 DIAGNOSIS — Z510 Encounter for antineoplastic radiation therapy: Secondary | ICD-10-CM | POA: Diagnosis not present

## 2014-11-12 DIAGNOSIS — F411 Generalized anxiety disorder: Secondary | ICD-10-CM

## 2014-11-12 DIAGNOSIS — F333 Major depressive disorder, recurrent, severe with psychotic symptoms: Secondary | ICD-10-CM

## 2014-11-12 NOTE — Progress Notes (Signed)
[facility] MD/PA/NP OP Progress Note  11/12/2014 8:51 AM Timothy Ali  MRN:  [id number]  Subjective:  Today patient's main issue is discussion of his treatment for cancer. He states that he is scheduled for 6 chemotherapy treatments that will be once a week for 6 weeks and he is going to have 30 radiation treatments. He states he status post report today for radiation treatment. He states he does have some social supports as he has one sister in [place] that he can rely on for help should he need it. He states his medications continue to work well but sleep continues to be an issue. He was aware that the insurance turned down his Ambien CR. We did discuss other alternatives such as melatonin or Rozerem. However patient states he prefers to continue with Ambien. He states the lorazepam is helping well for anxiety. I did discuss with him the use of Ambien as he stated he occasionally will take 1-1/2 of the Ambien 10 mg tablets. I explained him this was above the manufactured recommended dose and that he should not do this.  I did discuss with him how he feels about people in his apartment. He states that he is doing okay with that. He did ask if I contacted the police and I told him I did but that they had no record of any incidence. Patient stated that's probably true but still insisted that the chief would know about him. I did discuss with him how he felt about the police and he stated he felt like they were supportive and a ally to him he commented they have done well in the situation. Chief Complaint: "chemotherapy and radiation" Chief Complaint    Follow-up; Medication Refill     Visit Diagnosis:  No diagnosis found.  Past Medical History:  Past Medical History  Diagnosis Date  . Major depressive disorder, recurrent, severe with psychotic features   . Persistent disorder of initiating or maintaining sleep   . Generalized anxiety disorder   . Major depressive disorder, recurrent, severe with psychotic  features   . Persistent disorder of initiating or maintaining sleep   . Asthma   . Cancer     Left lung  . COPD (chronic obstructive pulmonary disease)     Past Surgical History  Procedure Laterality Date  . Hemorroidectomy    . Lung surgery     Family History:  Family History  Problem Relation Age of Onset  . COPD Mother   . Lung cancer Father    Social History:  History   Social History  . Marital Status: Widowed    Spouse Name: N/A  . Number of Children: N/A  . Years of Education: N/A   Social History Main Topics  . Smoking status: Former Research scientist (life sciences)  . Smokeless tobacco: Never Used     Comment: quit 18 years ago  . Alcohol Use: No  . Drug Use: No  . Sexual Activity: No   Other Topics Concern  . None   Social History Narrative   Additional History:   Assessment:   Musculoskeletal: Strength & Muscle Tone: within normal limits Gait & Station: normal Patient leans: N/A  Psychiatric Specialty Exam: HPI  Review of Systems  Psychiatric/Behavioral: Negative for depression, suicidal ideas, hallucinations, memory loss and substance abuse. The patient has insomnia. The patient is not nervous/anxious.     Blood pressure 102/88, temperature 97.2 F (36.2 C), temperature source Tympanic, height 5' 7.5" (1.715 m), weight 155 lb 3.2 oz (  70.398 kg).Body mass index is 23.93 kg/(m^2).  General Appearance: Neat and Well Groomed  Eye Contact:  Good  Speech:  Clear and Coherent and Normal Rate  Volume:  Normal  Mood:  "Devastating" regarding upcoming cancer treatment  Affect:  Constricted  Thought Process:  Linear and Logical  Orientation:  Full (Time, Place, and Person)  Thought Content:  Not verbalizing paranoid ideation as in past visit  Suicidal Thoughts:  No  Homicidal Thoughts:  No  Memory:  Immediate;   Good Recent;   Good Remote;   Good  Judgement:  Good  Insight:  Fair  Psychomotor Activity:  Negative  Concentration:  Good  Recall:  Good  Fund of  Knowledge: Good  Language: Good  Akathisia:  Negative  Handed:  Right unknown  AIMS (if indicated):  Not done  Assets:  Desire for Improvement  ADL's:  Intact  Cognition: WNL  Sleep:  5 hour per night   Is the patient at risk to self?  No. Has the patient been a risk to self in the past 6 months?  No. Has the patient been a risk to self within the distant past?  No. Is the patient a risk to others?  No. Has the patient been a risk to others in the past 6 months?  No. Has the patient been a risk to others within the distant past?  No.  Current Medications: Current Outpatient Prescriptions  Medication Sig Dispense Refill  . albuterol (PROVENTIL HFA;VENTOLIN HFA) 108 (90 BASE) MCG/ACT inhaler Inhale 1-2 puffs into the lungs QID.     Marland Kitchen atorvastatin (LIPITOR) 40 MG tablet Take 1 tablet by mouth daily.    Marland Kitchen azithromycin (ZITHROMAX) 250 MG tablet 1 tab po daily for 3 days 6 each 0  . cefUROXime (CEFTIN) 500 MG tablet Take 1 tablet (500 mg total) by mouth 2 (two) times daily with a meal. 10 tablet 0  . econazole nitrate 1 % cream Apply 1 application topically 2 (two) times daily.     Marland Kitchen esomeprazole (NEXIUM) 40 MG capsule Take 40 mg by mouth daily at 12 noon.    Marland Kitchen esomeprazole (NEXIUM) 40 MG packet Take 1 packet by mouth daily.    Marland Kitchen LORazepam (ATIVAN) 1 MG tablet Take 1 tablet 3 times a day and one half a tablet once a day. 315 tablet 0  . mometasone-formoterol (DULERA) 200-5 MCG/ACT AERO Inhale 2 puffs into the lungs 2 (two) times daily.     . ondansetron (ZOFRAN) 8 MG tablet Take 1 tablet (8 mg total) by mouth 2 (two) times daily. Start the day after chemo for 3 days. Then take as needed for nausea or vomiting. 30 tablet 1  . prochlorperazine (COMPAZINE) 10 MG tablet Take 1 tablet (10 mg total) by mouth every 6 (six) hours as needed (Nausea or vomiting). 30 tablet 1  . QUEtiapine (SEROQUEL XR) 300 MG 24 hr tablet Take 1 tablet (300 mg total) by mouth at bedtime. 90 tablet 0  . tamsulosin  (FLOMAX) 0.4 MG CAPS capsule Take 1 capsule by mouth daily.    Marland Kitchen tiotropium (SPIRIVA) 18 MCG inhalation capsule Place into inhaler and inhale daily.    Marland Kitchen triamcinolone cream (KENALOG) 0.1 %     . venlafaxine XR (EFFEXOR-XR) 150 MG 24 hr capsule Take 1 capsule (150 mg total) by mouth daily. 90 capsule 0  . zolpidem (AMBIEN) 10 MG tablet Take 1 tablet (10 mg total) by mouth at bedtime. 30 tablet 2  No current facility-administered medications for this visit.    Medical Decision Making:  Established Problem, Stable/Improving (1), Review of Medication Regimen & Side Effects (2) and Review of New Medication or Change in Dosage (2)  Treatment Plan Summary:Medication management and Plan Continue lorazepam 1 mg 3 times a day and a half a milligram once a day. Ideally we will continue to try to decrease the dose of this medication, however given that he is undergoing cancer treatments we will pursue this after completion of his cancer treatments. We'll continue him on his Effexor XR at 150 mg daily, we'll continue the Seroquel XR 300 mg at bedtime. Continue his Ambien 10 mg at bedtime as needed. Patient stated he is still waiting for his complete schedule for his cancer treatments (radiation and chemotherapy) and thus he will call the clinic to make his next point. I did discuss that he should probably come back in the next 2 months and he thought that this was reasonable and indicated he would call the clinic to make an appointment.   He was given 90 day supply of his medications: Lorazepam Seroquel and Effexor. On July 11 he was given a 30 day supply of regular release Ambien with 2 refills. At this time he is not need any medications.  Faith Rogue 11/12/2014, 8:51 AM

## 2014-11-13 ENCOUNTER — Ambulatory Visit
Admission: RE | Admit: 2014-11-13 | Discharge: 2014-11-13 | Disposition: A | Discharge status: Home / Self Care | Payer: Medicare HMO | Source: Ambulatory Visit | Attending: Radiation Oncology | Admitting: Radiation Oncology

## 2014-11-13 DIAGNOSIS — Z510 Encounter for antineoplastic radiation therapy: Secondary | ICD-10-CM | POA: Diagnosis not present

## 2014-11-14 ENCOUNTER — Ambulatory Visit
Admission: RE | Admit: 2014-11-14 | Discharge: 2014-11-14 | Disposition: A | Discharge status: Home / Self Care | Payer: Medicare HMO | Source: Ambulatory Visit | Attending: Radiation Oncology | Admitting: Radiation Oncology

## 2014-11-14 DIAGNOSIS — Z510 Encounter for antineoplastic radiation therapy: Secondary | ICD-10-CM | POA: Diagnosis not present

## 2014-11-17 ENCOUNTER — Ambulatory Visit
Admission: RE | Admit: 2014-11-17 | Discharge: 2014-11-17 | Disposition: A | Discharge status: Home / Self Care | Payer: Medicare HMO | Source: Ambulatory Visit | Attending: Radiation Oncology | Admitting: Radiation Oncology

## 2014-11-17 ENCOUNTER — Encounter: Payer: Self-pay | Admitting: Oncology

## 2014-11-17 ENCOUNTER — Inpatient Hospital Stay: Payer: Medicare HMO

## 2014-11-17 ENCOUNTER — Inpatient Hospital Stay (HOSPITAL_BASED_OUTPATIENT_CLINIC_OR_DEPARTMENT_OTHER): Payer: Medicare HMO | Admitting: Oncology

## 2014-11-17 ENCOUNTER — Inpatient Hospital Stay: Payer: Medicare HMO | Attending: Oncology

## 2014-11-17 VITALS — BP 148/51 | HR 101 | Temp 95.6°F | Wt 154.3 lb

## 2014-11-17 DIAGNOSIS — J449 Chronic obstructive pulmonary disease, unspecified: Secondary | ICD-10-CM | POA: Diagnosis not present

## 2014-11-17 DIAGNOSIS — C3412 Malignant neoplasm of upper lobe, left bronchus or lung: Secondary | ICD-10-CM | POA: Insufficient documentation

## 2014-11-17 DIAGNOSIS — B371 Pulmonary candidiasis: Secondary | ICD-10-CM

## 2014-11-17 DIAGNOSIS — Z510 Encounter for antineoplastic radiation therapy: Secondary | ICD-10-CM | POA: Diagnosis not present

## 2014-11-17 DIAGNOSIS — Z5111 Encounter for antineoplastic chemotherapy: Secondary | ICD-10-CM | POA: Insufficient documentation

## 2014-11-17 DIAGNOSIS — Z79899 Other long term (current) drug therapy: Secondary | ICD-10-CM

## 2014-11-17 DIAGNOSIS — C3492 Malignant neoplasm of unspecified part of left bronchus or lung: Secondary | ICD-10-CM

## 2014-11-17 DIAGNOSIS — C7951 Secondary malignant neoplasm of bone: Secondary | ICD-10-CM | POA: Insufficient documentation

## 2014-11-17 DIAGNOSIS — E860 Dehydration: Secondary | ICD-10-CM | POA: Insufficient documentation

## 2014-11-17 LAB — CBC WITH DIFFERENTIAL/PLATELET
Basophils Absolute: 0 10*3/uL (ref 0–0.1)
Basophils Relative: 1 %
EOS PCT: 4 %
Eosinophils Absolute: 0.2 10*3/uL (ref 0–0.7)
HEMATOCRIT: 36.5 % — AB (ref 40.0–52.0)
Hemoglobin: 12 g/dL — ABNORMAL LOW (ref 13.0–18.0)
Lymphocytes Relative: 21 %
Lymphs Abs: 1 10*3/uL (ref 1.0–3.6)
MCH: 30.7 pg (ref 26.0–34.0)
MCHC: 32.8 g/dL (ref 32.0–36.0)
MCV: 93.6 fL (ref 80.0–100.0)
MONOS PCT: 6 %
Monocytes Absolute: 0.3 10*3/uL (ref 0.2–1.0)
NEUTROS ABS: 3.4 10*3/uL (ref 1.4–6.5)
Neutrophils Relative %: 68 %
Platelets: 162 10*3/uL (ref 150–440)
RBC: 3.9 MIL/uL — ABNORMAL LOW (ref 4.40–5.90)
RDW: 14.6 % — AB (ref 11.5–14.5)
WBC: 5 10*3/uL (ref 3.8–10.6)

## 2014-11-17 LAB — BASIC METABOLIC PANEL
ANION GAP: 5 (ref 5–15)
BUN: 19 mg/dL (ref 6–20)
CALCIUM: 8.3 mg/dL — AB (ref 8.9–10.3)
CHLORIDE: 105 mmol/L (ref 101–111)
CO2: 29 mmol/L (ref 22–32)
CREATININE: 0.78 mg/dL (ref 0.61–1.24)
Glucose, Bld: 118 mg/dL — ABNORMAL HIGH (ref 65–99)
POTASSIUM: 3.7 mmol/L (ref 3.5–5.1)
Sodium: 139 mmol/L (ref 135–145)

## 2014-11-17 LAB — MAGNESIUM: Magnesium: 1.9 mg/dL (ref 1.7–2.4)

## 2014-11-17 MED ORDER — DIPHENHYDRAMINE HCL 50 MG/ML IJ SOLN
50.0000 mg | Freq: Once | INTRAMUSCULAR | Status: AC
  Administered 2014-11-17: 50 mg via INTRAVENOUS
  Filled 2014-11-17: qty 1

## 2014-11-17 MED ORDER — PACLITAXEL CHEMO INJECTION 300 MG/50ML
45.0000 mg/m2 | Freq: Once | INTRAVENOUS | Status: AC
  Administered 2014-11-17: 84 mg via INTRAVENOUS
  Filled 2014-11-17: qty 14

## 2014-11-17 MED ORDER — SODIUM CHLORIDE 0.9 % IV SOLN
Freq: Once | INTRAVENOUS | Status: AC
  Administered 2014-11-17: 10:00:00 via INTRAVENOUS
  Filled 2014-11-17: qty 1000

## 2014-11-17 MED ORDER — SODIUM CHLORIDE 0.9 % IV SOLN
Freq: Once | INTRAVENOUS | Status: AC
  Administered 2014-11-17: 11:00:00 via INTRAVENOUS
  Filled 2014-11-17: qty 8

## 2014-11-17 MED ORDER — SODIUM CHLORIDE 0.9 % IV SOLN
160.0000 mg | Freq: Once | INTRAVENOUS | Status: AC
  Administered 2014-11-17: 160 mg via INTRAVENOUS
  Filled 2014-11-17: qty 16

## 2014-11-17 MED ORDER — FAMOTIDINE IN NACL 20-0.9 MG/50ML-% IV SOLN
20.0000 mg | Freq: Once | INTRAVENOUS | Status: AC
  Administered 2014-11-17: 20 mg via INTRAVENOUS
  Filled 2014-11-17: qty 50

## 2014-11-17 NOTE — Progress Notes (Signed)
Patient has Healthcare POA.  Former smoker.

## 2014-11-17 NOTE — Progress Notes (Signed)
[place] @ [facility] [phone]) [phone]  Fax:([phone]) [place]: [birth year]  MR#: [id number]  FYB#:[id number]  Patient Care Team: Albina Billet, MD as PCP - General (Internal Medicine)  CHIEF COMPLAINT:  Chief Complaint  Patient presents with  . Follow-up    Oncology History   1.  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 Further staging workup with PET scan pending (diagnosis based on  CT scan ) July, 2016 2. SB RT for a T1 lesion of the left lung superior segment left upper lobe Biopsy was consistent with non-small cell carcinoma of lung in February of 2015 3.  Started radiation and chemotherapy for left upper lobe recurrent disease versus second primary from November 10, 2014     Carcinoma, lung   10/23/2014 Initial Diagnosis Carcinoma, lung    Lung cancer   10/24/2014 Initial Diagnosis Lung cancer    Oncology Flowsheet 10/24/2014 10/24/2014 10/24/2014 10/24/2014 10/24/2014 10/25/2014 11/10/2014  Day, Cycle - - - - - - Day 1, Cycle 1  CARBOplatin (PARAPLATIN) IV - - - - - - 160 mg  dexamethasone (DECADRON) IV - - - - - - [ 20 mg ]  enoxaparin (LOVENOX) [place] 40 mg         - -  LORazepam (ATIVAN) PO 1 mg 1 mg 1 mg 0.5 mg 1 mg 1 mg -  ondansetron (ZOFRAN) IV - - - - - - [ 16 mg ]  PACLitaxel (TAXOL) IV - - - - - - 45 mg/m2    INTERVAL HISTORY: 81 year old gentleman was started on chemotherapy with platinum and Taxol starting from July of 2016.  Also starting radiation from 27th of July.  No nausea no vomiting no diarrhea appetite has been fairly good.  No cough or shortness of breath or chest pain in the left upper chest wall area Patient is not is here for second cycle of chemotherapy.  Started radiation treatment.  No nausea no vomiting no diarrhea.  No tingling numbness.  REVIEW OF SYSTEMS:   GENERAL:  Feels good.  Active.  No fevers, sweats or weight loss. PERFORMANCE STATUS (ECOG):01 HEENT:  No visual changes, runny nose, sore throat, mouth sores  or tenderness. Lungs: No shortness of breath or cough.  No hemoptysis. Cardiac:  No chest pain, palpitations, orthopnea, or PND. GI:  No nausea, vomiting, diarrhea, constipation, melena or hematochezia. GU:  No urgency, frequency, dysuria, or hematuria. Musculoskeletal:  No back pain.  No joint pain.  No muscle tenderness. Extremities:  No pain or swelling. Skin:  No rashes or skin changes. Neuro:  No headache, numbness or weakness, balance or coordination issues. Endocrine:  No diabetes, thyroid issues, hot flashes or night sweats. Psych:  No mood changes, depression or anxiety. Pain:  No focal pain. Review of systems:  All other systems reviewed and found to be negative. As per HPI. Otherwise, a complete review of systems is negatve.  PAST MEDICAL HISTORY: Past Medical History  Diagnosis Date  . Major depressive disorder, recurrent, severe with psychotic features   . Persistent disorder of initiating or maintaining sleep   . Generalized anxiety disorder   . Major depressive disorder, recurrent, severe with psychotic features   . Persistent disorder of initiating or maintaining sleep   . Asthma   . Cancer     Left lung  . COPD (chronic obstructive pulmonary disease)     PAST SURGICAL HISTORY: Past Surgical History  Procedure Laterality  Date  . Hemorroidectomy    . Lung surgery      FAMILY HISTORY Family History  Problem Relation Age of Onset  . COPD Mother   . Lung cancer Father     ADVANCED DIRECTIVES:  Patient does have advance healthcare directive, Patient   does not desire to make any changes HEALTH MAINTENANCE: History  Substance Use Topics  . Smoking status: Former Research scientist (life sciences)  . Smokeless tobacco: Never Used     Comment: quit 18 years ago  . Alcohol Use: No      Allergies  Allergen Reactions  . Penicillins     Current Outpatient Prescriptions  Medication Sig Dispense Refill  . albuterol (PROVENTIL HFA;VENTOLIN HFA) 108 (90 BASE) MCG/ACT inhaler Inhale  1-2 puffs into the lungs QID.     Marland Kitchen atorvastatin (LIPITOR) 40 MG tablet Take 1 tablet by mouth daily.    Marland Kitchen azithromycin (ZITHROMAX) 250 MG tablet 1 tab po daily for 3 days 6 each 0  . cefUROXime (CEFTIN) 500 MG tablet Take 1 tablet (500 mg total) by mouth 2 (two) times daily with a meal. 10 tablet 0  . econazole nitrate 1 % cream Apply 1 application topically 2 (two) times daily.     Marland Kitchen esomeprazole (NEXIUM) 40 MG capsule Take 40 mg by mouth daily at 12 noon.    Marland Kitchen esomeprazole (NEXIUM) 40 MG packet Take 1 packet by mouth daily.    Marland Kitchen LORazepam (ATIVAN) 1 MG tablet Take 1 tablet 3 times a day and one half a tablet once a day. 315 tablet 0  . mometasone-formoterol (DULERA) 200-5 MCG/ACT AERO Inhale 2 puffs into the lungs 2 (two) times daily.     . ondansetron (ZOFRAN) 8 MG tablet Take 1 tablet (8 mg total) by mouth 2 (two) times daily. Start the day after chemo for 3 days. Then take as needed for nausea or vomiting. 30 tablet 1  . prochlorperazine (COMPAZINE) 10 MG tablet Take 1 tablet (10 mg total) by mouth every 6 (six) hours as needed (Nausea or vomiting). 30 tablet 1  . QUEtiapine (SEROQUEL XR) 300 MG 24 hr tablet Take 1 tablet (300 mg total) by mouth at bedtime. 90 tablet 0  . tamsulosin (FLOMAX) 0.4 MG CAPS capsule Take 1 capsule by mouth daily.    Marland Kitchen tiotropium (SPIRIVA) 18 MCG inhalation capsule Place into inhaler and inhale daily.    Marland Kitchen triamcinolone cream (KENALOG) 0.1 %     . venlafaxine XR (EFFEXOR-XR) 150 MG 24 hr capsule Take 1 capsule (150 mg total) by mouth daily. 90 capsule 0  . zolpidem (AMBIEN) 10 MG tablet Take 1 tablet (10 mg total) by mouth at bedtime. 30 tablet 2   No current facility-administered medications for this visit.    OBJECTIVE:  Filed Vitals:   11/17/14 0906  BP: 148/51  Pulse: 101  Temp: 95.6 F (35.3 C)     Body mass index is 23.8 kg/(m^2).    ECOG FS:1 - Symptomatic but completely ambulatory  PHYSICAL EXAM: GENERAL:  Well developed, well nourished,  sitting comfortably in the exam room in no acute distress. MENTAL STATUS:  Alert and oriented to person, place and time. HEAD: Alopecia  Normocephalic, atraumatic, face symmetric, no Cushingoid features. EYES:  Pupils equal round and reactive to light and accomodation.  No conjunctivitis or scleral icterus. ENT:  Oropharynx clear without lesion.  Tongue normal. Mucous membranes moist.  RESPIRATORY:  Clear to auscultation without rales, wheezes or rhonchi. CARDIOVASCULAR:  Regular rate and  rhythm without murmur, rub or gallop. BREAST:  Right breast without masses, skin changes or nipple discharge.  Left breast without masses, skin changes or nipple discharge. ABDOMEN:  Soft, non-tender, with active bowel sounds, and no hepatosplenomegaly.  No masses. BACK:  No CVA tenderness.  No tenderness on percussion of the back or rib cage. SKIN:  No rashes, ulcers or lesions. EXTREMITIES: No edema, no skin discoloration or tenderness.  No palpable cords. LYMPH NODES: No palpable cervical, supraclavicular, axillary or inguinal adenopathy  NEUROLOGICAL: Unremarkable. PSYCH:  Appropriate.   LAB RESULTS:  Appointment on 11/17/2014  Component Date Value Ref Range Status  . WBC 11/17/2014 5.0  3.8 - 10.6 K/uL Final  . RBC 11/17/2014 3.90* 4.40 - 5.90 MIL/uL Final  . Hemoglobin 11/17/2014 12.0* 13.0 - 18.0 g/dL Final  . HCT 11/17/2014 36.5* 40.0 - 52.0 % Final  . MCV 11/17/2014 93.6  80.0 - 100.0 fL Final  . MCH 11/17/2014 30.7  26.0 - 34.0 pg Final  . MCHC 11/17/2014 32.8  32.0 - 36.0 g/dL Final  . RDW 11/17/2014 14.6* 11.5 - 14.5 % Final  . Platelets 11/17/2014 162  150 - 440 K/uL Final  . Neutrophils Relative % 11/17/2014 68   Final  . Neutro Abs 11/17/2014 3.4  1.4 - 6.5 K/uL Final  . Lymphocytes Relative 11/17/2014 21   Final  . Lymphs Abs 11/17/2014 1.0  1.0 - 3.6 K/uL Final  . Monocytes Relative 11/17/2014 6   Final  . Monocytes Absolute 11/17/2014 0.3  0.2 - 1.0 K/uL Final  . Eosinophils  Relative 11/17/2014 4   Final  . Eosinophils Absolute 11/17/2014 0.2  0 - 0.7 K/uL Final  . Basophils Relative 11/17/2014 1   Final  . Basophils Absolute 11/17/2014 0.0  0 - 0.1 K/uL Final  . Sodium 11/17/2014 139  135 - 145 mmol/L Final  . Potassium 11/17/2014 3.7  3.5 - 5.1 mmol/L Final  . Chloride 11/17/2014 105  101 - 111 mmol/L Final  . CO2 11/17/2014 29  22 - 32 mmol/L Final  . Glucose, Bld 11/17/2014 118* 65 - 99 mg/dL Final  . BUN 11/17/2014 19  6 - 20 mg/dL Final  . Creatinine, Ser 11/17/2014 0.78  0.61 - 1.24 mg/dL Final  . Calcium 11/17/2014 8.3* 8.9 - 10.3 mg/dL Final  . GFR calc non Af Amer 11/17/2014 >60  >60 mL/min Final  . GFR calc Af Amer 11/17/2014 >60  >60 mL/min Final   Comment: (NOTE) The eGFR has been calculated using the CKD EPI equation. This calculation has not been validated in all clinical situations. eGFR's persistently <60 mL/min signify possible Chronic Kidney Disease.   . Anion gap 11/17/2014 5  5 - 15 Final  . Magnesium 11/17/2014 1.9  1.7 - 2.4 mg/dL Final      STUDIES: Ct Angio Chest Pe W/cm &/or Wo Cm  10/23/2014   CLINICAL DATA:  Lung cancer and shortness of breath. Evaluate for pulmonary embolism.  EXAM: CT ANGIOGRAPHY CHEST WITH CONTRAST  TECHNIQUE: Multidetector CT imaging of the chest was performed using the standard protocol during bolus administration of intravenous contrast. Multiplanar CT image reconstructions and MIPs were obtained to evaluate the vascular anatomy.  CONTRAST:  8m OMNIPAQUE IOHEXOL 350 MG/ML SOLN  COMPARISON:  10/13/2014  FINDINGS: THORACIC INLET/BODY WALL:  No acute abnormality.  MEDIASTINUM:  Normal heart size. No pericardial effusion. Diffuse atherosclerosis, including the coronary arteries. CTA of the pulmonary arteries is limited by intermittent motion, but exam is overall  diagnostic and negative for pulmonary embolism. No aortic dissection or aneurysm. No lymphadenopathy.  LUNG WINDOWS:  Stable lytic metastasis within the  left T3 body and posterior elements also involving the left third and fourth rib head and necks.  There is treatment change with scarring on the left.  Emphysema.  Extensive mucoid impaction in segmental airway is of the medial right lower lobe. No pneumonia, edema, effusion, or pneumothorax.  UPPER ABDOMEN:  No acute findings.  OSSEOUS:  Metastatic disease as noted above. There is spondyloarthropathy with bridging osteophytes throughout the mid lower thoracic spine  Review of the MIP images confirms the above findings.  IMPRESSION: 1. No evidence of pulmonary embolism. 2. Right lower lobe bronchial impaction which could be from aspiration or airway infection. 3. Stable metastasis eroding the left T3 vertebra and third, fourth ribs. 4. Emphysema.   Electronically Signed   By: Monte Fantasia M.D.   On: 10/23/2014 22:35   Nm Pet Image Initial (pi) Skull Base To Thigh  10/31/2014   CLINICAL DATA:  Subsequent treatment strategy for lung cancer.  EXAM: NUCLEAR MEDICINE PET SKULL BASE TO THIGH  TECHNIQUE: 12.0 mCi F-18 FDG was injected intravenously. Full-ring PET imaging was performed from the skull base to thigh after the radiotracer. CT data was obtained and used for attenuation correction and anatomic localization.  FASTING BLOOD GLUCOSE:  Value: 79 mg/dl  COMPARISON:  CT chest 10/13/2014 and PET 05/02/2013.  FINDINGS: NECK  No hypermetabolic lymph nodes in the neck. CT images show no acute findings.  CHEST  No hypermetabolic mediastinal, hilar or axillary lymph nodes. No hypermetabolic pulmonary nodules. CT images show atherosclerotic calcification of the arterial vasculature, including coronary arteries. No pericardial or pleural effusion. Postoperative changes and scarring in the left hemi thorax.  ABDOMEN/PELVIS  No abnormal hypermetabolism in the liver, adrenal glands, spleen or pancreas. No hypermetabolic lymph nodes. CT images show the liver, gallbladder and adrenal glands to be grossly unremarkable. 13 mm  low-attenuation lesion in the right kidney is likely a cyst although definitive characterization is difficult due to size and lack of postcontrast imaging. Left kidney, spleen, pancreas, stomach and bowel are grossly unremarkable. No free fluid.  SKELETON  A hypermetabolic 2.8 x 3.8 cm destructive soft tissue mass is seen in the medial aspect of the left third rib, with invasion of the adjacent T3 vertebral body and an SUV max of 49.6. No additional abnormal osseous hypermetabolism. Degenerative changes are seen throughout the spine.  IMPRESSION: 1. Intensely hypermetabolic destructive soft tissue mass centered in the medial left third rib, with involvement of the adjacent T3 vertebral body. 2. No additional areas of abnormal hypermetabolism in the neck, chest, abdomen or pelvis. 3. Coronary artery calcification.   Electronically Signed   By: Lorin Picket M.D.   On: 10/31/2014 09:54   Dg Chest Portable 1 View  10/23/2014   CLINICAL DATA:  Shortness of Breath.  Right-sided chest pain  EXAM: PORTABLE CHEST - 1 VIEW  COMPARISON:  Chest CT October 13, 2014; chest radiograph August 12, 2014  FINDINGS: There are scattered areas of scarring bilaterally, essentially stable. There is no appreciable edema or consolidation. The heart size is normal. Pulmonary vascularity is stable and within normal limits. No adenopathy. Soft tissue prominence in the medial left apex from the in known paraspinal mass at T3-4 on the left side is again noted but better seen on recent CT.  IMPRESSION: The known mass on the left in the T3-4 paraspinous region is present  but better seen on recent CT. Areas of parenchymal lung scarring are stable. There is no demonstrable lung edema or consolidation. No change in cardiac silhouette.   Electronically Signed   By: Lowella Grip III M.D.   On: 10/23/2014 20:31    ASSESSMENT: Recurrent or another primary with left upper lobe invading vertebral body. Patient is starting chemotherapy radiation  therapy Will continue second cycle of chemotherapy and continue radiation therapy Overall tolerance is fairly good MEDICAL DECISION MAKING:  Carboplatinum Taxol weekly with radiation therapy Patient and number of questions which were answered.   Patient expressed understanding and was in agreement with this plan. He also understands that He can call clinic at any time with any questions, concerns, or complaints.    Carcinoma, lung   Staging form: Lung, AJCC 7th Edition     Clinical: Stage IIIA (T4, N0, M0) - Signed by Forest Gleason, MD on 10/29/2014   Forest Gleason, MD   11/17/2014 9:24 AM

## 2014-11-18 ENCOUNTER — Ambulatory Visit
Admission: RE | Admit: 2014-11-18 | Discharge: 2014-11-18 | Disposition: A | Discharge status: Home / Self Care | Payer: Medicare HMO | Source: Ambulatory Visit | Attending: Radiation Oncology | Admitting: Radiation Oncology

## 2014-11-18 DIAGNOSIS — Z510 Encounter for antineoplastic radiation therapy: Secondary | ICD-10-CM | POA: Diagnosis not present

## 2014-11-19 ENCOUNTER — Ambulatory Visit
Admission: RE | Admit: 2014-11-19 | Discharge: 2014-11-19 | Disposition: A | Discharge status: Home / Self Care | Payer: Medicare HMO | Source: Ambulatory Visit | Attending: Radiation Oncology | Admitting: Radiation Oncology

## 2014-11-19 DIAGNOSIS — Z510 Encounter for antineoplastic radiation therapy: Secondary | ICD-10-CM | POA: Diagnosis not present

## 2014-11-20 ENCOUNTER — Ambulatory Visit
Admission: RE | Admit: 2014-11-20 | Discharge: 2014-11-20 | Disposition: A | Discharge status: Home / Self Care | Payer: Medicare HMO | Source: Ambulatory Visit | Attending: Radiation Oncology | Admitting: Radiation Oncology

## 2014-11-20 DIAGNOSIS — Z510 Encounter for antineoplastic radiation therapy: Secondary | ICD-10-CM | POA: Diagnosis not present

## 2014-11-21 ENCOUNTER — Ambulatory Visit
Admission: RE | Admit: 2014-11-21 | Discharge: 2014-11-21 | Disposition: A | Discharge status: Home / Self Care | Payer: Medicare HMO | Source: Ambulatory Visit | Attending: Radiation Oncology | Admitting: Radiation Oncology

## 2014-11-21 DIAGNOSIS — Z510 Encounter for antineoplastic radiation therapy: Secondary | ICD-10-CM | POA: Diagnosis not present

## 2014-11-24 ENCOUNTER — Ambulatory Visit
Admission: RE | Admit: 2014-11-24 | Discharge: 2014-11-24 | Disposition: A | Discharge status: Home / Self Care | Payer: Medicare HMO | Source: Ambulatory Visit | Attending: Radiation Oncology | Admitting: Radiation Oncology

## 2014-11-24 ENCOUNTER — Inpatient Hospital Stay: Payer: Medicare HMO

## 2014-11-24 ENCOUNTER — Inpatient Hospital Stay (HOSPITAL_BASED_OUTPATIENT_CLINIC_OR_DEPARTMENT_OTHER): Payer: Medicare HMO | Admitting: Oncology

## 2014-11-24 ENCOUNTER — Encounter: Payer: Self-pay | Admitting: Oncology

## 2014-11-24 VITALS — BP 155/65 | HR 109 | Temp 96.7°F | Wt 151.9 lb

## 2014-11-24 DIAGNOSIS — C3492 Malignant neoplasm of unspecified part of left bronchus or lung: Secondary | ICD-10-CM

## 2014-11-24 DIAGNOSIS — Z79899 Other long term (current) drug therapy: Secondary | ICD-10-CM | POA: Diagnosis not present

## 2014-11-24 DIAGNOSIS — C7951 Secondary malignant neoplasm of bone: Secondary | ICD-10-CM | POA: Diagnosis not present

## 2014-11-24 DIAGNOSIS — Z510 Encounter for antineoplastic radiation therapy: Secondary | ICD-10-CM | POA: Diagnosis not present

## 2014-11-24 DIAGNOSIS — C3412 Malignant neoplasm of upper lobe, left bronchus or lung: Secondary | ICD-10-CM | POA: Diagnosis not present

## 2014-11-24 DIAGNOSIS — B371 Pulmonary candidiasis: Secondary | ICD-10-CM

## 2014-11-24 DIAGNOSIS — Z5111 Encounter for antineoplastic chemotherapy: Secondary | ICD-10-CM | POA: Diagnosis not present

## 2014-11-24 LAB — COMPREHENSIVE METABOLIC PANEL
ALT: 14 U/L — ABNORMAL LOW (ref 17–63)
AST: 18 U/L (ref 15–41)
Albumin: 3.5 g/dL (ref 3.5–5.0)
Alkaline Phosphatase: 63 U/L (ref 38–126)
Anion gap: 7 (ref 5–15)
BUN: 24 mg/dL — ABNORMAL HIGH (ref 6–20)
CALCIUM: 8.5 mg/dL — AB (ref 8.9–10.3)
CHLORIDE: 102 mmol/L (ref 101–111)
CO2: 27 mmol/L (ref 22–32)
Creatinine, Ser: 0.86 mg/dL (ref 0.61–1.24)
GFR calc Af Amer: >60 mL/min (ref >60)
GFR calc non Af Amer: >60 mL/min (ref >60)
Glucose, Bld: 128 mg/dL — ABNORMAL HIGH (ref 65–99)
POTASSIUM: 4.1 mmol/L (ref 3.5–5.1)
Sodium: 136 mmol/L (ref 135–145)
TOTAL PROTEIN: 6.9 g/dL (ref 6.5–8.1)
Total Bilirubin: 0.2 mg/dL — ABNORMAL LOW (ref 0.3–1.2)

## 2014-11-24 LAB — CBC WITH DIFFERENTIAL/PLATELET
BASOS ABS: 0 10*3/uL (ref 0–0.1)
Basophils Relative: 1 %
Eosinophils Absolute: 0.1 10*3/uL (ref 0–0.7)
Eosinophils Relative: 3 %
HCT: 34.2 % — ABNORMAL LOW (ref 40.0–52.0)
HEMOGLOBIN: 11.5 g/dL — AB (ref 13.0–18.0)
LYMPHS ABS: 0.8 10*3/uL — AB (ref 1.0–3.6)
Lymphocytes Relative: 25 %
MCH: 31.2 pg (ref 26.0–34.0)
MCHC: 33.5 g/dL (ref 32.0–36.0)
MCV: 93 fL (ref 80.0–100.0)
Monocytes Absolute: 0.4 10*3/uL (ref 0.2–1.0)
Monocytes Relative: 11 %
Neutro Abs: 1.9 10*3/uL (ref 1.4–6.5)
Neutrophils Relative %: 60 %
Platelets: 181 10*3/uL (ref 150–440)
RBC: 3.68 MIL/uL — ABNORMAL LOW (ref 4.40–5.90)
RDW: 14.4 % (ref 11.5–14.5)
WBC: 3.3 10*3/uL — AB (ref 3.8–10.6)

## 2014-11-24 MED ORDER — SODIUM CHLORIDE 0.9 % IV SOLN
160.0000 mg | Freq: Once | INTRAVENOUS | Status: AC
  Administered 2014-11-24: 160 mg via INTRAVENOUS
  Filled 2014-11-24: qty 16

## 2014-11-24 MED ORDER — DIPHENHYDRAMINE HCL 50 MG/ML IJ SOLN
50.0000 mg | Freq: Once | INTRAMUSCULAR | Status: AC
  Administered 2014-11-24: 50 mg via INTRAVENOUS
  Filled 2014-11-24: qty 1

## 2014-11-24 MED ORDER — SODIUM CHLORIDE 0.9 % IV SOLN
Freq: Once | INTRAVENOUS | Status: AC
  Administered 2014-11-24: 10:00:00 via INTRAVENOUS
  Filled 2014-11-24: qty 8

## 2014-11-24 MED ORDER — HYDROCODONE-ACETAMINOPHEN 5-325 MG PO TABS: 1.0000 | ORAL_TABLET | ORAL | Status: DC | PRN

## 2014-11-24 MED ORDER — SODIUM CHLORIDE 0.9 % IV SOLN
Freq: Once | INTRAVENOUS | Status: AC
  Administered 2014-11-24: 10:00:00 via INTRAVENOUS
  Filled 2014-11-24: qty 1000

## 2014-11-24 MED ORDER — PACLITAXEL CHEMO INJECTION 300 MG/50ML
45.0000 mg/m2 | Freq: Once | INTRAVENOUS | Status: AC
  Administered 2014-11-24: 84 mg via INTRAVENOUS
  Filled 2014-11-24: qty 14

## 2014-11-24 MED ORDER — FAMOTIDINE IN NACL 20-0.9 MG/50ML-% IV SOLN
20.0000 mg | Freq: Once | INTRAVENOUS | Status: AC
  Administered 2014-11-24: 20 mg via INTRAVENOUS
  Filled 2014-11-24: qty 50

## 2014-11-24 NOTE — Progress Notes (Signed)
[place] @ [facility] Telephone:(336) [phone]  Fax:(336) [place]: 1929-06-07  MR#: [id number]  [facility]#:[id number]  Patient Care Team: Albina Billet, MD as PCP - General (Internal Medicine)  CHIEF COMPLAINT:  Chief Complaint  Patient presents with  . Follow-up    Oncology History   1.  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 Further staging workup with PET scan pending (diagnosis based on  CT scan ) July, 2016 2. SB RT for a T1 lesion of the left lung superior segment left upper lobe Biopsy was consistent with non-small cell carcinoma of lung in February of 2015 3.  Started radiation and chemotherapy for left upper lobe recurrent disease versus second primary from November 10, 2014     Carcinoma, lung   10/23/2014 Initial Diagnosis Carcinoma, lung    Lung cancer   10/24/2014 Initial Diagnosis Lung cancer    Oncology Flowsheet 10/24/2014 10/24/2014 10/24/2014 10/24/2014 10/25/2014 11/10/2014 11/17/2014  Day, Cycle - - - - - Day 1, Cycle 1 Day 1, Cycle 2  CARBOplatin (PARAPLATIN) IV - - - - - 160 mg 160 mg  dexamethasone (DECADRON) IV - - - - - [ 20 mg ] [ 20 mg ]  enoxaparin (LOVENOX) [place]         - - -  LORazepam (ATIVAN) PO 1 mg 1 mg 0.5 mg 1 mg 1 mg - -  ondansetron (ZOFRAN) IV - - - - - [ 16 mg ] [ 16 mg ]  PACLitaxel (TAXOL) IV - - - - - 45 mg/m2 45 mg/m2    INTERVAL HISTORY: 81 year old gentleman was started on chemotherapy with platinum and Taxol starting from July of 2016.  Also starting radiation from 27th of July.  No nausea no vomiting no diarrhea appetite has been fairly good.  No cough or shortness of breath or chest pain in the left upper chest wall area Patient is not is here for second cycle of chemotherapy.  Started radiation treatment.  No nausea no vomiting no diarrhea.  No tingling numbness. November 24, 2014 Patient is here complaining of pain in the left shoulder has 5 hacking cough.  No hemoptysis.  Concurrent chemoradiation no tingling  numbness the soreness in the mouth.  Alopecia. REVIEW OF SYSTEMS:   GENERAL:  Feeling somewhat weak and tired. PERFORMANCE STATUS (ECOG):01 HEENT:  No visual changes, runny nose, sore throat, mouth sores or tenderness. Lungs: Dry hacking cough.  No hemoptysis. Cardiac:  No chest pain, palpitations, orthopnea, or PND. GI:  No nausea, vomiting, diarrhea, constipation, melena or hematochezia. GU:  No urgency, frequency, dysuria, or hematuria. Musculoskeletal:  Left shoulder pain Extremities:  No pain or swelling. Skin:  No rashes or skin changes. Neuro:  No headache, numbness or weakness, balance or coordination issues. Endocrine:  No diabetes, thyroid issues, hot flashes or night sweats. Psych:  No mood changes, depression or anxiety. Pain:  No focal pain. Review of systems:  All other systems reviewed and found to be negative. As per HPI. Otherwise, a complete review of systems is negatve.  PAST MEDICAL HISTORY: Past Medical History  Diagnosis Date  . Major depressive disorder, recurrent, severe with psychotic features   . Persistent disorder of initiating or maintaining sleep   . Generalized anxiety disorder   . Major depressive disorder, recurrent, severe with psychotic features   . Persistent disorder of initiating or maintaining sleep   . Asthma   . Cancer  Left lung  . COPD (chronic obstructive pulmonary disease)     PAST SURGICAL HISTORY: Past Surgical History  Procedure Laterality Date  . Hemorroidectomy    . Lung surgery      FAMILY HISTORY Family History  Problem Relation Age of Onset  . COPD Mother   . Lung cancer Father     ADVANCED DIRECTIVES:  Patient does have advance healthcare directive, Patient   does not desire to make any changes HEALTH MAINTENANCE: History  Substance Use Topics  . Smoking status: Former Research scientist (life sciences)  . Smokeless tobacco: Never Used     Comment: quit 18 years ago  . Alcohol Use: No      Allergies  Allergen Reactions  .  Penicillins     Current Outpatient Prescriptions  Medication Sig Dispense Refill  . albuterol (PROVENTIL HFA;VENTOLIN HFA) 108 (90 BASE) MCG/ACT inhaler Inhale 1-2 puffs into the lungs QID.     Marland Kitchen atorvastatin (LIPITOR) 40 MG tablet Take 1 tablet by mouth daily.    Marland Kitchen econazole nitrate 1 % cream Apply 1 application topically 2 (two) times daily.     Marland Kitchen esomeprazole (NEXIUM) 40 MG capsule Take 40 mg by mouth daily at 12 noon.    Marland Kitchen LORazepam (ATIVAN) 1 MG tablet Take 1 tablet 3 times a day and one half a tablet once a day. 315 tablet 0  . mometasone-formoterol (DULERA) 200-5 MCG/ACT AERO Inhale 2 puffs into the lungs 2 (two) times daily.     . ondansetron (ZOFRAN) 8 MG tablet Take 1 tablet (8 mg total) by mouth 2 (two) times daily. Start the day after chemo for 3 days. Then take as needed for nausea or vomiting. 30 tablet 1  . prochlorperazine (COMPAZINE) 10 MG tablet Take 1 tablet (10 mg total) by mouth every 6 (six) hours as needed (Nausea or vomiting). 30 tablet 1  . QUEtiapine (SEROQUEL XR) 300 MG 24 hr tablet Take 1 tablet (300 mg total) by mouth at bedtime. 90 tablet 0  . tamsulosin (FLOMAX) 0.4 MG CAPS capsule Take 1 capsule by mouth daily.    Marland Kitchen tiotropium (SPIRIVA) 18 MCG inhalation capsule Place into inhaler and inhale daily.    Marland Kitchen triamcinolone cream (KENALOG) 0.1 %     . venlafaxine XR (EFFEXOR-XR) 150 MG 24 hr capsule Take 1 capsule (150 mg total) by mouth daily. 90 capsule 0  . zolpidem (AMBIEN) 10 MG tablet Take 1 tablet (10 mg total) by mouth at bedtime. 30 tablet 2  . azithromycin (ZITHROMAX) 250 MG tablet 1 tab po daily for 3 days (Patient not taking: Reported on 11/24/2014) 6 each 0  . cefUROXime (CEFTIN) 500 MG tablet Take 1 tablet (500 mg total) by mouth 2 (two) times daily with a meal. (Patient not taking: Reported on 11/24/2014) 10 tablet 0  . esomeprazole (NEXIUM) 40 MG packet Take 1 packet by mouth daily.    Marland Kitchen HYDROcodone-acetaminophen (NORCO/VICODIN) 5-325 MG per tablet Take 1  tablet by mouth every 4 (four) hours as needed for moderate pain. 30 tablet 0   No current facility-administered medications for this visit.    OBJECTIVE:  Filed Vitals:   11/24/14 0851  BP: 155/65  Pulse: 109  Temp: 96.7 F (35.9 C)     Body mass index is 23.43 kg/(m^2).    ECOG FS:1 - Symptomatic but completely ambulatory  PHYSICAL EXAM: GENERAL:  Well developed, well nourished, sitting comfortably in the exam room in no acute distress. MENTAL STATUS:  Alert and oriented to  person, place and time. HEAD: Alopecia  Normocephalic, atraumatic, face symmetric, no Cushingoid features. EYES:  Pupils equal round and reactive to light and accomodation.  No conjunctivitis or scleral icterus. ENT:  Oropharynx clear without lesion.  Tongue normal. Mucous membranes moist.  RESPIRATORY:  Clear to auscultation without rales, wheezes or rhonchi. CARDIOVASCULAR:  Regular rate and rhythm without murmur, rub or gallop. BREAST:  Right breast without masses, skin changes or nipple discharge.  Left breast without masses, skin changes or nipple discharge. ABDOMEN:  Soft, non-tender, with active bowel sounds, and no hepatosplenomegaly.  No masses. BACK:  No CVA tenderness.  No tenderness on percussion of the back or rib cage. SKIN:  No rashes, ulcers or lesions. EXTREMITIES: No edema, no skin discoloration or tenderness.  No palpable cords. LYMPH NODES: No palpable cervical, supraclavicular, axillary or inguinal adenopathy  NEUROLOGICAL: Unremarkable. PSYCH:  Appropriate.   LAB RESULTS:  Appointment on 11/24/2014  Component Date Value Ref Range Status  . WBC 11/24/2014 3.3* 3.8 - 10.6 K/uL Final  . RBC 11/24/2014 3.68* 4.40 - 5.90 MIL/uL Final  . Hemoglobin 11/24/2014 11.5* 13.0 - 18.0 g/dL Final  . HCT 11/24/2014 34.2* 40.0 - 52.0 % Final  . MCV 11/24/2014 93.0  80.0 - 100.0 fL Final  . MCH 11/24/2014 31.2  26.0 - 34.0 pg Final  . MCHC 11/24/2014 33.5  32.0 - 36.0 g/dL Final  . RDW 11/24/2014  14.4  11.5 - 14.5 % Final  . Platelets 11/24/2014 181  150 - 440 K/uL Final  . Neutrophils Relative % 11/24/2014 60   Final  . Neutro Abs 11/24/2014 1.9  1.4 - 6.5 K/uL Final  . Lymphocytes Relative 11/24/2014 25   Final  . Lymphs Abs 11/24/2014 0.8* 1.0 - 3.6 K/uL Final  . Monocytes Relative 11/24/2014 11   Final  . Monocytes Absolute 11/24/2014 0.4  0.2 - 1.0 K/uL Final  . Eosinophils Relative 11/24/2014 3   Final  . Eosinophils Absolute 11/24/2014 0.1  0 - 0.7 K/uL Final  . Basophils Relative 11/24/2014 1   Final  . Basophils Absolute 11/24/2014 0.0  0 - 0.1 K/uL Final  . Sodium 11/24/2014 136  135 - 145 mmol/L Final  . Potassium 11/24/2014 4.1  3.5 - 5.1 mmol/L Final  . Chloride 11/24/2014 102  101 - 111 mmol/L Final  . CO2 11/24/2014 27  22 - 32 mmol/L Final  . Glucose, Bld 11/24/2014 128* 65 - 99 mg/dL Final  . BUN 11/24/2014 24* 6 - 20 mg/dL Final  . Creatinine, Ser 11/24/2014 0.86  0.61 - 1.24 mg/dL Final  . Calcium 11/24/2014 8.5* 8.9 - 10.3 mg/dL Final  . Total Protein 11/24/2014 6.9  6.5 - 8.1 g/dL Final  . Albumin 11/24/2014 3.5  3.5 - 5.0 g/dL Final  . AST 11/24/2014 18  15 - 41 U/L Final  . ALT 11/24/2014 14* 17 - 63 U/L Final  . Alkaline Phosphatase 11/24/2014 63  38 - 126 U/L Final  . Total Bilirubin 11/24/2014 0.2* 0.3 - 1.2 mg/dL Final  . GFR calc non Af Amer 11/24/2014 >60  >60 mL/min Final  . GFR calc Af Amer 11/24/2014 >60  >60 mL/min Final   Comment: (NOTE) The eGFR has been calculated using the CKD EPI equation. This calculation has not been validated in all clinical situations. eGFR's persistently <60 mL/min signify possible Chronic Kidney Disease.   . Anion gap 11/24/2014 7  5 - 15 Final      STUDIES: Nm Pet Image  Initial (pi) Skull Base To Thigh  10/31/2014   CLINICAL DATA:  Subsequent treatment strategy for lung cancer.  EXAM: NUCLEAR MEDICINE PET SKULL BASE TO THIGH  TECHNIQUE: 12.0 mCi F-18 FDG was injected intravenously. Full-ring PET imaging was  performed from the skull base to thigh after the radiotracer. CT data was obtained and used for attenuation correction and anatomic localization.  FASTING BLOOD GLUCOSE:  Value: 79 mg/dl  COMPARISON:  CT chest 10/13/2014 and PET 05/02/2013.  FINDINGS: NECK  No hypermetabolic lymph nodes in the neck. CT images show no acute findings.  CHEST  No hypermetabolic mediastinal, hilar or axillary lymph nodes. No hypermetabolic pulmonary nodules. CT images show atherosclerotic calcification of the arterial vasculature, including coronary arteries. No pericardial or pleural effusion. Postoperative changes and scarring in the left hemi thorax.  ABDOMEN/PELVIS  No abnormal hypermetabolism in the liver, adrenal glands, spleen or pancreas. No hypermetabolic lymph nodes. CT images show the liver, gallbladder and adrenal glands to be grossly unremarkable. 13 mm low-attenuation lesion in the right kidney is likely a cyst although definitive characterization is difficult due to size and lack of postcontrast imaging. Left kidney, spleen, pancreas, stomach and bowel are grossly unremarkable. No free fluid.  SKELETON  A hypermetabolic 2.8 x 3.8 cm destructive soft tissue mass is seen in the medial aspect of the left third rib, with invasion of the adjacent T3 vertebral body and an SUV max of 49.6. No additional abnormal osseous hypermetabolism. Degenerative changes are seen throughout the spine.  IMPRESSION: 1. Intensely hypermetabolic destructive soft tissue mass centered in the medial left third rib, with involvement of the adjacent T3 vertebral body. 2. No additional areas of abnormal hypermetabolism in the neck, chest, abdomen or pelvis. 3. Coronary artery calcification.   Electronically Signed   By: Lorin Picket M.D.   On: 10/31/2014 09:54    ASSESSMENT: Recurrent or another primary with left upper lobe invading vertebral body. He should not is getting concurrent chemoradiation therapy Carboplatinum Taxol weekly with  radiation therapy Patient and number of questions which were answered. Left shoulder pain and Vicodin was prescribed.  Will continue chemotherapy all lab data has been reviewed.  Patient expressed understanding and was in agreement with this plan. He also understands that He can call clinic at any time with any questions, concerns, or complaints.    Carcinoma, lung   Staging form: Lung, AJCC 7th Edition     Clinical: Stage IIIA (T4, N0, M0) - Signed by Forest Gleason, MD on 10/29/2014   Forest Gleason, MD   11/24/2014 9:21 AM

## 2014-11-24 NOTE — Addendum Note (Signed)
Addended by: Telford Nab on: 11/24/2014 09:30 AM   Modules accepted: Orders

## 2014-11-24 NOTE — Progress Notes (Signed)
Patient does have living will.  Former smoker.  Patient developed pain in right shoulder last night after episode of coughing.

## 2014-11-25 ENCOUNTER — Ambulatory Visit
Admission: RE | Admit: 2014-11-25 | Discharge: 2014-11-25 | Disposition: A | Discharge status: Home / Self Care | Payer: Medicare HMO | Source: Ambulatory Visit | Attending: Radiation Oncology | Admitting: Radiation Oncology

## 2014-11-25 DIAGNOSIS — Z510 Encounter for antineoplastic radiation therapy: Secondary | ICD-10-CM | POA: Diagnosis not present

## 2014-11-26 ENCOUNTER — Ambulatory Visit
Admission: RE | Admit: 2014-11-26 | Discharge: 2014-11-26 | Disposition: A | Discharge status: Home / Self Care | Payer: Medicare HMO | Source: Ambulatory Visit | Attending: Radiation Oncology | Admitting: Radiation Oncology

## 2014-11-26 DIAGNOSIS — Z510 Encounter for antineoplastic radiation therapy: Secondary | ICD-10-CM | POA: Diagnosis not present

## 2014-11-27 ENCOUNTER — Ambulatory Visit
Admission: RE | Admit: 2014-11-27 | Discharge: 2014-11-27 | Disposition: A | Discharge status: Home / Self Care | Payer: Medicare HMO | Source: Ambulatory Visit | Attending: Radiation Oncology | Admitting: Radiation Oncology

## 2014-11-27 DIAGNOSIS — Z510 Encounter for antineoplastic radiation therapy: Secondary | ICD-10-CM | POA: Diagnosis not present

## 2014-11-28 ENCOUNTER — Ambulatory Visit
Admission: RE | Admit: 2014-11-28 | Discharge: 2014-11-28 | Disposition: A | Discharge status: Home / Self Care | Payer: Medicare HMO | Source: Ambulatory Visit | Attending: Radiation Oncology | Admitting: Radiation Oncology

## 2014-11-28 DIAGNOSIS — Z510 Encounter for antineoplastic radiation therapy: Secondary | ICD-10-CM | POA: Diagnosis not present

## 2014-12-01 ENCOUNTER — Inpatient Hospital Stay: Payer: Medicare HMO

## 2014-12-01 ENCOUNTER — Inpatient Hospital Stay (HOSPITAL_BASED_OUTPATIENT_CLINIC_OR_DEPARTMENT_OTHER): Payer: Medicare HMO | Admitting: Oncology

## 2014-12-01 ENCOUNTER — Encounter: Payer: Self-pay | Admitting: Oncology

## 2014-12-01 ENCOUNTER — Ambulatory Visit
Admission: RE | Admit: 2014-12-01 | Discharge: 2014-12-01 | Disposition: A | Discharge status: Home / Self Care | Payer: Medicare HMO | Source: Ambulatory Visit | Attending: Radiation Oncology | Admitting: Radiation Oncology

## 2014-12-01 VITALS — BP 124/69 | HR 80 | Resp 18

## 2014-12-01 VITALS — BP 177/90 | HR 94 | Temp 96.6°F | Resp 20 | Wt 155.4 lb

## 2014-12-01 DIAGNOSIS — C3412 Malignant neoplasm of upper lobe, left bronchus or lung: Secondary | ICD-10-CM

## 2014-12-01 DIAGNOSIS — Z79899 Other long term (current) drug therapy: Secondary | ICD-10-CM | POA: Diagnosis not present

## 2014-12-01 DIAGNOSIS — C7951 Secondary malignant neoplasm of bone: Secondary | ICD-10-CM

## 2014-12-01 DIAGNOSIS — Z5111 Encounter for antineoplastic chemotherapy: Secondary | ICD-10-CM | POA: Diagnosis not present

## 2014-12-01 DIAGNOSIS — C3492 Malignant neoplasm of unspecified part of left bronchus or lung: Secondary | ICD-10-CM

## 2014-12-01 DIAGNOSIS — Z510 Encounter for antineoplastic radiation therapy: Secondary | ICD-10-CM | POA: Diagnosis not present

## 2014-12-01 LAB — CBC WITH DIFFERENTIAL/PLATELET
BASOS ABS: 0 10*3/uL (ref 0–0.1)
Basophils Relative: 1 %
EOS ABS: 0.1 10*3/uL (ref 0–0.7)
EOS PCT: 3 %
HCT: 30.7 % — ABNORMAL LOW (ref 40.0–52.0)
HEMOGLOBIN: 10.4 g/dL — AB (ref 13.0–18.0)
LYMPHS ABS: 0.6 10*3/uL — AB (ref 1.0–3.6)
Lymphocytes Relative: 19 %
MCH: 31.4 pg (ref 26.0–34.0)
MCHC: 33.9 g/dL (ref 32.0–36.0)
MCV: 92.6 fL (ref 80.0–100.0)
Monocytes Absolute: 0.5 10*3/uL (ref 0.2–1.0)
Monocytes Relative: 15 %
Neutro Abs: 2 10*3/uL (ref 1.4–6.5)
Neutrophils Relative %: 62 %
PLATELETS: 158 10*3/uL (ref 150–440)
RBC: 3.32 MIL/uL — ABNORMAL LOW (ref 4.40–5.90)
RDW: 14.5 % (ref 11.5–14.5)
WBC: 3.2 10*3/uL — ABNORMAL LOW (ref 3.8–10.6)

## 2014-12-01 LAB — BASIC METABOLIC PANEL
ANION GAP: 6 (ref 5–15)
BUN: 17 mg/dL (ref 6–20)
CHLORIDE: 101 mmol/L (ref 101–111)
CO2: 29 mmol/L (ref 22–32)
Calcium: 8.1 mg/dL — ABNORMAL LOW (ref 8.9–10.3)
Creatinine, Ser: 0.86 mg/dL (ref 0.61–1.24)
Glucose, Bld: 95 mg/dL (ref 65–99)
POTASSIUM: 3.9 mmol/L (ref 3.5–5.1)
SODIUM: 136 mmol/L (ref 135–145)

## 2014-12-01 MED ORDER — DIPHENHYDRAMINE HCL 50 MG/ML IJ SOLN
50.0000 mg | Freq: Once | INTRAMUSCULAR | Status: AC
  Administered 2014-12-01: 50 mg via INTRAVENOUS
  Filled 2014-12-01: qty 1

## 2014-12-01 MED ORDER — PACLITAXEL CHEMO INJECTION 300 MG/50ML
45.0000 mg/m2 | Freq: Once | INTRAVENOUS | Status: AC
  Administered 2014-12-01: 84 mg via INTRAVENOUS
  Filled 2014-12-01: qty 14

## 2014-12-01 MED ORDER — FAMOTIDINE IN NACL 20-0.9 MG/50ML-% IV SOLN
20.0000 mg | Freq: Once | INTRAVENOUS | Status: AC
  Administered 2014-12-01: 20 mg via INTRAVENOUS
  Filled 2014-12-01: qty 50

## 2014-12-01 MED ORDER — PROCHLORPERAZINE MALEATE 10 MG PO TABS
10.0000 mg | ORAL_TABLET | Freq: Four times a day (QID) | ORAL | Status: DC | PRN
  Administered 2014-12-01: 10 mg via ORAL
  Filled 2014-12-01: qty 1

## 2014-12-01 MED ORDER — SODIUM CHLORIDE 0.9 % IV SOLN
Freq: Once | INTRAVENOUS | Status: AC
  Administered 2014-12-01: 09:00:00 via INTRAVENOUS
  Filled 2014-12-01: qty 1000

## 2014-12-01 MED ORDER — SODIUM CHLORIDE 0.9 % IV SOLN
160.0000 mg | Freq: Once | INTRAVENOUS | Status: AC
  Administered 2014-12-01: 160 mg via INTRAVENOUS
  Filled 2014-12-01: qty 16

## 2014-12-01 MED ORDER — SODIUM CHLORIDE 0.9 % IV SOLN: Freq: Once | INTRAVENOUS | Status: DC

## 2014-12-01 MED ORDER — SODIUM CHLORIDE 0.9 % IV SOLN
20.0000 mg | Freq: Once | INTRAVENOUS | Status: AC
  Administered 2014-12-01: 20 mg via INTRAVENOUS
  Filled 2014-12-01: qty 2

## 2014-12-01 NOTE — Progress Notes (Signed)
Patient does have healthcare POA.  Former smoker.

## 2014-12-01 NOTE — Progress Notes (Signed)
[place] @ [facility] Telephone:(336) [phone]  Fax:(336) [place]: 25-Nov-1929  MR#: [id number]  JYN#:[id number]  Patient Care Team: Albina Billet, MD as PCP - General (Internal Medicine)  CHIEF COMPLAINT:  Chief Complaint  Patient presents with  . Follow-up    Oncology History   1.  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 Further staging workup with PET scan pending (diagnosis based on  CT scan ) July, 2016 2. SB RT for a T1 lesion of the left lung superior segment left upper lobe Biopsy was consistent with non-small cell carcinoma of lung in February of 2015 3.  Started radiation and chemotherapy for left upper lobe recurrent disease versus second primary from November 10, 2014     Carcinoma, lung   10/23/2014 Initial Diagnosis Carcinoma, lung    Lung cancer   10/24/2014 Initial Diagnosis Lung cancer    Oncology Flowsheet 10/24/2014 10/24/2014 10/24/2014 10/25/2014 11/10/2014 11/17/2014 11/24/2014  Day, Cycle - - - - Day 1, Cycle 1 Day 1, Cycle 2 Day 1, Cycle 3  CARBOplatin (PARAPLATIN) IV - - - - 160 mg 160 mg 160 mg  dexamethasone (DECADRON) IV - - - - [ 20 mg ] [ 20 mg ] [ 20 mg ]  enoxaparin (LOVENOX) [place]       - - - -  LORazepam (ATIVAN) PO 1 mg 0.5 mg 1 mg 1 mg - - -  ondansetron (ZOFRAN) IV - - - - [ 16 mg ] [ 16 mg ] [ 16 mg ]  PACLitaxel (TAXOL) IV - - - - 45 mg/m2 45 mg/m2 45 mg/m2    INTERVAL HISTORY: 81 year old gentleman was started on chemotherapy with platinum and Taxol starting from July of 2016.  Also starting radiation from 27th of July.  No nausea no vomiting no diarrhea appetite has been fairly good.  No cough or shortness of breath or chest pain in the left upper chest wall area Patient is not is here for second cycle of chemotherapy.  Started radiation treatment.  No nausea no vomiting no diarrhea.  No tingling numbness. November 24, 2014 Patient is here complaining of pain in the left shoulder has 5 hacking cough.  No hemoptysis.   Concurrent chemoradiation no tingling numbness the soreness in the mouth.  Alopecia. REVIEW OF SYSTEMS:   GENERAL:  Feeling somewhat weak and tired. PERFORMANCE STATUS (ECOG):01 HEENT:  No visual changes, runny nose, sore throat, mouth sores or tenderness. Lungs: Dry hacking cough.  No hemoptysis. Cardiac:  No chest pain, palpitations, orthopnea, or PND. GI:  No nausea, vomiting, diarrhea, constipation, melena or hematochezia. GU:  No urgency, frequency, dysuria, or hematuria. Musculoskeletal:  Left shoulder pain Extremities:  No pain or swelling. Skin:  No rashes or skin changes. Neuro:  No headache, numbness or weakness, balance or coordination issues. Endocrine:  No diabetes, thyroid issues, hot flashes or night sweats. Psych:  No mood changes, depression or anxiety. Pain:  No focal pain. Review of systems:  All other systems reviewed and found to be negative. As per HPI. Otherwise, a complete review of systems is negatve.  PAST MEDICAL HISTORY: Past Medical History  Diagnosis Date  . Major depressive disorder, recurrent, severe with psychotic features   . Persistent disorder of initiating or maintaining sleep   . Generalized anxiety disorder   . Major depressive disorder, recurrent, severe with psychotic features   . Persistent disorder of initiating or maintaining sleep   .  Asthma   . Cancer     Left lung  . COPD (chronic obstructive pulmonary disease)     PAST SURGICAL HISTORY: Past Surgical History  Procedure Laterality Date  . Hemorroidectomy    . Lung surgery      FAMILY HISTORY Family History  Problem Relation Age of Onset  . COPD Mother   . Lung cancer Father     ADVANCED DIRECTIVES:  Patient does have advance healthcare directive, Patient   does not desire to make any changes HEALTH MAINTENANCE: Social History  Substance Use Topics  . Smoking status: Former Research scientist (life sciences)  . Smokeless tobacco: Never Used     Comment: quit 18 years ago  . Alcohol Use: No       Allergies  Allergen Reactions  . Penicillins     Current Outpatient Prescriptions  Medication Sig Dispense Refill  . albuterol (PROVENTIL HFA;VENTOLIN HFA) 108 (90 BASE) MCG/ACT inhaler Inhale 1-2 puffs into the lungs QID.     Marland Kitchen atorvastatin (LIPITOR) 40 MG tablet Take 1 tablet by mouth daily.    Marland Kitchen azithromycin (ZITHROMAX) 250 MG tablet 1 tab po daily for 3 days 6 each 0  . cefUROXime (CEFTIN) 500 MG tablet Take 1 tablet (500 mg total) by mouth 2 (two) times daily with a meal. 10 tablet 0  . econazole nitrate 1 % cream Apply 1 application topically 2 (two) times daily.     Marland Kitchen esomeprazole (NEXIUM) 40 MG capsule Take 40 mg by mouth daily at 12 noon.    Marland Kitchen esomeprazole (NEXIUM) 40 MG packet Take 1 packet by mouth daily.    Marland Kitchen HYDROcodone-acetaminophen (NORCO/VICODIN) 5-325 MG per tablet Take 1 tablet by mouth every 4 (four) hours as needed for moderate pain. 30 tablet 0  . LORazepam (ATIVAN) 1 MG tablet Take 1 tablet 3 times a day and one half a tablet once a day. 315 tablet 0  . mometasone-formoterol (DULERA) 200-5 MCG/ACT AERO Inhale 2 puffs into the lungs 2 (two) times daily.     . ondansetron (ZOFRAN) 8 MG tablet Take 1 tablet (8 mg total) by mouth 2 (two) times daily. Start the day after chemo for 3 days. Then take as needed for nausea or vomiting. 30 tablet 1  . prochlorperazine (COMPAZINE) 10 MG tablet Take 1 tablet (10 mg total) by mouth every 6 (six) hours as needed (Nausea or vomiting). 30 tablet 1  . QUEtiapine (SEROQUEL XR) 300 MG 24 hr tablet Take 1 tablet (300 mg total) by mouth at bedtime. 90 tablet 0  . tamsulosin (FLOMAX) 0.4 MG CAPS capsule Take 1 capsule by mouth daily.    Marland Kitchen tiotropium (SPIRIVA) 18 MCG inhalation capsule Place into inhaler and inhale daily.    Marland Kitchen triamcinolone cream (KENALOG) 0.1 %     . venlafaxine XR (EFFEXOR-XR) 150 MG 24 hr capsule Take 1 capsule (150 mg total) by mouth daily. 90 capsule 0  . zolpidem (AMBIEN) 10 MG tablet Take 1 tablet (10 mg total)  by mouth at bedtime. 30 tablet 2   No current facility-administered medications for this visit.   Facility-Administered Medications Ordered in Other Visits  Medication Dose Route Frequency Provider Last Rate Last Dose  . 0.9 %  sodium chloride infusion   Intravenous Once Forest Gleason, MD      . CARBOplatin (PARAPLATIN) 160 mg in sodium chloride 0.9 % 100 mL chemo infusion  160 mg Intravenous Once Forest Gleason, MD      . diphenhydrAMINE (BENADRYL) injection 50 mg  50 mg Intravenous Once Forest Gleason, MD      . famotidine (PEPCID) IVPB 20 mg premix  20 mg Intravenous Once Forest Gleason, MD      . ondansetron (ZOFRAN) 16 mg, dexamethasone (DECADRON) 20 mg in sodium chloride 0.9 % 50 mL IVPB   Intravenous Once Forest Gleason, MD      . PACLitaxel (TAXOL) 84 mg in dextrose 5 % 250 mL chemo infusion (</= 19m/m2)  45 mg/m2 (Treatment Plan Actual) Intravenous Once JForest Gleason MD        OBJECTIVE:  Filed Vitals:   12/01/14 0838  BP: 177/90  Pulse: 94  Temp: 96.6 F (35.9 C)  Resp: 20     Body mass index is 23.96 kg/(m^2).    ECOG FS:1 - Symptomatic but completely ambulatory  PHYSICAL EXAM: GENERAL:  Well developed, well nourished, sitting comfortably in the exam room in no acute distress. MENTAL STATUS:  Alert and oriented to person, place and time. HEAD: Alopecia  Normocephalic, atraumatic, face symmetric, no Cushingoid features. EYES:  Pupils equal round and reactive to light and accomodation.  No conjunctivitis or scleral icterus. ENT:  Oropharynx clear without lesion.  Tongue normal. Mucous membranes moist.  RESPIRATORY:  Clear to auscultation without rales, wheezes or rhonchi. CARDIOVASCULAR:  Regular rate and rhythm without murmur, rub or gallop. BREAST:  Right breast without masses, skin changes or nipple discharge.  Left breast without masses, skin changes or nipple discharge. ABDOMEN:  Soft, non-tender, with active bowel sounds, and no hepatosplenomegaly.  No masses. BACK:  No CVA  tenderness.  No tenderness on percussion of the back or rib cage. SKIN:  No rashes, ulcers or lesions. EXTREMITIES: No edema, no skin discoloration or tenderness.  No palpable cords. LYMPH NODES: No palpable cervical, supraclavicular, axillary or inguinal adenopathy  NEUROLOGICAL: Unremarkable. PSYCH:  Appropriate.   LAB RESULTS:  Appointment on 12/01/2014  Component Date Value Ref Range Status  . WBC 12/01/2014 3.2* 3.8 - 10.6 K/uL Final  . RBC 12/01/2014 3.32* 4.40 - 5.90 MIL/uL Final  . Hemoglobin 12/01/2014 10.4* 13.0 - 18.0 g/dL Final  . HCT 12/01/2014 30.7* 40.0 - 52.0 % Final  . MCV 12/01/2014 92.6  80.0 - 100.0 fL Final  . MCH 12/01/2014 31.4  26.0 - 34.0 pg Final  . MCHC 12/01/2014 33.9  32.0 - 36.0 g/dL Final  . RDW 12/01/2014 14.5  11.5 - 14.5 % Final  . Platelets 12/01/2014 158  150 - 440 K/uL Final  . Neutrophils Relative % 12/01/2014 62   Final  . Neutro Abs 12/01/2014 2.0  1.4 - 6.5 K/uL Final  . Lymphocytes Relative 12/01/2014 19   Final  . Lymphs Abs 12/01/2014 0.6* 1.0 - 3.6 K/uL Final  . Monocytes Relative 12/01/2014 15   Final  . Monocytes Absolute 12/01/2014 0.5  0.2 - 1.0 K/uL Final  . Eosinophils Relative 12/01/2014 3   Final  . Eosinophils Absolute 12/01/2014 0.1  0 - 0.7 K/uL Final  . Basophils Relative 12/01/2014 1   Final  . Basophils Absolute 12/01/2014 0.0  0 - 0.1 K/uL Final  . Sodium 12/01/2014 136  135 - 145 mmol/L Final  . Potassium 12/01/2014 3.9  3.5 - 5.1 mmol/L Final  . Chloride 12/01/2014 101  101 - 111 mmol/L Final  . CO2 12/01/2014 29  22 - 32 mmol/L Final  . Glucose, Bld 12/01/2014 95  65 - 99 mg/dL Final  . BUN 12/01/2014 17  6 - 20 mg/dL Final  . Creatinine,  Ser 12/01/2014 0.86  0.61 - 1.24 mg/dL Final  . Calcium 12/01/2014 8.1* 8.9 - 10.3 mg/dL Final  . GFR calc non Af Amer 12/01/2014 >60  >60 mL/min Final  . GFR calc Af Amer 12/01/2014 >60  >60 mL/min Final   Comment: (NOTE) The eGFR has been calculated using the CKD EPI  equation. This calculation has not been validated in all clinical situations. eGFR's persistently <60 mL/min signify possible Chronic Kidney Disease.   . Anion gap 12/01/2014 6  5 - 15 Final      STUDIES: Guidance study., TP3,BRAF,JAK3,BRCA1 mutated in the blood testing (August, 2016) ASSESSMENT: Recurrent or another primary with left upper lobe invading vertebral body. He should not is getting concurrent chemoradiation therapy Carboplatinum Taxol weekly with radiation therapy Patient and number of questions which were answered. Left shoulder pain and Vicodin was prescribed.  Will continue chemotherapy all lab data has been reviewed.  Patient expressed understanding and was in agreement with this plan. He also understands that He can call clinic at any time with any questions, concerns, or complaints.    Carcinoma, lung   Staging form: Lung, AJCC 7th Edition     Clinical: Stage IIIA (T4, N0, M0) - Signed by Forest Gleason, MD on 10/29/2014   Forest Gleason, MD   12/01/2014 9:03 AM

## 2014-12-01 NOTE — Progress Notes (Signed)
[place] @ [facility] [phone]) [phone]  Fax:(336) [place]: [birth year]  MR#: [id number]  BWG#:[id number]  Patient Care Team: Albina Billet, MD as PCP - General (Internal Medicine)  CHIEF COMPLAINT:  Chief Complaint  Patient presents with  . Follow-up    Oncology History   1.  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 Further staging workup with PET scan pending (diagnosis based on  CT scan ) July, 2016 2. SB RT for a T1 lesion of the left lung superior segment left upper lobe Biopsy was consistent with non-small cell carcinoma of lung in February of 2015 3.  Started radiation and chemotherapy for left upper lobe recurrent disease versus second primary from November 10, 2014     Carcinoma, lung   10/23/2014 Initial Diagnosis Carcinoma, lung    Lung cancer   10/24/2014 Initial Diagnosis Lung cancer    Oncology Flowsheet 10/24/2014 10/24/2014 10/24/2014 10/25/2014 11/10/2014 11/17/2014 11/24/2014  Day, Cycle - - - - Day 1, Cycle 1 Day 1, Cycle 2 Day 1, Cycle 3  CARBOplatin (PARAPLATIN) IV - - - - 160 mg 160 mg 160 mg  dexamethasone (DECADRON) IV - - - - [ 20 mg ] [ 20 mg ] [ 20 mg ]  enoxaparin (LOVENOX) [place]       - - - -  LORazepam (ATIVAN) PO 1 mg 0.5 mg 1 mg 1 mg - - -  ondansetron (ZOFRAN) IV - - - - [ 16 mg ] [ 16 mg ] [ 16 mg ]  PACLitaxel (TAXOL) IV - - - - 45 mg/m2 45 mg/m2 45 mg/m2    INTERVAL HISTORY: Patient is here for ongoing evaluation regarding non-small cell carcinoma of lung in next cycle of chemotherapy.  Getting radiation therapy.  His last radiation therapies on September 8.  No chills.  No fever.  Patient is slightly apprehensive his blood pressure is slightly higher. No stomatitis.  No tingling.  No numbness. REVIEW OF SYSTEMS:   general status: Patient is feeling weak and tired.  No change in a performance status.  No chills.  No fever. HEENT: Alopecia.  No evidence of stomatitis Lungs: No cough or shortness of breath Cardiac:  No chest pain or paroxysmal nocturnal dyspnea GI: No nausea no vomiting no diarrhea no abdominal pain Skin: No rash Lower extremity no swelling Neurological system: No tingling.  No numbness.  No other focal signs Musculoskeletal system no bony pains  As per HPI. Otherwise, a complete review of systems is negatve.  PAST MEDICAL HISTORY: Past Medical History  Diagnosis Date  . Major depressive disorder, recurrent, severe with psychotic features   . Persistent disorder of initiating or maintaining sleep   . Generalized anxiety disorder   . Major depressive disorder, recurrent, severe with psychotic features   . Persistent disorder of initiating or maintaining sleep   . Asthma   . Cancer     Left lung  . COPD (chronic obstructive pulmonary disease)     PAST SURGICAL HISTORY: Past Surgical History  Procedure Laterality Date  . Hemorroidectomy    . Lung surgery      FAMILY HISTORY Family History  Problem Relation Age of Onset  . COPD Mother   . Lung cancer Father     ADVANCED DIRECTIVES:  Patient does have advance healthcare directive, Patient   does not desire to make any changes HEALTH MAINTENANCE: Social History  Substance Use Topics  .  Smoking status: Former Research scientist (life sciences)  . Smokeless tobacco: Never Used     Comment: quit 18 years ago  . Alcohol Use: No      Allergies  Allergen Reactions  . Penicillins     Current Outpatient Prescriptions  Medication Sig Dispense Refill  . albuterol (PROVENTIL HFA;VENTOLIN HFA) 108 (90 BASE) MCG/ACT inhaler Inhale 1-2 puffs into the lungs QID.     Marland Kitchen atorvastatin (LIPITOR) 40 MG tablet Take 1 tablet by mouth daily.    Marland Kitchen azithromycin (ZITHROMAX) 250 MG tablet 1 tab po daily for 3 days 6 each 0  . cefUROXime (CEFTIN) 500 MG tablet Take 1 tablet (500 mg total) by mouth 2 (two) times daily with a meal. 10 tablet 0  . econazole nitrate 1 % cream Apply 1 application topically 2 (two) times daily.     Marland Kitchen esomeprazole (NEXIUM) 40 MG capsule  Take 40 mg by mouth daily at 12 noon.    Marland Kitchen esomeprazole (NEXIUM) 40 MG packet Take 1 packet by mouth daily.    Marland Kitchen HYDROcodone-acetaminophen (NORCO/VICODIN) 5-325 MG per tablet Take 1 tablet by mouth every 4 (four) hours as needed for moderate pain. 30 tablet 0  . LORazepam (ATIVAN) 1 MG tablet Take 1 tablet 3 times a day and one half a tablet once a day. 315 tablet 0  . mometasone-formoterol (DULERA) 200-5 MCG/ACT AERO Inhale 2 puffs into the lungs 2 (two) times daily.     . ondansetron (ZOFRAN) 8 MG tablet Take 1 tablet (8 mg total) by mouth 2 (two) times daily. Start the day after chemo for 3 days. Then take as needed for nausea or vomiting. 30 tablet 1  . prochlorperazine (COMPAZINE) 10 MG tablet Take 1 tablet (10 mg total) by mouth every 6 (six) hours as needed (Nausea or vomiting). 30 tablet 1  . QUEtiapine (SEROQUEL XR) 300 MG 24 hr tablet Take 1 tablet (300 mg total) by mouth at bedtime. 90 tablet 0  . tamsulosin (FLOMAX) 0.4 MG CAPS capsule Take 1 capsule by mouth daily.    Marland Kitchen tiotropium (SPIRIVA) 18 MCG inhalation capsule Place into inhaler and inhale daily.    Marland Kitchen triamcinolone cream (KENALOG) 0.1 %     . venlafaxine XR (EFFEXOR-XR) 150 MG 24 hr capsule Take 1 capsule (150 mg total) by mouth daily. 90 capsule 0  . zolpidem (AMBIEN) 10 MG tablet Take 1 tablet (10 mg total) by mouth at bedtime. 30 tablet 2   No current facility-administered medications for this visit.    OBJECTIVE:  Filed Vitals:   12/01/14 0838  BP: 177/90  Pulse: 94  Temp: 96.6 F (35.9 C)  Resp: 20     Body mass index is 23.96 kg/(m^2).    ECOG FS:1 - Symptomatic but completely ambulatory  PHYSICAL EXAM: General  status: Performance status is good.  Patient has not lost significant weight HEENT: No evidence of stomatitis. Sclera and conjunctivae :: No jaundice.   pale looking. Lungs: Air  entry equal on both sides.  No rhonchi.  No rales.  Cardiac: Heart sounds are normal.  No pericardial rub.  No  murmur. Lymphatic system: Cervical, axillary, inguinal, lymph nodes not palpable GI: Abdomen is soft.  No ascites.  Liver spleen not palpable.  No tenderness.  Bowel sounds are within normal limit Lower extremity: No edema Neurological system: Higher functions, cranial nerves intact no evidence of peripheral neuropathy. Skin: No rash.  No ecchymosis.Marland Kitchen  LAB RESULTS:  CBC Latest Ref Rng 12/01/2014 11/24/2014  WBC  3.8 - 10.6 K/uL 3.2(L) 3.3(L)  Hemoglobin 13.0 - 18.0 g/dL 10.4(L) 11.5(L)  Hematocrit 40.0 - 52.0 % 30.7(L) 34.2(L)  Platelets 150 - 440 K/uL 158 181    Appointment on 12/01/2014  Component Date Value Ref Range Status  . WBC 12/01/2014 3.2* 3.8 - 10.6 K/uL Final  . RBC 12/01/2014 3.32* 4.40 - 5.90 MIL/uL Final  . Hemoglobin 12/01/2014 10.4* 13.0 - 18.0 g/dL Final  . HCT 12/01/2014 30.7* 40.0 - 52.0 % Final  . MCV 12/01/2014 92.6  80.0 - 100.0 fL Final  . MCH 12/01/2014 31.4  26.0 - 34.0 pg Final  . MCHC 12/01/2014 33.9  32.0 - 36.0 g/dL Final  . RDW 12/01/2014 14.5  11.5 - 14.5 % Final  . Platelets 12/01/2014 158  150 - 440 K/uL Final  . Neutrophils Relative % 12/01/2014 62   Final  . Neutro Abs 12/01/2014 2.0  1.4 - 6.5 K/uL Final  . Lymphocytes Relative 12/01/2014 19   Final  . Lymphs Abs 12/01/2014 0.6* 1.0 - 3.6 K/uL Final  . Monocytes Relative 12/01/2014 15   Final  . Monocytes Absolute 12/01/2014 0.5  0.2 - 1.0 K/uL Final  . Eosinophils Relative 12/01/2014 3   Final  . Eosinophils Absolute 12/01/2014 0.1  0 - 0.7 K/uL Final  . Basophils Relative 12/01/2014 1   Final  . Basophils Absolute 12/01/2014 0.0  0 - 0.1 K/uL Final        ASSESSMENT: Non-small cell carcinoma of lung stage IIIB second primary versus metastases. Continue chemotherapy cycle 4. Patient is getting radiation therapy last radiation therapies on September 9 Blood pressure is slightly higher will be monitored  MEDICAL DECISION MAKING:  All lab data has been reviewed.  Patient will get cycle  4 and fifth and be reevaluated at the time of last cycle of chemotherapy  Patient expressed understanding and was in agreement with this plan. He also understands that He can call clinic at any time with any questions, concerns, or complaints.    Carcinoma, lung   Staging form: Lung, AJCC 7th Edition     Clinical: Stage IIIA (T4, N0, M0) - Signed by Forest Gleason, MD on 10/29/2014   Forest Gleason, MD   12/01/2014 8:52 AM

## 2014-12-02 ENCOUNTER — Ambulatory Visit
Admission: RE | Admit: 2014-12-02 | Discharge: 2014-12-02 | Disposition: A | Discharge status: Home / Self Care | Payer: Medicare HMO | Source: Ambulatory Visit | Attending: Radiation Oncology | Admitting: Radiation Oncology

## 2014-12-02 DIAGNOSIS — Z510 Encounter for antineoplastic radiation therapy: Secondary | ICD-10-CM | POA: Diagnosis not present

## 2014-12-03 ENCOUNTER — Ambulatory Visit
Admission: RE | Admit: 2014-12-03 | Discharge: 2014-12-03 | Disposition: A | Discharge status: Home / Self Care | Payer: Medicare HMO | Source: Ambulatory Visit | Attending: Radiation Oncology | Admitting: Radiation Oncology

## 2014-12-03 DIAGNOSIS — Z510 Encounter for antineoplastic radiation therapy: Secondary | ICD-10-CM | POA: Diagnosis not present

## 2014-12-04 ENCOUNTER — Ambulatory Visit
Admission: RE | Admit: 2014-12-04 | Discharge: 2014-12-04 | Disposition: A | Discharge status: Home / Self Care | Payer: Medicare HMO | Source: Ambulatory Visit | Attending: Radiation Oncology | Admitting: Radiation Oncology

## 2014-12-04 DIAGNOSIS — Z510 Encounter for antineoplastic radiation therapy: Secondary | ICD-10-CM | POA: Diagnosis not present

## 2014-12-05 ENCOUNTER — Ambulatory Visit
Admission: RE | Admit: 2014-12-05 | Discharge: 2014-12-05 | Disposition: A | Discharge status: Home / Self Care | Payer: Medicare HMO | Source: Ambulatory Visit | Attending: Radiation Oncology | Admitting: Radiation Oncology

## 2014-12-05 DIAGNOSIS — Z510 Encounter for antineoplastic radiation therapy: Secondary | ICD-10-CM | POA: Diagnosis not present

## 2014-12-08 ENCOUNTER — Ambulatory Visit
Admission: RE | Admit: 2014-12-08 | Discharge: 2014-12-08 | Disposition: A | Discharge status: Home / Self Care | Payer: Medicare HMO | Source: Ambulatory Visit | Attending: Radiation Oncology | Admitting: Radiation Oncology

## 2014-12-08 ENCOUNTER — Inpatient Hospital Stay: Payer: Medicare HMO

## 2014-12-08 VITALS — BP 158/70 | HR 89 | Temp 96.2°F

## 2014-12-08 DIAGNOSIS — C3492 Malignant neoplasm of unspecified part of left bronchus or lung: Secondary | ICD-10-CM

## 2014-12-08 DIAGNOSIS — Z510 Encounter for antineoplastic radiation therapy: Secondary | ICD-10-CM | POA: Diagnosis not present

## 2014-12-08 DIAGNOSIS — Z5111 Encounter for antineoplastic chemotherapy: Secondary | ICD-10-CM | POA: Diagnosis not present

## 2014-12-08 LAB — CBC WITH DIFFERENTIAL/PLATELET
Basophils Absolute: 0 10*3/uL (ref 0–0.1)
Basophils Relative: 1 %
EOS PCT: 2 %
Eosinophils Absolute: 0.1 10*3/uL (ref 0–0.7)
HCT: 28.6 % — ABNORMAL LOW (ref 40.0–52.0)
Hemoglobin: 9.8 g/dL — ABNORMAL LOW (ref 13.0–18.0)
LYMPHS ABS: 0.5 10*3/uL — AB (ref 1.0–3.6)
LYMPHS PCT: 15 %
MCH: 31.4 pg (ref 26.0–34.0)
MCHC: 34.1 g/dL (ref 32.0–36.0)
MCV: 91.9 fL (ref 80.0–100.0)
MONO ABS: 0.5 10*3/uL (ref 0.2–1.0)
Monocytes Relative: 15 %
Neutro Abs: 2.4 10*3/uL (ref 1.4–6.5)
Neutrophils Relative %: 67 %
PLATELETS: 136 10*3/uL — AB (ref 150–440)
RBC: 3.12 MIL/uL — ABNORMAL LOW (ref 4.40–5.90)
RDW: 14.3 % (ref 11.5–14.5)
WBC: 3.5 10*3/uL — ABNORMAL LOW (ref 3.8–10.6)

## 2014-12-08 LAB — BASIC METABOLIC PANEL
ANION GAP: 7 (ref 5–15)
BUN: 18 mg/dL (ref 6–20)
CO2: 28 mmol/L (ref 22–32)
Calcium: 8 mg/dL — ABNORMAL LOW (ref 8.9–10.3)
Chloride: 101 mmol/L (ref 101–111)
Creatinine, Ser: 0.78 mg/dL (ref 0.61–1.24)
GFR calc Af Amer: >60 mL/min (ref >60)
GFR calc non Af Amer: >60 mL/min (ref >60)
GLUCOSE: 100 mg/dL — AB (ref 65–99)
POTASSIUM: 3.2 mmol/L — AB (ref 3.5–5.1)
Sodium: 136 mmol/L (ref 135–145)

## 2014-12-08 MED ORDER — FAMOTIDINE IN NACL 20-0.9 MG/50ML-% IV SOLN
20.0000 mg | Freq: Once | INTRAVENOUS | Status: AC
  Administered 2014-12-08: 20 mg via INTRAVENOUS
  Filled 2014-12-08: qty 50

## 2014-12-08 MED ORDER — SODIUM CHLORIDE 0.9 % IV SOLN
20.0000 mg | Freq: Once | INTRAVENOUS | Status: AC
  Administered 2014-12-08: 20 mg via INTRAVENOUS
  Filled 2014-12-08: qty 2

## 2014-12-08 MED ORDER — PROCHLORPERAZINE MALEATE 10 MG PO TABS
10.0000 mg | ORAL_TABLET | Freq: Four times a day (QID) | ORAL | Status: DC | PRN
  Administered 2014-12-08: 10 mg via ORAL
  Filled 2014-12-08: qty 1

## 2014-12-08 MED ORDER — SODIUM CHLORIDE 0.9 % IV SOLN
160.0000 mg | Freq: Once | INTRAVENOUS | Status: AC
  Administered 2014-12-08: 160 mg via INTRAVENOUS
  Filled 2014-12-08: qty 16

## 2014-12-08 MED ORDER — DIPHENHYDRAMINE HCL 50 MG/ML IJ SOLN
50.0000 mg | Freq: Once | INTRAMUSCULAR | Status: AC
  Administered 2014-12-08: 50 mg via INTRAVENOUS
  Filled 2014-12-08: qty 1

## 2014-12-08 MED ORDER — SODIUM CHLORIDE 0.9 % IV SOLN
Freq: Once | INTRAVENOUS | Status: AC
  Administered 2014-12-08: 10:00:00 via INTRAVENOUS
  Filled 2014-12-08: qty 1000

## 2014-12-08 MED ORDER — DEXTROSE 5 % IV SOLN
45.0000 mg/m2 | Freq: Once | INTRAVENOUS | Status: AC
  Administered 2014-12-08: 84 mg via INTRAVENOUS
  Filled 2014-12-08: qty 14

## 2014-12-08 MED ORDER — SODIUM CHLORIDE 0.9 % IJ SOLN
10.0000 mL | INTRAMUSCULAR | Status: DC | PRN
  Filled 2014-12-08: qty 10

## 2014-12-09 ENCOUNTER — Other Ambulatory Visit: Payer: Self-pay | Admitting: Psychiatry

## 2014-12-09 ENCOUNTER — Ambulatory Visit
Admission: RE | Admit: 2014-12-09 | Discharge: 2014-12-09 | Disposition: A | Discharge status: Home / Self Care | Payer: Medicare HMO | Source: Ambulatory Visit | Attending: Radiation Oncology | Admitting: Radiation Oncology

## 2014-12-09 DIAGNOSIS — Z510 Encounter for antineoplastic radiation therapy: Secondary | ICD-10-CM | POA: Diagnosis not present

## 2014-12-09 NOTE — Telephone Encounter (Signed)
rx faxed- confirmed received

## 2014-12-09 NOTE — Telephone Encounter (Signed)
rx faxed and recieved a confirmation

## 2014-12-10 ENCOUNTER — Ambulatory Visit
Admission: RE | Admit: 2014-12-10 | Discharge: 2014-12-10 | Disposition: A | Discharge status: Home / Self Care | Payer: Medicare HMO | Source: Ambulatory Visit | Attending: Radiation Oncology | Admitting: Radiation Oncology

## 2014-12-10 DIAGNOSIS — Z510 Encounter for antineoplastic radiation therapy: Secondary | ICD-10-CM | POA: Diagnosis not present

## 2014-12-11 ENCOUNTER — Ambulatory Visit
Admission: RE | Admit: 2014-12-11 | Discharge: 2014-12-11 | Disposition: A | Discharge status: Home / Self Care | Payer: Medicare HMO | Source: Ambulatory Visit | Attending: Radiation Oncology | Admitting: Radiation Oncology

## 2014-12-11 ENCOUNTER — Telehealth: Payer: Self-pay | Admitting: *Deleted

## 2014-12-11 DIAGNOSIS — C3490 Malignant neoplasm of unspecified part of unspecified bronchus or lung: Secondary | ICD-10-CM

## 2014-12-11 DIAGNOSIS — Z510 Encounter for antineoplastic radiation therapy: Secondary | ICD-10-CM | POA: Diagnosis not present

## 2014-12-11 MED ORDER — PREDNISONE 10 MG PO TABS: ORAL_TABLET | ORAL | Status: DC

## 2014-12-11 NOTE — Telephone Encounter (Signed)
Called patient to inform him that prednisone has been called for his appetite.  Instructed him to take it early in the day to prevent disturbing his sleep.  Verbalized understanding.

## 2014-12-11 NOTE — Telephone Encounter (Signed)
Per MD, to start prednisone daily to stimulate appetite.

## 2014-12-12 ENCOUNTER — Ambulatory Visit
Admission: RE | Admit: 2014-12-12 | Discharge: 2014-12-12 | Disposition: A | Discharge status: Home / Self Care | Payer: Medicare HMO | Source: Ambulatory Visit | Attending: Radiation Oncology | Admitting: Radiation Oncology

## 2014-12-12 DIAGNOSIS — Z510 Encounter for antineoplastic radiation therapy: Secondary | ICD-10-CM | POA: Diagnosis not present

## 2014-12-15 ENCOUNTER — Ambulatory Visit
Admission: RE | Admit: 2014-12-15 | Discharge: 2014-12-15 | Disposition: A | Discharge status: Home / Self Care | Payer: Medicare HMO | Source: Ambulatory Visit | Attending: Radiation Oncology | Admitting: Radiation Oncology

## 2014-12-15 ENCOUNTER — Inpatient Hospital Stay: Payer: Medicare HMO

## 2014-12-15 ENCOUNTER — Inpatient Hospital Stay (HOSPITAL_BASED_OUTPATIENT_CLINIC_OR_DEPARTMENT_OTHER): Payer: Medicare HMO | Admitting: Oncology

## 2014-12-15 VITALS — BP 126/61 | HR 108 | Temp 96.4°F | Wt 152.2 lb

## 2014-12-15 DIAGNOSIS — C3492 Malignant neoplasm of unspecified part of left bronchus or lung: Secondary | ICD-10-CM

## 2014-12-15 DIAGNOSIS — Z5111 Encounter for antineoplastic chemotherapy: Secondary | ICD-10-CM | POA: Diagnosis not present

## 2014-12-15 DIAGNOSIS — C7951 Secondary malignant neoplasm of bone: Secondary | ICD-10-CM

## 2014-12-15 DIAGNOSIS — Z79899 Other long term (current) drug therapy: Secondary | ICD-10-CM

## 2014-12-15 DIAGNOSIS — C3412 Malignant neoplasm of upper lobe, left bronchus or lung: Secondary | ICD-10-CM | POA: Diagnosis not present

## 2014-12-15 DIAGNOSIS — Z510 Encounter for antineoplastic radiation therapy: Secondary | ICD-10-CM | POA: Diagnosis not present

## 2014-12-15 DIAGNOSIS — J449 Chronic obstructive pulmonary disease, unspecified: Secondary | ICD-10-CM | POA: Diagnosis not present

## 2014-12-15 LAB — CBC WITH DIFFERENTIAL/PLATELET
BASOS PCT: 0 %
Basophils Absolute: 0 10*3/uL (ref 0–0.1)
EOS PCT: 0 %
Eosinophils Absolute: 0 10*3/uL (ref 0–0.7)
HEMATOCRIT: 27.3 % — AB (ref 40.0–52.0)
Hemoglobin: 9.3 g/dL — ABNORMAL LOW (ref 13.0–18.0)
Lymphocytes Relative: 8 %
Lymphs Abs: 0.3 10*3/uL — ABNORMAL LOW (ref 1.0–3.6)
MCH: 31.4 pg (ref 26.0–34.0)
MCHC: 33.9 g/dL (ref 32.0–36.0)
MCV: 92.7 fL (ref 80.0–100.0)
MONO ABS: 0.3 10*3/uL (ref 0.2–1.0)
MONOS PCT: 9 %
NEUTROS ABS: 2.6 10*3/uL (ref 1.4–6.5)
Neutrophils Relative %: 83 %
PLATELETS: 151 10*3/uL (ref 150–440)
RBC: 2.95 MIL/uL — ABNORMAL LOW (ref 4.40–5.90)
RDW: 14.4 % (ref 11.5–14.5)
WBC: 3.2 10*3/uL — ABNORMAL LOW (ref 3.8–10.6)

## 2014-12-15 LAB — BASIC METABOLIC PANEL
Anion gap: 4 — ABNORMAL LOW (ref 5–15)
BUN: 15 mg/dL (ref 6–20)
CALCIUM: 7.9 mg/dL — AB (ref 8.9–10.3)
CO2: 27 mmol/L (ref 22–32)
CREATININE: 0.77 mg/dL (ref 0.61–1.24)
Chloride: 104 mmol/L (ref 101–111)
GFR calc non Af Amer: >60 mL/min (ref >60)
GLUCOSE: 155 mg/dL — AB (ref 65–99)
Potassium: 4.1 mmol/L (ref 3.5–5.1)
Sodium: 135 mmol/L (ref 135–145)

## 2014-12-15 MED ORDER — SODIUM CHLORIDE 0.9 % IV SOLN
160.0000 mg | Freq: Once | INTRAVENOUS | Status: AC
  Administered 2014-12-15: 160 mg via INTRAVENOUS
  Filled 2014-12-15: qty 16

## 2014-12-15 MED ORDER — FAMOTIDINE IN NACL 20-0.9 MG/50ML-% IV SOLN
20.0000 mg | Freq: Once | INTRAVENOUS | Status: AC
  Administered 2014-12-15: 20 mg via INTRAVENOUS
  Filled 2014-12-15: qty 50

## 2014-12-15 MED ORDER — SODIUM CHLORIDE 0.9 % IV SOLN
Freq: Once | INTRAVENOUS | Status: AC
  Administered 2014-12-15: 11:00:00 via INTRAVENOUS
  Filled 2014-12-15: qty 1000

## 2014-12-15 MED ORDER — DEXTROSE 5 % IV SOLN
45.0000 mg/m2 | Freq: Once | INTRAVENOUS | Status: AC
  Administered 2014-12-15: 84 mg via INTRAVENOUS
  Filled 2014-12-15: qty 14

## 2014-12-15 MED ORDER — DIPHENHYDRAMINE HCL 50 MG/ML IJ SOLN
50.0000 mg | Freq: Once | INTRAMUSCULAR | Status: AC
  Administered 2014-12-15: 50 mg via INTRAVENOUS
  Filled 2014-12-15: qty 1

## 2014-12-15 MED ORDER — SODIUM CHLORIDE 0.9 % IV SOLN
Freq: Once | INTRAVENOUS | Status: AC
  Administered 2014-12-15: 11:00:00 via INTRAVENOUS
  Filled 2014-12-15: qty 4

## 2014-12-15 NOTE — Progress Notes (Signed)
Patient does have living will.  Former smoker.

## 2014-12-16 ENCOUNTER — Encounter: Payer: Self-pay | Admitting: Oncology

## 2014-12-16 ENCOUNTER — Telehealth: Payer: Self-pay | Admitting: *Deleted

## 2014-12-16 ENCOUNTER — Ambulatory Visit: Payer: Medicare HMO

## 2014-12-16 NOTE — Telephone Encounter (Signed)
Called to home by sister due to weakness and almost falling. They evaluated him and feel he is dehydrated asking if he should come her or go to ER. Per Dr Oliva Bustard patient to be brought here. When I called Lattie Haw back, she stated patient refuses to go anywhere. I told her that if they get called back to bring him here to office. She said the sisters are going to try to get him in the car and bring him here if possible

## 2014-12-16 NOTE — Progress Notes (Signed)
[place] @ [facility] Telephone:([phone]) [phone]  Fax:(336) [place]: 06-16-1929  MR#: [id number]  DGU#:[id number]  Patient Care Team: Albina Billet, MD as PCP - General (Internal Medicine)  CHIEF COMPLAINT:  Chief Complaint  Patient presents with  . Follow-up    Oncology History   1.  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 Further staging workup with PET scan pending (diagnosis based on  CT scan ) July, 2016 2. SB RT for a T1 lesion of the left lung superior segment left upper lobe Biopsy was consistent with non-small cell carcinoma of lung in February of 2015 3.  Started radiation and chemotherapy for left upper lobe recurrent disease versus second primary from November 10, 2014   4.  Patient has finished total 6 weekly cycle of carboplatinum and Taxol on December 15, 2014 Will   Be finished   With radiation therapy by December 21, 2014     Oncology Flowsheet 10/25/2014 11/10/2014 11/17/2014 11/24/2014 12/01/2014 12/08/2014 12/15/2014  Day, Cycle - Day 1, Cycle 1 Day 1, Cycle 2 Day 1, Cycle 3 Day 1, Cycle 4 Day 1, 5 Day 1, 5  CARBOplatin (PARAPLATIN) IV - 160 mg 160 mg 160 mg 160 mg 160 mg 160 mg  dexamethasone (DECADRON) IV - [ 20 mg ] [ 20 mg ] [ 20 mg ] 20 mg 20 mg -  enoxaparin (LOVENOX)  - - - - - - -  LORazepam (ATIVAN) PO 1 mg - - - - - -  ondansetron (ZOFRAN) IV - [ 16 mg ] [ 16 mg ] [ 16 mg ] - - [ 8 mg ]  PACLitaxel (TAXOL) IV - 45 mg/m2 45 mg/m2 45 mg/m2 45 mg/m2 45 mg/m2 45 mg/m2  prochlorperazine (COMPAZINE) PO - - - - 10 mg 10 mg -    INTERVAL HISTORY: 81 year old gentleman was started on chemotherapy with platinum and Taxol starting from July of 2016.  Also starting radiation from 27th of July.  No nausea no vomiting no diarrhea appetite has been fairly good.  No cough or shortness of breath or chest pain in the left upper chest wall area Patient is not is here for second cycle of chemotherapy.  Started radiation treatment.  No nausea  no vomiting no diarrhea.  No tingling numbness. November 24, 2014 Patient is here complaining of pain in the left shoulder has 5 hacking cough.  No hemoptysis.  Concurrent chemoradiation no tingling numbness the soreness in the mouth.  Alopecia. December 15, 2014 Patient is here for ongoing evaluation and treatment consideration.  No chills.  No fever.  No nausea.  No diarrhea.  Complains of dry hacking cough.  Patient is finishing up radiation therapy in first week of September no tingling numbness. REVIEW OF SYSTEMS:   GENERAL:  Feeling somewhat weak and tired. PERFORMANCE STATUS (ECOG):01 HEENT:  No visual changes, runny nose, sore throat, mouth sores or tenderness. Lungs: Dry hacking cough.  No hemoptysis. Cardiac:  No chest pain, palpitations, orthopnea, or PND. GI:  No nausea, vomiting, diarrhea, constipation, melena or hematochezia. GU:  No urgency, frequency, dysuria, or hematuria. Musculoskeletal:  Left shoulder pain Extremities:  No pain or swelling. Skin:  No rashes or skin changes. Neuro:  No headache, numbness or weakness, balance or coordination issues. Endocrine:  No diabetes, thyroid issues, hot flashes or night sweats. Psych:  No mood changes, depression or anxiety. Pain:  No focal pain. Review of systems:  All other systems reviewed and found to be negative. As per HPI. Otherwise, a complete review of systems is negatve.  PAST MEDICAL HISTORY: Past Medical History  Diagnosis Date  . Major depressive disorder, recurrent, severe with psychotic features   . Persistent disorder of initiating or maintaining sleep   . Generalized anxiety disorder   . Major depressive disorder, recurrent, severe with psychotic features   . Persistent disorder of initiating or maintaining sleep   . Asthma   . Cancer     Left lung  . COPD (chronic obstructive pulmonary disease)     PAST SURGICAL HISTORY: Past Surgical History  Procedure Laterality Date  . Hemorroidectomy    . Lung surgery       FAMILY HISTORY Family History  Problem Relation Age of Onset  . COPD Mother   . Lung cancer Father     ADVANCED DIRECTIVES:  Patient does have advance healthcare directive, Patient   does not desire to make any changes HEALTH MAINTENANCE: Social History  Substance Use Topics  . Smoking status: Former Research scientist (life sciences)  . Smokeless tobacco: Never Used     Comment: quit 18 years ago  . Alcohol Use: No      Allergies  Allergen Reactions  . Penicillins     Current Outpatient Prescriptions  Medication Sig Dispense Refill  . albuterol (PROVENTIL HFA;VENTOLIN HFA) 108 (90 BASE) MCG/ACT inhaler Inhale 1-2 puffs into the lungs QID.     Marland Kitchen atorvastatin (LIPITOR) 40 MG tablet Take 1 tablet by mouth daily.    Marland Kitchen azithromycin (ZITHROMAX) 250 MG tablet 1 tab po daily for 3 days 6 each 0  . econazole nitrate 1 % cream Apply 1 application topically 2 (two) times daily.     Marland Kitchen esomeprazole (NEXIUM) 40 MG capsule Take 40 mg by mouth daily at 12 noon.    Marland Kitchen esomeprazole (NEXIUM) 40 MG packet Take 1 packet by mouth daily.    Marland Kitchen HYDROcodone-acetaminophen (NORCO/VICODIN) 5-325 MG per tablet Take 1 tablet by mouth every 4 (four) hours as needed for moderate pain. 30 tablet 0  . LORazepam (ATIVAN) 1 MG tablet Take 1 tablet 3 times a day and one half a tablet once a day. 315 tablet 0  . mometasone-formoterol (DULERA) 200-5 MCG/ACT AERO Inhale 2 puffs into the lungs 2 (two) times daily.     . ondansetron (ZOFRAN) 8 MG tablet Take 1 tablet (8 mg total) by mouth 2 (two) times daily. Start the day after chemo for 3 days. Then take as needed for nausea or vomiting. 30 tablet 1  . predniSONE (DELTASONE) 10 MG tablet Take 2 tabs orally once a day for 7 days, then take 1 tab orally once a day for 7 days 21 tablet 0  . prochlorperazine (COMPAZINE) 10 MG tablet Take 1 tablet (10 mg total) by mouth every 6 (six) hours as needed (Nausea or vomiting). 30 tablet 1  . QUEtiapine (SEROQUEL XR) 300 MG 24 hr tablet Take 1 tablet  (300 mg total) by mouth at bedtime. 90 tablet 0  . tamsulosin (FLOMAX) 0.4 MG CAPS capsule Take 1 capsule by mouth daily.    Marland Kitchen tiotropium (SPIRIVA) 18 MCG inhalation capsule Place into inhaler and inhale daily.    Marland Kitchen triamcinolone cream (KENALOG) 0.1 %     . venlafaxine XR (EFFEXOR-XR) 150 MG 24 hr capsule Take 1 capsule (150 mg total) by mouth daily. 90 capsule 0  . zolpidem (AMBIEN) 10 MG tablet TAKE ONE TABLET AT BEDTIME 30 tablet  1  . cefUROXime (CEFTIN) 500 MG tablet Take 1 tablet (500 mg total) by mouth 2 (two) times daily with a meal. (Patient not taking: Reported on 12/15/2014) 10 tablet 0   No current facility-administered medications for this visit.    OBJECTIVE:  Filed Vitals:   12/15/14 0902  BP: 126/61  Pulse: 108  Temp: 96.4 F (35.8 C)     Body mass index is 23.48 kg/(m^2).    ECOG FS:1 - Symptomatic but completely ambulatory  PHYSICAL EXAM: GENERAL:  Well developed, well nourished, sitting comfortably in the exam room in no acute distress. MENTAL STATUS:  Alert and oriented to person, place and time. HEAD: Alopecia  Normocephalic, atraumatic, face symmetric, no Cushingoid features. EYES:  Pupils equal round and reactive to light and accomodation.  No conjunctivitis or scleral icterus. ENT:  Oropharynx clear without lesion.  Tongue normal. Mucous membranes moist.  RESPIRATORY:  Clear to auscultation without rales, wheezes or rhonchi. CARDIOVASCULAR:  Regular rate and rhythm without murmur, rub or gallop. BREAST:  Right breast without masses, skin changes or nipple discharge.  Left breast without masses, skin changes or nipple discharge. ABDOMEN:  Soft, non-tender, with active bowel sounds, and no hepatosplenomegaly.  No masses. BACK:  No CVA tenderness.  No tenderness on percussion of the back or rib cage. SKIN:  No rashes, ulcers or lesions. EXTREMITIES: No edema, no skin discoloration or tenderness.  No palpable cords. LYMPH NODES: No palpable cervical,  supraclavicular, axillary or inguinal adenopathy  NEUROLOGICAL: Unremarkable. PSYCH:  Appropriate.   LAB RESULTS:  Appointment on 12/15/2014  Component Date Value Ref Range Status  . WBC 12/15/2014 3.2* 3.8 - 10.6 K/uL Final  . RBC 12/15/2014 2.95* 4.40 - 5.90 MIL/uL Final  . Hemoglobin 12/15/2014 9.3* 13.0 - 18.0 g/dL Final  . HCT 12/15/2014 27.3* 40.0 - 52.0 % Final  . MCV 12/15/2014 92.7  80.0 - 100.0 fL Final  . MCH 12/15/2014 31.4  26.0 - 34.0 pg Final  . MCHC 12/15/2014 33.9  32.0 - 36.0 g/dL Final  . RDW 12/15/2014 14.4  11.5 - 14.5 % Final  . Platelets 12/15/2014 151  150 - 440 K/uL Final  . Neutrophils Relative % 12/15/2014 83   Final  . Neutro Abs 12/15/2014 2.6  1.4 - 6.5 K/uL Final  . Lymphocytes Relative 12/15/2014 8   Final  . Lymphs Abs 12/15/2014 0.3* 1.0 - 3.6 K/uL Final  . Monocytes Relative 12/15/2014 9   Final  . Monocytes Absolute 12/15/2014 0.3  0.2 - 1.0 K/uL Final  . Eosinophils Relative 12/15/2014 0   Final  . Eosinophils Absolute 12/15/2014 0.0  0 - 0.7 K/uL Final  . Basophils Relative 12/15/2014 0   Final  . Basophils Absolute 12/15/2014 0.0  0 - 0.1 K/uL Final  . Sodium 12/15/2014 135  135 - 145 mmol/L Final  . Potassium 12/15/2014 4.1  3.5 - 5.1 mmol/L Final  . Chloride 12/15/2014 104  101 - 111 mmol/L Final  . CO2 12/15/2014 27  22 - 32 mmol/L Final  . Glucose, Bld 12/15/2014 155* 65 - 99 mg/dL Final  . BUN 12/15/2014 15  6 - 20 mg/dL Final  . Creatinine, Ser 12/15/2014 0.77  0.61 - 1.24 mg/dL Final  . Calcium 12/15/2014 7.9* 8.9 - 10.3 mg/dL Final  . GFR calc non Af Amer 12/15/2014 >60  >60 mL/min Final  . GFR calc Af Amer 12/15/2014 >60  >60 mL/min Final   Comment: (NOTE) The eGFR has been calculated using  the CKD EPI equation. This calculation has not been validated in all clinical situations. eGFR's persistently <60 mL/min signify possible Chronic Kidney Disease.   . Anion gap 12/15/2014 4* 5 - 15 Final      STUDIES: Guidance  study., TP3,BRAF,JAK3,BRCA1 mutated in the blood testing (August, 2016) ASSESSMENT: Recurrent or another primary with left upper lobe invading vertebral body. Continue chemoradiation therapy Patient has finished total 6 cycles of chemotherapy He will finished radiation therapy Reevaluation after 4 weeks with PET scan and then reevaluate need for any further treatment  Patient expressed understanding and was in agreement with this plan. He also understands that He can call clinic at any time with any questions, concerns, or complaints.    Carcinoma, lung   Staging form: Lung, AJCC 7th Edition     Clinical: Stage IIIA (T4, N0, M0) - Signed by Forest Gleason, MD on 10/29/2014   Forest Gleason, MD   12/16/2014 8:47 AM

## 2014-12-17 ENCOUNTER — Other Ambulatory Visit: Payer: Self-pay | Admitting: *Deleted

## 2014-12-17 ENCOUNTER — Inpatient Hospital Stay (HOSPITAL_BASED_OUTPATIENT_CLINIC_OR_DEPARTMENT_OTHER): Payer: Medicare HMO | Admitting: Oncology

## 2014-12-17 ENCOUNTER — Ambulatory Visit: Payer: Medicare HMO

## 2014-12-17 ENCOUNTER — Ambulatory Visit
Admission: RE | Admit: 2014-12-17 | Discharge: 2014-12-17 | Disposition: A | Discharge status: Home / Self Care | Payer: Medicare HMO | Source: Ambulatory Visit | Attending: Radiation Oncology | Admitting: Radiation Oncology

## 2014-12-17 ENCOUNTER — Inpatient Hospital Stay: Payer: Medicare HMO

## 2014-12-17 VITALS — BP 152/76 | HR 114 | Temp 95.1°F | Resp 22 | Wt 151.0 lb

## 2014-12-17 DIAGNOSIS — Z5111 Encounter for antineoplastic chemotherapy: Secondary | ICD-10-CM | POA: Diagnosis not present

## 2014-12-17 DIAGNOSIS — C3492 Malignant neoplasm of unspecified part of left bronchus or lung: Secondary | ICD-10-CM

## 2014-12-17 DIAGNOSIS — C7951 Secondary malignant neoplasm of bone: Secondary | ICD-10-CM

## 2014-12-17 DIAGNOSIS — C3412 Malignant neoplasm of upper lobe, left bronchus or lung: Secondary | ICD-10-CM

## 2014-12-17 DIAGNOSIS — Z79899 Other long term (current) drug therapy: Secondary | ICD-10-CM

## 2014-12-17 DIAGNOSIS — Z510 Encounter for antineoplastic radiation therapy: Secondary | ICD-10-CM | POA: Diagnosis not present

## 2014-12-17 DIAGNOSIS — E860 Dehydration: Secondary | ICD-10-CM | POA: Diagnosis not present

## 2014-12-17 LAB — COMPREHENSIVE METABOLIC PANEL
ALBUMIN: 3 g/dL — AB (ref 3.5–5.0)
ALK PHOS: 53 U/L (ref 38–126)
ALT: 15 U/L — AB (ref 17–63)
ANION GAP: 6 (ref 5–15)
AST: 24 U/L (ref 15–41)
BUN: 27 mg/dL — ABNORMAL HIGH (ref 6–20)
CALCIUM: 7.5 mg/dL — AB (ref 8.9–10.3)
CHLORIDE: 98 mmol/L — AB (ref 101–111)
CO2: 28 mmol/L (ref 22–32)
CREATININE: 0.8 mg/dL (ref 0.61–1.24)
GFR calc non Af Amer: >60 mL/min (ref >60)
GLUCOSE: 129 mg/dL — AB (ref 65–99)
Potassium: 3.9 mmol/L (ref 3.5–5.1)
SODIUM: 132 mmol/L — AB (ref 135–145)
Total Bilirubin: 0.4 mg/dL (ref 0.3–1.2)
Total Protein: 6.9 g/dL (ref 6.5–8.1)

## 2014-12-17 LAB — CBC WITH DIFFERENTIAL/PLATELET
BASOS ABS: 0 10*3/uL (ref 0–0.1)
BASOS PCT: 0 %
EOS ABS: 0 10*3/uL (ref 0–0.7)
EOS PCT: 0 %
HCT: 28.9 % — ABNORMAL LOW (ref 40.0–52.0)
HEMOGLOBIN: 9.8 g/dL — AB (ref 13.0–18.0)
Lymphocytes Relative: 4 %
Lymphs Abs: 0.1 10*3/uL — ABNORMAL LOW (ref 1.0–3.6)
MCH: 31.1 pg (ref 26.0–34.0)
MCHC: 33.9 g/dL (ref 32.0–36.0)
MCV: 91.7 fL (ref 80.0–100.0)
Monocytes Absolute: 0.2 10*3/uL (ref 0.2–1.0)
Monocytes Relative: 7 %
NEUTROS PCT: 89 %
Neutro Abs: 2.8 10*3/uL (ref 1.4–6.5)
PLATELETS: 137 10*3/uL — AB (ref 150–440)
RBC: 3.15 MIL/uL — AB (ref 4.40–5.90)
RDW: 14.6 % — ABNORMAL HIGH (ref 11.5–14.5)
WBC: 3.1 10*3/uL — AB (ref 3.8–10.6)

## 2014-12-17 LAB — MAGNESIUM: Magnesium: 1.5 mg/dL — ABNORMAL LOW (ref 1.7–2.4)

## 2014-12-17 MED ORDER — MAGNESIUM SULFATE 2 GM/50ML IV SOLN
2.0000 g | Freq: Once | INTRAVENOUS | Status: AC
  Administered 2014-12-17: 2 g via INTRAVENOUS

## 2014-12-17 MED ORDER — SODIUM CHLORIDE 0.9 % IV SOLN
10.0000 mg | Freq: Once | INTRAVENOUS | Status: AC
  Administered 2014-12-17: 10 mg via INTRAVENOUS
  Filled 2014-12-17: qty 1

## 2014-12-17 MED ORDER — DEXAMETHASONE SODIUM PHOSPHATE 10 MG/ML IJ SOLN: 10.0000 mg | Freq: Once | INTRAMUSCULAR | Status: DC

## 2014-12-17 MED ORDER — SODIUM CHLORIDE 0.9 % IV SOLN
Freq: Once | INTRAVENOUS | Status: AC
  Administered 2014-12-17: 11:00:00 via INTRAVENOUS
  Filled 2014-12-17: qty 1000

## 2014-12-17 NOTE — Progress Notes (Signed)
Patient does have living will.  Former smoker.  Patient acute add on today for dizziness/dehydration.

## 2014-12-18 ENCOUNTER — Ambulatory Visit
Admission: RE | Admit: 2014-12-18 | Discharge: 2014-12-18 | Disposition: A | Discharge status: Home / Self Care | Payer: Medicare HMO | Source: Ambulatory Visit | Attending: Radiation Oncology | Admitting: Radiation Oncology

## 2014-12-18 ENCOUNTER — Encounter: Payer: Self-pay | Admitting: Oncology

## 2014-12-18 DIAGNOSIS — Z510 Encounter for antineoplastic radiation therapy: Secondary | ICD-10-CM | POA: Diagnosis not present

## 2014-12-18 NOTE — Progress Notes (Signed)
[place] @ [facility] [phone]) [phone]  Fax:([phone]) [place]: 02-22-1930  MR#: [id number]  RDE#:[id number]  Patient Care Team: Albina Billet, MD as PCP - General (Internal Medicine)  CHIEF COMPLAINT:  Chief Complaint  Patient presents with  . Acute Visit    Oncology History   1.  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 Further staging workup with PET scan pending (diagnosis based on  CT scan ) July, 2016 2. SB RT for a T1 lesion of the left lung superior segment left upper lobe Biopsy was consistent with non-small cell carcinoma of lung in February of 2015 3.  Started radiation and chemotherapy for left upper lobe recurrent disease versus second primary from November 10, 2014   4.  Patient has finished total 6 weekly cycle of carboplatinum and Taxol on December 15, 2014 Will   Be finished   With radiation therapy by December 21, 2014     Oncology Flowsheet 11/10/2014 11/17/2014 11/24/2014 12/01/2014 12/08/2014 12/15/2014 12/17/2014  Day, Cycle Day 1, Cycle 1 Day 1, Cycle 2 Day 1, Cycle 3 Day 1, Cycle 4 Day 1, 5 Day 1, 5 -  CARBOplatin (PARAPLATIN) IV 160 mg 160 mg 160 mg 160 mg 160 mg 160 mg -  dexamethasone (DECADRON) IV [ 20 mg ] [ 20 mg ] [ 20 mg ] 20 mg 20 mg - 10 mg  enoxaparin (LOVENOX) [place] - - - - - - -  LORazepam (ATIVAN) PO - - - - - - -  ondansetron (ZOFRAN) IV [ 16 mg ] [ 16 mg ] [ 16 mg ] - - [ 8 mg ] -  PACLitaxel (TAXOL) IV 45 mg/m2 45 mg/m2 45 mg/m2 45 mg/m2 45 mg/m2 45 mg/m2 -  prochlorperazine (COMPAZINE) PO - - - 10 mg 10 mg - -    INTERVAL HISTORY: 81 year old gentleman was started on chemotherapy with platinum and Taxol starting from July of 2016.  Also starting radiation from 27th of July.  No nausea no vomiting no diarrhea appetite has been fairly good.  No cough or shortness of breath or chest pain in the left upper chest wall area Patient is not is here for second cycle of chemotherapy.  Started radiation treatment.  No  nausea no vomiting no diarrhea.  No tingling numbness.  December 15, 2014 Patient is here for ongoing evaluation and treatment consideration.  No chills.  No fever.  No nausea.  No diarrhea.  Complains of dry hacking cough.  Patient is finishing up radiation therapy in first week of September no tingling numbness. December 17, 2014 (acute visit for weakness) Patient came as an acute add-on complaining of severe weakness.  According to him after last chemotherapy day before yesterday patient felt extremely weak yesterday.  CalledEMS they advised patient to go to our office or emergency room Patient diffuse.  Patient is here today for radiation therapy. This is feeling weak.  Has some cough.  No difficulty swallowing.  Overall poor intake.  Her appetite.  No chills.  No fever. REVIEW OF SYSTEMS:   GENERAL:  Feeling somewhat weak and tired. PERFORMANCE STATUS (ECOG):01 HEENT:  No visual changes, runny nose, sore throat, mouth sores or tenderness. Lungs: Dry hacking cough.  No hemoptysis. Cardiac:  No chest pain, palpitations, orthopnea, or PND. GI:  No nausea, vomiting, diarrhea, constipation, melena or hematochezia. GU:  No urgency, frequency, dysuria, or hematuria. Musculoskeletal:  Left shoulder pain Extremities:  No pain or swelling. Skin:  No rashes or skin changes. Neuro:  No headache, numbness or weakness, balance or coordination issues. Endocrine:  No diabetes, thyroid issues, hot flashes or night sweats. Psych:  No mood changes, depression or anxiety. Pain:  No focal pain. Review of systems:  All other systems reviewed and found to be negative. As per HPI. Otherwise, a complete review of systems is negatve.  PAST MEDICAL HISTORY: Past Medical History  Diagnosis Date  . Major depressive disorder, recurrent, severe with psychotic features   . Persistent disorder of initiating or maintaining sleep   . Generalized anxiety disorder   . Major depressive disorder, recurrent, severe with  psychotic features   . Persistent disorder of initiating or maintaining sleep   . Asthma   . Cancer     Left lung  . COPD (chronic obstructive pulmonary disease)     PAST SURGICAL HISTORY: Past Surgical History  Procedure Laterality Date  . Hemorroidectomy    . Lung surgery      FAMILY HISTORY Family History  Problem Relation Age of Onset  . COPD Mother   . Lung cancer Father     ADVANCED DIRECTIVES:  Patient does have advance healthcare directive, Patient   does not desire to make any changes HEALTH MAINTENANCE: Social History  Substance Use Topics  . Smoking status: Former Research scientist (life sciences)  . Smokeless tobacco: Never Used     Comment: quit 18 years ago  . Alcohol Use: No      Allergies  Allergen Reactions  . Penicillins     Current Outpatient Prescriptions  Medication Sig Dispense Refill  . albuterol (PROVENTIL HFA;VENTOLIN HFA) 108 (90 BASE) MCG/ACT inhaler Inhale 1-2 puffs into the lungs QID.     Marland Kitchen atorvastatin (LIPITOR) 40 MG tablet Take 1 tablet by mouth daily.    Marland Kitchen azithromycin (ZITHROMAX) 250 MG tablet 1 tab po daily for 3 days 6 each 0  . cefUROXime (CEFTIN) 500 MG tablet Take 1 tablet (500 mg total) by mouth 2 (two) times daily with a meal. 10 tablet 0  . econazole nitrate 1 % cream Apply 1 application topically 2 (two) times daily.     Marland Kitchen esomeprazole (NEXIUM) 40 MG capsule Take 40 mg by mouth daily at 12 noon.    Marland Kitchen esomeprazole (NEXIUM) 40 MG packet Take 1 packet by mouth daily.    Marland Kitchen HYDROcodone-acetaminophen (NORCO/VICODIN) 5-325 MG per tablet Take 1 tablet by mouth every 4 (four) hours as needed for moderate pain. 30 tablet 0  . LORazepam (ATIVAN) 1 MG tablet Take 1 tablet 3 times a day and one half a tablet once a day. 315 tablet 0  . mometasone-formoterol (DULERA) 200-5 MCG/ACT AERO Inhale 2 puffs into the lungs 2 (two) times daily.     . ondansetron (ZOFRAN) 8 MG tablet Take 1 tablet (8 mg total) by mouth 2 (two) times daily. Start the day after chemo for 3  days. Then take as needed for nausea or vomiting. 30 tablet 1  . predniSONE (DELTASONE) 10 MG tablet Take 2 tabs orally once a day for 7 days, then take 1 tab orally once a day for 7 days 21 tablet 0  . prochlorperazine (COMPAZINE) 10 MG tablet Take 1 tablet (10 mg total) by mouth every 6 (six) hours as needed (Nausea or vomiting). 30 tablet 1  . QUEtiapine (SEROQUEL XR) 300 MG 24 hr tablet Take 1 tablet (300 mg total) by mouth at bedtime. 90 tablet 0  . tamsulosin (  FLOMAX) 0.4 MG CAPS capsule Take 1 capsule by mouth daily.    Marland Kitchen tiotropium (SPIRIVA) 18 MCG inhalation capsule Place into inhaler and inhale daily.    Marland Kitchen triamcinolone cream (KENALOG) 0.1 %     . venlafaxine XR (EFFEXOR-XR) 150 MG 24 hr capsule Take 1 capsule (150 mg total) by mouth daily. 90 capsule 0  . zolpidem (AMBIEN) 10 MG tablet TAKE ONE TABLET AT BEDTIME 30 tablet 1   No current facility-administered medications for this visit.    OBJECTIVE:  Filed Vitals:   12/17/14 1011  BP: 152/76  Pulse: 114  Temp: 95.1 F (35.1 C)  Resp: 22     Body mass index is 23.29 kg/(m^2).    ECOG FS:1 - Symptomatic but completely ambulatory  PHYSICAL EXAM: GENERAL:  Well developed, well nourished, sitting comfortably in the exam room in no acute distress. MENTAL STATUS:  Alert and oriented to person, place and time. HEAD: Alopecia  Normocephalic, atraumatic, face symmetric, no Cushingoid features. EYES:  Pupils equal round and reactive to light and accomodation.  No conjunctivitis or scleral icterus. ENT:  Oropharynx clear without lesion.  Tongue normal. Mucous membranes moist.  RESPIRATORY:  Clear to auscultation without rales, wheezes or rhonchi. CARDIOVASCULAR:  Regular rate and rhythm without murmur, rub or gallop. BREAST:  Right breast without masses, skin changes or nipple discharge.  Left breast without masses, skin changes or nipple discharge. ABDOMEN:  Soft, non-tender, with active bowel sounds, and no hepatosplenomegaly.  No  masses. BACK:  No CVA tenderness.  No tenderness on percussion of the back or rib cage. SKIN:  No rashes, ulcers or lesions. EXTREMITIES: No edema, no skin discoloration or tenderness.  No palpable cords. LYMPH NODES: No palpable cervical, supraclavicular, axillary or inguinal adenopathy  NEUROLOGICAL: Unremarkable. PSYCH:  Appropriate.   LAB RESULTS:  Appointment on 12/17/2014  Component Date Value Ref Range Status  . WBC 12/17/2014 3.1* 3.8 - 10.6 K/uL Final  . RBC 12/17/2014 3.15* 4.40 - 5.90 MIL/uL Final  . Hemoglobin 12/17/2014 9.8* 13.0 - 18.0 g/dL Final  . HCT 12/17/2014 28.9* 40.0 - 52.0 % Final  . MCV 12/17/2014 91.7  80.0 - 100.0 fL Final  . MCH 12/17/2014 31.1  26.0 - 34.0 pg Final  . MCHC 12/17/2014 33.9  32.0 - 36.0 g/dL Final  . RDW 12/17/2014 14.6* 11.5 - 14.5 % Final  . Platelets 12/17/2014 137* 150 - 440 K/uL Final  . Neutrophils Relative % 12/17/2014 89   Final  . Neutro Abs 12/17/2014 2.8  1.4 - 6.5 K/uL Final  . Lymphocytes Relative 12/17/2014 4   Final  . Lymphs Abs 12/17/2014 0.1* 1.0 - 3.6 K/uL Final  . Monocytes Relative 12/17/2014 7   Final  . Monocytes Absolute 12/17/2014 0.2  0.2 - 1.0 K/uL Final  . Eosinophils Relative 12/17/2014 0   Final  . Eosinophils Absolute 12/17/2014 0.0  0 - 0.7 K/uL Final  . Basophils Relative 12/17/2014 0   Final  . Basophils Absolute 12/17/2014 0.0  0 - 0.1 K/uL Final  . Sodium 12/17/2014 132* 135 - 145 mmol/L Final  . Potassium 12/17/2014 3.9  3.5 - 5.1 mmol/L Final  . Chloride 12/17/2014 98* 101 - 111 mmol/L Final  . CO2 12/17/2014 28  22 - 32 mmol/L Final  . Glucose, Bld 12/17/2014 129* 65 - 99 mg/dL Final  . BUN 12/17/2014 27* 6 - 20 mg/dL Final  . Creatinine, Ser 12/17/2014 0.80  0.61 - 1.24 mg/dL Final  . Calcium  12/17/2014 7.5* 8.9 - 10.3 mg/dL Final  . Total Protein 12/17/2014 6.9  6.5 - 8.1 g/dL Final  . Albumin 12/17/2014 3.0* 3.5 - 5.0 g/dL Final  . AST 12/17/2014 24  15 - 41 U/L Final  . ALT 12/17/2014 15*  17 - 63 U/L Final  . Alkaline Phosphatase 12/17/2014 53  38 - 126 U/L Final  . Total Bilirubin 12/17/2014 0.4  0.3 - 1.2 mg/dL Final  . GFR calc non Af Amer 12/17/2014 >60  >60 mL/min Final  . GFR calc Af Amer 12/17/2014 >60  >60 mL/min Final   Comment: (NOTE) The eGFR has been calculated using the CKD EPI equation. This calculation has not been validated in all clinical situations. eGFR's persistently <60 mL/min signify possible Chronic Kidney Disease.   . Anion gap 12/17/2014 6  5 - 15 Final  . Magnesium 12/17/2014 1.5* 1.7 - 2.4 mg/dL Final      STUDIES: Guidance study., TP3,BRAF,JAK3,BRCA1 mutated in the blood testing (August, 2016) ASSESSMENT: Recurrent or another primary with left upper lobe invading vertebral body. Continue chemoradiation therapy Patient has finished total 6 cycles of chemotherapy He will finished radiation therapy Reevaluation after 4 weeks with PET scan and then reevaluate need for any further treatment Acute visit on December 17, 2014 Patient appears to be mildly dehydrated. Hypomagnesemia. Proceed with IV fluid IV steroid continue prednisone in tapering dose. IV magnesium. The patient does not feel any better was advised to call us for continuing IV fluid management Most likely weakness is secondary to poor oral intake with chemotherapy and radiation side effect  Patient expressed understanding and was in agreement with this plan. He also understands that He can call clinic at any time with any questions, concerns, or complaints.    Carcinoma, lung   Staging form: Lung, AJCC 7th Edition     Clinical: Stage IIIA (T4, N0, M0) - Signed by Forest Gleason, MD on 10/29/2014   Forest Gleason, MD   12/18/2014 8:31 AM

## 2014-12-19 ENCOUNTER — Ambulatory Visit
Admission: RE | Admit: 2014-12-19 | Discharge: 2014-12-19 | Disposition: A | Discharge status: Home / Self Care | Payer: Medicare HMO | Source: Ambulatory Visit | Attending: Radiation Oncology | Admitting: Radiation Oncology

## 2014-12-19 DIAGNOSIS — Z510 Encounter for antineoplastic radiation therapy: Secondary | ICD-10-CM | POA: Diagnosis not present

## 2014-12-23 ENCOUNTER — Ambulatory Visit
Admission: RE | Admit: 2014-12-23 | Discharge: 2014-12-23 | Disposition: A | Discharge status: Home / Self Care | Payer: Medicare HMO | Source: Ambulatory Visit | Attending: Radiation Oncology | Admitting: Radiation Oncology

## 2014-12-23 DIAGNOSIS — Z510 Encounter for antineoplastic radiation therapy: Secondary | ICD-10-CM | POA: Diagnosis not present

## 2014-12-24 ENCOUNTER — Ambulatory Visit
Admission: RE | Admit: 2014-12-24 | Discharge: 2014-12-24 | Disposition: A | Discharge status: Home / Self Care | Payer: Medicare HMO | Source: Ambulatory Visit | Attending: Radiation Oncology | Admitting: Radiation Oncology

## 2014-12-24 DIAGNOSIS — Z510 Encounter for antineoplastic radiation therapy: Secondary | ICD-10-CM | POA: Diagnosis not present

## 2014-12-25 ENCOUNTER — Ambulatory Visit: Payer: Medicare HMO

## 2014-12-25 DIAGNOSIS — Z510 Encounter for antineoplastic radiation therapy: Secondary | ICD-10-CM | POA: Diagnosis not present

## 2014-12-26 ENCOUNTER — Ambulatory Visit: Payer: Medicare HMO

## 2014-12-26 DIAGNOSIS — Z510 Encounter for antineoplastic radiation therapy: Secondary | ICD-10-CM | POA: Diagnosis not present

## 2014-12-29 ENCOUNTER — Telehealth: Payer: Self-pay | Admitting: Urology

## 2014-12-29 NOTE — Telephone Encounter (Signed)
Mr. Timothy Ali called today asking who supplied his catheter supplies. I looked in allscripts and the last time we ordered them it was thru [employer] back in April 2015. That was also the last time we saw this patient. I spoke with Larene Beach and checked with her to make sure i was telling him the correct information but i advised Mr. Timothy Ali that we were not able to order him more until he was seen. He was very agitated and would not agree to an appointment and hung up on me. He wanted to know who he was getting his catheter's from and I told him we last ordered them from [employer] [facility]. He didn't seem to think that was correct?  Thanks, Sharyn Lull

## 2014-12-31 ENCOUNTER — Other Ambulatory Visit: Payer: Self-pay | Admitting: Psychiatry

## 2014-12-31 NOTE — Telephone Encounter (Signed)
This was done on 12-09-14.

## 2015-01-05 ENCOUNTER — Ambulatory Visit (INDEPENDENT_AMBULATORY_CARE_PROVIDER_SITE_OTHER): Payer: Medicare HMO | Admitting: Psychiatry

## 2015-01-05 ENCOUNTER — Encounter: Payer: Self-pay | Admitting: Psychiatry

## 2015-01-05 VITALS — BP 138/76 | HR 75 | Temp 98.3°F | Ht 67.5 in | Wt 151.2 lb

## 2015-01-05 DIAGNOSIS — F333 Major depressive disorder, recurrent, severe with psychotic symptoms: Secondary | ICD-10-CM

## 2015-01-05 DIAGNOSIS — F411 Generalized anxiety disorder: Secondary | ICD-10-CM

## 2015-01-05 MED ORDER — QUETIAPINE FUMARATE ER 300 MG PO TB24: 300.0000 mg | ORAL_TABLET | Freq: Every day | ORAL | Status: DC

## 2015-01-05 MED ORDER — VENLAFAXINE HCL ER 150 MG PO CP24: 150.0000 mg | ORAL_CAPSULE | Freq: Every day | ORAL | Status: DC

## 2015-01-05 MED ORDER — ZOLPIDEM TARTRATE 10 MG PO TABS: 10.0000 mg | ORAL_TABLET | Freq: Every day | ORAL | Status: DC

## 2015-01-05 MED ORDER — LORAZEPAM 1 MG PO TABS: ORAL_TABLET | ORAL | Status: DC

## 2015-01-05 NOTE — Progress Notes (Signed)
[facility] MD/PA/NP OP Progress Note  01/05/2015 12:36 PM Timothy Ali  MRN:  [id number]  Subjective:  Patient indicated he's been undergoing chemotherapy and radiation for what he calls stage IV lung cancer. He indicates today he really wants to go back to his previous dose of lorazepam 1 mg 4 times a day. I had decreased the patient to 3-1/2 mg daily. I have informed him I really want him to remain at the current dose. He states his main concern is his cancer. He is worried about having future pain related to his cancer. I told him he could tell his doctors that are treating him and they would likely address any complaints.  He indicates overall his mood is okay but again his main stressor is the long cancer. No signs of major depressive disorder at this time but more anxiety in response to a significant stressor. Chief Complaint: "chemotherapy and radiation" Chief Complaint    Follow-up; Medication Refill     Visit Diagnosis:  No diagnosis found.  Past Medical History:  Past Medical History  Diagnosis Date  . Major depressive disorder, recurrent, severe with psychotic features   . Persistent disorder of initiating or maintaining sleep   . Generalized anxiety disorder   . Major depressive disorder, recurrent, severe with psychotic features   . Persistent disorder of initiating or maintaining sleep   . Asthma   . Cancer     Left lung  . COPD (chronic obstructive pulmonary disease)     Past Surgical History  Procedure Laterality Date  . Hemorroidectomy    . Lung surgery     Family History:  Family History  Problem Relation Age of Onset  . COPD Mother   . Lung cancer Father    Social History:  Social History   Social History  . Marital Status: Widowed    Spouse Name: N/A  . Number of Children: N/A  . Years of Education: N/A   Social History Main Topics  . Smoking status: Former Research scientist (life sciences)  . Smokeless tobacco: Never Used     Comment: quit 18 years ago  . Alcohol Use: No  .  Drug Use: No  . Sexual Activity: No   Other Topics Concern  . None   Social History Narrative   Additional History:   Assessment:   Musculoskeletal: Strength & Muscle Tone: within normal limits Gait & Station: normal Patient leans: N/A  Psychiatric Specialty Exam: HPI  Review of Systems  Psychiatric/Behavioral: Negative for depression, suicidal ideas, hallucinations, memory loss and substance abuse. The patient is nervous/anxious. The patient does not have insomnia (doing well with Ambien).     Blood pressure 138/76, pulse 75, temperature 98.3 F (36.8 C), temperature source Tympanic, height 5' 7.5" (1.715 m), weight 151 lb 3.2 oz (68.584 kg), SpO2 85 %.Body mass index is 23.32 kg/(m^2).  General Appearance: Neat and Well Groomed  Eye Contact:  Good  Speech:  Clear and Coherent and Normal Rate  Volume:  Normal  Mood:  "Devastating" regarding upcoming cancer treatment  Affect:  Constricted  Thought Process:  Linear and Logical  Orientation:  Full (Time, Place, and Person)  Thought Content:  Not verbalizing paranoid ideation as in past visit  Suicidal Thoughts:  No  Homicidal Thoughts:  No  Memory:  Immediate;   Good Recent;   Good Remote;   Good  Judgement:  Good  Insight:  Fair  Psychomotor Activity:  Negative  Concentration:  Good  Recall:  Good  Fund of  Knowledge: Good  Language: Good  Akathisia:  Negative  Handed:  Right unknown  AIMS (if indicated):  Not done  Assets:  Desire for Improvement  ADL's:  Intact  Cognition: WNL  Sleep:  5 hour per night   Is the patient at risk to self?  No. Has the patient been a risk to self in the past 6 months?  No. Has the patient been a risk to self within the distant past?  No. Is the patient a risk to others?  No. Has the patient been a risk to others in the past 6 months?  No. Has the patient been a risk to others within the distant past?  No.  Current Medications: Current Outpatient Prescriptions  Medication Sig  Dispense Refill  . albuterol (PROVENTIL HFA;VENTOLIN HFA) 108 (90 BASE) MCG/ACT inhaler Inhale 1-2 puffs into the lungs QID.     Marland Kitchen atorvastatin (LIPITOR) 40 MG tablet Take 1 tablet by mouth daily.    Marland Kitchen azithromycin (ZITHROMAX) 250 MG tablet 1 tab po daily for 3 days 6 each 0  . cefUROXime (CEFTIN) 500 MG tablet Take 1 tablet (500 mg total) by mouth 2 (two) times daily with a meal. 10 tablet 0  . econazole nitrate 1 % cream Apply 1 application topically 2 (two) times daily.     Marland Kitchen esomeprazole (NEXIUM) 40 MG capsule Take 40 mg by mouth daily at 12 noon.    Marland Kitchen esomeprazole (NEXIUM) 40 MG packet Take 1 packet by mouth daily.    Marland Kitchen HYDROcodone-acetaminophen (NORCO/VICODIN) 5-325 MG per tablet Take 1 tablet by mouth every 4 (four) hours as needed for moderate pain. 30 tablet 0  . LORazepam (ATIVAN) 1 MG tablet Take 1 tablet 3 times a day and one half a tablet once a day. 315 tablet 0  . mometasone-formoterol (DULERA) 200-5 MCG/ACT AERO Inhale 2 puffs into the lungs 2 (two) times daily.     . ondansetron (ZOFRAN) 8 MG tablet Take 1 tablet (8 mg total) by mouth 2 (two) times daily. Start the day after chemo for 3 days. Then take as needed for nausea or vomiting. 30 tablet 1  . predniSONE (DELTASONE) 10 MG tablet Take 2 tabs orally once a day for 7 days, then take 1 tab orally once a day for 7 days 21 tablet 0  . prochlorperazine (COMPAZINE) 10 MG tablet Take 1 tablet (10 mg total) by mouth every 6 (six) hours as needed (Nausea or vomiting). 30 tablet 1  . QUEtiapine (SEROQUEL XR) 300 MG 24 hr tablet Take 1 tablet (300 mg total) by mouth at bedtime. 90 tablet 0  . tamsulosin (FLOMAX) 0.4 MG CAPS capsule Take 1 capsule by mouth daily.    Marland Kitchen tiotropium (SPIRIVA) 18 MCG inhalation capsule Place into inhaler and inhale daily.    Marland Kitchen triamcinolone cream (KENALOG) 0.1 %     . venlafaxine XR (EFFEXOR-XR) 150 MG 24 hr capsule Take 1 capsule (150 mg total) by mouth daily. 90 capsule 0  . zolpidem (AMBIEN) 10 MG tablet  Take 1 tablet (10 mg total) by mouth at bedtime. 30 tablet 2   No current facility-administered medications for this visit.    Medical Decision Making:  Established Problem, Stable/Improving (1), Review of Medication Regimen & Side Effects (2) and Review of New Medication or Change in Dosage (2)  Treatment Plan Summary:Medication management and Plan Continue lorazepam 1 mg 3 times a day and a half a milligram once a day. Patient is high resistant to  further decrease his and given his most recent stressor we will not decrease his lorazepam at this time. We'll continue him on his Effexor XR at 150 mg daily, we'll continue the Seroquel XR 300 mg at bedtime. Continue his Ambien 10 mg at bedtime as needed.    Major depressive disorder, recurrent severe with psychotic features-stable on Effexor and Seroquel.  Denies anxiety disorder-we'll continue use the Effexor and keep the lorazepam at the current dosing. Ideally it might be better patient be on lower doses but given his acute stressors will continue the same dose. He is awaiting some update from his cancer doctors in 1 month. He states they are going to compare some imaging at that time.  He will follow-up in 2 months.   Faith Rogue 01/05/2015, 12:36 PM

## 2015-01-12 ENCOUNTER — Inpatient Hospital Stay: Payer: Medicare HMO | Attending: Oncology

## 2015-01-12 ENCOUNTER — Inpatient Hospital Stay: Payer: Medicare HMO | Admitting: Oncology

## 2015-01-12 ENCOUNTER — Emergency Department: Payer: Medicare HMO

## 2015-01-12 ENCOUNTER — Encounter: Payer: Self-pay | Admitting: Emergency Medicine

## 2015-01-12 ENCOUNTER — Emergency Department
Admission: EM | Admit: 2015-01-12 | Discharge: 2015-01-12 | Disposition: A | Discharge status: Home / Self Care | Payer: Medicare HMO | Attending: Emergency Medicine | Admitting: Emergency Medicine

## 2015-01-12 DIAGNOSIS — K5900 Constipation, unspecified: Secondary | ICD-10-CM | POA: Diagnosis present

## 2015-01-12 DIAGNOSIS — Z880 Allergy status to penicillin: Secondary | ICD-10-CM | POA: Diagnosis not present

## 2015-01-12 DIAGNOSIS — Z87891 Personal history of nicotine dependence: Secondary | ICD-10-CM | POA: Insufficient documentation

## 2015-01-12 DIAGNOSIS — Z7951 Long term (current) use of inhaled steroids: Secondary | ICD-10-CM | POA: Insufficient documentation

## 2015-01-12 DIAGNOSIS — Z792 Long term (current) use of antibiotics: Secondary | ICD-10-CM | POA: Insufficient documentation

## 2015-01-12 LAB — CBC WITH DIFFERENTIAL/PLATELET
BASOS ABS: 0 10*3/uL (ref 0–0.1)
BASOS PCT: 1 %
EOS ABS: 0.2 10*3/uL (ref 0–0.7)
Eosinophils Relative: 4 %
HCT: 30.7 % — ABNORMAL LOW (ref 40.0–52.0)
Hemoglobin: 10.2 g/dL — ABNORMAL LOW (ref 13.0–18.0)
Lymphocytes Relative: 12 %
Lymphs Abs: 0.7 10*3/uL — ABNORMAL LOW (ref 1.0–3.6)
MCH: 31.5 pg (ref 26.0–34.0)
MCHC: 33.3 g/dL (ref 32.0–36.0)
MCV: 94.4 fL (ref 80.0–100.0)
MONO ABS: 0.7 10*3/uL (ref 0.2–1.0)
MONOS PCT: 12 %
NEUTROS PCT: 71 %
Neutro Abs: 4.2 10*3/uL (ref 1.4–6.5)
Platelets: 180 10*3/uL (ref 150–440)
RBC: 3.25 MIL/uL — ABNORMAL LOW (ref 4.40–5.90)
RDW: 18.2 % — AB (ref 11.5–14.5)
WBC: 5.9 10*3/uL (ref 3.8–10.6)

## 2015-01-12 LAB — COMPREHENSIVE METABOLIC PANEL
ALBUMIN: 3.3 g/dL — AB (ref 3.5–5.0)
ALT: 10 U/L — ABNORMAL LOW (ref 17–63)
ANION GAP: 7 (ref 5–15)
AST: 17 U/L (ref 15–41)
Alkaline Phosphatase: 60 U/L (ref 38–126)
BUN: 17 mg/dL (ref 6–20)
CHLORIDE: 105 mmol/L (ref 101–111)
CO2: 29 mmol/L (ref 22–32)
Calcium: 9.2 mg/dL (ref 8.9–10.3)
Creatinine, Ser: 0.59 mg/dL — ABNORMAL LOW (ref 0.61–1.24)
GFR calc Af Amer: >60 mL/min (ref >60)
GFR calc non Af Amer: >60 mL/min (ref >60)
GLUCOSE: 119 mg/dL — AB (ref 65–99)
POTASSIUM: 4 mmol/L (ref 3.5–5.1)
SODIUM: 141 mmol/L (ref 135–145)
Total Bilirubin: 0.3 mg/dL (ref 0.3–1.2)
Total Protein: 7.2 g/dL (ref 6.5–8.1)

## 2015-01-12 LAB — LIPASE, BLOOD: Lipase: 19 U/L — ABNORMAL LOW (ref 22–51)

## 2015-01-12 MED ORDER — LORAZEPAM 2 MG/ML IJ SOLN
0.5000 mg | Freq: Once | INTRAMUSCULAR | Status: AC
  Administered 2015-01-12: 0.5 mg via INTRAVENOUS

## 2015-01-12 MED ORDER — MINERAL OIL RE ENEM
1.0000 | ENEMA | Freq: Once | RECTAL | Status: AC
Start: 2015-01-12 — End: 2015-01-12
  Administered 2015-01-12: 1 via RECTAL

## 2015-01-12 MED ORDER — DOCUSATE SODIUM 100 MG PO CAPS
100.0000 mg | ORAL_CAPSULE | Freq: Once | ORAL | Status: AC
  Administered 2015-01-12: 100 mg via ORAL
  Filled 2015-01-12: qty 1

## 2015-01-12 MED ORDER — MAGNESIUM CITRATE PO SOLN
150.0000 mL | Freq: Once | ORAL | Status: AC
  Administered 2015-01-12: 150 mL via ORAL
  Filled 2015-01-12: qty 296

## 2015-01-12 MED ORDER — LORAZEPAM 2 MG/ML IJ SOLN
INTRAMUSCULAR | Status: AC
  Administered 2015-01-12: 0.5 mg via INTRAVENOUS
  Filled 2015-01-12: qty 1

## 2015-01-12 MED ORDER — LACTULOSE 10 GM/15ML PO SOLN
ORAL | Status: AC
  Filled 2015-01-12: qty 60

## 2015-01-12 MED ORDER — LACTULOSE 10 GM/15ML PO SOLN
30.0000 g | Freq: Once | ORAL | Status: AC
  Administered 2015-01-12: 30 g via ORAL
  Filled 2015-01-12: qty 60

## 2015-01-12 MED ORDER — SODIUM CHLORIDE 0.9 % IV BOLUS (SEPSIS)
500.0000 mL | Freq: Once | INTRAVENOUS | Status: AC
  Administered 2015-01-12: 500 mL via INTRAVENOUS

## 2015-01-12 NOTE — ED Notes (Signed)
Patient is resting comfortably. 

## 2015-01-12 NOTE — ED Notes (Signed)
Pt resting in bed with ED tech Beth at side; still holding mineral oil enema;

## 2015-01-12 NOTE — ED Provider Notes (Signed)
Patient remains awake alert no distress. Abdomen is true rent, but soft and nontender. He reports that he is passing gas well but still feels constipated. He is in no distress, no peritonitis. Not vomiting. Discussed with the patient is sister ongoing treatment at home, and close follow-up precautions. He and sister are agreeable. They will try magnesium citrate at home this afternoon and call Dr. Vira Agar for close follow-up and further recommendations.  Careful return precautions including vomiting, severe pain, weakness, inability to stool after 24 hours discussed the patient and he is agreeable.  Delman Kitten, MD 01/12/15 [phone]

## 2015-01-12 NOTE — ED Notes (Signed)
Spoke with Dr Dahlia Client regarding pt & orders obtained

## 2015-01-12 NOTE — ED Notes (Signed)
Family at bedside. 

## 2015-01-12 NOTE — Discharge Instructions (Signed)
Please take 1 over-the-counter bottle (182m) of magnesium citrate when you get home, you may repeat this once this evening if you have not yet stooled. Return to the emergency room right away if he develop a fever, severe pain, vomiting, he feel your symptoms are worsening, or other new concerns or symptoms arise.  Constipation Constipation is when a person has fewer than three bowel movements a week, has difficulty having a bowel movement, or has stools that are dry, hard, or larger than normal. As people grow older, constipation is more common. If you try to fix constipation with medicines that make you have a bowel movement (laxatives), the problem may get worse. Long-term laxative use may cause the muscles of the colon to become weak. A low-fiber diet, not taking in enough fluids, and taking certain medicines may make constipation worse.  CAUSES   Certain medicines, such as antidepressants, pain medicine, iron supplements, antacids, and water pills.   Certain diseases, such as diabetes, irritable bowel syndrome (IBS), thyroid disease, or depression.   Not drinking enough water.   Not eating enough fiber-rich foods.   Stress or travel.   Lack of physical activity or exercise.   Ignoring the urge to have a bowel movement.   Using laxatives too much.  SIGNS AND SYMPTOMS   Having fewer than three bowel movements a week.   Straining to have a bowel movement.   Having stools that are hard, dry, or larger than normal.   Feeling full or bloated.   Pain in the lower abdomen.   Not feeling relief after having a bowel movement.  DIAGNOSIS  Your health care provider will take a medical history and perform a physical exam. Further testing may be done for severe constipation. Some tests may include:  A barium enema X-ray to examine your rectum, colon, and, sometimes, your small intestine.   A sigmoidoscopy to examine your lower colon.   A colonoscopy to examine your  entire colon. TREATMENT  Treatment will depend on the severity of your constipation and what is causing it. Some dietary treatments include drinking more fluids and eating more fiber-rich foods. Lifestyle treatments may include regular exercise. If these diet and lifestyle recommendations do not help, your health care provider may recommend taking over-the-counter laxative medicines to help you have bowel movements. Prescription medicines may be prescribed if over-the-counter medicines do not work.  HOME CARE INSTRUCTIONS   Eat foods that have a lot of fiber, such as fruits, vegetables, whole grains, and beans.  Limit foods high in fat and processed sugars, such as french fries, hamburgers, cookies, candies, and soda.   A fiber supplement may be added to your diet if you cannot get enough fiber from foods.   Drink enough fluids to keep your urine clear or pale yellow.   Exercise regularly or as directed by your health care provider.   Go to the restroom when you have the urge to go. Do not hold it.   Only take over-the-counter or prescription medicines as directed by your health care provider. Do not take other medicines for constipation without talking to your health care provider first.  [place]:   You have bright red blood in your stool.   Your constipation lasts for more than 4 days or gets worse.   You have abdominal or rectal pain.   You have thin, pencil-like stools.   You have unexplained weight loss. MAKE SURE YOU:   Understand these instructions.  Will  watch your condition.  Will get help right away if you are not doing well or get worse. Document Released: 01/01/2004 Document Revised: 04/09/2013 Document Reviewed: 01/14/2013 [facility] Patient Information 2015 [place], [place]. This information is not intended to replace advice given to you by your health care provider. Make sure you discuss any questions you have with your health care  provider.

## 2015-01-12 NOTE — ED Provider Notes (Signed)
[facility] Emergency Department Jamise [place] Note  ____________________________________________  Time seen: Approximately 5:15 AM  I have reviewed the triage vital signs and the nursing notes.   HISTORY  Chief Complaint Constipation    HPI Timothy Ali is a 81 y.o. male who presents to the ED from home with a chief complaint of constipation. Patient reports no good bowel movement 1 week. Self administered an enema this evening and produced just a tiny ball of stool. Denies associated nausea, vomiting or abdominal pain. Just states he "feels bloated". Denies recent fever, chills, chest pain, shortness of breath, dysuria, urinary retention. Similar symptoms previously secondary to prior radiation therapy. Nothing makes the symptoms better or worse.   Past Medical History  Diagnosis Date  . Major depressive disorder, recurrent, severe with psychotic features   . Persistent disorder of initiating or maintaining sleep   . Generalized anxiety disorder   . Major depressive disorder, recurrent, severe with psychotic features   . Persistent disorder of initiating or maintaining sleep   . Asthma   . Cancer     Left lung  . COPD (chronic obstructive pulmonary disease)   Wears continuous oxygen  Patient Active Problem List   Diagnosis Date Noted  . Lung cancer 10/24/2014  . Acute on chronic respiratory failure 10/23/2014  . Leucocytosis 10/23/2014  . COPD (chronic obstructive pulmonary disease) 10/23/2014  . Carcinoma, lung 10/23/2014  . CAFL (chronic airflow limitation) 08/01/2014  . Depression, major, recurrent, severe with psychosis 08/01/2014  . H/O gastrointestinal disease 08/01/2014  . Anxiety, generalized 08/01/2014  . H/O elevated lipids 08/01/2014  . Insomnia, persistent 08/01/2014  . H/O malignant neoplasm 08/01/2014  . Prostate disease 08/01/2014  . Macular degeneration 08/01/2014  . Obstruction of urinary tract 08/01/2014    Past Surgical  History  Procedure Laterality Date  . Hemorroidectomy    . Lung surgery      Current Outpatient Rx  Name  Route  Sig  Dispense  Refill  . albuterol (PROVENTIL HFA;VENTOLIN HFA) 108 (90 BASE) MCG/ACT inhaler   Inhalation   Inhale 1-2 puffs into the lungs QID.          Marland Kitchen atorvastatin (LIPITOR) 40 MG tablet   Oral   Take 1 tablet by mouth daily.         Marland Kitchen azithromycin (ZITHROMAX) 250 MG tablet      1 tab po daily for 3 days   6 each   0   . cefUROXime (CEFTIN) 500 MG tablet   Oral   Take 1 tablet (500 mg total) by mouth 2 (two) times daily with a meal.   10 tablet   0   . econazole nitrate 1 % cream   Topical   Apply 1 application topically 2 (two) times daily.          Marland Kitchen esomeprazole (NEXIUM) 40 MG capsule   Oral   Take 40 mg by mouth daily at 12 noon.         Marland Kitchen esomeprazole (NEXIUM) 40 MG packet   Oral   Take 1 packet by mouth daily.         Marland Kitchen HYDROcodone-acetaminophen (NORCO/VICODIN) 5-325 MG per tablet   Oral   Take 1 tablet by mouth every 4 (four) hours as needed for moderate pain.   30 tablet   0   . LORazepam (ATIVAN) 1 MG tablet      Take 1 tablet 3 times a day and one half a tablet once  a day.   315 tablet   0   . mometasone-formoterol (DULERA) 200-5 MCG/ACT AERO   Inhalation   Inhale 2 puffs into the lungs 2 (two) times daily.          . ondansetron (ZOFRAN) 8 MG tablet   Oral   Take 1 tablet (8 mg total) by mouth 2 (two) times daily. Start the day after chemo for 3 days. Then take as needed for nausea or vomiting.   30 tablet   1   . predniSONE (DELTASONE) 10 MG tablet      Take 2 tabs orally once a day for 7 days, then take 1 tab orally once a day for 7 days   21 tablet   0   . prochlorperazine (COMPAZINE) 10 MG tablet   Oral   Take 1 tablet (10 mg total) by mouth every 6 (six) hours as needed (Nausea or vomiting).   30 tablet   1   . QUEtiapine (SEROQUEL XR) 300 MG 24 hr tablet   Oral   Take 1 tablet (300 mg total) by  mouth at bedtime.   90 tablet   0   . tamsulosin (FLOMAX) 0.4 MG CAPS capsule   Oral   Take 1 capsule by mouth daily.         Marland Kitchen tiotropium (SPIRIVA) 18 MCG inhalation capsule   Inhalation   Place into inhaler and inhale daily.         Marland Kitchen triamcinolone cream (KENALOG) 0.1 %               . venlafaxine XR (EFFEXOR-XR) 150 MG 24 hr capsule   Oral   Take 1 capsule (150 mg total) by mouth daily.   90 capsule   0   . zolpidem (AMBIEN) 10 MG tablet   Oral   Take 1 tablet (10 mg total) by mouth at bedtime.   30 tablet   2     Allergies Penicillins  Family History  Problem Relation Age of Onset  . COPD Mother   . Lung cancer Father     Social History Social History  Substance Use Topics  . Smoking status: Former Research scientist (life sciences)  . Smokeless tobacco: Never Used     Comment: quit 18 years ago  . Alcohol Use: No    Review of Systems Constitutional: No fever/chills Eyes: No visual changes. ENT: No sore throat. Cardiovascular: Denies chest pain. Respiratory: Denies shortness of breath. Gastrointestinal: No abdominal pain.  No nausea, no vomiting.  No diarrhea.  Positive for constipation. Genitourinary: Negative for dysuria. Musculoskeletal: Negative for back pain. Skin: Negative for rash. Neurological: Negative for headaches, focal weakness or numbness.  10-point ROS otherwise negative.  ____________________________________________   PHYSICAL EXAM:  VITAL SIGNS: ED Triage Vitals  Enc Vitals Group     BP 01/12/15 0024 145/67 mmHg     Pulse Rate 01/12/15 0024 94     Resp 01/12/15 0024 22     Temp 01/12/15 0024 97.8 F (36.6 C)     Temp Source 01/12/15 0024 Oral     SpO2 01/12/15 0024 95 %     Weight 01/12/15 0024 135 lb (61.236 kg)     Height 01/12/15 0024 '5\' 7"'$  (1.702 m)     Head Cir --      Peak Flow --      Pain Score 01/12/15 0441 2     Pain Loc --      Pain Edu? --  Excl. in [place]? --     Constitutional: Alert and oriented. Well appearing and  in mild acute distress. Eyes: Conjunctivae are normal. PERRL. EOMI. Head: Atraumatic. Nose: No congestion/rhinnorhea. Mouth/Throat: Mucous membranes are moist.  Oropharynx non-erythematous. Neck: No stridor.   Cardiovascular: Normal rate, regular rhythm. Grossly normal heart sounds.  Good peripheral circulation. Respiratory: Normal respiratory effort.  No retractions. Lungs CTAB. Gastrointestinal: Soft and nontender. Decreased bowel sounds. No distention. No abdominal bruits. No CVA tenderness. Musculoskeletal: No lower extremity tenderness nor edema.  No joint effusions. Neurologic:  Normal speech and language. No gross focal neurologic deficits are appreciated. No gait instability. Skin:  Skin is warm, dry and intact. No rash noted. Psychiatric: Mood and affect are normal. Speech and behavior are normal.  ____________________________________________   LABS (all labs ordered are listed, but only abnormal results are displayed)  Labs Reviewed - No data to display ____________________________________________  EKG  none ____________________________________________  RADIOLOGY  Abdominal x-ray (viewed by me, interpreted per Dr. Pascal Lux):  Large stool burden correlating with clinical history. No indication of bowel obstruction. ____________________________________________   PROCEDURES  Procedure(s) performed:  Rectal exam: No fecal impaction palpated. Minimal stool in rectal vault.  ------------------------------------------------------------------------------------------------------------------- Fecal Disimpaction Procedure Note:  Performed by me:  Patient placed in the lateral recumbent position with knees drawn towards chest. Nurse present for patient support. Minimal amount of semi-hard brown stool removed. No complications during procedure.   ------------------------------------------------------------------------------------------------------------------  Critical  Care performed: None  ____________________________________________   INITIAL IMPRESSION / ASSESSMENT AND PLAN / ED COURSE  Pertinent labs & imaging results that were available during my care of the patient were reviewed by me and considered in my medical decision making (see chart for details).  81 year old male who presents with constipation, no associated nausea, vomiting or abdominal pain. KUB demonstrates large stool burden without obstruction. Will administer soapsuds enema and reassess.  ----------------------------------------- 6:15 AM on 01/12/2015 -----------------------------------------  Patient was unable to hold soapsuds enema for very long. No bowel movement yet. Demanding we "taken out". Explained again to patient that there was no fecal impaction and therefore nothing to disimpact. Will attempt mineral oil enema.  ----------------------------------------- 7:25 AM on 01/12/2015 -----------------------------------------  Patient only sat on the toilet for 2 minutes after mineral oil enema, then returned to his bed. Anxious, stating "it's not going to work". Demanding to be admitted to the hospital. Demanding we call his GI doctor to "take him out". Then immediately demanding to go home. Reports he regularly takes lorazepam for anxiety. Reassured patient that he has over 1 week's worth of stool in his colon and he will not expel it all at once. Will check basic labs, initiate gentle IV hydration, low dose IV anxiolytic for patient to relax and re-attempt bowel movement. Care transferred to Dr. Jacqualine Code  pending labs and reassessment. ____________________________________________   FINAL CLINICAL IMPRESSION(S) / ED DIAGNOSES  Final diagnoses:  Constipation, unspecified constipation type      Paulette Blanch, MD 01/12/15 [phone]

## 2015-01-12 NOTE — ED Notes (Signed)
MD at bedside. 

## 2015-01-12 NOTE — ED Notes (Signed)
Patient ambulatory to triage with steady gait, without difficulty or distress noted; pt reports constipation x week; using OTC meds without relief; st hx of same; pt denies N/V or abd pain

## 2015-01-12 NOTE — ED Notes (Addendum)
Pt able to tolerate half of the fluid in enema bag; up to BR with no results; back onto bed and the rest of the enema given to pt;

## 2015-01-12 NOTE — ED Notes (Signed)
Pt had small loose stool.

## 2015-01-12 NOTE — ED Notes (Signed)
Pt with no results after sitting on toilet for 2 minutes; keeps saying "It's not going to work"; anxious;

## 2015-01-12 NOTE — ED Notes (Signed)
Pt again with no results; MD aware; verbal order for mineral oil enema

## 2015-01-20 ENCOUNTER — Inpatient Hospital Stay (HOSPITAL_BASED_OUTPATIENT_CLINIC_OR_DEPARTMENT_OTHER): Payer: Medicare HMO | Admitting: Oncology

## 2015-01-20 ENCOUNTER — Encounter: Payer: Self-pay | Admitting: Oncology

## 2015-01-20 ENCOUNTER — Inpatient Hospital Stay: Payer: Medicare HMO | Attending: Oncology

## 2015-01-20 VITALS — BP 118/65 | HR 103 | Temp 95.4°F | Wt 148.6 lb

## 2015-01-20 DIAGNOSIS — Z79899 Other long term (current) drug therapy: Secondary | ICD-10-CM | POA: Insufficient documentation

## 2015-01-20 DIAGNOSIS — J45909 Unspecified asthma, uncomplicated: Secondary | ICD-10-CM | POA: Diagnosis not present

## 2015-01-20 DIAGNOSIS — Z9221 Personal history of antineoplastic chemotherapy: Secondary | ICD-10-CM | POA: Insufficient documentation

## 2015-01-20 DIAGNOSIS — C3412 Malignant neoplasm of upper lobe, left bronchus or lung: Secondary | ICD-10-CM

## 2015-01-20 DIAGNOSIS — J449 Chronic obstructive pulmonary disease, unspecified: Secondary | ICD-10-CM | POA: Diagnosis not present

## 2015-01-20 DIAGNOSIS — K5900 Constipation, unspecified: Secondary | ICD-10-CM | POA: Diagnosis not present

## 2015-01-20 DIAGNOSIS — C3492 Malignant neoplasm of unspecified part of left bronchus or lung: Secondary | ICD-10-CM

## 2015-01-20 DIAGNOSIS — R079 Chest pain, unspecified: Secondary | ICD-10-CM | POA: Insufficient documentation

## 2015-01-20 DIAGNOSIS — Z87891 Personal history of nicotine dependence: Secondary | ICD-10-CM | POA: Diagnosis not present

## 2015-01-20 DIAGNOSIS — Z923 Personal history of irradiation: Secondary | ICD-10-CM | POA: Insufficient documentation

## 2015-01-20 LAB — CBC WITH DIFFERENTIAL/PLATELET
BASOS PCT: 1 %
Basophils Absolute: 0 10*3/uL (ref 0–0.1)
EOS ABS: 0.1 10*3/uL (ref 0–0.7)
Eosinophils Relative: 2 %
HCT: 31.7 % — ABNORMAL LOW (ref 40.0–52.0)
HEMOGLOBIN: 10.6 g/dL — AB (ref 13.0–18.0)
Lymphocytes Relative: 16 %
Lymphs Abs: 0.9 10*3/uL — ABNORMAL LOW (ref 1.0–3.6)
MCH: 31.3 pg (ref 26.0–34.0)
MCHC: 33.4 g/dL (ref 32.0–36.0)
MCV: 93.6 fL (ref 80.0–100.0)
MONOS PCT: 11 %
Monocytes Absolute: 0.6 10*3/uL (ref 0.2–1.0)
NEUTROS PCT: 70 %
Neutro Abs: 4 10*3/uL (ref 1.4–6.5)
Platelets: 192 10*3/uL (ref 150–440)
RBC: 3.39 MIL/uL — ABNORMAL LOW (ref 4.40–5.90)
RDW: 17.4 % — ABNORMAL HIGH (ref 11.5–14.5)
WBC: 5.7 10*3/uL (ref 3.8–10.6)

## 2015-01-20 LAB — COMPREHENSIVE METABOLIC PANEL
ALBUMIN: 3.2 g/dL — AB (ref 3.5–5.0)
ALK PHOS: 68 U/L (ref 38–126)
ALT: 11 U/L — ABNORMAL LOW (ref 17–63)
ANION GAP: 7 (ref 5–15)
AST: 15 U/L (ref 15–41)
BUN: 18 mg/dL (ref 6–20)
CALCIUM: 8.6 mg/dL — AB (ref 8.9–10.3)
CO2: 30 mmol/L (ref 22–32)
Chloride: 99 mmol/L — ABNORMAL LOW (ref 101–111)
Creatinine, Ser: 0.88 mg/dL (ref 0.61–1.24)
GFR calc Af Amer: >60 mL/min (ref >60)
GFR calc non Af Amer: >60 mL/min (ref >60)
GLUCOSE: 127 mg/dL — AB (ref 65–99)
POTASSIUM: 3.8 mmol/L (ref 3.5–5.1)
SODIUM: 136 mmol/L (ref 135–145)
Total Bilirubin: 0.4 mg/dL (ref 0.3–1.2)
Total Protein: 7.4 g/dL (ref 6.5–8.1)

## 2015-01-20 NOTE — Progress Notes (Signed)
[place] @ [facility] [phone]  Fax:([phone]) [place]: 14-Aug-1929  MR#: [id number]  LOV#:[id number]  Patient Care Team: Albina Billet, MD as PCP - General (Internal Medicine)  CHIEF COMPLAINT:  Chief Complaint  Patient presents with  . OTHER    Oncology History   1.  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 Further staging workup with PET scan pending (diagnosis based on  CT scan ) July, 2016 2. SB RT for a T1 lesion of the left lung superior segment left upper lobe Biopsy was consistent with non-small cell carcinoma of lung in February of 2015 3.  Started radiation and chemotherapy for left upper lobe recurrent disease versus second primary from November 10, 2014   4.  Patient has finished total 6 weekly cycle of carboplatinum and Taxol on December 15, 2014 Will   Be finished   With radiation therapy by December 21, 2014     Oncology Flowsheet 11/17/2014 11/24/2014 12/01/2014 12/08/2014 12/15/2014 12/17/2014 01/12/2015  Day, Cycle Day 1, Cycle 2 Day 1, Cycle 3 Day 1, Cycle 4 Day 1, 5 Day 1, 5 - -  CARBOplatin (PARAPLATIN) IV 160 mg 160 mg 160 mg 160 mg 160 mg - -  dexamethasone (DECADRON) IV [ 20 mg ] [ 20 mg ] 20 mg 20 mg - 10 mg -  enoxaparin (LOVENOX) [place] - - - - - - -  LORazepam (ATIVAN) IV - - - - - - 0.5 mg  LORazepam (ATIVAN) PO - - - - - - -  ondansetron (ZOFRAN) IV [ 16 mg ] [ 16 mg ] - - [ 8 mg ] - -  PACLitaxel (TAXOL) IV 45 mg/m2 45 mg/m2 45 mg/m2 45 mg/m2 45 mg/m2 - -  prochlorperazine (COMPAZINE) PO - - 10 mg 10 mg - - -    INTERVAL HISTORY: 81 year old gentleman was started on chemotherapy with platinum and Taxol starting from July of 2016. GENERAL:  Feeling somewhat weak and tired.  Patient is gradually improving.  Was in emergency room with a history of constipation all the records and x-ray of the abdomen has been reviewed from emergency room.  Constipation is improved patient is taking MiraLAX and stool softener on  regular basis.  No cough.  No shortness of breath.  No hemoptysis.  Extremely anxious individual.  He here for further follow-up regarding carcinoma of lung locally advanced unresectable disease.Marland Kitchen PERFORMANCE STATUS (ECOG):01 HEENT:  No visual changes, runny nose, sore throat, mouth sores or tenderness. Lungs: Dry hacking cough.  No hemoptysis. Cardiac:  No chest pain, palpitations, orthopnea, or PND. GI:  No nausea, vomiting, diarrhea, constipation, melena or hematochezia. GU:  No urgency, frequency, dysuria, or hematuria. Musculoskeletal:  Left shoulder pain Extremities:  No pain or swelling. Skin:  No rashes or skin changes. Neuro:  No headache, numbness or weakness, balance or coordination issues. Endocrine:  No diabetes, thyroid issues, hot flashes or night sweats. Psych:  No mood changes, depression or anxiety. Pain:  No focal pain. Review of systems:  All other systems reviewed and found to be negative. As per HPI. Otherwise, a complete review of systems is negatve.  PAST MEDICAL HISTORY: Past Medical History  Diagnosis Date  . Major depressive disorder, recurrent, severe with psychotic features ([place])   . Persistent disorder of initiating or maintaining sleep   . Generalized anxiety disorder   . Major depressive disorder, recurrent, severe with psychotic features ([place])   .  Persistent disorder of initiating or maintaining sleep   . Asthma   . Cancer ([place])     Left lung  . COPD (chronic obstructive pulmonary disease) ([place])     PAST SURGICAL HISTORY: Past Surgical History  Procedure Laterality Date  . Hemorroidectomy    . Lung surgery      FAMILY HISTORY Family History  Problem Relation Age of Onset  . COPD Mother   . Lung cancer Father     ADVANCED DIRECTIVES:  Patient does have advance healthcare directive, Patient   does not desire to make any changes HEALTH MAINTENANCE: Social History  Substance Use Topics  . Smoking status: Former Research scientist (life sciences)  . Smokeless  tobacco: Never Used     Comment: quit 18 years ago  . Alcohol Use: No      Allergies  Allergen Reactions  . Penicillins     Current Outpatient Prescriptions  Medication Sig Dispense Refill  . albuterol (PROVENTIL HFA;VENTOLIN HFA) 108 (90 BASE) MCG/ACT inhaler Inhale 1-2 puffs into the lungs QID.     Marland Kitchen atorvastatin (LIPITOR) 40 MG tablet Take 1 tablet by mouth daily.    Marland Kitchen econazole nitrate 1 % cream Apply 1 application topically 2 (two) times daily.     Marland Kitchen esomeprazole (NEXIUM) 40 MG capsule Take 40 mg by mouth daily at 12 noon.    Marland Kitchen HYDROcodone-acetaminophen (NORCO/VICODIN) 5-325 MG per tablet Take 1 tablet by mouth every 4 (four) hours as needed for moderate pain. 30 tablet 0  . LORazepam (ATIVAN) 1 MG tablet Take 1 tablet 3 times a day and one half a tablet once a day. 315 tablet 0  . mometasone-formoterol (DULERA) 200-5 MCG/ACT AERO Inhale 2 puffs into the lungs 2 (two) times daily.     . ondansetron (ZOFRAN) 8 MG tablet Take 1 tablet (8 mg total) by mouth 2 (two) times daily. Start the day after chemo for 3 days. Then take as needed for nausea or vomiting. 30 tablet 1  . prochlorperazine (COMPAZINE) 10 MG tablet Take 1 tablet (10 mg total) by mouth every 6 (six) hours as needed (Nausea or vomiting). 30 tablet 1  . QUEtiapine (SEROQUEL XR) 300 MG 24 hr tablet Take 1 tablet (300 mg total) by mouth at bedtime. 90 tablet 0  . tamsulosin (FLOMAX) 0.4 MG CAPS capsule Take 1 capsule by mouth daily.    Marland Kitchen tiotropium (SPIRIVA) 18 MCG inhalation capsule Place into inhaler and inhale daily.    Marland Kitchen venlafaxine XR (EFFEXOR-XR) 150 MG 24 hr capsule Take 1 capsule (150 mg total) by mouth daily. 90 capsule 0  . zolpidem (AMBIEN) 10 MG tablet Take 1 tablet (10 mg total) by mouth at bedtime. 30 tablet 2   No current facility-administered medications for this visit.    OBJECTIVE:  Filed Vitals:   01/20/15 0830  BP: 118/65  Pulse: 103  Temp: 95.4 F (35.2 C)     Body mass index is 23.27 kg/(m^2).     ECOG FS:1 - Symptomatic but completely ambulatory  PHYSICAL EXAM: GENERAL:  Well developed, well nourished, sitting comfortably in the exam room in no acute distress. MENTAL STATUS:  Alert and oriented to person, place and time. HEAD: Alopecia  Normocephalic, atraumatic, face symmetric, no Cushingoid features. EYES:  Pupils equal round and reactive to light and accomodation.  No conjunctivitis or scleral icterus. ENT:  Oropharynx clear without lesion.  Tongue normal. Mucous membranes moist.  RESPIRATORY:  Clear to auscultation without rales, wheezes or rhonchi. CARDIOVASCULAR:  Regular  rate and rhythm without murmur, rub or gallop. BREAST:  Right breast without masses, skin changes or nipple discharge.  Left breast without masses, skin changes or nipple discharge. ABDOMEN:  Soft, non-tender, with active bowel sounds, and no hepatosplenomegaly.  No masses. BACK:  No CVA tenderness.  No tenderness on percussion of the back or rib cage. SKIN:  No rashes, ulcers or lesions. EXTREMITIES: No edema, no skin discoloration or tenderness.  No palpable cords. LYMPH NODES: No palpable cervical, supraclavicular, axillary or inguinal adenopathy  NEUROLOGICAL: Unremarkable. PSYCH:  Appropriate.   LAB RESULTS:  Appointment on 01/20/2015  Component Date Value Ref Range Status  . WBC 01/20/2015 5.7  3.8 - 10.6 K/uL Final  . RBC 01/20/2015 3.39* 4.40 - 5.90 MIL/uL Final  . Hemoglobin 01/20/2015 10.6* 13.0 - 18.0 g/dL Final  . HCT 01/20/2015 31.7* 40.0 - 52.0 % Final  . MCV 01/20/2015 93.6  80.0 - 100.0 fL Final  . MCH 01/20/2015 31.3  26.0 - 34.0 pg Final  . MCHC 01/20/2015 33.4  32.0 - 36.0 g/dL Final  . RDW 01/20/2015 17.4* 11.5 - 14.5 % Final  . Platelets 01/20/2015 192  150 - 440 K/uL Final  . Neutrophils Relative % 01/20/2015 70   Final  . Neutro Abs 01/20/2015 4.0  1.4 - 6.5 K/uL Final  . Lymphocytes Relative 01/20/2015 16   Final  . Lymphs Abs 01/20/2015 0.9* 1.0 - 3.6 K/uL Final  .  Monocytes Relative 01/20/2015 11   Final  . Monocytes Absolute 01/20/2015 0.6  0.2 - 1.0 K/uL Final  . Eosinophils Relative 01/20/2015 2   Final  . Eosinophils Absolute 01/20/2015 0.1  0 - 0.7 K/uL Final  . Basophils Relative 01/20/2015 1   Final  . Basophils Absolute 01/20/2015 0.0  0 - 0.1 K/uL Final  . Sodium 01/20/2015 136  135 - 145 mmol/L Final  . Potassium 01/20/2015 3.8  3.5 - 5.1 mmol/L Final  . Chloride 01/20/2015 99* 101 - 111 mmol/L Final  . CO2 01/20/2015 30  22 - 32 mmol/L Final  . Glucose, Bld 01/20/2015 127* 65 - 99 mg/dL Final  . BUN 01/20/2015 18  6 - 20 mg/dL Final  . Creatinine, Ser 01/20/2015 0.88  0.61 - 1.24 mg/dL Final  . Calcium 01/20/2015 8.6* 8.9 - 10.3 mg/dL Final  . Total Protein 01/20/2015 7.4  6.5 - 8.1 g/dL Final  . Albumin 01/20/2015 3.2* 3.5 - 5.0 g/dL Final  . AST 01/20/2015 15  15 - 41 U/L Final  . ALT 01/20/2015 11* 17 - 63 U/L Final  . Alkaline Phosphatase 01/20/2015 68  38 - 126 U/L Final  . Total Bilirubin 01/20/2015 0.4  0.3 - 1.2 mg/dL Final  . GFR calc non Af Amer 01/20/2015 >60  >60 mL/min Final  . GFR calc Af Amer 01/20/2015 >60  >60 mL/min Final   Comment: (NOTE) The eGFR has been calculated using the CKD EPI equation. This calculation has not been validated in all clinical situations. eGFR's persistently <60 mL/min signify possible Chronic Kidney Disease.   . Anion gap 01/20/2015 7  5 - 15 Final      STUDIES: Guidance study., TP3,BRAF,JAK3,BRCA1 mutated in the blood testing (August, 2016) ASSESSMENT: Recurrent or another primary with left upper lobe invading vertebral body.  Chest pain has improved.  Patient is not taking any pain medication at present time. Constipation has improved patient went to emergency room all the records from emergency room has been reviewed. Proceed with PET scan in  for further evaluation and treatment consideration Patient expressed understanding and was in agreement with this plan. He also  understands that He can call clinic at any time with any questions, concerns, or complaints.    Carcinoma, lung   Staging form: Lung, AJCC 7th Edition     Clinical: Stage IIIA (T4, N0, M0) - Signed by Forest Gleason, MD on 10/29/2014   Forest Gleason, MD   01/20/2015 8:53 AM

## 2015-01-20 NOTE — Progress Notes (Signed)
Patient does not have living will.  Former smoker. 

## 2015-01-24 ENCOUNTER — Emergency Department
Admission: EM | Admit: 2015-01-24 | Discharge: 2015-01-24 | Disposition: A | Discharge status: Home / Self Care | Payer: Medicare HMO | Attending: Student | Admitting: Student

## 2015-01-24 ENCOUNTER — Encounter: Payer: Self-pay | Admitting: *Deleted

## 2015-01-24 DIAGNOSIS — Z880 Allergy status to penicillin: Secondary | ICD-10-CM | POA: Insufficient documentation

## 2015-01-24 DIAGNOSIS — R339 Retention of urine, unspecified: Secondary | ICD-10-CM | POA: Insufficient documentation

## 2015-01-24 DIAGNOSIS — Z87891 Personal history of nicotine dependence: Secondary | ICD-10-CM | POA: Diagnosis not present

## 2015-01-24 DIAGNOSIS — Z79899 Other long term (current) drug therapy: Secondary | ICD-10-CM | POA: Diagnosis not present

## 2015-01-24 LAB — URINALYSIS COMPLETE WITH MICROSCOPIC (ARMC ONLY)
BILIRUBIN URINE: NEGATIVE
GLUCOSE, UA: NEGATIVE mg/dL
KETONES UR: NEGATIVE mg/dL
NITRITE: NEGATIVE
Protein, ur: NEGATIVE mg/dL
SPECIFIC GRAVITY, URINE: 1.012 (ref 1.005–1.030)
pH: 5 (ref 5.0–8.0)

## 2015-01-24 LAB — BASIC METABOLIC PANEL
Anion gap: 7 (ref 5–15)
BUN: 15 mg/dL (ref 6–20)
CHLORIDE: 100 mmol/L — AB (ref 101–111)
CO2: 30 mmol/L (ref 22–32)
Calcium: 8.9 mg/dL (ref 8.9–10.3)
Creatinine, Ser: 0.69 mg/dL (ref 0.61–1.24)
GFR calc Af Amer: >60 mL/min (ref >60)
GFR calc non Af Amer: >60 mL/min (ref >60)
Glucose, Bld: 94 mg/dL (ref 65–99)
POTASSIUM: 4.2 mmol/L (ref 3.5–5.1)
SODIUM: 137 mmol/L (ref 135–145)

## 2015-01-24 MED ORDER — CIPROFLOXACIN HCL 500 MG PO TABS: 500.0000 mg | ORAL_TABLET | Freq: Two times a day (BID) | ORAL | Status: AC

## 2015-01-24 NOTE — ED Provider Notes (Signed)
[facility] Emergency Department Provider Note  ____________________________________________  Time seen: Approximately 4:52 AM  I have reviewed the triage vital signs and the nursing notes.   HISTORY  Chief Complaint Urinary Retention    HPI Timothy Ali is a 81 y.o. male with history of stage IV lung cancer, COPD, BPH with bladder outlet obstruction requiring self-catheterization who presents for evaluation of acute urinary retention, gradual onset last night, constant since onset and severe. The patient reports that he usually self catheterizes every few hours. He attempted to do this yesterday evening and was only able to retrieve blood. He waited several hours and re-attempted self-catheterization however was unable to get any urine. He denies any abdominal discomfort, no nausea, vomiting, diarrhea, fevers or chills. Severity is moderate. No modifying factors. No chest pain or difficulty breathing.   Past Medical History  Diagnosis Date  . Major depressive disorder, recurrent, severe with psychotic features ([place])   . Persistent disorder of initiating or maintaining sleep   . Generalized anxiety disorder   . Major depressive disorder, recurrent, severe with psychotic features ([place])   . Persistent disorder of initiating or maintaining sleep   . Asthma   . Cancer ([place])     Left lung  . COPD (chronic obstructive pulmonary disease) [facility])     Patient Active Problem List   Diagnosis Date Noted  . Lung cancer ([place]) 10/24/2014  . Acute on chronic respiratory failure ([place]) 10/23/2014  . Leucocytosis 10/23/2014  . COPD (chronic obstructive pulmonary disease) ([place]) 10/23/2014  . Carcinoma, lung ([place]) 10/23/2014  . CAFL (chronic airflow limitation) ([place]) 08/01/2014  . Depression, major, recurrent, severe with psychosis ([place]) 08/01/2014  . H/O gastrointestinal disease 08/01/2014  . Anxiety, generalized 08/01/2014  . H/O elevated lipids 08/01/2014  .  Insomnia, persistent 08/01/2014  . H/O malignant neoplasm 08/01/2014  . Prostate disease 08/01/2014  . Macular degeneration 08/01/2014  . Obstruction of urinary tract 08/01/2014    Past Surgical History  Procedure Laterality Date  . Hemorroidectomy    . Lung surgery      Current Outpatient Rx  Name  Route  Sig  Dispense  Refill  . albuterol (PROVENTIL HFA;VENTOLIN HFA) 108 (90 BASE) MCG/ACT inhaler   Inhalation   Inhale 1-2 puffs into the lungs QID.          Marland Kitchen atorvastatin (LIPITOR) 40 MG tablet   Oral   Take 1 tablet by mouth daily.         . ciprofloxacin (CIPRO) 500 MG tablet   Oral   Take 1 tablet (500 mg total) by mouth 2 (two) times daily.   14 tablet   0   . econazole nitrate 1 % cream   Topical   Apply 1 application topically 2 (two) times daily.          Marland Kitchen esomeprazole (NEXIUM) 40 MG capsule   Oral   Take 40 mg by mouth daily at 12 noon.         Marland Kitchen HYDROcodone-acetaminophen (NORCO/VICODIN) 5-325 MG per tablet   Oral   Take 1 tablet by mouth every 4 (four) hours as needed for moderate pain.   30 tablet   0   . LORazepam (ATIVAN) 1 MG tablet      Take 1 tablet 3 times a day and one half a tablet once a day.   315 tablet   0   . mometasone-formoterol (DULERA) 200-5 MCG/ACT AERO   Inhalation   Inhale 2 puffs  into the lungs 2 (two) times daily.          . ondansetron (ZOFRAN) 8 MG tablet   Oral   Take 1 tablet (8 mg total) by mouth 2 (two) times daily. Start the day after chemo for 3 days. Then take as needed for nausea or vomiting.   30 tablet   1   . prochlorperazine (COMPAZINE) 10 MG tablet   Oral   Take 1 tablet (10 mg total) by mouth every 6 (six) hours as needed (Nausea or vomiting).   30 tablet   1   . QUEtiapine (SEROQUEL XR) 300 MG 24 hr tablet   Oral   Take 1 tablet (300 mg total) by mouth at bedtime.   90 tablet   0   . tamsulosin (FLOMAX) 0.4 MG CAPS capsule   Oral   Take 1 capsule by mouth daily.         Marland Kitchen  tiotropium (SPIRIVA) 18 MCG inhalation capsule   Inhalation   Place into inhaler and inhale daily.         Marland Kitchen venlafaxine XR (EFFEXOR-XR) 150 MG 24 hr capsule   Oral   Take 1 capsule (150 mg total) by mouth daily.   90 capsule   0   . zolpidem (AMBIEN) 10 MG tablet   Oral   Take 1 tablet (10 mg total) by mouth at bedtime.   30 tablet   2     Allergies Penicillins  Family History  Problem Relation Age of Onset  . COPD Mother   . Lung cancer Father     Social History Social History  Substance Use Topics  . Smoking status: Former Research scientist (life sciences)  . Smokeless tobacco: Never Used     Comment: quit 18 years ago  . Alcohol Use: No    Review of Systems Constitutional: No fever/chills Eyes: No visual changes. ENT: No sore throat. Cardiovascular: Denies chest pain. Respiratory: Denies shortness of breath. Gastrointestinal: No abdominal pain.  No nausea, no vomiting.  No diarrhea.  No constipation. Genitourinary: Negative for dysuria. Musculoskeletal: Negative for back pain. Skin: Negative for rash. Neurological: Negative for headaches, focal weakness or numbness.  10-point ROS otherwise negative.  ____________________________________________   PHYSICAL EXAM:  VITAL SIGNS: ED Triage Vitals  Enc Vitals Group     BP 01/24/15 0149 137/72 mmHg     Pulse Rate 01/24/15 0149 91     Resp 01/24/15 0149 22     Temp 01/24/15 0149 97.5 F (36.4 C)     Temp Source 01/24/15 0149 Oral     SpO2 01/24/15 0149 92 %     Weight 01/24/15 0149 140 lb (63.504 kg)     Height 01/24/15 0149 '5\' 7"'$  (1.702 m)     Head Cir --      Peak Flow --      Pain Score --      Pain Loc --      Pain Edu? --      Excl. in [place]? --     Constitutional: Alert and oriented. Well appearing and in no acute distress. Eyes: Conjunctivae are normal. PERRL. EOMI. Head: Atraumatic. Nose: No congestion/rhinnorhea. Mouth/Throat: Mucous membranes are moist.  Oropharynx non-erythematous. Neck: No stridor.   Cardiovascular: Normal rate, regular rhythm. Grossly normal heart sounds.  Good peripheral circulation. Respiratory: Normal respiratory effort.  No retractions. Lungs CTAB. Gastrointestinal: Soft and nontender. No distention. No abdominal bruits. No CVA tenderness. Genitourinary: deferred Musculoskeletal: No lower extremity tenderness nor edema.  No joint effusions. Neurologic:  Normal speech and language. No gross focal neurologic deficits are appreciated. No gait instability. Skin:  Skin is warm, dry and intact. No rash noted. Psychiatric: Mood and affect are normal. Speech and behavior are normal.  ____________________________________________   LABS (all labs ordered are listed, but only abnormal results are displayed)  Labs Reviewed  URINALYSIS COMPLETEWITH MICROSCOPIC ([place] ONLY) - Abnormal; Notable for the following:    Color, Urine YELLOW (*)    APPearance HAZY (*)    Hgb urine dipstick 2+ (*)    Leukocytes, UA 3+ (*)    Bacteria, UA RARE (*)    Squamous Epithelial / LPF 0-5 (*)    All other components within normal limits  BASIC METABOLIC PANEL - Abnormal; Notable for the following:    Chloride 100 (*)    All other components within normal limits  URINE CULTURE   ____________________________________________  EKG  none ____________________________________________  RADIOLOGY  none ____________________________________________   PROCEDURES  Procedure(s) performed: None  Critical Care performed: No  ____________________________________________   INITIAL IMPRESSION / ASSESSMENT AND PLAN / ED COURSE  Pertinent labs & imaging results that were available during my care of the patient were reviewed by me and considered in my medical decision making (see chart for details).  JHEREMY BOGER is a 81 y.o. male with history of stage IV lung cancer, COPD, BPH with bladder outlet obstruction requiring self-catheterization who presents for evaluation of acute urinary  retention, gradual onset last night, constant since onset and severe. On exam, he is really well-appearing and in no acute distress. Vital signs stable, he is afebrile. Foley catheter was easily placed by nursing staff and the patient now has a Foley in place which is draining clear yellow urine. He reports he feels well at this time. He has no complaints. BMP shows normal creatinine is 0.69. Urinalysis with 3+ leuk esterase, 6-30 red blood cells and many white blood cells, rare bacteria. Will start on ciprofloxacin. Will order urine culture. We discussed return precautions, need for close urology follow-up for trial void and he is comfortable with discharge plan. ____________________________________________   FINAL CLINICAL IMPRESSION(S) / ED DIAGNOSES  Final diagnoses:  Urinary retention      Joanne Gavel, MD 01/24/15 [id number]

## 2015-01-24 NOTE — ED Notes (Signed)
MD at bedside for eval.

## 2015-01-24 NOTE — ED Notes (Addendum)
Pt reports he self caths and now getting blood out when cathing self.  Pt denies any pain.  Pt has lung cancer.  Recent chemo and radiation

## 2015-01-26 ENCOUNTER — Ambulatory Visit: Payer: Self-pay

## 2015-01-26 DIAGNOSIS — Z978 Presence of other specified devices: Secondary | ICD-10-CM

## 2015-01-26 DIAGNOSIS — Z960 Presence of urogenital implants: Principal | ICD-10-CM

## 2015-01-26 LAB — URINE CULTURE: Culture: >=100000

## 2015-01-26 NOTE — Progress Notes (Signed)
Pt came in today requesting foley cath to be removed. Pt was seen in the ER over the weekend for urinary retention. Per pt he noted blood when performing CIC and no urine and that is when he went to the ER. Due to pt performing CIC and ER putting him on cipro Herbert Moors, NP ordered foley to be removed. Reinforced with pt if he develops f/c, n/v, pain or not able to cath himself when he gets home to come back to the office. Pt voiced understanding.

## 2015-01-27 ENCOUNTER — Other Ambulatory Visit: Payer: Self-pay

## 2015-01-27 NOTE — Telephone Encounter (Signed)
pt states he good that he got enoiugh medications

## 2015-01-27 NOTE — Telephone Encounter (Signed)
pt need refill pt last seen on  01-05-15  next appt  03-04-15

## 2015-01-29 ENCOUNTER — Ambulatory Visit
Admission: RE | Admit: 2015-01-29 | Discharge: 2015-01-29 | Disposition: A | Discharge status: Home / Self Care | Payer: Medicare HMO | Source: Ambulatory Visit | Attending: Oncology | Admitting: Oncology

## 2015-01-29 DIAGNOSIS — Z85118 Personal history of other malignant neoplasm of bronchus and lung: Secondary | ICD-10-CM | POA: Insufficient documentation

## 2015-01-29 DIAGNOSIS — C3492 Malignant neoplasm of unspecified part of left bronchus or lung: Secondary | ICD-10-CM

## 2015-01-29 DIAGNOSIS — J439 Emphysema, unspecified: Secondary | ICD-10-CM | POA: Diagnosis not present

## 2015-01-29 DIAGNOSIS — Z08 Encounter for follow-up examination after completed treatment for malignant neoplasm: Secondary | ICD-10-CM | POA: Insufficient documentation

## 2015-01-29 LAB — GLUCOSE, CAPILLARY: Glucose-Capillary: 79 mg/dL (ref 65–99)

## 2015-01-29 MED ORDER — FLUDEOXYGLUCOSE F - 18 (FDG) INJECTION
12.4900 | Freq: Once | INTRAVENOUS | Status: DC | PRN
  Administered 2015-01-29: 12.49 via INTRAVENOUS
  Filled 2015-01-29: qty 12.49

## 2015-01-30 ENCOUNTER — Ambulatory Visit
Admission: RE | Admit: 2015-01-30 | Discharge: 2015-01-30 | Disposition: A | Discharge status: Home / Self Care | Payer: Medicare HMO | Source: Ambulatory Visit | Attending: Radiation Oncology | Admitting: Radiation Oncology

## 2015-01-30 ENCOUNTER — Encounter: Payer: Self-pay | Admitting: Radiation Oncology

## 2015-01-30 VITALS — BP 129/76 | HR 87 | Temp 96.1°F | Resp 20 | Wt 151.3 lb

## 2015-01-30 DIAGNOSIS — C3492 Malignant neoplasm of unspecified part of left bronchus or lung: Secondary | ICD-10-CM

## 2015-01-30 NOTE — Progress Notes (Signed)
Radiation Oncology Follow up Note  Name: Timothy Ali   Date:   01/30/2015 MRN:  [id number] DOB: 1929/09/29    This 81 y.o. male presents to the clinic today for follow-up for lung cancer.  REFERRING PROVIDER: Albina Billet, MD  HPI: Patient is a 81 year old male previously completed SB RT radiation therapy for a T1 lesion of the left lung superior segment of the left upper lobe close to 2 years prior. Developed a new lesion abutting the thoracic spinal column eroding bone and close proximity to the spinal Canal which is that with concurrent chemotherapy and radiation therapy. He is now 1 month out.Marland Kitchen He is doing well. His pain has improved. No cough hemoptysis or chest tightness had a PET CT this week shows excellent response to tumor with decreased hypermetabolic activity and shrinkage of size. He still has some right lower rib pain not certain of the etiology of that.  COMPLICATIONS OF TREATMENT: none  FOLLOW UP COMPLIANCE: keeps appointments   PHYSICAL EXAM:  BP 129/76 mmHg  Pulse 87  Temp(Src) 96.1 F (35.6 C)  Resp 20  Wt 151 lb 5.5 oz (68.65 kg) Elderly male in NAD. Point tenderness in the right lower rib cage does elicit some pain may be referred pain from his spinal lesion. Well-developed well-nourished patient in NAD. HEENT reveals PERLA, EOMI, discs not visualized.  Oral cavity is clear. No oral mucosal lesions are identified. Neck is clear without evidence of cervical or supraclavicular adenopathy. Lungs are clear to A&P. Cardiac examination is essentially unremarkable with regular rate and rhythm without murmur rub or thrill. Abdomen is benign with no organomegaly or masses noted. Motor sensory and DTR levels are equal and symmetric in the upper and lower extremities. Cranial nerves II through XII are grossly intact. Proprioception is intact. No peripheral adenopathy or edema is identified. No motor or sensory levels are noted. Crude visual fields are within normal  range.  RADIOLOGY RESULTS: PET CT scan is reviewed compatible with the above-stated findings  PLAN: At the present time he is doing well is had an excellent response to combined modality treatment. I am please was overall progress. I've asked to see him back in 4-5 months for follow-up. He continues close follow-up care with medical oncology.  I would like to take this opportunity for allowing me to participate in the care of your patient.Armstead Peaks., MD

## 2015-02-02 ENCOUNTER — Inpatient Hospital Stay (HOSPITAL_BASED_OUTPATIENT_CLINIC_OR_DEPARTMENT_OTHER): Payer: Medicare HMO | Admitting: Oncology

## 2015-02-02 ENCOUNTER — Telehealth: Payer: Self-pay | Admitting: Psychiatry

## 2015-02-02 ENCOUNTER — Encounter: Payer: Self-pay | Admitting: Oncology

## 2015-02-02 VITALS — BP 138/71 | HR 100 | Temp 94.7°F | Wt 148.8 lb

## 2015-02-02 DIAGNOSIS — Z9221 Personal history of antineoplastic chemotherapy: Secondary | ICD-10-CM

## 2015-02-02 DIAGNOSIS — Z79899 Other long term (current) drug therapy: Secondary | ICD-10-CM

## 2015-02-02 DIAGNOSIS — C3412 Malignant neoplasm of upper lobe, left bronchus or lung: Secondary | ICD-10-CM | POA: Diagnosis not present

## 2015-02-02 DIAGNOSIS — C3492 Malignant neoplasm of unspecified part of left bronchus or lung: Secondary | ICD-10-CM

## 2015-02-02 DIAGNOSIS — Z923 Personal history of irradiation: Secondary | ICD-10-CM

## 2015-02-02 NOTE — Progress Notes (Signed)
[place] @ [facility] Telephone:(336) [phone]  Fax:(336) [place]: 1930/01/05  MR#: [id number]  ZTI#:[id number]  Patient Care Team: Albina Billet, MD as PCP - General (Internal Medicine)  CHIEF COMPLAINT:  Chief Complaint  Patient presents with  . OTHER    Oncology History   1.  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 Further staging workup with PET scan pending (diagnosis based on  CT scan ) July, 2016 2. SB RT for a T1 lesion of the left lung superior segment left upper lobe Biopsy was consistent with non-small cell carcinoma of lung in February of 2015 3.  Started radiation and chemotherapy for left upper lobe recurrent disease versus second primary from November 10, 2014   4.  Patient has finished total 6 weekly cycle of carboplatinum and Taxol on December 15, 2014 Will   Be finished   With radiation therapy by December 21, 2014      Oncology Flowsheet 11/17/2014 11/24/2014 12/01/2014 12/08/2014 12/15/2014 12/17/2014 01/12/2015  Day, Cycle Day 1, Cycle 2 Day 1, Cycle 3 Day 1, Cycle 4 Day 1, 5 Day 1, 5 - -  CARBOplatin (PARAPLATIN) IV 160 mg 160 mg 160 mg 160 mg 160 mg - -  dexamethasone (DECADRON) IV [ 20 mg ] [ 20 mg ] 20 mg 20 mg - 10 mg -  enoxaparin (LOVENOX) [place] - - - - - - -  LORazepam (ATIVAN) IV - - - - - - 0.5 mg  LORazepam (ATIVAN) PO - - - - - - -  ondansetron (ZOFRAN) IV [ 16 mg ] [ 16 mg ] - - [ 8 mg ] - -  PACLitaxel (TAXOL) IV 45 mg/m2 45 mg/m2 45 mg/m2 45 mg/m2 45 mg/m2 - -  prochlorperazine (COMPAZINE) PO - - 10 mg 10 mg - - -    INTERVAL HISTORY: 81 year old gentleman was started on chemotherapy with platinum and Taxol starting from July of 2016.  Patient is here for ongoing evaluation and treatment consideration.  Appetite is improved.  Chest pain is improved.  No nausea.  No vomiting.  No diarrhea.  Had a repeat PET scan done which has been independently reviewed and reviewed with the patient.   PERFORMANCE STATUS  (ECOG):01 HEENT:  No visual changes, runny nose, sore throat, mouth sores or tenderness. Lungs: Dry hacking cough.  No hemoptysis. Cardiac:  No chest pain, palpitations, orthopnea, or PND. GI:  No nausea, vomiting, diarrhea, constipation, melena or hematochezia. GU:  No urgency, frequency, dysuria, or hematuria. Musculoskeletal:  Left shoulder pain Extremities:  No pain or swelling. Skin:  No rashes or skin changes. Neuro:  No headache, numbness or weakness, balance or coordination issues. Endocrine:  No diabetes, thyroid issues, hot flashes or night sweats. Psych:  No mood changes, depression or anxiety. Pain:  No focal pain. Review of systems:  All other systems reviewed and found to be negative. As per HPI. Otherwise, a complete review of systems is negatve.  PAST MEDICAL HISTORY: Past Medical History  Diagnosis Date  . Major depressive disorder, recurrent, severe with psychotic features ([place])   . Persistent disorder of initiating or maintaining sleep   . Generalized anxiety disorder   . Major depressive disorder, recurrent, severe with psychotic features ([place])   . Persistent disorder of initiating or maintaining sleep   . Asthma   . Cancer ([place])     Left lung  . COPD (chronic obstructive pulmonary disease) ([place])  PAST SURGICAL HISTORY: Past Surgical History  Procedure Laterality Date  . Hemorroidectomy    . Lung surgery      FAMILY HISTORY Family History  Problem Relation Age of Onset  . COPD Mother   . Lung cancer Father     ADVANCED DIRECTIVES:  Patient does have advance healthcare directive, Patient   does not desire to make any changes HEALTH MAINTENANCE: Social History  Substance Use Topics  . Smoking status: Former Research scientist (life sciences)  . Smokeless tobacco: Never Used     Comment: quit 18 years ago  . Alcohol Use: No      Allergies  Allergen Reactions  . Penicillins     Current Outpatient Prescriptions  Medication Sig Dispense Refill  . albuterol  (PROVENTIL HFA;VENTOLIN HFA) 108 (90 BASE) MCG/ACT inhaler Inhale 1-2 puffs into the lungs QID.     Marland Kitchen atorvastatin (LIPITOR) 40 MG tablet Take 1 tablet by mouth daily.    . ciprofloxacin (CIPRO) 500 MG tablet Take 1 tablet (500 mg total) by mouth 2 (two) times daily. 14 tablet 0  . econazole nitrate 1 % cream Apply 1 application topically 2 (two) times daily.     Marland Kitchen esomeprazole (NEXIUM) 40 MG capsule Take 40 mg by mouth daily at 12 noon.    Marland Kitchen HYDROcodone-acetaminophen (NORCO/VICODIN) 5-325 MG per tablet Take 1 tablet by mouth every 4 (four) hours as needed for moderate pain. 30 tablet 0  . LORazepam (ATIVAN) 1 MG tablet Take 1 tablet 3 times a day and one half a tablet once a day. 315 tablet 0  . mometasone-formoterol (DULERA) 200-5 MCG/ACT AERO Inhale 2 puffs into the lungs 2 (two) times daily.     . ondansetron (ZOFRAN) 8 MG tablet Take 1 tablet (8 mg total) by mouth 2 (two) times daily. Start the day after chemo for 3 days. Then take as needed for nausea or vomiting. 30 tablet 1  . Polyethylene Glycol 3350 GRAN     . prochlorperazine (COMPAZINE) 10 MG tablet Take 1 tablet (10 mg total) by mouth every 6 (six) hours as needed (Nausea or vomiting). 30 tablet 1  . QUEtiapine (SEROQUEL XR) 300 MG 24 hr tablet Take 1 tablet (300 mg total) by mouth at bedtime. 90 tablet 0  . tamsulosin (FLOMAX) 0.4 MG CAPS capsule Take 1 capsule by mouth daily.    Marland Kitchen tiotropium (SPIRIVA) 18 MCG inhalation capsule Place into inhaler and inhale daily.    Marland Kitchen venlafaxine XR (EFFEXOR-XR) 150 MG 24 hr capsule Take 1 capsule (150 mg total) by mouth daily. 90 capsule 0  . zolpidem (AMBIEN) 10 MG tablet Take 1 tablet (10 mg total) by mouth at bedtime. 30 tablet 2   No current facility-administered medications for this visit.   Facility-Administered Medications Ordered in Other Visits  Medication Dose Route Frequency Provider Last Rate Last Dose  . fludeoxyglucose F - 18 (FDG) injection 12.49 milli Curie  12.49 milli Curie  Intravenous Once PRN Medication Radiologist, MD   12.49 milli Curie at 01/29/15 0735    OBJECTIVE:  Filed Vitals:   02/02/15 0914  BP: 138/71  Pulse: 100  Temp: 94.7 F (34.8 C)     Body mass index is 23.3 kg/(m^2).    ECOG FS:1 - Symptomatic but completely ambulatory  PHYSICAL EXAM: GENERAL:  Well developed, well nourished, sitting comfortably in the exam room in no acute distress. MENTAL STATUS:  Alert and oriented to person, place and time. HEAD: Alopecia  Normocephalic, atraumatic, face symmetric, no Cushingoid  features. EYES:  Pupils equal round and reactive to light and accomodation.  No conjunctivitis or scleral icterus. ENT:  Oropharynx clear without lesion.  Tongue normal. Mucous membranes moist.  RESPIRATORY:  Clear to auscultation without rales, wheezes or rhonchi. CARDIOVASCULAR:  Regular rate and rhythm without murmur, rub or gallop. BREAST:  Right breast without masses, skin changes or nipple discharge.  Left breast without masses, skin changes or nipple discharge. ABDOMEN:  Soft, non-tender, with active bowel sounds, and no hepatosplenomegaly.  No masses. BACK:  No CVA tenderness.  No tenderness on percussion of the back or rib cage. SKIN:  No rashes, ulcers or lesions. EXTREMITIES: No edema, no skin discoloration or tenderness.  No palpable cords. LYMPH NODES: No palpable cervical, supraclavicular, axillary or inguinal adenopathy  NEUROLOGICAL: Unremarkable. PSYCH:  Appropriate.   LAB RESULTS:  No visits with results within 2 Day(s) from this visit. Latest known visit with results is:  Hospital Outpatient Visit on 01/29/2015  Component Date Value Ref Range Status  . Glucose-Capillary 01/29/2015 79  65 - 99 mg/dL Final      STUDIES: Guidance study., TP3,BRAF,JAK3,BRCA1 mutated in the blood testing (August, 2016) ASSESSMENT: Recurrent or another primary with left upper lobe invading vertebral body.   PET scan has been reviewed independently .shows  improved lung mass.  But still persistent disease Patient is not a candidate for any maintenance therapy under [facility] protocol Possibility of continuing chemotherapy versus immunotherapy can be considered. Send tbiopsy on 2015 for PDL one estimation for [place]   Carcinoma, lung   Staging form: Lung, AJCC 7th Edition    Clinical: Stage IIIA (T4, N0, M0) - Signed by Forest Gleason, MD on 10/29/2014   Forest Gleason, MD   02/02/2015 9:59 AM

## 2015-02-03 ENCOUNTER — Telehealth: Payer: Self-pay | Admitting: *Deleted

## 2015-02-03 ENCOUNTER — Ambulatory Visit (INDEPENDENT_AMBULATORY_CARE_PROVIDER_SITE_OTHER): Payer: Medicare HMO | Admitting: Psychiatry

## 2015-02-03 ENCOUNTER — Encounter: Payer: Self-pay | Admitting: Psychiatry

## 2015-02-03 VITALS — BP 120/78 | HR 97 | Temp 97.6°F | Ht 67.5 in | Wt 149.2 lb

## 2015-02-03 DIAGNOSIS — F333 Major depressive disorder, recurrent, severe with psychotic symptoms: Secondary | ICD-10-CM | POA: Diagnosis not present

## 2015-02-03 DIAGNOSIS — F411 Generalized anxiety disorder: Secondary | ICD-10-CM

## 2015-02-03 DIAGNOSIS — G4700 Insomnia, unspecified: Secondary | ICD-10-CM | POA: Diagnosis not present

## 2015-02-03 NOTE — Patient Instructions (Signed)
Would contact oncologist office for any questions of concerns.

## 2015-02-03 NOTE — Progress Notes (Signed)
BH MD/PA/NP OP Progress Note  02/03/2015 10:35 AM ALTA SHOBER  MRN:  [id number]  Subjective:  Patient presents for follow-up of his generalized anxiety disorder, insomnia and major depressive disorder. He comes to the office today stating he is anxious and worried about his cancer treatment. He indicates he went to the office saw the oncologist in the oncologist stated we will be in touch. It's that he has gotten reports that his cancer status is actually good. However he now worries because he does not know when his next appointment is and is worried about the meaning of that. I explained to him that the best course of action is really for him to contact the office about further follow-up or appointments. He states he does not know what the status of those is and I again encouraged him to call them and ask about upcoming appointments. He seemed to be somewhat comforted with following this advice but was concerned about being intrusive with the office.  I reviewed with him his medication regimen and he indicated he is still taking those medicines and finding them effective. Chief Complaint: "don't know where I stand" in regards to upcoming care for cancer  Chief Complaint    Anxiety; Depression; Medication Problem; Medication Refill     Visit Diagnosis:     ICD-9-CM ICD-10-CM   1. GAD (generalized anxiety disorder) 300.02 F41.1   2. Severe recurrent major depressive disorder with psychotic features ([place]) 296.34 F33.3   3. Insomnia 780.52 G47.00     Past Medical History:  Past Medical History  Diagnosis Date  . Major depressive disorder, recurrent, severe with psychotic features ([place])   . Persistent disorder of initiating or maintaining sleep   . Generalized anxiety disorder   . Major depressive disorder, recurrent, severe with psychotic features ([place])   . Persistent disorder of initiating or maintaining sleep   . Asthma   . Cancer ([place])     Left lung  . COPD (chronic obstructive  pulmonary disease) [facility])     Past Surgical History  Procedure Laterality Date  . Hemorroidectomy    . Lung surgery     Family History:  Family History  Problem Relation Age of Onset  . COPD Mother   . Lung cancer Father    Social History:  Social History   Social History  . Marital Status: Widowed    Spouse Name: N/A  . Number of Children: N/A  . Years of Education: N/A   Social History Main Topics  . Smoking status: Former Research scientist (life sciences)  . Smokeless tobacco: Never Used     Comment: quit 18 years ago  . Alcohol Use: No  . Drug Use: No  . Sexual Activity: No   Other Topics Concern  . None   Social History Narrative   Additional History:   Assessment:   Musculoskeletal: Strength & Muscle Tone: within normal limits Gait & Station: normal Patient leans: N/A  Psychiatric Specialty Exam: Anxiety Symptoms include nervous/anxious behavior. Patient reports no insomnia (doing well with Ambien) or suicidal ideas.    Depression        Associated symptoms include does not have insomnia (doing well with Ambien) and no suicidal ideas.  Past medical history includes anxiety.     Review of Systems  Psychiatric/Behavioral: Negative for depression, suicidal ideas, hallucinations, memory loss and substance abuse. The patient is nervous/anxious. The patient does not have insomnia (doing well with Ambien).   All other systems reviewed and are negative.  Blood pressure 120/78, pulse 97, temperature 97.6 F (36.4 C), temperature source Tympanic, height 5' 7.5" (1.715 m), weight 149 lb 3.2 oz (67.677 kg), SpO2 83 %.Body mass index is 23.01 kg/(m^2).  General Appearance: Neat and Well Groomed  Eye Contact:  Good  Speech:  Clear and Coherent and Normal Rate  Volume:  Normal  Mood:  Worried   Affect:  Anxious  Thought Process:  Linear and Logical  Orientation:  Full (Time, Place, and Person)  Thought Content:  Not verbalizing paranoid ideation as in past visit  Suicidal Thoughts:  No   Homicidal Thoughts:  No  Memory:  Immediate;   Good Recent;   Good Remote;   Good  Judgement:  Good  Insight:  Fair  Psychomotor Activity:  Negative  Concentration:  Good  Recall:  Good  Fund of Knowledge: Good  Language: Good  Akathisia:  Negative  Handed:  Right unknown  AIMS (if indicated):  Not done  Assets:  Desire for Improvement  ADL's:  Intact  Cognition: WNL  Sleep:  5 hour per night   Is the patient at risk to self?  No. Has the patient been a risk to self in the past 6 months?  No. Has the patient been a risk to self within the distant past?  No. Is the patient a risk to others?  No. Has the patient been a risk to others in the past 6 months?  No. Has the patient been a risk to others within the distant past?  No.  Current Medications: Current Outpatient Prescriptions  Medication Sig Dispense Refill  . albuterol (PROVENTIL HFA;VENTOLIN HFA) 108 (90 BASE) MCG/ACT inhaler Inhale 1-2 puffs into the lungs QID.     Marland Kitchen atorvastatin (LIPITOR) 40 MG tablet Take 1 tablet by mouth daily.    . ciprofloxacin (CIPRO) 500 MG tablet Take 1 tablet (500 mg total) by mouth 2 (two) times daily. 14 tablet 0  . econazole nitrate 1 % cream Apply 1 application topically 2 (two) times daily.     Marland Kitchen esomeprazole (NEXIUM) 40 MG capsule Take 40 mg by mouth daily at 12 noon.    Marland Kitchen HYDROcodone-acetaminophen (NORCO/VICODIN) 5-325 MG per tablet Take 1 tablet by mouth every 4 (four) hours as needed for moderate pain. 30 tablet 0  . LORazepam (ATIVAN) 1 MG tablet Take 1 tablet 3 times a day and one half a tablet once a day. 315 tablet 0  . mometasone-formoterol (DULERA) 200-5 MCG/ACT AERO Inhale 2 puffs into the lungs 2 (two) times daily.     . ondansetron (ZOFRAN) 8 MG tablet Take 1 tablet (8 mg total) by mouth 2 (two) times daily. Start the day after chemo for 3 days. Then take as needed for nausea or vomiting. 30 tablet 1  . Polyethylene Glycol 3350 GRAN     . prochlorperazine (COMPAZINE) 10 MG  tablet Take 1 tablet (10 mg total) by mouth every 6 (six) hours as needed (Nausea or vomiting). 30 tablet 1  . QUEtiapine (SEROQUEL XR) 300 MG 24 hr tablet Take 1 tablet (300 mg total) by mouth at bedtime. 90 tablet 0  . tamsulosin (FLOMAX) 0.4 MG CAPS capsule Take 1 capsule by mouth daily.    Marland Kitchen tiotropium (SPIRIVA) 18 MCG inhalation capsule Place into inhaler and inhale daily.    Marland Kitchen venlafaxine XR (EFFEXOR-XR) 150 MG 24 hr capsule Take 1 capsule (150 mg total) by mouth daily. 90 capsule 0  . zolpidem (AMBIEN) 10 MG tablet Take 1 tablet (  10 mg total) by mouth at bedtime. 30 tablet 2   No current facility-administered medications for this visit.   Facility-Administered Medications Ordered in Other Visits  Medication Dose Route Frequency Provider Last Rate Last Dose  . fludeoxyglucose F - 18 (FDG) injection 12.49 milli Curie  12.49 milli Curie Intravenous Once PRN Medication Radiologist, MD   12.49 milli Curie at 01/29/15 0735    Medical Decision Making:  Established Problem, Stable/Improving (1), Review of Medication Regimen & Side Effects (2) and Review of New Medication or Change in Dosage (2)  Treatment Plan Summary:Medication management and Plan    Continue lorazepam 1 mg 3 times a day and a half a milligram once a day. Patient is high resistant to further decrease his and given his most recent stressor we will not decrease his lorazepam at this time. We'll continue him on his Effexor XR at 150 mg daily, we'll continue the Seroquel XR 300 mg at bedtime. Continue his Ambien 10 mg at bedtime as needed.    Major depressive disorder, recurrent severe with psychotic features-stable on Effexor and Seroquel.  Generalized anxiety disorder-we'll continue use the Effexor and keep the lorazepam at the current dosing. Ideally it might be better patient be on lower doses but given his acute stressors will continue the same dose. His anxiety seems to be somewhat worse and because he feels like he is not  clear on follow-up for his oncology treatment. I've instructed him to contact that office and make a couple of attempts. I've also told him that if that is not successful he could go to the office and just inquire about upcoming appointments. Patient seemed to feel some what comforted by this advice.  He will follow-up in 1 months.   Faith Rogue 02/03/2015, 10:35 AM

## 2015-02-03 NOTE — Telephone Encounter (Signed)
Patient called to inquire if we have any information regarding his new treatment.  Informed patient that MD is working on this with protocol nurse and we will contact him as soon as we know if he will be included in this study.  Patient verbalized understanding.

## 2015-02-03 NOTE — Telephone Encounter (Signed)
Patient was seen today. It does not appear there is been any issue with overuse of his lorazepam. It appeared based on what he did that his main issue or concern was side about follow-up plans related to oncology. AW

## 2015-02-03 NOTE — Telephone Encounter (Signed)
pt came into office today was seen by dr. Jimmye Norman

## 2015-02-09 ENCOUNTER — Encounter: Payer: Self-pay | Admitting: Oncology

## 2015-02-11 ENCOUNTER — Ambulatory Visit: Payer: Medicare HMO

## 2015-02-11 DIAGNOSIS — N390 Urinary tract infection, site not specified: Secondary | ICD-10-CM

## 2015-02-11 LAB — URINALYSIS, COMPLETE
BILIRUBIN UA: NEGATIVE
GLUCOSE, UA: NEGATIVE
Ketones, UA: NEGATIVE
NITRITE UA: NEGATIVE
Protein, UA: NEGATIVE
SPEC GRAV UA: 1.01 (ref 1.005–1.030)
Urobilinogen, Ur: 0.2 mg/dL (ref 0.2–1.0)
pH, UA: 6 (ref 5.0–7.5)

## 2015-02-11 LAB — MICROSCOPIC EXAMINATION
BACTERIA UA: NONE SEEN
EPITHELIAL CELLS (NON RENAL): NONE SEEN /HPF (ref 0–10)
Renal Epithel, UA: NONE SEEN /hpf

## 2015-02-11 NOTE — Progress Notes (Signed)
Pt c/o burning, frequency, pressure, and blood when performing CIC. U/a and ucx were ordered.

## 2015-02-13 ENCOUNTER — Telehealth: Payer: Self-pay

## 2015-02-13 LAB — CULTURE, URINE COMPREHENSIVE

## 2015-02-13 NOTE — Telephone Encounter (Signed)
-----   Message from Nori Riis, PA-C sent at 02/13/2015  8:32 AM EDT ----- Patient's urine culture is negative. He will need an office visit because he is overdue for his yearly exam.

## 2015-02-13 NOTE — Telephone Encounter (Signed)
Spoke with pt in reference to ucx results. Made pt aware for the need of a yearly f/u. Pt c/o of still burning. Made pt aware he would need the f/u. Pt was transferred to the front to make f/u.

## 2015-02-13 NOTE — Telephone Encounter (Signed)
Line busy

## 2015-02-16 ENCOUNTER — Encounter: Payer: Self-pay | Admitting: Urology

## 2015-02-16 ENCOUNTER — Ambulatory Visit (INDEPENDENT_AMBULATORY_CARE_PROVIDER_SITE_OTHER): Payer: Medicare HMO | Admitting: Urology

## 2015-02-16 VITALS — BP 148/70 | HR 93 | Ht 67.5 in | Wt 146.7 lb

## 2015-02-16 DIAGNOSIS — R338 Other retention of urine: Principal | ICD-10-CM

## 2015-02-16 DIAGNOSIS — N401 Enlarged prostate with lower urinary tract symptoms: Secondary | ICD-10-CM | POA: Insufficient documentation

## 2015-02-16 DIAGNOSIS — C3490 Malignant neoplasm of unspecified part of unspecified bronchus or lung: Secondary | ICD-10-CM | POA: Diagnosis not present

## 2015-02-16 DIAGNOSIS — N400 Enlarged prostate without lower urinary tract symptoms: Secondary | ICD-10-CM

## 2015-02-16 MED ORDER — FINASTERIDE 5 MG PO TABS: 5.0000 mg | ORAL_TABLET | Freq: Every day | ORAL | Status: DC

## 2015-02-16 NOTE — Progress Notes (Signed)
02/16/2015 8:51 AM   Timothy Ali Nov 25, 1929 [id number]  Referring provider: Albina Billet, MD [address]   [place], [place] [address]  Chief Complaint  Patient presents with  . Urinary Retention    one year follow up  patient having trouble with self cath  . Urethral Stricture    HPI: Patient is an 81 year old white male with BPH with urinary retention who is managing it with [facility] who has stage IV lung cancer who was having difficulties with cathetering himself.  He states for the last 4-5 weeks he's been experiencing bleeding after catheterizing himself. He also states he feels a resistance when he is trying to pass the catheter around the area of his prostate. He is currently on tamsulosin, but he has not been on finasteride.  His recent urine culture was negative for infection.  He is not having suprapubic pain or back pain. He denies fevers, chills, nausea or vomiting.  He is using straight catheters at home.   PMH: Past Medical History  Diagnosis Date  . Major depressive disorder, recurrent, severe with psychotic features ([place])   . Persistent disorder of initiating or maintaining sleep   . Generalized anxiety disorder   . Major depressive disorder, recurrent, severe with psychotic features ([place])   . Persistent disorder of initiating or maintaining sleep   . Asthma   . Cancer ([place])     Left lung  . COPD (chronic obstructive pulmonary disease) [facility])     Surgical History: Past Surgical History  Procedure Laterality Date  . Hemorroidectomy    . Lung surgery      Home Medications:    Medication List       This list is accurate as of: 02/16/15  8:51 AM.  Always use your most recent med list.               albuterol 108 (90 BASE) MCG/ACT inhaler  Commonly known as:  PROVENTIL HFA;VENTOLIN HFA  Inhale 1-2 puffs into the lungs QID.     atorvastatin 40 MG tablet  Commonly known as:  LIPITOR  Take 1 tablet by mouth daily.     econazole nitrate 1 %  cream  Apply 1 application topically 2 (two) times daily.     esomeprazole 40 MG capsule  Commonly known as:  NEXIUM  Take 40 mg by mouth daily at 12 noon.     finasteride 5 MG tablet  Commonly known as:  PROSCAR  Take 1 tablet (5 mg total) by mouth daily.     HYDROcodone-acetaminophen 5-325 MG tablet  Commonly known as:  NORCO/VICODIN  Take 1 tablet by mouth every 4 (four) hours as needed for moderate pain.     LORazepam 1 MG tablet  Commonly known as:  ATIVAN  Take 1 tablet 3 times a day and one half a tablet once a day.     mometasone-formoterol 200-5 MCG/ACT Aero  Commonly known as:  DULERA  Inhale 2 puffs into the lungs 2 (two) times daily.     ondansetron 8 MG tablet  Commonly known as:  ZOFRAN  Take 1 tablet (8 mg total) by mouth 2 (two) times daily. Start the day after chemo for 3 days. Then take as needed for nausea or vomiting.     Polyethylene Glycol 3350 Gran     prochlorperazine 10 MG tablet  Commonly known as:  COMPAZINE  Take 1 tablet (10 mg total) by mouth every 6 (six) hours as needed (  Nausea or vomiting).     QUEtiapine 300 MG 24 hr tablet  Commonly known as:  SEROQUEL XR  Take 1 tablet (300 mg total) by mouth at bedtime.     tamsulosin 0.4 MG Caps capsule  Commonly known as:  FLOMAX  Take 1 capsule by mouth daily.     tiotropium 18 MCG inhalation capsule  Commonly known as:  [place] into inhaler and inhale daily.     venlafaxine XR 150 MG 24 hr capsule  Commonly known as:  EFFEXOR-XR  Take 1 capsule (150 mg total) by mouth daily.     zolpidem 10 MG tablet  Commonly known as:  AMBIEN  Take 1 tablet (10 mg total) by mouth at bedtime.        Allergies:  Allergies  Allergen Reactions  . Penicillins     Family History: Family History  Problem Relation Age of Onset  . COPD Mother   . Lung cancer Father   . Kidney disease Neg Hx   . Prostate cancer Neg Hx     Social History:  reports that he has quit smoking. He has never used  smokeless tobacco. He reports that he does not drink alcohol or use illicit drugs.  ROS: UROLOGY Frequent Urination?: No Hard to postpone urination?: No Burning/pain with urination?: No Get up at night to urinate?: No Leakage of urine?: No Urine stream starts and stops?: No Trouble starting stream?: No Do you have to strain to urinate?: No Blood in urine?: No Urinary tract infection?: No Sexually transmitted disease?: No Injury to kidneys or bladder?: No Painful intercourse?: No Weak stream?: No Erection problems?: No Penile pain?: No  Gastrointestinal Nausea?: No Vomiting?: No Indigestion/heartburn?: No Diarrhea?: No Constipation?: Yes  Constitutional Fever: No Night sweats?: No Weight loss?: Yes Fatigue?: No  Skin Skin rash/lesions?: No Itching?: No  Eyes Blurred vision?: No Double vision?: No  Ears/Nose/Throat Sore throat?: No Sinus problems?: Yes  Hematologic/Lymphatic Swollen glands?: Yes Easy bruising?: No  Cardiovascular Leg swelling?: No Chest pain?: No  Respiratory Cough?: No Shortness of breath?: Yes  Endocrine Excessive thirst?: No  Musculoskeletal Back pain?: No Joint pain?: No  Neurological Headaches?: No Dizziness?: No  Psychologic Depression?: Yes Anxiety?: Yes  Physical Exam: BP 148/70 mmHg  Pulse 93  Ht 5' 7.5" (1.715 m)  Wt 146 lb 11.2 oz (66.543 kg)  BMI 22.62 kg/m2  Constitutional: Well nourished. Alert and oriented, No acute distress. HEENT: [place] AT, moist mucus membranes. Trachea midline, no masses. Cardiovascular: No clubbing, cyanosis, or edema. Respiratory: Normal respiratory effort, no increased work of breathing. GI: Abdomen is soft, non tender, non distended, no abdominal masses. Liver and spleen not palpable.  No hernias appreciated.  Stool sample for occult testing is not indicated.   GU: No CVA tenderness.  No bladder fullness or masses.   Skin: No rashes, bruises or suspicious lesions. Lymph: No  cervical or inguinal adenopathy. Neurologic: Grossly intact, no focal deficits, moving all 4 extremities. Psychiatric: Normal mood and affect.   Laboratory Data: Lab Results  Component Value Date   WBC 5.7 01/20/2015   HGB 10.6* 01/20/2015   HCT 31.7* 01/20/2015   MCV 93.6 01/20/2015   PLT 192 01/20/2015    Lab Results  Component Value Date   CREATININE 0.69 01/24/2015    Urinalysis    Component Value Date/Time   COLORURINE YELLOW* 01/24/2015 0228   COLORURINE Straw 04/09/2014 1458   APPEARANCEUR HAZY* 01/24/2015 0228   APPEARANCEUR Clear 04/09/2014 1458  LABSPEC 1.012 01/24/2015 0228   LABSPEC 1.006 04/09/2014 1458   PHURINE 5.0 01/24/2015 0228   PHURINE 6.0 04/09/2014 1458   GLUCOSEU Negative 02/11/2015 1018   GLUCOSEU Negative 04/09/2014 1458   HGBUR 2+* 01/24/2015 0228   HGBUR 1+ 04/09/2014 1458   BILIRUBINUR Negative 02/11/2015 1018   BILIRUBINUR NEGATIVE 01/24/2015 0228   BILIRUBINUR Negative 04/09/2014 1458   KETONESUR NEGATIVE 01/24/2015 0228   KETONESUR Negative 04/09/2014 1458   PROTEINUR NEGATIVE 01/24/2015 0228   PROTEINUR Negative 04/09/2014 1458   NITRITE Negative 02/11/2015 1018   NITRITE NEGATIVE 01/24/2015 0228   NITRITE Negative 04/09/2014 1458   LEUKOCYTESUR 1+* 02/11/2015 1018   LEUKOCYTESUR 3+* 01/24/2015 0228   LEUKOCYTESUR Negative 04/09/2014 1458     Assessment & Plan:    1. BPH with retention:   Patient is having difficulty navigating the prostatic urethra from his description.  He will d/c the tamsulosin and start finasteride 5 mg daily.  I have offered him coude catheters samples which he will try.  He will contact us if he prefers the coude over the straights.  He will follow up in one year or sooner if he has difficulties.    2. Stage IV lung cancer:   Patient will not undergo any hematuria work up due to his poor prognosis.    Return in about 1 year (around 02/16/2016) for office visit.  Zara Council, [facility] [address], [place], [place] [address] [phone]

## 2015-02-23 ENCOUNTER — Telehealth: Payer: Self-pay | Admitting: Urology

## 2015-02-23 NOTE — Telephone Encounter (Signed)
Patient requesting catheters from [facility].  He requests the "Coloplast, Speedicath 14 with the end turned up in the green pack".  Do we have some samples that we can give him until his order from [facility] is delivered?

## 2015-02-23 NOTE — Telephone Encounter (Signed)
Getting in touch with Joe with [employer].

## 2015-02-25 ENCOUNTER — Inpatient Hospital Stay: Payer: Medicare HMO | Attending: Oncology | Admitting: Oncology

## 2015-02-25 VITALS — BP 127/77 | HR 109 | Temp 97.7°F | Wt 148.8 lb

## 2015-02-25 DIAGNOSIS — Z5111 Encounter for antineoplastic chemotherapy: Secondary | ICD-10-CM | POA: Diagnosis not present

## 2015-02-25 DIAGNOSIS — C3412 Malignant neoplasm of upper lobe, left bronchus or lung: Secondary | ICD-10-CM | POA: Diagnosis not present

## 2015-02-25 DIAGNOSIS — J449 Chronic obstructive pulmonary disease, unspecified: Secondary | ICD-10-CM | POA: Insufficient documentation

## 2015-02-25 DIAGNOSIS — J45909 Unspecified asthma, uncomplicated: Secondary | ICD-10-CM | POA: Diagnosis not present

## 2015-02-25 DIAGNOSIS — Z87891 Personal history of nicotine dependence: Secondary | ICD-10-CM | POA: Diagnosis not present

## 2015-02-25 DIAGNOSIS — Z79899 Other long term (current) drug therapy: Secondary | ICD-10-CM | POA: Diagnosis not present

## 2015-02-25 DIAGNOSIS — C3492 Malignant neoplasm of unspecified part of left bronchus or lung: Secondary | ICD-10-CM

## 2015-02-26 ENCOUNTER — Ambulatory Visit
Admission: RE | Admit: 2015-02-26 | Discharge: 2015-02-26 | Disposition: A | Discharge status: Home / Self Care | Payer: Medicare HMO | Source: Ambulatory Visit | Attending: Oncology | Admitting: Oncology

## 2015-02-26 ENCOUNTER — Inpatient Hospital Stay (HOSPITAL_BASED_OUTPATIENT_CLINIC_OR_DEPARTMENT_OTHER): Payer: Medicare HMO | Admitting: Oncology

## 2015-02-26 VITALS — BP 156/92 | HR 113 | Temp 96.8°F | Wt 148.6 lb

## 2015-02-26 DIAGNOSIS — Z5111 Encounter for antineoplastic chemotherapy: Secondary | ICD-10-CM | POA: Diagnosis not present

## 2015-02-26 DIAGNOSIS — Z85118 Personal history of other malignant neoplasm of bronchus and lung: Secondary | ICD-10-CM | POA: Insufficient documentation

## 2015-02-26 DIAGNOSIS — C3412 Malignant neoplasm of upper lobe, left bronchus or lung: Secondary | ICD-10-CM | POA: Diagnosis not present

## 2015-02-26 DIAGNOSIS — C3492 Malignant neoplasm of unspecified part of left bronchus or lung: Secondary | ICD-10-CM

## 2015-02-26 DIAGNOSIS — Z79899 Other long term (current) drug therapy: Secondary | ICD-10-CM

## 2015-02-26 DIAGNOSIS — M7989 Other specified soft tissue disorders: Secondary | ICD-10-CM | POA: Diagnosis present

## 2015-02-26 NOTE — Progress Notes (Signed)
Patient here today as acute add on for swelling in his chest where his mass was.

## 2015-02-28 ENCOUNTER — Encounter: Payer: Self-pay | Admitting: Oncology

## 2015-02-28 NOTE — Progress Notes (Addendum)
[place] @ [facility] [phone]  Fax:([phone]) [place]: [birth year]  MR#: [id number]  QPR#:[id number]  Patient Care Team: Albina Billet, MD as PCP - General (Internal Medicine)  CHIEF COMPLAINT:  Chief Complaint  Patient presents with  . Acute Visit    Oncology History   1.  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 Further staging workup with PET scan pending (diagnosis based on  CT scan ) July, 2016 2. SB RT for a T1 lesion of the left lung superior segment left upper lobe Biopsy was consistent with non-small cell carcinoma of lung in February of 2015 3.  Started radiation and chemotherapy for left upper lobe recurrent disease versus second primary from November 10, 2014   4.  Patient has finished total 6 weekly cycle of carboplatinum and Taxol on December 15, 2014 Will   Be finished   With radiation therapy by December 21, 2014      Oncology Flowsheet 11/17/2014 11/24/2014 12/01/2014 12/08/2014 12/15/2014 12/17/2014 01/12/2015  Day, Cycle Day 1, Cycle 2 Day 1, Cycle 3 Day 1, Cycle 4 Day 1, 5 Day 1, 5 - -  CARBOplatin (PARAPLATIN) IV 160 mg 160 mg 160 mg 160 mg 160 mg - -  dexamethasone (DECADRON) IV [ 20 mg ] [ 20 mg ] 20 mg 20 mg - 10 mg -  enoxaparin (LOVENOX) [place] - - - - - - -  LORazepam (ATIVAN) IV - - - - - - 0.5 mg  LORazepam (ATIVAN) PO - - - - - - -  ondansetron (ZOFRAN) IV [ 16 mg ] [ 16 mg ] - - [ 8 mg ] - -  PACLitaxel (TAXOL) IV 45 mg/m2 45 mg/m2 45 mg/m2 45 mg/m2 45 mg/m2 - -  prochlorperazine (COMPAZINE) PO - - 10 mg 10 mg - - -    INTERVAL HISTORY: 81 year old gentleman was started on chemotherapy with platinum and Taxol starting from July of 2016.  Patient is here for ongoing evaluation and treatment consideration.  Appetite is improved.  Chest pain is improved.  No nausea.  No vomiting.  No diarrhea.  Had a repeat PET scan done which has been independently reviewed and reviewed with the patient. Patient came today started  using oxygen complains of swelling on the left chest wall area.  Extremely apprehensive. Came with the family. According to him is noticed swelling on the left side where recurrent disease was diagnosed    PERFORMANCE STATUS (ECOG):01 Gen. status: Patient is extremely anxious. started using oxygen HEENT:  No visual changes, runny nose, sore throat, mouth sores or tenderness. Lungs: Dry hacking cough.  Complains of swelling of the left upper chest wall area Cardiac:  No chest pain, palpitations, orthopnea, or PND. GI:  No nausea, vomiting, diarrhea, constipation, melena or hematochezia. GU:  No urgency, frequency, dysuria, or hematuria. Musculoskeletal:  Left shoulder pain Extremities:  No pain or swelling. Skin:  No rashes or skin changes. Neuro:  No headache, numbness or weakness, balance or coordination issues. Endocrine:  No diabetes, thyroid issues, hot flashes or night sweats. Psych:  No mood changes, depression or anxiety. Pain:  No focal pain. Review of systems:  All other systems reviewed and found to be negative. As per HPI. Otherwise, a complete review of systems is negatve.  PAST MEDICAL HISTORY: Past Medical History  Diagnosis Date  . Major depressive disorder, recurrent, severe with psychotic features ([place])   . Persistent disorder  of initiating or maintaining sleep   . Generalized anxiety disorder   . Major depressive disorder, recurrent, severe with psychotic features ([place])   . Persistent disorder of initiating or maintaining sleep   . Asthma   . Cancer ([place])     Left lung  . COPD (chronic obstructive pulmonary disease) ([place])     PAST SURGICAL HISTORY: Past Surgical History  Procedure Laterality Date  . Hemorroidectomy    . Lung surgery      FAMILY HISTORY Family History  Problem Relation Age of Onset  . COPD Mother   . Lung cancer Father   . Kidney disease Neg Hx   . Prostate cancer Neg Hx     ADVANCED DIRECTIVES:  Patient does have advance  healthcare directive, Patient   does not desire to make any changes HEALTH MAINTENANCE: Social History  Substance Use Topics  . Smoking status: Former Research scientist (life sciences)  . Smokeless tobacco: Never Used     Comment: quit 18 years ago  . Alcohol Use: No      Allergies  Allergen Reactions  . Penicillins     Current Outpatient Prescriptions  Medication Sig Dispense Refill  . albuterol (PROVENTIL HFA;VENTOLIN HFA) 108 (90 BASE) MCG/ACT inhaler Inhale 1-2 puffs into the lungs QID.     Marland Kitchen atorvastatin (LIPITOR) 40 MG tablet Take 1 tablet by mouth daily.    Marland Kitchen econazole nitrate 1 % cream Apply 1 application topically 2 (two) times daily.     Marland Kitchen esomeprazole (NEXIUM) 40 MG capsule Take 40 mg by mouth daily at 12 noon.    . finasteride (PROSCAR) 5 MG tablet Take 1 tablet (5 mg total) by mouth daily. 30 tablet 12  . HYDROcodone-acetaminophen (NORCO/VICODIN) 5-325 MG per tablet Take 1 tablet by mouth every 4 (four) hours as needed for moderate pain. 30 tablet 0  . LORazepam (ATIVAN) 1 MG tablet Take 1 tablet 3 times a day and one half a tablet once a day. 315 tablet 0  . mometasone-formoterol (DULERA) 200-5 MCG/ACT AERO Inhale 2 puffs into the lungs 2 (two) times daily.     . ondansetron (ZOFRAN) 8 MG tablet Take 1 tablet (8 mg total) by mouth 2 (two) times daily. Start the day after chemo for 3 days. Then take as needed for nausea or vomiting. 30 tablet 1  . Polyethylene Glycol 3350 GRAN     . prochlorperazine (COMPAZINE) 10 MG tablet Take 1 tablet (10 mg total) by mouth every 6 (six) hours as needed (Nausea or vomiting). 30 tablet 1  . QUEtiapine (SEROQUEL XR) 300 MG 24 hr tablet Take 1 tablet (300 mg total) by mouth at bedtime. 90 tablet 0  . tamsulosin (FLOMAX) 0.4 MG CAPS capsule Take 1 capsule by mouth daily.    Marland Kitchen tiotropium (SPIRIVA) 18 MCG inhalation capsule Place into inhaler and inhale daily.    Marland Kitchen venlafaxine XR (EFFEXOR-XR) 150 MG 24 hr capsule Take 1 capsule (150 mg total) by mouth daily. 90  capsule 0  . zolpidem (AMBIEN) 10 MG tablet Take 1 tablet (10 mg total) by mouth at bedtime. 30 tablet 2   No current facility-administered medications for this visit.    OBJECTIVE:  Filed Vitals:   02/26/15 1425  BP: 156/92  Pulse: 113  Temp: 96.8 F (36 C)     Body mass index is 22.92 kg/(m^2).    ECOG FS:1 - Symptomatic but completely ambulatory  PHYSICAL EXAM: GENERAL:  Well developed, well nourished, sitting comfortably in the exam room  in no acute distress. MENTAL STATUS:  Alert and oriented to person, place and time. HEAD: Alopecia  Normocephalic, atraumatic, face symmetric, no Cushingoid features. EYES:  Pupils equal round and reactive to light and accomodation.  No conjunctivitis or scleral icterus. ENT:  Oropharynx clear without lesion.  Tongue normal. Mucous membranes moist.  RESPIRATORY:  Clear to auscultation without rales, wheezes or rhonchi. Chest wall i:n the left upper chest wall area, there might be some swelling related to radiation therapy CARDIOVASCULAR:  Regular rate and rhythm without murmur, rub or gallop. BREAST:  Right breast without masses, skin changes or nipple discharge.  Left breast without masses, skin changes or nipple discharge. ABDOMEN:  Soft, non-tender, with active bowel sounds, and no hepatosplenomegaly.  No masses. BACK:  No CVA tenderness.  No tenderness on percussion of the back or rib cage. SKIN:  No rashes, ulcers or lesions. EXTREMITIES: No edema, no skin discoloration or tenderness.  No palpable cords. LYMPH NODES: No palpable cervical, supraclavicular, axillary or inguinal adenopathy  NEUROLOGICAL: Unremarkable. PSYCH:  Appropriate.   LAB RESULTS:  No visits with results within 2 Day(s) from this visit. Latest known visit with results is:  Clinical Support on 02/11/2015  Component Date Value Ref Range Status  . Specific Gravity, UA 02/11/2015 1.010  1.005 - 1.030 Final  . pH, UA 02/11/2015 6.0  5.0 - 7.5 Final  . Color, UA  02/11/2015 Yellow  Yellow Final  . Appearance Ur 02/11/2015 Clear  Clear Final  . Leukocytes, UA 02/11/2015 1+* Negative Final  . Protein, UA 02/11/2015 Negative  Negative/Trace Final  . Glucose, UA 02/11/2015 Negative  Negative Final  . Ketones, UA 02/11/2015 Negative  Negative Final  . RBC, UA 02/11/2015 2+* Negative Final  . Bilirubin, UA 02/11/2015 Negative  Negative Final  . Urobilinogen, Ur 02/11/2015 0.2  0.2 - 1.0 mg/dL Final  . Nitrite, UA 02/11/2015 Negative  Negative Final  . Microscopic Examination 02/11/2015 See below:   Final  . Urine Culture, Comprehensive 02/11/2015 Final report   Final  . Result 1 02/11/2015 Comment   Final   No growth in 36 - 48 hours.  . WBC, UA 02/11/2015 0-5  0 -  5 /hpf Final  . RBC, UA 02/11/2015 3-10* 0 -  2 /hpf Final  . Epithelial Cells (non renal) 02/11/2015 None seen  0 - 10 /hpf Final  . Renal Epithel, UA 02/11/2015 None seen  None seen /hpf Final  . 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  Secondary infection, and   peripheral neuropathy .  Has been discussed in details. Informal consent has been obtained and will be documented by nurses in the chart Intent of chemotherapy is palliation and relief in symptoms and extending survival Specific side effect of diarrhea colitis and other hypersensitivity reaction has been discussed with the patient and family  Carcinoma, lung   Staging form: Lung, AJCC 7th Edition    Clinical: Stage IIIA (T4, N0, M0) - Signed by Forest Gleason, MD on 10/29/2014   Forest Gleason, MD   02/28/2015 8:15 AM

## 2015-03-01 ENCOUNTER — Encounter: Payer: Self-pay | Admitting: Oncology

## 2015-03-01 NOTE — Progress Notes (Signed)
[place] @ [facility] [phone]  Fax:([phone]) [place]: [birth year]  MR#: [id number]  XTK#:[id number]  Patient Care Team: Albina Billet, MD as PCP - General (Internal Medicine)  CHIEF COMPLAINT:  Chief Complaint  Patient presents with  . OTHER    Oncology History   1.  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 Further staging workup with PET scan pending (diagnosis based on  CT scan ) July, 2016 2. SB RT for a T1 lesion of the left lung superior segment left upper lobe Biopsy was consistent with non-small cell carcinoma of lung in February of 2015 3.  Started radiation and chemotherapy for left upper lobe recurrent disease versus second primary from November 10, 2014   4.  Patient has finished total 6 weekly cycle of carboplatinum and Taxol on December 15, 2014 Will   Be finished   With radiation therapy by December 21, 2014      Oncology Flowsheet 11/17/2014 11/24/2014 12/01/2014 12/08/2014 12/15/2014 12/17/2014 01/12/2015  Day, Cycle Day 1, Cycle 2 Day 1, Cycle 3 Day 1, Cycle 4 Day 1, 5 Day 1, 5 - -  CARBOplatin (PARAPLATIN) IV 160 mg 160 mg 160 mg 160 mg 160 mg - -  dexamethasone (DECADRON) IV [ 20 mg ] [ 20 mg ] 20 mg 20 mg - 10 mg -  enoxaparin (LOVENOX) [place] - - - - - - -  LORazepam (ATIVAN) IV - - - - - - 0.5 mg  LORazepam (ATIVAN) PO - - - - - - -  ondansetron (ZOFRAN) IV [ 16 mg ] [ 16 mg ] - - [ 8 mg ] - -  PACLitaxel (TAXOL) IV 45 mg/m2 45 mg/m2 45 mg/m2 45 mg/m2 45 mg/m2 - -  prochlorperazine (COMPAZINE) PO - - 10 mg 10 mg - - -    INTERVAL HISTORY: 81 year old gentleman was started on chemotherapy with platinum and Taxol starting from July of 2016.  Patient is here for ongoing evaluation and treatment consideration.  Appetite is improved.  Chest pain is improved.  No nausea.  No vomiting.  No diarrhea.  Had a repeat PET scan done which has been independently reviewed and reviewed with the patient.   PERFORMANCE STATUS  (ECOG):01 HEENT:  No visual changes, runny nose, sore throat, mouth sores or tenderness. Lungs: Dry hacking cough.  No hemoptysis. Cardiac:  No chest pain, palpitations, orthopnea, or PND. GI:  No nausea, vomiting, diarrhea, constipation, melena or hematochezia. GU:  No urgency, frequency, dysuria, or hematuria. Musculoskeletal:  Left shoulder pain Extremities:  No pain or swelling. Skin:  No rashes or skin changes. Neuro:  No headache, numbness or weakness, balance or coordination issues. Endocrine:  No diabetes, thyroid issues, hot flashes or night sweats. Psych:  No mood changes, depression or anxiety. Pain:  No focal pain. Review of systems:  All other systems reviewed and found to be negative. As per HPI. Otherwise, a complete review of systems is negatve.  PAST MEDICAL HISTORY: Past Medical History  Diagnosis Date  . Major depressive disorder, recurrent, severe with psychotic features ([place])   . Persistent disorder of initiating or maintaining sleep   . Generalized anxiety disorder   . Major depressive disorder, recurrent, severe with psychotic features ([place])   . Persistent disorder of initiating or maintaining sleep   . Asthma   . Cancer ([place])     Left lung  . COPD (chronic obstructive pulmonary disease) ([place])  PAST SURGICAL HISTORY: Past Surgical History  Procedure Laterality Date  . Hemorroidectomy    . Lung surgery      FAMILY HISTORY Family History  Problem Relation Age of Onset  . COPD Mother   . Lung cancer Father   . Kidney disease Neg Hx   . Prostate cancer Neg Hx     ADVANCED DIRECTIVES:  Patient does have advance healthcare directive, Patient   does not desire to make any changes HEALTH MAINTENANCE: Social History  Substance Use Topics  . Smoking status: Former Research scientist (life sciences)  . Smokeless tobacco: Never Used     Comment: quit 18 years ago  . Alcohol Use: No      Allergies  Allergen Reactions  . Penicillins     Current Outpatient Prescriptions   Medication Sig Dispense Refill  . albuterol (PROVENTIL HFA;VENTOLIN HFA) 108 (90 BASE) MCG/ACT inhaler Inhale 1-2 puffs into the lungs QID.     Marland Kitchen atorvastatin (LIPITOR) 40 MG tablet Take 1 tablet by mouth daily.    Marland Kitchen econazole nitrate 1 % cream Apply 1 application topically 2 (two) times daily.     Marland Kitchen esomeprazole (NEXIUM) 40 MG capsule Take 40 mg by mouth daily at 12 noon.    . finasteride (PROSCAR) 5 MG tablet Take 1 tablet (5 mg total) by mouth daily. 30 tablet 12  . HYDROcodone-acetaminophen (NORCO/VICODIN) 5-325 MG per tablet Take 1 tablet by mouth every 4 (four) hours as needed for moderate pain. 30 tablet 0  . LORazepam (ATIVAN) 1 MG tablet Take 1 tablet 3 times a day and one half a tablet once a day. 315 tablet 0  . mometasone-formoterol (DULERA) 200-5 MCG/ACT AERO Inhale 2 puffs into the lungs 2 (two) times daily.     . Polyethylene Glycol 3350 GRAN     . QUEtiapine (SEROQUEL XR) 300 MG 24 hr tablet Take 1 tablet (300 mg total) by mouth at bedtime. 90 tablet 0  . tamsulosin (FLOMAX) 0.4 MG CAPS capsule Take 1 capsule by mouth daily.    Marland Kitchen tiotropium (SPIRIVA) 18 MCG inhalation capsule Place into inhaler and inhale daily.    Marland Kitchen venlafaxine XR (EFFEXOR-XR) 150 MG 24 hr capsule Take 1 capsule (150 mg total) by mouth daily. 90 capsule 0  . zolpidem (AMBIEN) 10 MG tablet Take 1 tablet (10 mg total) by mouth at bedtime. 30 tablet 2   No current facility-administered medications for this visit.    OBJECTIVE:  Filed Vitals:   02/25/15 1503  BP: 127/77  Pulse: 109  Temp: 97.7 F (36.5 C)     Body mass index is 22.95 kg/(m^2).    ECOG FS:1 - Symptomatic but completely ambulatory  PHYSICAL EXAM: GENERAL:  Well developed, well nourished, sitting comfortably in the exam room in no acute distress. MENTAL STATUS:  Alert and oriented to person, place and time. HEAD: Alopecia  Normocephalic, atraumatic, face symmetric, no Cushingoid features. EYES:  Pupils equal round and reactive to light  and accomodation.  No conjunctivitis or scleral icterus. ENT:  Oropharynx clear without lesion.  Tongue normal. Mucous membranes moist.  RESPIRATORY:  Clear to auscultation without rales, wheezes or rhonchi. CARDIOVASCULAR:  Regular rate and rhythm without murmur, rub or gallop. BREAST:  Right breast without masses, skin changes or nipple discharge.  Left breast without masses, skin changes or nipple discharge. ABDOMEN:  Soft, non-tender, with active bowel sounds, and no hepatosplenomegaly.  No masses. BACK:  No CVA tenderness.  No tenderness on percussion of the back or  rib cage. SKIN:  No rashes, ulcers or lesions. EXTREMITIES: No edema, no skin discoloration or tenderness.  No palpable cords. LYMPH NODES: No palpable cervical, supraclavicular, axillary or inguinal adenopathy  NEUROLOGICAL: Unremarkable. PSYCH:  Appropriate.   LAB RESULTS:  No visits with results within 2 Day(s) from this visit. Latest known visit with results is:  Clinical Support on 02/11/2015  Component Date Value Ref Range Status  . Specific Gravity, UA 02/11/2015 1.010  1.005 - 1.030 Final  . pH, UA 02/11/2015 6.0  5.0 - 7.5 Final  . Color, UA 02/11/2015 Yellow  Yellow Final  . Appearance Ur 02/11/2015 Clear  Clear Final  . Leukocytes, UA 02/11/2015 1+* Negative Final  . Protein, UA 02/11/2015 Negative  Negative/Trace Final  . Glucose, UA 02/11/2015 Negative  Negative Final  . Ketones, UA 02/11/2015 Negative  Negative Final  . RBC, UA 02/11/2015 2+* Negative Final  . Bilirubin, UA 02/11/2015 Negative  Negative Final  . Urobilinogen, Ur 02/11/2015 0.2  0.2 - 1.0 mg/dL Final  . Nitrite, UA 02/11/2015 Negative  Negative Final  . Microscopic Examination 02/11/2015 See below:   Final  . Urine Culture, Comprehensive 02/11/2015 Final report   Final  . Result 1 02/11/2015 Comment   Final   No growth in 36 - 48 hours.  . WBC, UA 02/11/2015 0-5  0 -  5 /hpf Final  . RBC, UA 02/11/2015 3-10* 0 -  2 /hpf Final  .  Epithelial Cells (non renal) 02/11/2015 None seen  0 - 10 /hpf Final  . Renal Epithel, UA 02/11/2015 None seen  None seen /hpf Final  . Bacteria, UA 02/11/2015 None seen  None seen/Few Final      STUDIES: Guidance study., TP3,BRAF,JAK3,BRCA1 mutated in the blood testing (August, 2016) ASSESSMENT: Recurrent or another primary with left upper lobe invading vertebral body.    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  Secondary infection, and   peripheral neuropathy .  Has been discussed in details. Informal consent has been obtained and will be documented by nurses in the chart     Carcinoma, lung   Staging form: Lung, AJCC 7th Edition    Clinical: Stage IIIA (T4, N0, M0) - Signed by Forest Gleason, MD on 10/29/2014   Forest Gleason, MD   03/01/2015 12:36 PM

## 2015-03-03 ENCOUNTER — Other Ambulatory Visit: Payer: Self-pay | Admitting: Oncology

## 2015-03-03 ENCOUNTER — Other Ambulatory Visit: Payer: Self-pay | Admitting: Family Medicine

## 2015-03-04 ENCOUNTER — Encounter: Payer: Self-pay | Admitting: Psychiatry

## 2015-03-04 ENCOUNTER — Ambulatory Visit (INDEPENDENT_AMBULATORY_CARE_PROVIDER_SITE_OTHER): Payer: Medicare HMO | Admitting: Psychiatry

## 2015-03-04 VITALS — BP 138/72 | HR 94 | Temp 97.7°F | Ht 67.5 in | Wt 149.2 lb

## 2015-03-04 DIAGNOSIS — F411 Generalized anxiety disorder: Secondary | ICD-10-CM | POA: Diagnosis not present

## 2015-03-04 DIAGNOSIS — F333 Major depressive disorder, recurrent, severe with psychotic symptoms: Secondary | ICD-10-CM

## 2015-03-04 NOTE — Progress Notes (Signed)
BH MD/PA/NP OP Progress Note  03/04/2015 3:18 PM Timothy Ali  MRN:  [id number]  Subjective:  Patient presents for follow-up of his generalized anxiety disorder, insomnia and major depressive disorder. He states overall he is doing pretty well. He states he's gotten clarification in terms of his chemotherapy treatment for his lung cancer. He states he's now got a plan set for once a month chemotherapy with his oncologist. He states he actually had made some arrangements to get a consultation at a another [facility] however in the process of that his current oncologist called him and has set up a plan. Patient states he's been a continuing with his current oncologist.  I discussed with him it would be important for him to have her routine to deal with the stress and anxiety in addition to medications. He discussed that he is artery gone to some senior centers and did not like them because all they do is play bingo. I encouraged him to return to them as he might find a different group people or some different activities. 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  He brought up on his own that he might need to take more of his lorazepam and I prescribed. I emphasized with him was important to state to the prescribed amounts. I also told him that given that he is going through cancer treatment I am not lowering the dose but him aware that I might would be decreasing the dose further if he were not involved in such a stressful medical issue.   Chief Complaint: Anxiety Chief Complaint    Follow-up; Medication Refill     Visit Diagnosis:     ICD-9-CM ICD-10-CM   1. GAD (generalized anxiety disorder) 300.02 F41.1   2. Severe recurrent major depressive disorder with psychotic features ([place]) 296.34 F33.3      Past Medical History:  Past Medical History  Diagnosis Date  . Major depressive disorder, recurrent, severe with psychotic features ([place])   . Persistent disorder of initiating or maintaining sleep   . Generalized anxiety disorder   . Major depressive disorder, recurrent, severe with psychotic features ([place])   . Persistent disorder of initiating or maintaining sleep   . Asthma   . Cancer ([place])     Left lung  . COPD (chronic obstructive pulmonary disease) [facility])     Past Surgical History  Procedure Laterality Date  . Hemorroidectomy    . Lung surgery     Family History:  Family History  Problem Relation Age of Onset  . COPD Mother   . Lung cancer Father   . Kidney disease Neg Hx   . Prostate cancer Neg Hx    Social History:  Social History   Social History  . Marital Status: Widowed    Spouse Name: N/A  . Number of Children: N/A  . Years of Education: N/A   Social History Main Topics  . Smoking status: Former Research scientist (life sciences)  . Smokeless tobacco: Never Used     Comment: quit 18 years ago  . Alcohol Use: No  . Drug Use: No  . Sexual Activity: No   Other Topics Concern  . None   Social History Narrative   Additional History:   Assessment:   Musculoskeletal: Strength & Muscle  Tone: within normal limits Gait & Station: normal Patient leans: N/A  Psychiatric Specialty Exam: Anxiety Symptoms include nervous/anxious behavior. Patient reports no insomnia (doing well with Ambien) or suicidal ideas.    Depression        Associated symptoms include does not have insomnia (doing well with Ambien) and no suicidal ideas.  Past medical history includes anxiety.     Review of Systems  Psychiatric/Behavioral: Negative for depression, suicidal ideas, hallucinations, memory loss and substance abuse. The patient is nervous/anxious. The patient does not have insomnia (doing well with Ambien).   All other systems reviewed and are negative.   Blood pressure 138/72, pulse 94,  temperature 97.7 F (36.5 C), temperature source Tympanic, height 5' 7.5" (1.715 m), weight 149 lb 3.2 oz (67.677 kg), SpO2 91 %.Body mass index is 23.01 kg/(m^2).  General Appearance: Neat and Well Groomed  Eye Contact:  Good  Speech:  Clear and Coherent and Normal Rate  Volume:  Normal  Mood:  Worried   Affect:  Anxious  Thought Process:  Linear and Logical  Orientation:  Full (Time, Place, and Person)  Thought Content:  Not verbalizing paranoid ideation as in past visit  Suicidal Thoughts:  No  Homicidal Thoughts:  No  Memory:  Immediate;   Good Recent;   Good Remote;   Good  Judgement:  Good  Insight:  Fair  Psychomotor Activity:  Negative  Concentration:  Good  Recall:  Good  Fund of Knowledge: Good  Language: Good  Akathisia:  Negative  Handed:  Right unknown  AIMS (if indicated):  Not done  Assets:  Desire for Improvement  ADL's:  Intact  Cognition: WNL  Sleep:  5 hour per night   Is the patient at risk to self?  No. Has the patient been a risk to self in the past 6 months?  No. Has the patient been a risk to self within the distant past?  No. Is the patient a risk to others?  No. Has the patient been a risk to others in the past 6 months?  No. Has the patient been a risk to others within the distant past?  No.  Current Medications: Current Outpatient Prescriptions  Medication Sig Dispense Refill  . albuterol (PROVENTIL HFA;VENTOLIN HFA) 108 (90 BASE) MCG/ACT inhaler Inhale 1-2 puffs into the lungs QID.     Marland Kitchen atorvastatin (LIPITOR) 40 MG tablet Take 1 tablet by mouth daily.    Marland Kitchen econazole nitrate 1 % cream Apply 1 application topically 2 (two) times daily.     Marland Kitchen esomeprazole (NEXIUM) 40 MG capsule Take 40 mg by mouth daily at 12 noon.    . finasteride (PROSCAR) 5 MG tablet Take 1 tablet (5 mg total) by mouth daily. 30 tablet 12  . HYDROcodone-acetaminophen (NORCO/VICODIN) 5-325 MG per tablet Take 1 tablet by mouth every 4 (four) hours as needed for moderate pain.  30 tablet 0  . LORazepam (ATIVAN) 1 MG tablet Take 1 tablet 3 times a day and one half a tablet once a day. 315 tablet 0  . mometasone-formoterol (DULERA) 200-5 MCG/ACT AERO Inhale 2 puffs into the lungs 2 (two) times daily.     . Polyethylene Glycol 3350 GRAN     . QUEtiapine (SEROQUEL XR) 300 MG 24 hr tablet Take 1 tablet (300 mg total) by mouth at bedtime. 90 tablet 0  . tamsulosin (FLOMAX) 0.4 MG CAPS capsule Take 1 capsule by mouth daily.    Marland Kitchen tiotropium (SPIRIVA) 18 MCG inhalation capsule  Place into inhaler and inhale daily.    Marland Kitchen venlafaxine XR (EFFEXOR-XR) 150 MG 24 hr capsule Take 1 capsule (150 mg total) by mouth daily. 90 capsule 0  . zolpidem (AMBIEN) 10 MG tablet Take 1 tablet (10 mg total) by mouth at bedtime. 30 tablet 2   No current facility-administered medications for this visit.    Medical Decision Making:  Established Problem, Stable/Improving (1), Review of Medication Regimen & Side Effects (2) and Review of New Medication or Change in Dosage (2)  Treatment Plan Summary:Medication management and Plan    Continue lorazepam 1 mg 3 times a day and a half a milligram once a day. Patient is high resistant to further decrease his and given his most recent stressor we will not decrease his lorazepam at this time. We'll continue him on his Effexor XR at 150 mg daily, we'll continue the Seroquel XR 300 mg at bedtime. Continue his Ambien 10 mg at bedtime as needed.    Major depressive disorder, recurrent severe with psychotic features-stable on Effexor and Seroquel.  Generalized anxiety disorder-we'll continue use the Effexor and keep the lorazepam 1 mg 3 times a day and a half a milligram once a day.   He will follow-up in 1 month.   Faith Rogue 03/04/2015, 3:18 PM

## 2015-03-05 ENCOUNTER — Inpatient Hospital Stay (HOSPITAL_BASED_OUTPATIENT_CLINIC_OR_DEPARTMENT_OTHER): Payer: Medicare HMO | Admitting: Oncology

## 2015-03-05 ENCOUNTER — Encounter: Payer: Self-pay | Admitting: Oncology

## 2015-03-05 ENCOUNTER — Inpatient Hospital Stay: Payer: Medicare HMO

## 2015-03-05 VITALS — BP 105/62 | HR 101 | Temp 95.8°F | Wt 147.7 lb

## 2015-03-05 DIAGNOSIS — Z79899 Other long term (current) drug therapy: Secondary | ICD-10-CM | POA: Diagnosis not present

## 2015-03-05 DIAGNOSIS — C3492 Malignant neoplasm of unspecified part of left bronchus or lung: Secondary | ICD-10-CM

## 2015-03-05 DIAGNOSIS — Z5111 Encounter for antineoplastic chemotherapy: Secondary | ICD-10-CM | POA: Diagnosis not present

## 2015-03-05 DIAGNOSIS — C3412 Malignant neoplasm of upper lobe, left bronchus or lung: Secondary | ICD-10-CM | POA: Diagnosis not present

## 2015-03-05 DIAGNOSIS — C3490 Malignant neoplasm of unspecified part of unspecified bronchus or lung: Secondary | ICD-10-CM

## 2015-03-05 LAB — COMPREHENSIVE METABOLIC PANEL
ALK PHOS: 65 U/L (ref 38–126)
ALT: 14 U/L — AB (ref 17–63)
AST: 17 U/L (ref 15–41)
Albumin: 3.4 g/dL — ABNORMAL LOW (ref 3.5–5.0)
Anion gap: 5 (ref 5–15)
BUN: 20 mg/dL (ref 6–20)
CALCIUM: 8.6 mg/dL — AB (ref 8.9–10.3)
CO2: 29 mmol/L (ref 22–32)
CREATININE: 0.81 mg/dL (ref 0.61–1.24)
Chloride: 103 mmol/L (ref 101–111)
GFR calc non Af Amer: >60 mL/min (ref >60)
GLUCOSE: 144 mg/dL — AB (ref 65–99)
Potassium: 4.9 mmol/L (ref 3.5–5.1)
SODIUM: 137 mmol/L (ref 135–145)
Total Bilirubin: 0.5 mg/dL (ref 0.3–1.2)
Total Protein: 6.9 g/dL (ref 6.5–8.1)

## 2015-03-05 LAB — CBC WITH DIFFERENTIAL/PLATELET
Basophils Absolute: 0 10*3/uL (ref 0–0.1)
Basophils Relative: 1 %
EOS ABS: 0.2 10*3/uL (ref 0–0.7)
Eosinophils Relative: 3 %
HCT: 36.2 % — ABNORMAL LOW (ref 40.0–52.0)
HEMOGLOBIN: 11.9 g/dL — AB (ref 13.0–18.0)
LYMPHS ABS: 1.1 10*3/uL (ref 1.0–3.6)
LYMPHS PCT: 18 %
MCH: 31.1 pg (ref 26.0–34.0)
MCHC: 33 g/dL (ref 32.0–36.0)
MCV: 94.1 fL (ref 80.0–100.0)
Monocytes Absolute: 0.6 10*3/uL (ref 0.2–1.0)
Monocytes Relative: 11 %
NEUTROS PCT: 67 %
Neutro Abs: 4.1 10*3/uL (ref 1.4–6.5)
Platelets: 158 10*3/uL (ref 150–440)
RBC: 3.84 MIL/uL — AB (ref 4.40–5.90)
RDW: 15.7 % — ABNORMAL HIGH (ref 11.5–14.5)
WBC: 6 10*3/uL (ref 3.8–10.6)

## 2015-03-05 MED ORDER — SODIUM CHLORIDE 0.9 % IV SOLN
1200.0000 mg | Freq: Once | INTRAVENOUS | Status: AC
  Administered 2015-03-05: 1200 mg via INTRAVENOUS
  Filled 2015-03-05: qty 20

## 2015-03-05 MED ORDER — SODIUM CHLORIDE 0.9 % IV SOLN
Freq: Once | INTRAVENOUS | Status: AC
  Administered 2015-03-05: 09:00:00 via INTRAVENOUS
  Filled 2015-03-05: qty 1000

## 2015-03-05 NOTE — Progress Notes (Signed)
[place] @ [facility] [phone]) [phone]  Fax:(336) [place]: 12-17-1929  MR#: [id number]  [facility]#:[id number]  Patient Care Team: Albina Billet, MD as PCP - General (Internal Medicine)  CHIEF COMPLAINT:  Chief Complaint  Patient presents with  . OTHER    Oncology History   1.  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 Further staging workup with PET scan pending (diagnosis based on  CT scan ) July, 2016 2. SB RT for a T1 lesion of the left lung superior segment left upper lobe Biopsy was consistent with non-small cell carcinoma of lung in February of 2015 3.  Started radiation and chemotherapy for left upper lobe recurrent disease versus second primary from November 10, 2014   4.  Patient has finished total 6 weekly cycle of carboplatinum and Taxol on December 15, 2014 Will   Be finished   With radiation therapy by December 21, 2014 5.  Persistent disease and persistent pain.  Patient has been started on TECENTRIQ from March 05, 2015     Oncology Flowsheet 11/17/2014 11/24/2014 12/01/2014 12/08/2014 12/15/2014 12/17/2014 01/12/2015  Day, Cycle Day 1, Cycle 2 Day 1, Cycle 3 Day 1, Cycle 4 Day 1, 5 Day 1, 5 - -  CARBOplatin (PARAPLATIN) IV 160 mg 160 mg 160 mg 160 mg 160 mg - -  dexamethasone (DECADRON) IV [ 20 mg ] [ 20 mg ] 20 mg 20 mg - 10 mg -  enoxaparin (LOVENOX) [place] - - - - - - -  LORazepam (ATIVAN) IV - - - - - - 0.5 mg  LORazepam (ATIVAN) PO - - - - - - -  ondansetron (ZOFRAN) IV [ 16 mg ] [ 16 mg ] - - [ 8 mg ] - -  PACLitaxel (TAXOL) IV 45 mg/m2 45 mg/m2 45 mg/m2 45 mg/m2 45 mg/m2 - -  prochlorperazine (COMPAZINE) PO - - 10 mg 10 mg - - -    INTERVAL HISTORY: 81 year old gentleman was started on chemotherapy with platinum and Taxol starting from July of 2016.  Patient is here for ongoing evaluation and treatment consideration.  Appetite is improved.  Chest pain is improved.  No nausea.  No vomiting.  No diarrhea.  Had a repeat PET scan  done which has been independently reviewed and reviewed with the patient.. Patient is here for ongoing evaluation and treatment consideration.  Will proceed with chemotherapy patient and number of questions which will be answered.  No nausea no vomiting.  Discomfort in the left upper chest wall area persist   PERFORMANCE STATUS (ECOG):01 HEENT:  No visual changes, runny nose, sore throat, mouth sores or tenderness. Lungs: Dry hacking cough.  No hemoptysis. Cardiac:  No chest pain, palpitations, orthopnea, or PND. GI:  No nausea, vomiting, diarrhea, constipation, melena or hematochezia. GU:  No urgency, frequency, dysuria, or hematuria. Musculoskeletal:  Left shoulder pain Extremities:  No pain or swelling. Skin:  No rashes or skin changes. Neuro:  No headache, numbness or weakness, balance or coordination issues. Endocrine:  No diabetes, thyroid issues, hot flashes or night sweats. Psych:  No mood changes, depression or anxiety. Pain:  No focal pain. Review of systems:  All other systems reviewed and found to be negative. As per HPI. Otherwise, a complete review of systems is negatve.  PAST MEDICAL HISTORY: Past Medical History  Diagnosis Date  . Major depressive disorder, recurrent, severe with psychotic features ([place])   . Persistent disorder of  initiating or maintaining sleep   . Generalized anxiety disorder   . Major depressive disorder, recurrent, severe with psychotic features ([place])   . Persistent disorder of initiating or maintaining sleep   . Asthma   . Cancer ([place])     Left lung  . COPD (chronic obstructive pulmonary disease) ([place])     PAST SURGICAL HISTORY: Past Surgical History  Procedure Laterality Date  . Hemorroidectomy    . Lung surgery      FAMILY HISTORY Family History  Problem Relation Age of Onset  . COPD Mother   . Lung cancer Father   . Kidney disease Neg Hx   . Prostate cancer Neg Hx     ADVANCED DIRECTIVES:  Patient does have advance healthcare  directive, Patient   does not desire to make any changes HEALTH MAINTENANCE: Social History  Substance Use Topics  . Smoking status: Former Research scientist (life sciences)  . Smokeless tobacco: Never Used     Comment: quit 18 years ago  . Alcohol Use: No      Allergies  Allergen Reactions  . Penicillins     Current Outpatient Prescriptions  Medication Sig Dispense Refill  . albuterol (PROVENTIL HFA;VENTOLIN HFA) 108 (90 BASE) MCG/ACT inhaler Inhale 1-2 puffs into the lungs QID.     Marland Kitchen atorvastatin (LIPITOR) 40 MG tablet Take 1 tablet by mouth daily.    Marland Kitchen econazole nitrate 1 % cream Apply 1 application topically 2 (two) times daily.     Marland Kitchen esomeprazole (NEXIUM) 40 MG capsule Take 40 mg by mouth daily at 12 noon.    . finasteride (PROSCAR) 5 MG tablet Take 1 tablet (5 mg total) by mouth daily. 30 tablet 12  . HYDROcodone-acetaminophen (NORCO/VICODIN) 5-325 MG per tablet Take 1 tablet by mouth every 4 (four) hours as needed for moderate pain. 30 tablet 0  . LORazepam (ATIVAN) 1 MG tablet Take 1 tablet 3 times a day and one half a tablet once a day. 315 tablet 0  . mometasone-formoterol (DULERA) 200-5 MCG/ACT AERO Inhale 2 puffs into the lungs 2 (two) times daily.     . Polyethylene Glycol 3350 GRAN     . QUEtiapine (SEROQUEL XR) 300 MG 24 hr tablet Take 1 tablet (300 mg total) by mouth at bedtime. 90 tablet 0  . tamsulosin (FLOMAX) 0.4 MG CAPS capsule Take 1 capsule by mouth daily.    Marland Kitchen tiotropium (SPIRIVA) 18 MCG inhalation capsule Place into inhaler and inhale daily.    Marland Kitchen venlafaxine XR (EFFEXOR-XR) 150 MG 24 hr capsule Take 1 capsule (150 mg total) by mouth daily. 90 capsule 0  . zolpidem (AMBIEN) 10 MG tablet Take 1 tablet (10 mg total) by mouth at bedtime. 30 tablet 2   No current facility-administered medications for this visit.    OBJECTIVE:  Filed Vitals:   03/05/15 0829  BP: 105/62  Pulse: 101  Temp: 95.8 F (35.4 C)     Body mass index is 22.78 kg/(m^2).    ECOG FS:1 - Symptomatic but  completely ambulatory  PHYSICAL EXAM: GENERAL:  Well developed, well nourished, sitting comfortably in the exam room in no acute distress. MENTAL STATUS:  Alert and oriented to person, place and time. HEAD: Alopecia  Normocephalic, atraumatic, face symmetric, no Cushingoid features. EYES:  Pupils equal round and reactive to light and accomodation.  No conjunctivitis or scleral icterus. ENT:  Oropharynx clear without lesion.  Tongue normal. Mucous membranes moist.  RESPIRATORY:  Clear to auscultation without rales, wheezes or rhonchi. CARDIOVASCULAR:  Regular rate and rhythm without murmur, rub or gallop. BREAST:  Right breast without masses, skin changes or nipple discharge.  Left breast without masses, skin changes or nipple discharge. ABDOMEN:  Soft, non-tender, with active bowel sounds, and no hepatosplenomegaly.  No masses. BACK:  No CVA tenderness.  No tenderness on percussion of the back or rib cage. SKIN:  No rashes, ulcers or lesions. EXTREMITIES: No edema, no skin discoloration or tenderness.  No palpable cords. LYMPH NODES: No palpable cervical, supraclavicular, axillary or inguinal adenopathy  NEUROLOGICAL: Unremarkable. PSYCH:  Appropriate.   LAB RESULTS:  Appointment on 03/05/2015  Component Date Value Ref Range Status  . WBC 03/05/2015 6.0  3.8 - 10.6 K/uL Final  . RBC 03/05/2015 3.84* 4.40 - 5.90 MIL/uL Final  . Hemoglobin 03/05/2015 11.9* 13.0 - 18.0 g/dL Final  . HCT 03/05/2015 36.2* 40.0 - 52.0 % Final  . MCV 03/05/2015 94.1  80.0 - 100.0 fL Final  . MCH 03/05/2015 31.1  26.0 - 34.0 pg Final  . MCHC 03/05/2015 33.0  32.0 - 36.0 g/dL Final  . RDW 03/05/2015 15.7* 11.5 - 14.5 % Final  . Platelets 03/05/2015 158  150 - 440 K/uL Final  . Neutrophils Relative % 03/05/2015 67   Final  . Neutro Abs 03/05/2015 4.1  1.4 - 6.5 K/uL Final  . Lymphocytes Relative 03/05/2015 18   Final  . Lymphs Abs 03/05/2015 1.1  1.0 - 3.6 K/uL Final  . Monocytes Relative 03/05/2015 11    Final  . Monocytes Absolute 03/05/2015 0.6  0.2 - 1.0 K/uL Final  . Eosinophils Relative 03/05/2015 3   Final  . Eosinophils Absolute 03/05/2015 0.2  0 - 0.7 K/uL Final  . Basophils Relative 03/05/2015 1   Final  . Basophils Absolute 03/05/2015 0.0  0 - 0.1 K/uL Final  . Sodium 03/05/2015 137  135 - 145 mmol/L Final  . Potassium 03/05/2015 4.9  3.5 - 5.1 mmol/L Final  . Chloride 03/05/2015 103  101 - 111 mmol/L Final  . CO2 03/05/2015 29  22 - 32 mmol/L Final  . Glucose, Bld 03/05/2015 144* 65 - 99 mg/dL Final  . BUN 03/05/2015 20  6 - 20 mg/dL Final  . Creatinine, Ser 03/05/2015 0.81  0.61 - 1.24 mg/dL Final  . Calcium 03/05/2015 8.6* 8.9 - 10.3 mg/dL Final  . Total Protein 03/05/2015 6.9  6.5 - 8.1 g/dL Final  . Albumin 03/05/2015 3.4* 3.5 - 5.0 g/dL Final  . AST 03/05/2015 17  15 - 41 U/L Final  . ALT 03/05/2015 14* 17 - 63 U/L Final  . Alkaline Phosphatase 03/05/2015 65  38 - 126 U/L Final  . Total Bilirubin 03/05/2015 0.5  0.3 - 1.2 mg/dL Final  . GFR calc non Af Amer 03/05/2015 >60  >60 mL/min Final  . GFR calc Af Amer 03/05/2015 >60  >60 mL/min Final   Comment: (NOTE) The eGFR has been calculated using the CKD EPI equation. This calculation has not been validated in all clinical situations. eGFR's persistently <60 mL/min signify possible Chronic Kidney Disease.   . Anion gap 03/05/2015 5  5 - 15 Final      STUDIES: Guidance study., TP3,BRAF,JAK3,BRCA1 mutated in the blood testing (August, 2016) ASSESSMENT: Recurrent or another primary with left upper lobe invading vertebral body.    patient is here with swelling of the left side Review of chest x-ray does not show any pleural effusion Swelling appears to be secondary to radiation therapy Will proceed with NIVOLULAMAB for  recurrent and persistent squamous cell carcinoma of lung Intent of chemotherapy is palliation and relief in symptoms and extending survival All the side effects of chemotherapy including  myelosuppression, alopecia, nausea vomiting fatigue weakness.  Secondary infection, and   peripheral neuropathy .  Has been discussed in details. InformED consent has been obtained and will be documented by nurses in the chart     Carcinoma, lung   Staging form: Lung, AJCC 7th Edition    Clinical: Stage IIIA (T4, N0, M0) - Signed by Forest Gleason, MD on 10/29/2014   Forest Gleason, MD   03/05/2015 8:49 AM

## 2015-03-16 ENCOUNTER — Emergency Department: Payer: Medicare HMO

## 2015-03-16 ENCOUNTER — Encounter: Payer: Self-pay | Admitting: Emergency Medicine

## 2015-03-16 ENCOUNTER — Emergency Department
Admission: EM | Admit: 2015-03-16 | Discharge: 2015-03-16 | Disposition: A | Discharge status: Home / Self Care | Payer: Medicare HMO | Attending: Emergency Medicine | Admitting: Emergency Medicine

## 2015-03-16 DIAGNOSIS — Z880 Allergy status to penicillin: Secondary | ICD-10-CM | POA: Insufficient documentation

## 2015-03-16 DIAGNOSIS — F419 Anxiety disorder, unspecified: Secondary | ICD-10-CM | POA: Insufficient documentation

## 2015-03-16 DIAGNOSIS — Z7951 Long term (current) use of inhaled steroids: Secondary | ICD-10-CM | POA: Insufficient documentation

## 2015-03-16 DIAGNOSIS — Z87891 Personal history of nicotine dependence: Secondary | ICD-10-CM | POA: Diagnosis not present

## 2015-03-16 DIAGNOSIS — K5900 Constipation, unspecified: Secondary | ICD-10-CM

## 2015-03-16 DIAGNOSIS — Z79899 Other long term (current) drug therapy: Secondary | ICD-10-CM | POA: Diagnosis not present

## 2015-03-16 MED ORDER — MAGNESIUM CITRATE PO SOLN: 1.0000 | Freq: Once | ORAL | Status: DC

## 2015-03-16 NOTE — ED Notes (Signed)
States he has not had a bowel movement in 4-5 days   Denies any n/v

## 2015-03-16 NOTE — ED Provider Notes (Signed)
[facility] Emergency Department Provider Note  ____________________________________________  Time seen: 1:40 PM  I have reviewed the triage vital signs and the nursing notes.   HISTORY  Chief Complaint Constipation    HPI Timothy Ali is a 81 y.o. male who presents with complaints of constipation. Patient reports she has not had a bowel movement for 5 days. He attributes this to chemotherapy. This is happened before several months ago and he came to the emergency department. He denies nausea vomiting. No abdominal pain. Slightly bloated. He has tried MiraLAX and Dulcolax at home. He also self administered an enema with little relief.Patient does report that he is very anxious about this     Past Medical History  Diagnosis Date  . Major depressive disorder, recurrent, severe with psychotic features ([place])   . Persistent disorder of initiating or maintaining sleep   . Generalized anxiety disorder   . Major depressive disorder, recurrent, severe with psychotic features ([place])   . Persistent disorder of initiating or maintaining sleep   . Asthma   . Cancer ([place])     Left lung  . COPD (chronic obstructive pulmonary disease) [facility])     Patient Active Problem List   Diagnosis Date Noted  . BPH (benign prostatic hypertrophy) with urinary retention 02/16/2015  . Metastatic lung cancer (metastasis from lung to other site) ([place]) 02/16/2015  . Lung cancer ([place]) 10/24/2014  . Acute on chronic respiratory failure ([place]) 10/23/2014  . Leucocytosis 10/23/2014  . COPD (chronic obstructive pulmonary disease) ([place]) 10/23/2014  . Carcinoma, lung ([place]) 10/23/2014  . CAFL (chronic airflow limitation) ([place]) 08/01/2014  . Depression, major, recurrent, severe with psychosis ([place]) 08/01/2014  . H/O gastrointestinal disease 08/01/2014  . Anxiety, generalized 08/01/2014  . H/O elevated lipids 08/01/2014  . Insomnia, persistent 08/01/2014  . H/O malignant neoplasm  08/01/2014  . Prostate disease 08/01/2014  . Macular degeneration 08/01/2014  . Obstruction of urinary tract 08/01/2014    Past Surgical History  Procedure Laterality Date  . Hemorroidectomy    . Lung surgery      Current Outpatient Rx  Name  Route  Sig  Dispense  Refill  . albuterol (PROVENTIL HFA;VENTOLIN HFA) 108 (90 BASE) MCG/ACT inhaler   Inhalation   Inhale 1-2 puffs into the lungs QID.          Marland Kitchen atorvastatin (LIPITOR) 40 MG tablet   Oral   Take 1 tablet by mouth daily.         Marland Kitchen econazole nitrate 1 % cream   Topical   Apply 1 application topically 2 (two) times daily.          Marland Kitchen esomeprazole (NEXIUM) 40 MG capsule   Oral   Take 40 mg by mouth daily at 12 noon.         . finasteride (PROSCAR) 5 MG tablet   Oral   Take 1 tablet (5 mg total) by mouth daily.   30 tablet   12   . HYDROcodone-acetaminophen (NORCO/VICODIN) 5-325 MG per tablet   Oral   Take 1 tablet by mouth every 4 (four) hours as needed for moderate pain.   30 tablet   0   . LORazepam (ATIVAN) 1 MG tablet      Take 1 tablet 3 times a day and one half a tablet once a day.   315 tablet   0   . mometasone-formoterol (DULERA) 200-5 MCG/ACT AERO   Inhalation   Inhale 2 puffs into the lungs  2 (two) times daily.          . Polyethylene Glycol 3350 GRAN               . QUEtiapine (SEROQUEL XR) 300 MG 24 hr tablet   Oral   Take 1 tablet (300 mg total) by mouth at bedtime.   90 tablet   0   . tamsulosin (FLOMAX) 0.4 MG CAPS capsule   Oral   Take 1 capsule by mouth daily.         Marland Kitchen tiotropium (SPIRIVA) 18 MCG inhalation capsule   Inhalation   Place into inhaler and inhale daily.         Marland Kitchen venlafaxine XR (EFFEXOR-XR) 150 MG 24 hr capsule   Oral   Take 1 capsule (150 mg total) by mouth daily.   90 capsule   0   . zolpidem (AMBIEN) 10 MG tablet   Oral   Take 1 tablet (10 mg total) by mouth at bedtime.   30 tablet   2     Allergies Penicillins  Family History   Problem Relation Age of Onset  . COPD Mother   . Lung cancer Father   . Kidney disease Neg Hx   . Prostate cancer Neg Hx     Social History Social History  Substance Use Topics  . Smoking status: Former Research scientist (life sciences)  . Smokeless tobacco: Never Used     Comment: quit 18 years ago  . Alcohol Use: No    Review of Systems  Constitutional: Negative for fever.   Respiratory: Negative for shortness of breath. Gastrointestinal: Positive for bloating and constipation Genitourinary: Negative for dysuria. Musculoskeletal: Negative for back pain. Skin: Negative for rash. Neurological: Negative for headaches or focal weakness Psychiatric: Positive for anxiety    ____________________________________________   PHYSICAL EXAM:  VITAL SIGNS: ED Triage Vitals  Enc Vitals Group     BP 03/16/15 1215 143/93 mmHg     Pulse Rate 03/16/15 1215 95     Resp 03/16/15 1215 20     Temp 03/16/15 1215 97.3 F (36.3 C)     Temp Source 03/16/15 1215 Oral     SpO2 03/16/15 1215 92 %     Weight 03/16/15 1215 150 lb (68.04 kg)     Height 03/16/15 1215 '5\' 7"'$  (1.702 m)     Head Cir --      Peak Flow --      Pain Score 03/16/15 1217 0     Pain Loc --      Pain Edu? --      Excl. in [place]? --      Constitutional: Alert and oriented. Well appearing and in no distress. Eyes: Conjunctivae are normal.  ENT   Head: Normocephalic and atraumatic.   Mouth/Throat: Mucous membranes are moist. Cardiovascular: Normal rate, regular rhythm. Normal and symmetric distal pulses are present in all extremities. No murmurs, rubs, or gallops. Respiratory: Normal respiratory effort without tachypnea nor retractions. Breath sounds are clear and equal bilaterally.  Gastrointestinal: Soft and non-tender in all quadrants. No distention. There is no CVA tenderness. Genitourinary: deferred Musculoskeletal: Nontender with normal range of motion in all extremities.  Neurologic:  Normal speech and language. No gross focal  neurologic deficits are appreciated. Skin:  Skin is warm, dry and intact. No rash noted. Psychiatric: Mood and affect are normal. Patient exhibits appropriate insight and judgment.  ____________________________________________    LABS (pertinent positives/negatives)  Labs Reviewed - No data to display  ____________________________________________  EKG  None  ____________________________________________    RADIOLOGY I have personally reviewed any xrays that were ordered on this patient: Abdominal x-ray shows mild constipation, normal gas pattern  ____________________________________________   PROCEDURES  Procedure(s) performed: none  Critical Care performed: none  ____________________________________________   INITIAL IMPRESSION / ASSESSMENT AND PLAN / ED COURSE  Pertinent labs & imaging results that were available during my care of the patient were reviewed by me and considered in my medical decision making (see chart for details).  Patient presents with 4-5 days of constipation. We will obtain abdominal x-ray and reevaluate  Abdominal x-ray unremarkable. Not a candidate for disimpaction. I recommended increased fiber diet and magnesium citrate. Outpatient follow-up is appropriate.  ____________________________________________   FINAL CLINICAL IMPRESSION(S) / ED DIAGNOSES  Final diagnoses:  Constipation     Lavonia Drafts, MD 03/16/15 1445

## 2015-03-16 NOTE — Discharge Instructions (Signed)
Constipation, Adult °Constipation is when a person has fewer than three bowel movements a week, has difficulty having a bowel movement, or has stools that are dry, hard, or larger than normal. As people grow older, constipation is more common. A low-fiber diet, not taking in enough fluids, and taking certain medicines may make constipation worse.  °CAUSES  °· Certain medicines, such as antidepressants, pain medicine, iron supplements, antacids, and water pills.   °· Certain diseases, such as diabetes, irritable bowel syndrome (IBS), thyroid disease, or depression.   °· Not drinking enough water.   °· Not eating enough fiber-rich foods.   °· Stress or travel.   °· Lack of physical activity or exercise.   °· Ignoring the urge to have a bowel movement.   °· Using laxatives too much.   °SIGNS AND SYMPTOMS  °· Having fewer than three bowel movements a week.   °· Straining to have a bowel movement.   °· Having stools that are hard, dry, or larger than normal.   °· Feeling full or bloated.   °· Pain in the lower abdomen.   °· Not feeling relief after having a bowel movement.   °DIAGNOSIS  °Your health care provider will take a medical history and perform a physical exam. Further testing may be done for severe constipation. Some tests may include: °· A barium enema X-ray to examine your rectum, colon, and, sometimes, your small intestine.   °· A sigmoidoscopy to examine your lower colon.   °· A colonoscopy to examine your entire colon. °TREATMENT  °Treatment will depend on the severity of your constipation and what is causing it. Some dietary treatments include drinking more fluids and eating more fiber-rich foods. Lifestyle treatments may include regular exercise. If these diet and lifestyle recommendations do not help, your health care provider may recommend taking over-the-counter laxative medicines to help you have bowel movements. Prescription medicines may be prescribed if over-the-counter medicines do not work.    °HOME CARE INSTRUCTIONS  °· Eat foods that have a lot of fiber, such as fruits, vegetables, whole grains, and beans. °· Limit foods high in fat and processed sugars, such as french fries, hamburgers, cookies, candies, and soda.   °· A fiber supplement may be added to your diet if you cannot get enough fiber from foods.   °· Drink enough fluids to keep your urine clear or pale yellow.   °· Exercise regularly or as directed by your health care provider.   °· Go to the restroom when you have the urge to go. Do not hold it.   °· Only take over-the-counter or prescription medicines as directed by your health care provider. Do not take other medicines for constipation without talking to your health care provider first.   °SEEK IMMEDIATE MEDICAL CARE IF:  °· You have bright red blood in your stool.   °· Your constipation lasts for more than 4 days or gets worse.   °· You have abdominal or rectal pain.   °· You have thin, pencil-like stools.   °· You have unexplained weight loss. °MAKE SURE YOU:  °· Understand these instructions. °· Will watch your condition. °· Will get help right away if you are not doing well or get worse. °  °This information is not intended to replace advice given to you by your health care provider. Make sure you discuss any questions you have with your health care provider. °  °Document Released: 01/01/2004 Document Revised: 04/25/2014 Document Reviewed: 01/14/2013 °Elsevier Interactive Patient Education ©2016 [employer] ° °High-Fiber Diet °Fiber, also called dietary fiber, is a type of carbohydrate found in fruits, vegetables, whole grains, and   beans. A high-fiber diet can have many health benefits. Your health care provider may recommend a high-fiber diet to help: °· Prevent constipation. Fiber can make your bowel movements more regular. °· Lower your cholesterol. °· Relieve hemorrhoids, uncomplicated diverticulosis, or irritable bowel syndrome. °· Prevent overeating as part of a weight-loss  plan. °· Prevent heart disease, type 2 diabetes, and certain cancers. °WHAT IS MY PLAN? °The recommended daily intake of fiber includes: °· 38 grams for men under age 50. °· 30 grams for men over age 50. °· 25 grams for women under age 50. °· 21 grams for women over age 50. °You can get the recommended daily intake of dietary fiber by eating a variety of fruits, vegetables, grains, and beans. Your health care provider may also recommend a fiber supplement if it is not possible to get enough fiber through your diet. °WHAT DO I NEED TO KNOW ABOUT A HIGH-FIBER DIET? °· Fiber supplements have not been widely studied for their effectiveness, so it is better to get fiber through food sources. °· Always check the fiber content on the nutrition facts label of any prepackaged food. Look for foods that contain at least 5 grams of fiber per serving. °· Ask your dietitian if you have questions about specific foods that are related to your condition, especially if those foods are not listed in the following section. °· Increase your daily fiber consumption gradually. Increasing your intake of dietary fiber too quickly may cause bloating, cramping, or gas. °· Drink plenty of water. Water helps you to digest fiber. °WHAT FOODS CAN I EAT? °Grains °Whole-grain breads. Multigrain cereal. Oats and oatmeal. Brown rice. Barley. Bulgur wheat. Millet. Bran muffins. Popcorn. Rye wafer crackers. °Vegetables °Sweet potatoes. Spinach. Kale. Artichokes. Cabbage. Broccoli. Green peas. Carrots. Squash. °Fruits °Berries. Pears. Apples. Oranges. Avocados. Prunes and raisins. Dried figs. °Meats and Other Protein Sources °Navy, kidney, pinto, and soy beans. Split peas. Lentils. Nuts and seeds. °Dairy °Fiber-fortified yogurt. °Beverages °Fiber-fortified soy milk. Fiber-fortified orange juice. °Other °Fiber bars. °The items listed above may not be a complete list of recommended foods or beverages. Contact your dietitian for more options. °WHAT FOODS  ARE NOT RECOMMENDED? °Grains °White bread. Pasta made with refined flour. White rice. °Vegetables °Fried potatoes. Canned vegetables. Well-cooked vegetables.  °Fruits °Fruit juice. Cooked, strained fruit. °Meats and Other Protein Sources °Fatty cuts of meat. Fried poultry or fried fish. °Dairy °Milk. Yogurt. Cream cheese. Sour cream. °Beverages °Soft drinks. °Other °Cakes and pastries. Butter and oils. °The items listed above may not be a complete list of foods and beverages to avoid. Contact your dietitian for more information. °WHAT ARE SOME TIPS FOR INCLUDING HIGH-FIBER FOODS IN MY DIET? °· Eat a wide variety of high-fiber foods. °· Make sure that half of all grains consumed each day are whole grains. °· Replace breads and cereals made from refined flour or white flour with whole-grain breads and cereals. °· Replace white rice with brown rice, bulgur wheat, or millet. °· Start the day with a breakfast that is high in fiber, such as a cereal that contains at least 5 grams of fiber per serving. °· Use beans in place of meat in soups, salads, or pasta. °· Eat high-fiber snacks, such as berries, raw vegetables, nuts, or popcorn. °  °This information is not intended to replace advice given to you by your health care provider. Make sure you discuss any questions you have with your health care provider. °  °Document Released: 04/04/2005 Document Revised: 04/25/2014 Document   Reviewed: 09/17/2013 °Elsevier Interactive Patient Education ©2016 Elsevier Inc. ° °

## 2015-03-26 ENCOUNTER — Inpatient Hospital Stay: Payer: Medicare HMO | Attending: Oncology

## 2015-03-26 ENCOUNTER — Inpatient Hospital Stay: Payer: Medicare HMO

## 2015-03-26 ENCOUNTER — Encounter: Payer: Self-pay | Admitting: Oncology

## 2015-03-26 ENCOUNTER — Inpatient Hospital Stay (HOSPITAL_BASED_OUTPATIENT_CLINIC_OR_DEPARTMENT_OTHER): Payer: Medicare HMO | Admitting: Oncology

## 2015-03-26 VITALS — BP 103/64 | HR 99 | Temp 95.2°F | Resp 18 | Wt 144.6 lb

## 2015-03-26 DIAGNOSIS — Z79899 Other long term (current) drug therapy: Secondary | ICD-10-CM | POA: Diagnosis not present

## 2015-03-26 DIAGNOSIS — Z5111 Encounter for antineoplastic chemotherapy: Secondary | ICD-10-CM | POA: Diagnosis present

## 2015-03-26 DIAGNOSIS — C3490 Malignant neoplasm of unspecified part of unspecified bronchus or lung: Secondary | ICD-10-CM

## 2015-03-26 DIAGNOSIS — Z87891 Personal history of nicotine dependence: Secondary | ICD-10-CM

## 2015-03-26 DIAGNOSIS — C3492 Malignant neoplasm of unspecified part of left bronchus or lung: Secondary | ICD-10-CM

## 2015-03-26 DIAGNOSIS — Z9221 Personal history of antineoplastic chemotherapy: Secondary | ICD-10-CM | POA: Diagnosis not present

## 2015-03-26 DIAGNOSIS — R21 Rash and other nonspecific skin eruption: Secondary | ICD-10-CM | POA: Diagnosis not present

## 2015-03-26 DIAGNOSIS — Z923 Personal history of irradiation: Secondary | ICD-10-CM | POA: Diagnosis not present

## 2015-03-26 DIAGNOSIS — C3412 Malignant neoplasm of upper lobe, left bronchus or lung: Secondary | ICD-10-CM | POA: Insufficient documentation

## 2015-03-26 DIAGNOSIS — J449 Chronic obstructive pulmonary disease, unspecified: Secondary | ICD-10-CM | POA: Insufficient documentation

## 2015-03-26 LAB — COMPREHENSIVE METABOLIC PANEL
ALBUMIN: 3.6 g/dL (ref 3.5–5.0)
ALK PHOS: 65 U/L (ref 38–126)
ALT: 16 U/L — ABNORMAL LOW (ref 17–63)
ANION GAP: 8 (ref 5–15)
AST: 19 U/L (ref 15–41)
BILIRUBIN TOTAL: 0.2 mg/dL — AB (ref 0.3–1.2)
BUN: 18 mg/dL (ref 6–20)
CALCIUM: 8.6 mg/dL — AB (ref 8.9–10.3)
CO2: 27 mmol/L (ref 22–32)
Chloride: 103 mmol/L (ref 101–111)
Creatinine, Ser: 0.82 mg/dL (ref 0.61–1.24)
GFR calc Af Amer: >60 mL/min (ref >60)
GLUCOSE: 94 mg/dL (ref 65–99)
POTASSIUM: 3.9 mmol/L (ref 3.5–5.1)
Sodium: 138 mmol/L (ref 135–145)
TOTAL PROTEIN: 7 g/dL (ref 6.5–8.1)

## 2015-03-26 LAB — CBC WITH DIFFERENTIAL/PLATELET
BASOS PCT: 1 %
Basophils Absolute: 0 10*3/uL (ref 0–0.1)
EOS ABS: 0.1 10*3/uL (ref 0–0.7)
EOS PCT: 2 %
HEMATOCRIT: 37.5 % — AB (ref 40.0–52.0)
HEMOGLOBIN: 12.4 g/dL — AB (ref 13.0–18.0)
LYMPHS ABS: 1 10*3/uL (ref 1.0–3.6)
Lymphocytes Relative: 19 %
MCH: 30.4 pg (ref 26.0–34.0)
MCHC: 33.1 g/dL (ref 32.0–36.0)
MCV: 91.8 fL (ref 80.0–100.0)
MONOS PCT: 12 %
Monocytes Absolute: 0.6 10*3/uL (ref 0.2–1.0)
Neutro Abs: 3.5 10*3/uL (ref 1.4–6.5)
Neutrophils Relative %: 66 %
Platelets: 127 10*3/uL — ABNORMAL LOW (ref 150–440)
RBC: 4.08 MIL/uL — ABNORMAL LOW (ref 4.40–5.90)
RDW: 15.2 % — AB (ref 11.5–14.5)
WBC: 5.3 10*3/uL (ref 3.8–10.6)

## 2015-03-26 LAB — MAGNESIUM: MAGNESIUM: 1.9 mg/dL (ref 1.7–2.4)

## 2015-03-26 MED ORDER — ATEZOLIZUMAB CHEMO INJECTION 1200 MG/20ML
1200.0000 mg | Freq: Once | INTRAVENOUS | Status: AC
  Administered 2015-03-26: 1200 mg via INTRAVENOUS
  Filled 2015-03-26: qty 20

## 2015-03-26 MED ORDER — SODIUM CHLORIDE 0.9 % IV SOLN
Freq: Once | INTRAVENOUS | Status: AC
  Administered 2015-03-26: 10:00:00 via INTRAVENOUS
  Filled 2015-03-26: qty 1000

## 2015-03-26 MED ORDER — KETOCONAZOLE-HYDROCORTISONE 2 & 1 % EX KIT: 1.0000 "application " | PACK | Freq: Two times a day (BID) | CUTANEOUS | Status: DC

## 2015-03-26 MED ORDER — NYSTATIN 100000 UNIT/GM EX CREA: 1.0000 "application " | TOPICAL_CREAM | Freq: Two times a day (BID) | CUTANEOUS | Status: DC

## 2015-03-26 NOTE — Progress Notes (Signed)
[place] @ [facility] [phone]  Fax:([phone]) [place]: Mar 26, 1930  MR#: [id number]  BSJ#:[id number]  Patient Care Team: Albina Billet, MD as PCP - General (Internal Medicine)  CHIEF COMPLAINT:  Chief Complaint  Patient presents with  . Lung Cancer    Oncology History   1.  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 Further staging workup with PET scan pending (diagnosis based on  CT scan ) July, 2016 2. SB RT for a T1 lesion of the left lung superior segment left upper lobe Biopsy was consistent with non-small cell carcinoma of lung in February of 2015 3.  Started radiation and chemotherapy for left upper lobe recurrent disease versus second primary from November 10, 2014   4.  Patient has finished total 6 weekly cycle of carboplatinum and Taxol on December 15, 2014 Will   Be finished   With radiation therapy by December 21, 2014 5.  Persistent disease and persistent pain.  Patient has been started on TECENTRIQ from March 05, 2015     Oncology Flowsheet 11/24/2014 12/01/2014 12/08/2014 12/15/2014 12/17/2014 01/12/2015 03/05/2015  Day, Cycle Day 1, Cycle 3 Day 1, Cycle 4 Day 1, 5 Day 1, 5 - - -  atezolizumab (TECENTRIQ) IV - - - - - - 1,200 mg  CARBOplatin (PARAPLATIN) IV 160 mg 160 mg 160 mg 160 mg - - -  dexamethasone (DECADRON) IV [ 20 mg ] 20 mg 20 mg - 10 mg - -  enoxaparin (LOVENOX) [place] - - - - - - -  LORazepam (ATIVAN) IV - - - - - 0.5 mg -  LORazepam (ATIVAN) PO - - - - - - -  ondansetron (ZOFRAN) IV [ 16 mg ] - - [ 8 mg ] - - -  PACLitaxel (TAXOL) IV 45 mg/m2 45 mg/m2 45 mg/m2 45 mg/m2 - - -  prochlorperazine (COMPAZINE) PO - 10 mg 10 mg - - - -    INTERVAL HISTORY: 81 year old gentleman was started on chemotherapy with platinum and Taxol starting from July of 2016.  Patient is here for ongoing evaluation and treatment consideration.  Appetite is improved.  Chest pain is improved.  No nausea.  No vomiting.  No diarrhea.  Had a  repeat PET scan done which has been independently reviewed and reviewed with the patient.. Patient is here for ongoing evaluation and treatment consideration.  Will proceed with chemotherapy patient and number of questions which will be answered.  No nausea no vomiting.  Discomfort in the left upper chest wall area persist  Patient has developed a rash in the lower extremity and left upper extremity.  Not sure whether this started after chemotherapy or not. Tolerated last chemotherapy with   tecentriq very well.  Without any significant nausea vomiting diarrhea.  Chest wall pain is improved.   PERFORMANCE STATUS (ECOG):01 HEENT:  No visual changes, runny nose, sore throat, mouth sores or tenderness. Lungs: Dry hacking cough.  No hemoptysis. Cardiac:  No chest pain, palpitations, orthopnea, or PND. GI:  No nausea, vomiting, diarrhea, constipation, melena or hematochezia. GU:  No urgency, frequency, dysuria, or hematuria. Musculoskeletal:  Left shoulder pain Extremities:  No pain or swelling. Skin: Rash in the left upper extremity left lower extremity Neuro:  No headache, numbness or weakness, balance or coordination issues. Endocrine:  No diabetes, thyroid issues, hot flashes or night sweats. Psych:  No mood changes, depression or anxiety. Pain:  No focal pain.  Review of systems:  All other systems reviewed and found to be negative. As per HPI. Otherwise, a complete review of systems is negatve.  PAST MEDICAL HISTORY: Past Medical History  Diagnosis Date  . Major depressive disorder, recurrent, severe with psychotic features ([place])   . Persistent disorder of initiating or maintaining sleep   . Generalized anxiety disorder   . Major depressive disorder, recurrent, severe with psychotic features ([place])   . Persistent disorder of initiating or maintaining sleep   . Asthma   . Cancer ([place])     Left lung  . COPD (chronic obstructive pulmonary disease) ([place])     PAST SURGICAL  HISTORY: Past Surgical History  Procedure Laterality Date  . Hemorroidectomy    . Lung surgery      FAMILY HISTORY Family History  Problem Relation Age of Onset  . COPD Mother   . Lung cancer Father   . Kidney disease Neg Hx   . Prostate cancer Neg Hx     ADVANCED DIRECTIVES:  Patient does have advance healthcare directive, Patient   does not desire to make any changes HEALTH MAINTENANCE: Social History  Substance Use Topics  . Smoking status: Former Research scientist (life sciences)  . Smokeless tobacco: Never Used     Comment: quit 18 years ago  . Alcohol Use: No      Allergies  Allergen Reactions  . Penicillins     Current Outpatient Prescriptions  Medication Sig Dispense Refill  . albuterol (PROVENTIL HFA;VENTOLIN HFA) 108 (90 BASE) MCG/ACT inhaler Inhale 1-2 puffs into the lungs QID.     Marland Kitchen atorvastatin (LIPITOR) 40 MG tablet Take 1 tablet by mouth daily.    Marland Kitchen econazole nitrate 1 % cream Apply 1 application topically 2 (two) times daily.     Marland Kitchen esomeprazole (NEXIUM) 40 MG capsule Take 40 mg by mouth daily at 12 noon.    . finasteride (PROSCAR) 5 MG tablet Take 1 tablet (5 mg total) by mouth daily. 30 tablet 12  . HYDROcodone-acetaminophen (NORCO/VICODIN) 5-325 MG per tablet Take 1 tablet by mouth every 4 (four) hours as needed for moderate pain. 30 tablet 0  . LORazepam (ATIVAN) 1 MG tablet Take 1 tablet 3 times a day and one half a tablet once a day. 315 tablet 0  . magnesium citrate SOLN Take 296 mLs (1 Bottle total) by mouth once. 195 mL 2  . mometasone-formoterol (DULERA) 200-5 MCG/ACT AERO Inhale 2 puffs into the lungs 2 (two) times daily.     . Polyethylene Glycol 3350 GRAN     . QUEtiapine (SEROQUEL XR) 300 MG 24 hr tablet Take 1 tablet (300 mg total) by mouth at bedtime. 90 tablet 0  . tamsulosin (FLOMAX) 0.4 MG CAPS capsule Take 1 capsule by mouth daily.    Marland Kitchen tiotropium (SPIRIVA) 18 MCG inhalation capsule Place into inhaler and inhale daily.    Marland Kitchen venlafaxine XR (EFFEXOR-XR) 150 MG 24  hr capsule Take 1 capsule (150 mg total) by mouth daily. 90 capsule 0  . zolpidem (AMBIEN) 10 MG tablet Take 1 tablet (10 mg total) by mouth at bedtime. 30 tablet 2  . Ketoconazole-Hydrocortisone 2 & 1 % KIT Apply 1 application topically 2 (two) times daily. 67.7 g 0   No current facility-administered medications for this visit.    OBJECTIVE:  Filed Vitals:   03/26/15 0840  BP: 103/64  Pulse: 99  Temp: 95.2 F (35.1 C)  Resp: 18     Body mass index is 22.65 kg/(m^2).  ECOG FS:1 - Symptomatic but completely ambulatory  PHYSICAL EXAM: GENERAL:  Well developed, well nourished, sitting comfortably in the exam room in no acute distress. MENTAL STATUS:  Alert and oriented to person, place and time. HEAD: Alopecia  Normocephalic, atraumatic, face symmetric, no Cushingoid features. EYES:  Pupils equal round and reactive to light and accomodation.  No conjunctivitis or scleral icterus. ENT:  Oropharynx clear without lesion.  Tongue normal. Mucous membranes moist.  RESPIRATORY:  Clear to auscultation without rales, wheezes or rhonchi. CARDIOVASCULAR:  Regular rate and rhythm without murmur, rub or gallop. BREAST:  Right breast without masses, skin changes or nipple discharge.  Left breast without masses, skin changes or nipple discharge. ABDOMEN:  Soft, non-tender, with active bowel sounds, and no hepatosplenomegaly.  No masses. BACK:  No CVA tenderness.  No tenderness on percussion of the back or rib cage. SKIN: Maculopapular rash. EXTREMITIES: No edema, no skin discoloration or tenderness.  No palpable cords. LYMPH NODES: No palpable cervical, supraclavicular, axillary or inguinal adenopathy  NEUROLOGICAL: Unremarkable. PSYCH:  Appropriate.   LAB RESULTS:  Appointment on 03/26/2015  Component Date Value Ref Range Status  . WBC 03/26/2015 5.3  3.8 - 10.6 K/uL Final  . RBC 03/26/2015 4.08* 4.40 - 5.90 MIL/uL Final  . Hemoglobin 03/26/2015 12.4* 13.0 - 18.0 g/dL Final  . HCT  03/26/2015 37.5* 40.0 - 52.0 % Final  . MCV 03/26/2015 91.8  80.0 - 100.0 fL Final  . MCH 03/26/2015 30.4  26.0 - 34.0 pg Final  . MCHC 03/26/2015 33.1  32.0 - 36.0 g/dL Final  . RDW 03/26/2015 15.2* 11.5 - 14.5 % Final  . Platelets 03/26/2015 127* 150 - 440 K/uL Final  . Neutrophils Relative % 03/26/2015 66   Final  . Neutro Abs 03/26/2015 3.5  1.4 - 6.5 K/uL Final  . Lymphocytes Relative 03/26/2015 19   Final  . Lymphs Abs 03/26/2015 1.0  1.0 - 3.6 K/uL Final  . Monocytes Relative 03/26/2015 12   Final  . Monocytes Absolute 03/26/2015 0.6  0.2 - 1.0 K/uL Final  . Eosinophils Relative 03/26/2015 2   Final  . Eosinophils Absolute 03/26/2015 0.1  0 - 0.7 K/uL Final  . Basophils Relative 03/26/2015 1   Final  . Basophils Absolute 03/26/2015 0.0  0 - 0.1 K/uL Final  . Sodium 03/26/2015 138  135 - 145 mmol/L Final  . Potassium 03/26/2015 3.9  3.5 - 5.1 mmol/L Final  . Chloride 03/26/2015 103  101 - 111 mmol/L Final  . CO2 03/26/2015 27  22 - 32 mmol/L Final  . Glucose, Bld 03/26/2015 94  65 - 99 mg/dL Final  . BUN 03/26/2015 18  6 - 20 mg/dL Final  . Creatinine, Ser 03/26/2015 0.82  0.61 - 1.24 mg/dL Final  . Calcium 03/26/2015 8.6* 8.9 - 10.3 mg/dL Final  . Total Protein 03/26/2015 7.0  6.5 - 8.1 g/dL Final  . Albumin 03/26/2015 3.6  3.5 - 5.0 g/dL Final  . AST 03/26/2015 19  15 - 41 U/L Final  . ALT 03/26/2015 16* 17 - 63 U/L Final  . Alkaline Phosphatase 03/26/2015 65  38 - 126 U/L Final  . Total Bilirubin 03/26/2015 0.2* 0.3 - 1.2 mg/dL Final  . GFR calc non Af Amer 03/26/2015 >60  >60 mL/min Final  . GFR calc Af Amer 03/26/2015 >60  >60 mL/min Final   Comment: (NOTE) The eGFR has been calculated using the CKD EPI equation. This calculation has not been validated in all clinical  situations. eGFR's persistently <60 mL/min signify possible Chronic Kidney Disease.   . Anion gap 03/26/2015 8  5 - 15 Final  . Magnesium 03/26/2015 1.9  1.7 - 2.4 mg/dL Final       STUDIES: Guidance study., TP3,BRAF,JAK3,BRCA1 mutated in the blood testing (August, 2016) ASSESSMENT: Recurrent or another primary with left upper lobe invading vertebral body.   Rash appears most likely due to fungal infection we will give Econazole and steroid ointment.  I doubt is related to chemotherapy but will continue to monitor  ContinueTECENTRIQ Repeat PET scan after 3 cycles of chemotherapy     Carcinoma, lung   Staging form: Lung, AJCC 7th Edition    Clinical: Stage IIIA (T4, N0, M0) - Signed by Forest Gleason, MD on 10/29/2014   Forest Gleason, MD   03/26/2015 9:07 AM

## 2015-03-26 NOTE — Addendum Note (Signed)
Addended by: Telford Nab on: 03/26/2015 02:52 PM   Modules accepted: Orders, Medications

## 2015-03-26 NOTE — Progress Notes (Signed)
Patient has an area on his left forearm that he wants MD to assess.  Red, rash that does not itch.  Patient continues to cough.  SOB better today.

## 2015-04-07 ENCOUNTER — Encounter: Payer: Self-pay | Admitting: Psychiatry

## 2015-04-07 ENCOUNTER — Telehealth: Payer: Self-pay | Admitting: *Deleted

## 2015-04-07 ENCOUNTER — Ambulatory Visit (INDEPENDENT_AMBULATORY_CARE_PROVIDER_SITE_OTHER): Payer: Medicare HMO | Admitting: Psychiatry

## 2015-04-07 VITALS — BP 138/76 | HR 92 | Temp 97.0°F | Ht 67.0 in | Wt 148.4 lb

## 2015-04-07 DIAGNOSIS — F333 Major depressive disorder, recurrent, severe with psychotic symptoms: Secondary | ICD-10-CM

## 2015-04-07 DIAGNOSIS — F411 Generalized anxiety disorder: Secondary | ICD-10-CM

## 2015-04-07 DIAGNOSIS — G4700 Insomnia, unspecified: Secondary | ICD-10-CM

## 2015-04-07 MED ORDER — VENLAFAXINE HCL ER 150 MG PO CP24: 150.0000 mg | ORAL_CAPSULE | Freq: Every day | ORAL | Status: DC

## 2015-04-07 MED ORDER — LORAZEPAM 1 MG PO TABS: ORAL_TABLET | ORAL | Status: DC

## 2015-04-07 MED ORDER — ZOLPIDEM TARTRATE 10 MG PO TABS: 10.0000 mg | ORAL_TABLET | Freq: Every day | ORAL | Status: DC

## 2015-04-07 MED ORDER — QUETIAPINE FUMARATE ER 300 MG PO TB24: 300.0000 mg | ORAL_TABLET | Freq: Every day | ORAL | Status: DC

## 2015-04-07 MED ORDER — LORAZEPAM 0.5 MG PO TABS: 0.5000 mg | ORAL_TABLET | Freq: Three times a day (TID) | ORAL | Status: DC

## 2015-04-07 NOTE — Progress Notes (Signed)
[place] MD/PA/NP OP Progress Note  04/07/2015 9:15 AM Timothy Ali  MRN:  [id number]  Subjective:  Patient presents for follow-up of his generalized anxiety disorder, insomnia and major depressive disorder. He indicates he feels positive and optimistic about his chemotherapy and feels he might feel "a bit better." He indicated that he has decreased his daytime dose of lorazepam. He states he's taking a half a milligram 3 times throughout the day and then he states in 1 mg at bedtime. Indicates they'll take another milligram at bedtime if he wakes up. He states that sleep is probably one of his biggest issues. I spent time discussing with him that I thought it was good he was decreasing his daytime doses and ultimately that he will need to decrease his lorazepam. Patient indicated he does not want to do that. I indicated that over time that the best thing. Patient stated that if anyone decreases his lorazepam he'll just "go somewhere else."  Spent some time discussing his activities. He states that his daughter did come down from [place] and had dinner with him this past week. He states that his sister that he somewhat close to is now in [place] as they spend the winters down there and the summers here [place]. She states is another sister in [place] who is not close to. As he does have a morning routine of going to a restaurant for breakfast and eating with some friends.  Given his significant stressor of cancer and current treatment his are ready baseline anxiety is likely high and thus were given to continue home on his lorazepam without any changes. In addition he is not showing any signs of confusion or mobility issues secondary to the lorazepam.  Chief Complaint: sleep Chief Complaint    Follow-up; Medication Refill     Visit Diagnosis:     ICD-9-CM ICD-10-CM   1. GAD (generalized anxiety disorder) 300.02 F41.1   2. Severe recurrent major depressive disorder with psychotic features ([place])  296.34 F33.3   3. Insomnia 780.52 G47.00     Past Medical History:  Past Medical History  Diagnosis Date  . Major depressive disorder, recurrent, severe with psychotic features ([place])   . Persistent disorder of initiating or maintaining sleep   . Generalized anxiety disorder   . Major depressive disorder, recurrent, severe with psychotic features ([place])   . Persistent disorder of initiating or maintaining sleep   . Asthma   . Cancer ([place])     Left lung  . COPD (chronic obstructive pulmonary disease) [facility])     Past Surgical History  Procedure Laterality Date  . Hemorroidectomy    . Lung surgery     Family History:  Family History  Problem Relation Age of Onset  . COPD Mother   . Lung cancer Father   . Kidney disease Neg Hx   . Prostate cancer Neg Hx    Social History:  Social History   Social History  . Marital Status: Widowed    Spouse Name: N/A  . Number of Children: N/A  . Years of Education: N/A   Social History Main Topics  . Smoking status: Former Research scientist (life sciences)  . Smokeless tobacco: Never Used     Comment: quit 18 years ago  . Alcohol Use: No  . Drug Use: No  . Sexual Activity: No   Other Topics Concern  . None   Social History Narrative   Additional History:   Assessment:   Musculoskeletal: Strength & Muscle Tone: within  normal limits Gait & Station: normal Patient leans: N/A  Psychiatric Specialty Exam: Anxiety Symptoms include nervous/anxious behavior. Patient reports no insomnia (doing well with Ambien) or suicidal ideas.    Depression        Associated symptoms include does not have insomnia (doing well with Ambien) and no suicidal ideas.  Past medical history includes anxiety.     Review of Systems  Psychiatric/Behavioral: Negative for depression, suicidal ideas, hallucinations, memory loss and substance abuse. The patient is nervous/anxious. The patient does not have insomnia (doing well with Ambien).   All other systems reviewed and are  negative.   Blood pressure 138/76, pulse 92, temperature 97 F (36.1 C), temperature source Tympanic, height '5\' 7"'$  (1.702 m), weight 148 lb 6.4 oz (67.314 kg), SpO2 85 %.Body mass index is 23.24 kg/(m^2).  General Appearance: Neat and Well Groomed  Eye Contact:  Good  Speech:  Clear and Coherent and Normal Rate  Volume:  Normal  Mood:  good  Affect:  Congruent  Thought Process:  Linear and Logical  Orientation:  Full (Time, Place, and Person)  Thought Content:  Not verbalizing paranoid ideation as in past visit  Suicidal Thoughts:  No  Homicidal Thoughts:  No  Memory:  Immediate;   Good Recent;   Good Remote;   Good  Judgement:  Good  Insight:  Fair  Psychomotor Activity:  Negative  Concentration:  Good  Recall:  Good  Fund of Knowledge: Good  Language: Good  Akathisia:  Negative  Handed:  Right unknown  AIMS (if indicated):  Not done  Assets:  Desire for Improvement  ADL's:  Intact  Cognition: WNL  Sleep:  5 hour per night   Is the patient at risk to self?  No. Has the patient been a risk to self in the past 6 months?  No. Has the patient been a risk to self within the distant past?  No. Is the patient a risk to others?  No. Has the patient been a risk to others in the past 6 months?  No. Has the patient been a risk to others within the distant past?  No.  Current Medications: Current Outpatient Prescriptions  Medication Sig Dispense Refill  . albuterol (PROVENTIL HFA;VENTOLIN HFA) 108 (90 BASE) MCG/ACT inhaler Inhale 1-2 puffs into the lungs QID.     Marland Kitchen atorvastatin (LIPITOR) 40 MG tablet Take 1 tablet by mouth daily.    Marland Kitchen econazole nitrate 1 % cream Apply 1 application topically 2 (two) times daily.     Marland Kitchen esomeprazole (NEXIUM) 40 MG capsule Take 40 mg by mouth daily at 12 noon.    . finasteride (PROSCAR) 5 MG tablet Take 1 tablet (5 mg total) by mouth daily. 30 tablet 12  . HYDROcodone-acetaminophen (NORCO/VICODIN) 5-325 MG per tablet Take 1 tablet by mouth every 4  (four) hours as needed for moderate pain. 30 tablet 0  . LORazepam (ATIVAN) 1 MG tablet One tablet at bedtime and one tabelt in the morning as needed. 180 tablet 1  . magnesium citrate SOLN Take 296 mLs (1 Bottle total) by mouth once. 195 mL 2  . mometasone-formoterol (DULERA) 200-5 MCG/ACT AERO Inhale 2 puffs into the lungs 2 (two) times daily.     Marland Kitchen nystatin cream (MYCOSTATIN) Apply 1 application topically 2 (two) times daily. 30 g 0  . Polyethylene Glycol 3350 GRAN     . QUEtiapine (SEROQUEL XR) 300 MG 24 hr tablet Take 1 tablet (300 mg total) by mouth at bedtime.  90 tablet 1  . tamsulosin (FLOMAX) 0.4 MG CAPS capsule Take 1 capsule by mouth daily.    Marland Kitchen tiotropium (SPIRIVA) 18 MCG inhalation capsule Place into inhaler and inhale daily.    Marland Kitchen triamcinolone cream (KENALOG) 0.1 %     . venlafaxine XR (EFFEXOR-XR) 150 MG 24 hr capsule Take 1 capsule (150 mg total) by mouth daily. 90 capsule 1  . zolpidem (AMBIEN) 10 MG tablet Take 1 tablet (10 mg total) by mouth at bedtime. 90 tablet 1  . LORazepam (ATIVAN) 0.5 MG tablet Take 1 tablet (0.5 mg total) by mouth every 8 (eight) hours. 270 tablet 1   No current facility-administered medications for this visit.    Medical Decision Making:  Established Problem, Stable/Improving (1), Review of Medication Regimen & Side Effects (2) and Review of New Medication or Change in Dosage (2)  Treatment Plan Summary:Medication management and Plan    Continue lorazepam. He is now taking a half a milligram 3 times a day and 1 mg at bedtime and another milligram in the early morning if he wakes. We'll continue him on his Effexor XR at 150 mg daily, we'll continue the Seroquel XR 300 mg at bedtime. Continue his Ambien 10 mg at bedtime as needed.    Major depressive disorder, recurrent severe with psychotic features-stable on Effexor and Seroquel.  Generalized anxiety disorder-we'll continue use the Effexor and keep the lorazepam as above.  He will follow-up in 6  weeks and I explained the patient I'll be departing clinic in February. He indicated he would make the point for 6 weeks but stated that his chemotherapy might interfere with that appointment. There be another provider in the clinic to continue his care.   Faith Rogue 04/07/2015, 9:15 AM

## 2015-04-07 NOTE — Telephone Encounter (Signed)
I spoke w/ Timothy Ali around 1:45 PM.  He is self-cathing 6-7x /day and is now having difficulty inserting the catheter.  I recommended that he make an appointment the following day and transferred him to be scheduled; he has an appointment w/Shannon Mcgowan, CPA at 9:30 AM on 04/08/15.

## 2015-04-08 ENCOUNTER — Ambulatory Visit: Payer: Self-pay | Admitting: Urology

## 2015-04-10 ENCOUNTER — Ambulatory Visit: Payer: Medicare HMO | Admitting: Radiation Oncology

## 2015-04-15 ENCOUNTER — Ambulatory Visit: Payer: Self-pay | Admitting: Psychiatry

## 2015-04-23 ENCOUNTER — Inpatient Hospital Stay: Payer: Medicare HMO | Attending: Oncology

## 2015-04-23 ENCOUNTER — Encounter: Payer: Self-pay | Admitting: Oncology

## 2015-04-23 ENCOUNTER — Inpatient Hospital Stay: Payer: Medicare HMO

## 2015-04-23 ENCOUNTER — Inpatient Hospital Stay (HOSPITAL_BASED_OUTPATIENT_CLINIC_OR_DEPARTMENT_OTHER): Payer: Medicare HMO | Admitting: Oncology

## 2015-04-23 VITALS — BP 175/83 | HR 86 | Temp 97.1°F | Resp 20 | Wt 151.0 lb

## 2015-04-23 DIAGNOSIS — Z923 Personal history of irradiation: Secondary | ICD-10-CM

## 2015-04-23 DIAGNOSIS — F333 Major depressive disorder, recurrent, severe with psychotic symptoms: Secondary | ICD-10-CM

## 2015-04-23 DIAGNOSIS — C3412 Malignant neoplasm of upper lobe, left bronchus or lung: Secondary | ICD-10-CM

## 2015-04-23 DIAGNOSIS — Z9221 Personal history of antineoplastic chemotherapy: Secondary | ICD-10-CM | POA: Diagnosis not present

## 2015-04-23 DIAGNOSIS — J45909 Unspecified asthma, uncomplicated: Secondary | ICD-10-CM | POA: Insufficient documentation

## 2015-04-23 DIAGNOSIS — Z8639 Personal history of other endocrine, nutritional and metabolic disease: Secondary | ICD-10-CM

## 2015-04-23 DIAGNOSIS — N429 Disorder of prostate, unspecified: Secondary | ICD-10-CM

## 2015-04-23 DIAGNOSIS — H3530 Unspecified macular degeneration: Secondary | ICD-10-CM

## 2015-04-23 DIAGNOSIS — E876 Hypokalemia: Secondary | ICD-10-CM | POA: Diagnosis not present

## 2015-04-23 DIAGNOSIS — Z87891 Personal history of nicotine dependence: Secondary | ICD-10-CM | POA: Diagnosis not present

## 2015-04-23 DIAGNOSIS — N139 Obstructive and reflux uropathy, unspecified: Secondary | ICD-10-CM

## 2015-04-23 DIAGNOSIS — Z79899 Other long term (current) drug therapy: Secondary | ICD-10-CM | POA: Insufficient documentation

## 2015-04-23 DIAGNOSIS — G4700 Insomnia, unspecified: Secondary | ICD-10-CM

## 2015-04-23 DIAGNOSIS — C3400 Malignant neoplasm of unspecified main bronchus: Secondary | ICD-10-CM

## 2015-04-23 DIAGNOSIS — C3492 Malignant neoplasm of unspecified part of left bronchus or lung: Secondary | ICD-10-CM

## 2015-04-23 DIAGNOSIS — F411 Generalized anxiety disorder: Secondary | ICD-10-CM

## 2015-04-23 DIAGNOSIS — N401 Enlarged prostate with lower urinary tract symptoms: Secondary | ICD-10-CM

## 2015-04-23 DIAGNOSIS — C3490 Malignant neoplasm of unspecified part of unspecified bronchus or lung: Secondary | ICD-10-CM

## 2015-04-23 DIAGNOSIS — J449 Chronic obstructive pulmonary disease, unspecified: Secondary | ICD-10-CM | POA: Insufficient documentation

## 2015-04-23 DIAGNOSIS — Z8719 Personal history of other diseases of the digestive system: Secondary | ICD-10-CM

## 2015-04-23 DIAGNOSIS — R338 Other retention of urine: Secondary | ICD-10-CM

## 2015-04-23 DIAGNOSIS — D72829 Elevated white blood cell count, unspecified: Secondary | ICD-10-CM

## 2015-04-23 DIAGNOSIS — Z859 Personal history of malignant neoplasm, unspecified: Secondary | ICD-10-CM

## 2015-04-23 LAB — CBC WITH DIFFERENTIAL/PLATELET
BASOS ABS: 0.1 10*3/uL (ref 0–0.1)
BASOS PCT: 1 %
Eosinophils Absolute: 0.2 10*3/uL (ref 0–0.7)
Eosinophils Relative: 3 %
HEMATOCRIT: 35.7 % — AB (ref 40.0–52.0)
HEMOGLOBIN: 11.8 g/dL — AB (ref 13.0–18.0)
LYMPHS PCT: 16 %
Lymphs Abs: 1.1 10*3/uL (ref 1.0–3.6)
MCH: 29.5 pg (ref 26.0–34.0)
MCHC: 33 g/dL (ref 32.0–36.0)
MCV: 89.3 fL (ref 80.0–100.0)
MONOS PCT: 9 %
Monocytes Absolute: 0.6 10*3/uL (ref 0.2–1.0)
NEUTROS PCT: 71 %
Neutro Abs: 4.8 10*3/uL (ref 1.4–6.5)
PLATELETS: 145 10*3/uL — AB (ref 150–440)
RBC: 4 MIL/uL — ABNORMAL LOW (ref 4.40–5.90)
RDW: 15.2 % — AB (ref 11.5–14.5)
WBC: 6.7 10*3/uL (ref 3.8–10.6)

## 2015-04-23 LAB — COMPREHENSIVE METABOLIC PANEL
ALT: 15 U/L — AB (ref 17–63)
ANION GAP: 7 (ref 5–15)
AST: 17 U/L (ref 15–41)
Albumin: 3.3 g/dL — ABNORMAL LOW (ref 3.5–5.0)
Alkaline Phosphatase: 70 U/L (ref 38–126)
BUN: 16 mg/dL (ref 6–20)
CHLORIDE: 103 mmol/L (ref 101–111)
CO2: 28 mmol/L (ref 22–32)
Calcium: 8.3 mg/dL — ABNORMAL LOW (ref 8.9–10.3)
Creatinine, Ser: 0.82 mg/dL (ref 0.61–1.24)
Glucose, Bld: 118 mg/dL — ABNORMAL HIGH (ref 65–99)
POTASSIUM: 3.6 mmol/L (ref 3.5–5.1)
SODIUM: 138 mmol/L (ref 135–145)
Total Bilirubin: 0.2 mg/dL — ABNORMAL LOW (ref 0.3–1.2)
Total Protein: 6.6 g/dL (ref 6.5–8.1)

## 2015-04-23 MED ORDER — SODIUM CHLORIDE 0.9 % IV SOLN
Freq: Once | INTRAVENOUS | Status: AC
  Administered 2015-04-23: 15:00:00 via INTRAVENOUS
  Filled 2015-04-23: qty 1000

## 2015-04-23 MED ORDER — SODIUM CHLORIDE 0.9 % IV SOLN
1200.0000 mg | Freq: Once | INTRAVENOUS | Status: AC
  Administered 2015-04-23: 1200 mg via INTRAVENOUS
  Filled 2015-04-23: qty 20

## 2015-04-24 ENCOUNTER — Encounter: Payer: Self-pay | Admitting: Oncology

## 2015-04-24 NOTE — Progress Notes (Signed)
[place] @ [facility] [phone]  Fax:([phone]) [place]: 01/07/1930  MR#: [id number]  DXA#:[id number]  Patient Care Team: Albina Billet, MD as PCP - General (Internal Medicine)  CHIEF COMPLAINT:  Chief Complaint  Patient presents with  . Lung Cancer    Oncology History   1.  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 Further staging workup with PET scan pending (diagnosis based on  CT scan ) July, 2016 2. SB RT for a T1 lesion of the left lung superior segment left upper lobe Biopsy was consistent with non-small cell carcinoma of lung in February of 2015 3.  Started radiation and chemotherapy for left upper lobe recurrent disease versus second primary from November 10, 2014   4.  Patient has finished total 6 weekly cycle of carboplatinum and Taxol on December 15, 2014 Will   Be finished   With radiation therapy by December 21, 2014 5.  Persistent disease and persistent pain.  Patient has been started on TECENTRIQ from March 05, 2015     Oncology Flowsheet 12/08/2014 12/15/2014 12/17/2014 01/12/2015 03/05/2015 03/26/2015 04/23/2015  Day, Cycle Day 1, 5 Day 1, 5 - - - - -  atezolizumab (TECENTRIQ) IV - - - - 1,200 mg 1,200 mg 1,200 mg  CARBOplatin (PARAPLATIN) IV 160 mg 160 mg - - - - -  dexamethasone (DECADRON) IV 20 mg - 10 mg - - - -  enoxaparin (LOVENOX) [place] - - - - - - -  LORazepam (ATIVAN) IV - - - 0.5 mg - - -  LORazepam (ATIVAN) PO - - - - - - -  ondansetron (ZOFRAN) IV - [ 8 mg ] - - - - -  PACLitaxel (TAXOL) IV 45 mg/m2 45 mg/m2 - - - - -  prochlorperazine (COMPAZINE) PO 10 mg - - - - - -    INTERVAL HISTORY: 82 year old gentleman was started on chemotherapy with platinum and Taxol starting from July of 2016.  Patient is here for ongoing evaluation and treatment consideration.  Appetite is improved.  Chest pain is improved.  No nausea.  No vomiting.  No diarrhea.  Had a repeat PET scan done which has been independently reviewed  and reviewed with the patient.. Patient is here for ongoing evaluation and treatment consideration.  Will proceed with chemotherapy patient and number of questions which will be answered.  No nausea no vomiting.  Discomfort in the left upper chest wall area persist  Patient is here to initiate third cycle of chemotherapy with TECENRIQ.  No chills.  No fever.  Rash has improved.  No nausea.  No vomiting.  No diarrhea.  Appetite has been stable. chest has improved.  No cough or shortness of breath   PERFORMANCE STATUS (ECOG):01 HEENT:  No visual changes, runny nose, sore throat, mouth sores or tenderness. Lungs: Dry hacking cough.  No hemoptysis. Cardiac:  No chest pain, palpitations, orthopnea, or PND. GI:  No nausea, vomiting, diarrhea, constipation, melena or hematochezia. GU:  No urgency, frequency, dysuria, or hematuria. Musculoskeletal:  Left shoulder pain Extremities:  No pain or swelling. Skin: Rash in the left upper extremity left lower extremity Neuro:  No headache, numbness or weakness, balance or coordination issues. Endocrine:  No diabetes, thyroid issues, hot flashes or night sweats. Psych:  No mood changes, depression or anxiety. Pain:  No focal pain. Review of systems:  All other systems reviewed and found to be negative. As per HPI.  Otherwise, a complete review of systems is negatve.  PAST MEDICAL HISTORY: Past Medical History  Diagnosis Date  . Major depressive disorder, recurrent, severe with psychotic features ([place])   . Persistent disorder of initiating or maintaining sleep   . Generalized anxiety disorder   . Major depressive disorder, recurrent, severe with psychotic features ([place])   . Persistent disorder of initiating or maintaining sleep   . Asthma   . Cancer ([place])     Left lung  . COPD (chronic obstructive pulmonary disease) ([place])     PAST SURGICAL HISTORY: Past Surgical History  Procedure Laterality Date  . Hemorroidectomy    . Lung surgery       FAMILY HISTORY Family History  Problem Relation Age of Onset  . COPD Mother   . Lung cancer Father   . Kidney disease Neg Hx   . Prostate cancer Neg Hx     ADVANCED DIRECTIVES:  Patient does have advance healthcare directive, Patient   does not desire to make any changes HEALTH MAINTENANCE: Social History  Substance Use Topics  . Smoking status: Former Research scientist (life sciences)  . Smokeless tobacco: Never Used     Comment: quit 18 years ago  . Alcohol Use: No      Allergies  Allergen Reactions  . Penicillins     Current Outpatient Prescriptions  Medication Sig Dispense Refill  . albuterol (PROVENTIL HFA;VENTOLIN HFA) 108 (90 BASE) MCG/ACT inhaler Inhale 1-2 puffs into the lungs QID.     Marland Kitchen atorvastatin (LIPITOR) 40 MG tablet Take 1 tablet by mouth daily.    Marland Kitchen econazole nitrate 1 % cream Apply 1 application topically 2 (two) times daily.     Marland Kitchen esomeprazole (NEXIUM) 40 MG capsule Take 40 mg by mouth daily at 12 noon.    . finasteride (PROSCAR) 5 MG tablet Take 1 tablet (5 mg total) by mouth daily. 30 tablet 12  . HYDROcodone-acetaminophen (NORCO/VICODIN) 5-325 MG per tablet Take 1 tablet by mouth every 4 (four) hours as needed for moderate pain. 30 tablet 0  . LORazepam (ATIVAN) 0.5 MG tablet Take 1 tablet (0.5 mg total) by mouth every 8 (eight) hours. 270 tablet 1  . LORazepam (ATIVAN) 1 MG tablet One tablet at bedtime and one tabelt in the morning as needed. 180 tablet 1  . magnesium citrate SOLN Take 296 mLs (1 Bottle total) by mouth once. 195 mL 2  . mometasone-formoterol (DULERA) 200-5 MCG/ACT AERO Inhale 2 puffs into the lungs 2 (two) times daily.     Marland Kitchen nystatin cream (MYCOSTATIN) Apply 1 application topically 2 (two) times daily. 30 g 0  . Polyethylene Glycol 3350 GRAN     . QUEtiapine (SEROQUEL XR) 300 MG 24 hr tablet Take 1 tablet (300 mg total) by mouth at bedtime. 90 tablet 1  . tamsulosin (FLOMAX) 0.4 MG CAPS capsule Take 1 capsule by mouth daily.    Marland Kitchen tiotropium (SPIRIVA) 18  MCG inhalation capsule Place into inhaler and inhale daily.    Marland Kitchen triamcinolone cream (KENALOG) 0.1 %     . venlafaxine XR (EFFEXOR-XR) 150 MG 24 hr capsule Take 1 capsule (150 mg total) by mouth daily. 90 capsule 1  . zolpidem (AMBIEN) 10 MG tablet Take 1 tablet (10 mg total) by mouth at bedtime. 90 tablet 1   No current facility-administered medications for this visit.    OBJECTIVE:  Filed Vitals:   04/23/15 1340  BP: 175/83  Pulse: 86  Temp: 97.1 F (36.2 C)  Resp: 20  Body mass index is 23.65 kg/(m^2).    ECOG FS:1 - Symptomatic but completely ambulatory  PHYSICAL EXAM: GENERAL:  Well developed, well nourished, sitting comfortably in the exam room in no acute distress. MENTAL STATUS:  Alert and oriented to person, place and time. HEAD: Alopecia  Normocephalic, atraumatic, face symmetric, no Cushingoid features. EYES:  Pupils equal round and reactive to light and accomodation.  No conjunctivitis or scleral icterus. ENT:  Oropharynx clear without lesion.  Tongue normal. Mucous membranes moist.  RESPIRATORY:  Clear to auscultation without rales, wheezes or rhonchi. CARDIOVASCULAR:  Regular rate and rhythm without murmur, rub or gallop. BREAST:  Right breast without masses, skin changes or nipple discharge.  Left breast without masses, skin changes or nipple discharge. ABDOMEN:  Soft, non-tender, with active bowel sounds, and no hepatosplenomegaly.  No masses. BACK:  No CVA tenderness.  No tenderness on percussion of the back or rib cage. SKIN: Maculopapular rash. EXTREMITIES: No edema, no skin discoloration or tenderness.  No palpable cords. LYMPH NODES: No palpable cervical, supraclavicular, axillary or inguinal adenopathy  NEUROLOGICAL: Unremarkable. PSYCH:  Appropriate.   LAB RESULTS:  Appointment on 04/23/2015  Component Date Value Ref Range Status  . WBC 04/23/2015 6.7  3.8 - 10.6 K/uL Final  . RBC 04/23/2015 4.00* 4.40 - 5.90 MIL/uL Final  . Hemoglobin 04/23/2015  11.8* 13.0 - 18.0 g/dL Final  . HCT 04/23/2015 35.7* 40.0 - 52.0 % Final  . MCV 04/23/2015 89.3  80.0 - 100.0 fL Final  . MCH 04/23/2015 29.5  26.0 - 34.0 pg Final  . MCHC 04/23/2015 33.0  32.0 - 36.0 g/dL Final  . RDW 04/23/2015 15.2* 11.5 - 14.5 % Final  . Platelets 04/23/2015 145* 150 - 440 K/uL Final  . Neutrophils Relative % 04/23/2015 71   Final  . Neutro Abs 04/23/2015 4.8  1.4 - 6.5 K/uL Final  . Lymphocytes Relative 04/23/2015 16   Final  . Lymphs Abs 04/23/2015 1.1  1.0 - 3.6 K/uL Final  . Monocytes Relative 04/23/2015 9   Final  . Monocytes Absolute 04/23/2015 0.6  0.2 - 1.0 K/uL Final  . Eosinophils Relative 04/23/2015 3   Final  . Eosinophils Absolute 04/23/2015 0.2  0 - 0.7 K/uL Final  . Basophils Relative 04/23/2015 1   Final  . Basophils Absolute 04/23/2015 0.1  0 - 0.1 K/uL Final  . Sodium 04/23/2015 138  135 - 145 mmol/L Final  . Potassium 04/23/2015 3.6  3.5 - 5.1 mmol/L Final  . Chloride 04/23/2015 103  101 - 111 mmol/L Final  . CO2 04/23/2015 28  22 - 32 mmol/L Final  . Glucose, Bld 04/23/2015 118* 65 - 99 mg/dL Final  . BUN 04/23/2015 16  6 - 20 mg/dL Final  . Creatinine, Ser 04/23/2015 0.82  0.61 - 1.24 mg/dL Final  . Calcium 04/23/2015 8.3* 8.9 - 10.3 mg/dL Final  . Total Protein 04/23/2015 6.6  6.5 - 8.1 g/dL Final  . Albumin 04/23/2015 3.3* 3.5 - 5.0 g/dL Final  . AST 04/23/2015 17  15 - 41 U/L Final  . ALT 04/23/2015 15* 17 - 63 U/L Final  . Alkaline Phosphatase 04/23/2015 70  38 - 126 U/L Final  . Total Bilirubin 04/23/2015 0.2* 0.3 - 1.2 mg/dL Final  . GFR calc non Af Amer 04/23/2015 >60  >60 mL/min Final  . GFR calc Af Amer 04/23/2015 >60  >60 mL/min Final   Comment: (NOTE) The eGFR has been calculated using the CKD EPI equation. This calculation  has not been validated in all clinical situations. eGFR's persistently <60 mL/min signify possible Chronic Kidney Disease.   . Anion gap 04/23/2015 7  5 - 15 Final      STUDIES: Guidance  study., TP3,BRAF,JAK3,BRCA1 mutated in the blood testing (August, 2016) ASSESSMENT: Recurrent or another primary with left upper lobe invading vertebral body. Patient is tolerating treatment very well.  Had one or 2 a piece of diarrhea which resolved very quickly with Imodium.  Rash has improved. Continue anti-PDL one drug After1  more cycles patient would undergo PET scanning for restaging   ContinueTECENTRIQ Repeat PET scan after 3 cycles of chemotherapy     Carcinoma, lung   Staging form: Lung, AJCC 7th Edition    Clinical: Stage IIIA (T4, N0, M0) - Signed by Forest Gleason, MD on 10/29/2014   Forest Gleason, MD   04/24/2015 5:04 PM

## 2015-05-14 ENCOUNTER — Encounter: Payer: Self-pay | Admitting: Oncology

## 2015-05-14 ENCOUNTER — Inpatient Hospital Stay: Payer: Medicare HMO

## 2015-05-14 ENCOUNTER — Inpatient Hospital Stay (HOSPITAL_BASED_OUTPATIENT_CLINIC_OR_DEPARTMENT_OTHER): Payer: Medicare HMO | Admitting: Oncology

## 2015-05-14 VITALS — HR 98 | Temp 97.3°F | Resp 18 | Wt 147.3 lb

## 2015-05-14 VITALS — BP 164/70 | HR 74 | Resp 18

## 2015-05-14 DIAGNOSIS — C3492 Malignant neoplasm of unspecified part of left bronchus or lung: Secondary | ICD-10-CM

## 2015-05-14 DIAGNOSIS — E876 Hypokalemia: Secondary | ICD-10-CM

## 2015-05-14 DIAGNOSIS — C3412 Malignant neoplasm of upper lobe, left bronchus or lung: Secondary | ICD-10-CM | POA: Diagnosis not present

## 2015-05-14 DIAGNOSIS — Z9221 Personal history of antineoplastic chemotherapy: Secondary | ICD-10-CM | POA: Diagnosis not present

## 2015-05-14 DIAGNOSIS — C3490 Malignant neoplasm of unspecified part of unspecified bronchus or lung: Secondary | ICD-10-CM

## 2015-05-14 DIAGNOSIS — C3400 Malignant neoplasm of unspecified main bronchus: Secondary | ICD-10-CM

## 2015-05-14 DIAGNOSIS — Z923 Personal history of irradiation: Secondary | ICD-10-CM

## 2015-05-14 DIAGNOSIS — Z79899 Other long term (current) drug therapy: Secondary | ICD-10-CM

## 2015-05-14 LAB — COMPREHENSIVE METABOLIC PANEL
ALBUMIN: 3.1 g/dL — AB (ref 3.5–5.0)
ALT: 10 U/L — ABNORMAL LOW (ref 17–63)
ANION GAP: 6 (ref 5–15)
AST: 14 U/L — AB (ref 15–41)
Alkaline Phosphatase: 70 U/L (ref 38–126)
BUN: 12 mg/dL (ref 6–20)
CHLORIDE: 102 mmol/L (ref 101–111)
CO2: 29 mmol/L (ref 22–32)
Calcium: 7.8 mg/dL — ABNORMAL LOW (ref 8.9–10.3)
Creatinine, Ser: 0.71 mg/dL (ref 0.61–1.24)
GFR calc Af Amer: >60 mL/min (ref >60)
GLUCOSE: 105 mg/dL — AB (ref 65–99)
POTASSIUM: 2.7 mmol/L — AB (ref 3.5–5.1)
Sodium: 137 mmol/L (ref 135–145)
Total Bilirubin: 0.3 mg/dL (ref 0.3–1.2)
Total Protein: 6.6 g/dL (ref 6.5–8.1)

## 2015-05-14 LAB — CBC WITH DIFFERENTIAL/PLATELET
BASOS ABS: 0.1 10*3/uL (ref 0–0.1)
BASOS PCT: 1 %
EOS PCT: 3 %
Eosinophils Absolute: 0.2 10*3/uL (ref 0–0.7)
HCT: 33.7 % — ABNORMAL LOW (ref 40.0–52.0)
Hemoglobin: 11.4 g/dL — ABNORMAL LOW (ref 13.0–18.0)
Lymphocytes Relative: 22 %
Lymphs Abs: 1.1 10*3/uL (ref 1.0–3.6)
MCH: 30.1 pg (ref 26.0–34.0)
MCHC: 33.9 g/dL (ref 32.0–36.0)
MCV: 88.8 fL (ref 80.0–100.0)
MONO ABS: 0.6 10*3/uL (ref 0.2–1.0)
Monocytes Relative: 12 %
Neutro Abs: 3 10*3/uL (ref 1.4–6.5)
Neutrophils Relative %: 62 %
PLATELETS: 145 10*3/uL — AB (ref 150–440)
RBC: 3.8 MIL/uL — ABNORMAL LOW (ref 4.40–5.90)
RDW: 15.7 % — AB (ref 11.5–14.5)
WBC: 4.9 10*3/uL (ref 3.8–10.6)

## 2015-05-14 MED ORDER — POTASSIUM CHLORIDE CRYS ER 20 MEQ PO TBCR: 20.0000 meq | EXTENDED_RELEASE_TABLET | Freq: Every day | ORAL | Status: DC

## 2015-05-14 MED ORDER — SODIUM CHLORIDE 0.9 % IV SOLN
Freq: Once | INTRAVENOUS | Status: AC
  Administered 2015-05-14: 14:00:00 via INTRAVENOUS
  Filled 2015-05-14: qty 1000

## 2015-05-14 MED ORDER — ATEZOLIZUMAB CHEMO INJECTION 1200 MG/20ML
1200.0000 mg | Freq: Once | INTRAVENOUS | Status: AC
  Administered 2015-05-14: 1200 mg via INTRAVENOUS
  Filled 2015-05-14: qty 20

## 2015-05-14 MED ORDER — SODIUM CHLORIDE 0.9 % IV SOLN
Freq: Once | INTRAVENOUS | Status: AC
  Administered 2015-05-14: 14:00:00 via INTRAVENOUS
  Filled 2015-05-14: qty 250

## 2015-05-14 NOTE — Progress Notes (Signed)
Patient states he has fallen 2 times since last visit.  He also saw PCP 04-29-15.  Was started on levaquin for cough/congestion.  States SOB has increased.

## 2015-05-14 NOTE — Progress Notes (Signed)
[place] @ [facility] Telephone:(336) [phone]  Fax:(336) [place]: [birth year]  MR#: [id number]  ZJQ#:[id number]  Patient Care Team: Albina Billet, MD as PCP - General (Internal Medicine)  CHIEF COMPLAINT:  Chief Complaint  Patient presents with  . Lung Cancer    Oncology History   1.  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 Further staging workup with PET scan pending (diagnosis based on  CT scan ) July, 2016 2. SB RT for a T1 lesion of the left lung superior segment left upper lobe Biopsy was consistent with non-small cell carcinoma of lung in February of 2015 3.  Started radiation and chemotherapy for left upper lobe recurrent disease versus second primary from November 10, 2014   4.  Patient has finished total 6 weekly cycle of carboplatinum and Taxol on December 15, 2014 Will   Be finished   With radiation therapy by December 21, 2014 5.  Persistent disease and persistent pain.  Patient has been started on TECENTRIQ from March 05, 2015     Oncology Flowsheet 12/08/2014 12/15/2014 12/17/2014 01/12/2015 03/05/2015 03/26/2015 04/23/2015  Day, Cycle Day 1, 5 Day 1, 5 - - - - -  atezolizumab (TECENTRIQ) IV - - - - 1,200 mg 1,200 mg 1,200 mg  CARBOplatin (PARAPLATIN) IV 160 mg 160 mg - - - - -  dexamethasone (DECADRON) IV 20 mg - 10 mg - - - -  enoxaparin (LOVENOX) [place] - - - - - - -  LORazepam (ATIVAN) IV - - - 0.5 mg - - -  LORazepam (ATIVAN) PO - - - - - - -  ondansetron (ZOFRAN) IV - [ 8 mg ] - - - - -  PACLitaxel (TAXOL) IV 45 mg/m2 45 mg/m2 - - - - -  prochlorperazine (COMPAZINE) PO 10 mg - - - - - -    INTERVAL HISTORY: 82 year old gentleman was started on chemotherapy with platinum and Taxol starting from July of 2016.  Patient is here for ongoing evaluation and treatment consideration.  Appetite is improved.  Chest pain is improved.  No nausea.  No vomiting.  No diarrhea.  Had a repeat PET scan done which has been independently reviewed  and reviewed with the patient.. Patient is here for ongoing evaluation and treatment consideration.  Will proceed with chemotherapy patient and number of questions which will be answered.  No nausea no vomiting.  Discomfort in the left upper chest wall area persist  Patient is here to initiate third cycle of chemotherapy with TECENRIQ.  No chills.  No fever.  Rash has improved.  No nausea.  No vomiting.  No diarrhea.  Appetite has been stable. chest has improved.  No cough or shortness of breath Patient is here to initiate third cycle of chemotherapy.  Tolerating treatment very well without any significant chest pain.  Mild shortness of breath recently had an episode of bronchitis has finished Levaquin. Chest pain is improved. No diarrhea.  No rash.   PERFORMANCE STATUS (ECOG):01 HEENT:  No visual changes, runny nose, sore throat, mouth sores or tenderness. Lungs: Dry hacking cough.  No hemoptysis. Cardiac:  No chest pain, palpitations, orthopnea, or PND. GI:  No nausea, vomiting, diarrhea, constipation, melena or hematochezia. GU:  No urgency, frequency, dysuria, or hematuria. Musculoskeletal:  Left shoulder pain Extremities:  No pain or swelling. Skin: Rash in the left upper extremity left lower extremity Neuro:  No headache, numbness or weakness, balance or  coordination issues. Endocrine:  No diabetes, thyroid issues, hot flashes or night sweats. Psych:  No mood changes, depression or anxiety. Pain:  No focal pain. Review of systems:  All other systems reviewed and found to be negative. As per HPI. Otherwise, a complete review of systems is negatve.  PAST MEDICAL HISTORY: Past Medical History  Diagnosis Date  . Major depressive disorder, recurrent, severe with psychotic features ([place])   . Persistent disorder of initiating or maintaining sleep   . Generalized anxiety disorder   . Major depressive disorder, recurrent, severe with psychotic features ([place])   . Persistent disorder of  initiating or maintaining sleep   . Asthma   . Cancer ([place])     Left lung  . COPD (chronic obstructive pulmonary disease) ([place])     PAST SURGICAL HISTORY: Past Surgical History  Procedure Laterality Date  . Hemorroidectomy    . Lung surgery      FAMILY HISTORY Family History  Problem Relation Age of Onset  . COPD Mother   . Lung cancer Father   . Kidney disease Neg Hx   . Prostate cancer Neg Hx     ADVANCED DIRECTIVES:  Patient does have advance healthcare directive, Patient   does not desire to make any changes HEALTH MAINTENANCE: Social History  Substance Use Topics  . Smoking status: Former Research scientist (life sciences)  . Smokeless tobacco: Never Used     Comment: quit 18 years ago  . Alcohol Use: No      Allergies  Allergen Reactions  . Penicillins     Current Outpatient Prescriptions  Medication Sig Dispense Refill  . albuterol (PROVENTIL HFA;VENTOLIN HFA) 108 (90 BASE) MCG/ACT inhaler Inhale 1-2 puffs into the lungs QID.     Marland Kitchen atorvastatin (LIPITOR) 40 MG tablet Take 1 tablet by mouth daily.    Marland Kitchen econazole nitrate 1 % cream Apply 1 application topically 2 (two) times daily.     Marland Kitchen esomeprazole (NEXIUM) 40 MG capsule Take 40 mg by mouth daily at 12 noon.    . finasteride (PROSCAR) 5 MG tablet Take 1 tablet (5 mg total) by mouth daily. 30 tablet 12  . HYDROcodone-acetaminophen (NORCO/VICODIN) 5-325 MG per tablet Take 1 tablet by mouth every 4 (four) hours as needed for moderate pain. 30 tablet 0  . LORazepam (ATIVAN) 0.5 MG tablet Take 1 tablet (0.5 mg total) by mouth every 8 (eight) hours. 270 tablet 1  . LORazepam (ATIVAN) 1 MG tablet One tablet at bedtime and one tabelt in the morning as needed. 180 tablet 1  . magnesium citrate SOLN Take 296 mLs (1 Bottle total) by mouth once. 195 mL 2  . mometasone-formoterol (DULERA) 200-5 MCG/ACT AERO Inhale 2 puffs into the lungs 2 (two) times daily.     Marland Kitchen nystatin cream (MYCOSTATIN) Apply 1 application topically 2 (two) times daily. 30 g 0    . Polyethylene Glycol 3350 GRAN     . QUEtiapine (SEROQUEL XR) 300 MG 24 hr tablet Take 1 tablet (300 mg total) by mouth at bedtime. 90 tablet 1  . tamsulosin (FLOMAX) 0.4 MG CAPS capsule Take 1 capsule by mouth daily.    Marland Kitchen tiotropium (SPIRIVA) 18 MCG inhalation capsule Place into inhaler and inhale daily.    Marland Kitchen triamcinolone cream (KENALOG) 0.1 %     . venlafaxine XR (EFFEXOR-XR) 150 MG 24 hr capsule Take 1 capsule (150 mg total) by mouth daily. 90 capsule 1  . zolpidem (AMBIEN) 10 MG tablet Take 1 tablet (10 mg total)  by mouth at bedtime. 90 tablet 1  . potassium chloride SA (K-DUR,KLOR-CON) 20 MEQ tablet Take 1 tablet (20 mEq total) by mouth daily. 30 tablet 3   No current facility-administered medications for this visit.   Facility-Administered Medications Ordered in Other Visits  Medication Dose Route Frequency Provider Last Rate Last Dose  . 0.9 %  sodium chloride infusion   Intravenous Once Forest Gleason, MD      . [facility]) 1,200 mg in sodium chloride 0.9 % 250 mL chemo infusion  1,200 mg Intravenous Once Forest Gleason, MD      . sodium chloride 0.9 % 250 mL with potassium chloride 20 mEq infusion   Intravenous Once Forest Gleason, MD        OBJECTIVE:  Filed Vitals:   05/14/15 1346  Pulse: 98  Temp: 97.3 F (36.3 C)  Resp: 18     Body mass index is 23.06 kg/(m^2).    ECOG FS:1 - Symptomatic but completely ambulatory  PHYSICAL EXAM: GENERAL:  Well developed, well nourished, sitting comfortably in the exam room in no acute distress. MENTAL STATUS:  Alert and oriented to person, place and time. HEAD: Alopecia  Normocephalic, atraumatic, face symmetric, no Cushingoid features. EYES:  Pupils equal round and reactive to light and accomodation.  No conjunctivitis or scleral icterus. ENT:  Oropharynx clear without lesion.  Tongue normal. Mucous membranes moist.  RESPIRATORY:  Clear to auscultation without rales, wheezes or rhonchi. CARDIOVASCULAR:  Regular rate and  rhythm without murmur, rub or gallop. BREAST:  Right breast without masses, skin changes or nipple discharge.  Left breast without masses, skin changes or nipple discharge. ABDOMEN:  Soft, non-tender, with active bowel sounds, and no hepatosplenomegaly.  No masses. BACK:  No CVA tenderness.  No tenderness on percussion of the back or rib cage. SKIN: Maculopapular rash. EXTREMITIES: No edema, no skin discoloration or tenderness.  No palpable cords. LYMPH NODES: No palpable cervical, supraclavicular, axillary or inguinal adenopathy  NEUROLOGICAL: Unremarkable. PSYCH:  Appropriate.   LAB RESULTS:  Appointment on 05/14/2015  Component Date Value Ref Range Status  . WBC 05/14/2015 4.9  3.8 - 10.6 K/uL Final  . RBC 05/14/2015 3.80* 4.40 - 5.90 MIL/uL Final  . Hemoglobin 05/14/2015 11.4* 13.0 - 18.0 g/dL Final  . HCT 05/14/2015 33.7* 40.0 - 52.0 % Final  . MCV 05/14/2015 88.8  80.0 - 100.0 fL Final  . MCH 05/14/2015 30.1  26.0 - 34.0 pg Final  . MCHC 05/14/2015 33.9  32.0 - 36.0 g/dL Final  . RDW 05/14/2015 15.7* 11.5 - 14.5 % Final  . Platelets 05/14/2015 145* 150 - 440 K/uL Final  . Neutrophils Relative % 05/14/2015 62   Final  . Neutro Abs 05/14/2015 3.0  1.4 - 6.5 K/uL Final  . Lymphocytes Relative 05/14/2015 22   Final  . Lymphs Abs 05/14/2015 1.1  1.0 - 3.6 K/uL Final  . Monocytes Relative 05/14/2015 12   Final  . Monocytes Absolute 05/14/2015 0.6  0.2 - 1.0 K/uL Final  . Eosinophils Relative 05/14/2015 3   Final  . Eosinophils Absolute 05/14/2015 0.2  0 - 0.7 K/uL Final  . Basophils Relative 05/14/2015 1   Final  . Basophils Absolute 05/14/2015 0.1  0 - 0.1 K/uL Final  . Sodium 05/14/2015 137  135 - 145 mmol/L Final  . Potassium 05/14/2015 2.7* 3.5 - 5.1 mmol/L Final   Comment: RESULTS VERIFIED BY REPEAT TESTING CRITICAL RESULT CALLED TO, READ BACK BY AND VERIFIED WITH HAYLEY RHODE AT  1320 05/14/2015 KMR   . Chloride 05/14/2015 102  101 - 111 mmol/L Final  . CO2 05/14/2015 29   22 - 32 mmol/L Final  . Glucose, Bld 05/14/2015 105* 65 - 99 mg/dL Final  . BUN 05/14/2015 12  6 - 20 mg/dL Final  . Creatinine, Ser 05/14/2015 0.71  0.61 - 1.24 mg/dL Final  . Calcium 05/14/2015 7.8* 8.9 - 10.3 mg/dL Final  . Total Protein 05/14/2015 6.6  6.5 - 8.1 g/dL Final  . Albumin 05/14/2015 3.1* 3.5 - 5.0 g/dL Final  . AST 05/14/2015 14* 15 - 41 U/L Final  . ALT 05/14/2015 10* 17 - 63 U/L Final  . Alkaline Phosphatase 05/14/2015 70  38 - 126 U/L Final  . Total Bilirubin 05/14/2015 0.3  0.3 - 1.2 mg/dL Final  . GFR calc non Af Amer 05/14/2015 >60  >60 mL/min Final  . GFR calc Af Amer 05/14/2015 >60  >60 mL/min Final   Comment: (NOTE) The eGFR has been calculated using the CKD EPI equation. This calculation has not been validated in all clinical situations. eGFR's persistently <60 mL/min signify possible Chronic Kidney Disease.   . Anion gap 05/14/2015 6  5 - 15 Final      STUDIES: Guidance study., TP3,BRAF,JAK3,BRCA1 mutated in the blood testing (August, 2016) ASSESSMENT: Recurrent or another primary with left upper lobe invading vertebral body. Patient is tolerating treatment very well.  Had one or 2 a piece of diarrhea which resolved very quickly with Imodium.  Rash has improved. Continue anti-PDL one drug Prior to next cycle of treatment will repeat PET scan for restaging. 2, hypokalemia Potassium is 2.7 Will give intravenous 20 mEq of potassium And will start patient on 20 mEq of potassium by mouth      Carcinoma, lung   Staging form: Lung, AJCC 7th Edition    Clinical: Stage IIIA (T4, N0, M0) - Signed by Forest Gleason, MD on 10/29/2014   Forest Gleason, MD   05/14/2015 2:16 PM

## 2015-05-19 ENCOUNTER — Encounter: Payer: Self-pay | Admitting: Psychiatry

## 2015-05-19 ENCOUNTER — Ambulatory Visit (INDEPENDENT_AMBULATORY_CARE_PROVIDER_SITE_OTHER): Payer: Medicare HMO | Admitting: Psychiatry

## 2015-05-19 VITALS — BP 124/68 | HR 97 | Temp 97.3°F | Ht 67.0 in | Wt 148.0 lb

## 2015-05-19 DIAGNOSIS — F333 Major depressive disorder, recurrent, severe with psychotic symptoms: Secondary | ICD-10-CM | POA: Diagnosis not present

## 2015-05-19 DIAGNOSIS — G4700 Insomnia, unspecified: Secondary | ICD-10-CM

## 2015-05-19 DIAGNOSIS — F411 Generalized anxiety disorder: Secondary | ICD-10-CM

## 2015-05-19 NOTE — Progress Notes (Signed)
BH MD/PA/NP OP Progress Note  05/19/2015 9:02 AM Timothy Ali  MRN:  [id number]  Subjective:  Patient presents for follow-up of his generalized anxiety disorder, insomnia and major depressive disorder. He indicates has not been much change in his situation. He states he continues to go to his chemotherapy every 3 weeks. He states I have another treatment in about 3 weeks. He states at this time the doctors plans to look at a PET scan and compared to a previous one to decide further course of treatment.  Guards to social contacts he states he occasionally goes to a biscuit shop in the morning and meets some people there for breakfast although he states the conversation isn't that extensive. He stays in his apartment where he has in the past made accusations that people are doing things to him. However today we explored this and he states the only thing people might do is to try to use him as an example. He states that her next her neighbor is the same age and the neighbor's son took the neighbor's driver's license from him. He indicated that the neighbor tries to use the patient has an example stating that if this patient has his try per's license so should he. When pressed however he states no one is really doing anything to him.  He continues take his medications. He states his sleep is fair. However own we explored it he states he goes to bed at 5 PM and wakes up at 1 PM. I indicated this probably shifted his sleep cycle. He states that he's been doing this since he had to take care of his late wife when she cancer and thus he had to coordinate his schedule with hers.  Asian was very concerned about his lorazepam being taken away. He wanted Probation officer to make sure that the next Dr. knew he had been on this medication for years. Writer explained that I would convey that in this note however I cannot make any assurances or guarantees about another doctor's decisions.  Given his significant stressor of  cancer and current treatment his are ready baseline anxiety is likely high and thus were given to continue home on his lorazepam without any changes. In addition he is not showing any signs of confusion or mobility issues secondary to the lorazepam.  Chief Complaint: sleep Chief Complaint    Follow-up; Medication Refill     Visit Diagnosis:     ICD-9-CM ICD-10-CM   1. GAD (generalized anxiety disorder) 300.02 F41.1   2. Severe recurrent major depressive disorder with psychotic features ([place]) 296.34 F33.3   3. Insomnia 780.52 G47.00     Past Medical History:  Past Medical History  Diagnosis Date  . Major depressive disorder, recurrent, severe with psychotic features ([place])   . Persistent disorder of initiating or maintaining sleep   . Generalized anxiety disorder   . Major depressive disorder, recurrent, severe with psychotic features ([place])   . Persistent disorder of initiating or maintaining sleep   . Asthma   . Cancer ([place])     Left lung  . COPD (chronic obstructive pulmonary disease) [facility])     Past Surgical History  Procedure Laterality Date  . Hemorroidectomy    . Lung surgery     Family History:  Family History  Problem Relation Age of Onset  . COPD Mother   . Lung cancer Father   . Kidney disease Neg Hx   . Prostate cancer Neg Hx    Social  History:  Social History   Social History  . Marital Status: Widowed    Spouse Name: N/A  . Number of Children: N/A  . Years of Education: N/A   Social History Main Topics  . Smoking status: Former Research scientist (life sciences)  . Smokeless tobacco: Never Used     Comment: quit 18 years ago  . Alcohol Use: No  . Drug Use: No  . Sexual Activity: No   Other Topics Concern  . None   Social History Narrative   Additional History:   Assessment:   Musculoskeletal: Strength & Muscle Tone: within normal limits Gait & Station: normal Patient leans: N/A  Psychiatric Specialty Exam: Anxiety Symptoms include nervous/anxious behavior.  Patient reports no insomnia (doing well with Ambien) or suicidal ideas.    Depression        Associated symptoms include does not have insomnia (doing well with Ambien) and no suicidal ideas.  Past medical history includes anxiety.     Review of Systems  Psychiatric/Behavioral: Negative for depression, suicidal ideas, hallucinations, memory loss and substance abuse. The patient is nervous/anxious. The patient does not have insomnia (doing well with Ambien).   All other systems reviewed and are negative.   Blood pressure 124/68, pulse 97, temperature 97.3 F (36.3 C), temperature source Tympanic, height '5\' 7"'$  (1.702 m), weight 148 lb (67.132 kg), SpO2 79 %.Body mass index is 23.17 kg/(m^2).  General Appearance: Neat and Well Groomed  Eye Contact:  Good  Speech:  Clear and Coherent and Normal Rate  Volume:  Normal  Mood:  good  Affect:  Congruent  Thought Process:  Linear and Logical  Orientation:  Full (Time, Place, and Person)  Thought Content:  Not verbalizing paranoid ideation as in past visit  Suicidal Thoughts:  No  Homicidal Thoughts:  No  Memory:  Immediate;   Good Recent;   Good Remote;   Good  Judgement:  Good  Insight:  Fair  Psychomotor Activity:  Negative  Concentration:  Good  Recall:  Good  Fund of Knowledge: Good  Language: Good  Akathisia:  Negative  Handed:  Right unknown  AIMS (if indicated):  Not done  Assets:  Desire for Improvement  ADL's:  Intact  Cognition: WNL  Sleep:  5 hour per night   Is the patient at risk to self?  No. Has the patient been a risk to self in the past 6 months?  No. Has the patient been a risk to self within the distant past?  No. Is the patient a risk to others?  No. Has the patient been a risk to others in the past 6 months?  No. Has the patient been a risk to others within the distant past?  No.  Current Medications: Current Outpatient Prescriptions  Medication Sig Dispense Refill  . albuterol (PROVENTIL HFA;VENTOLIN  HFA) 108 (90 BASE) MCG/ACT inhaler Inhale 1-2 puffs into the lungs QID.     Marland Kitchen atorvastatin (LIPITOR) 40 MG tablet Take 1 tablet by mouth daily.    Marland Kitchen econazole nitrate 1 % cream Apply 1 application topically 2 (two) times daily.     Marland Kitchen esomeprazole (NEXIUM) 40 MG capsule Take 40 mg by mouth daily at 12 noon.    . finasteride (PROSCAR) 5 MG tablet Take 1 tablet (5 mg total) by mouth daily. 30 tablet 12  . HYDROcodone-acetaminophen (NORCO/VICODIN) 5-325 MG per tablet Take 1 tablet by mouth every 4 (four) hours as needed for moderate pain. 30 tablet 0  . LORazepam (ATIVAN) 0.5  MG tablet Take 1 tablet (0.5 mg total) by mouth every 8 (eight) hours. 270 tablet 1  . LORazepam (ATIVAN) 1 MG tablet One tablet at bedtime and one tabelt in the morning as needed. 180 tablet 1  . magnesium citrate SOLN Take 296 mLs (1 Bottle total) by mouth once. 195 mL 2  . mometasone-formoterol (DULERA) 200-5 MCG/ACT AERO Inhale 2 puffs into the lungs 2 (two) times daily.     Marland Kitchen nystatin cream (MYCOSTATIN) Apply 1 application topically 2 (two) times daily. 30 g 0  . Polyethylene Glycol 3350 GRAN     . potassium chloride SA (K-DUR,KLOR-CON) 20 MEQ tablet Take 1 tablet (20 mEq total) by mouth daily. 30 tablet 3  . QUEtiapine (SEROQUEL XR) 300 MG 24 hr tablet Take 1 tablet (300 mg total) by mouth at bedtime. 90 tablet 1  . tamsulosin (FLOMAX) 0.4 MG CAPS capsule Take 1 capsule by mouth daily.    Marland Kitchen tiotropium (SPIRIVA) 18 MCG inhalation capsule Place into inhaler and inhale daily.    Marland Kitchen triamcinolone cream (KENALOG) 0.1 %     . venlafaxine XR (EFFEXOR-XR) 150 MG 24 hr capsule Take 1 capsule (150 mg total) by mouth daily. 90 capsule 1  . zolpidem (AMBIEN) 10 MG tablet Take 1 tablet (10 mg total) by mouth at bedtime. 90 tablet 1   No current facility-administered medications for this visit.    Medical Decision Making:  Established Problem, Stable/Improving (1), Review of Medication Regimen & Side Effects (2) and Review of New  Medication or Change in Dosage (2)  Treatment Plan Summary:Medication management and Plan    Continue lorazepam. He is now taking a half a milligram 3 times a day and 1 mg at bedtime and another milligram in the early morning if he wakes. We'll continue him on his Effexor XR at 150 mg daily, we'll continue the Seroquel XR 300 mg at bedtime. Continue his Ambien 10 mg at bedtime as needed.    Major depressive disorder, recurrent severe with psychotic features-stable on Effexor and Seroquel.  Generalized anxiety disorder-we'll continue use the Effexor and keep the lorazepam as above.   Patient was written for 90 day supply of all of his medicines on 04/07/2015 with 1 refill for another 90 day supply.  He is already aware of my departure from the clinic and that there be another provider in the clinic to continue his care.   Faith Rogue 05/19/2015, 9:02 AM

## 2015-06-01 ENCOUNTER — Encounter
Admission: RE | Admit: 2015-06-01 | Discharge: 2015-06-01 | Disposition: A | Discharge status: Home / Self Care | Payer: Medicare HMO | Source: Ambulatory Visit | Attending: Oncology | Admitting: Oncology

## 2015-06-01 DIAGNOSIS — C3400 Malignant neoplasm of unspecified main bronchus: Secondary | ICD-10-CM | POA: Insufficient documentation

## 2015-06-01 LAB — GLUCOSE, CAPILLARY: Glucose-Capillary: 73 mg/dL (ref 65–99)

## 2015-06-01 MED ORDER — FLUDEOXYGLUCOSE F - 18 (FDG) INJECTION
12.4300 | Freq: Once | INTRAVENOUS | Status: AC | PRN
  Administered 2015-06-01: 12.43 via INTRAVENOUS

## 2015-06-04 ENCOUNTER — Encounter: Payer: Self-pay | Admitting: Oncology

## 2015-06-04 ENCOUNTER — Encounter: Payer: Self-pay | Admitting: Radiation Oncology

## 2015-06-04 ENCOUNTER — Inpatient Hospital Stay: Payer: Medicare HMO | Attending: Oncology

## 2015-06-04 ENCOUNTER — Inpatient Hospital Stay (HOSPITAL_BASED_OUTPATIENT_CLINIC_OR_DEPARTMENT_OTHER): Payer: Medicare HMO | Admitting: Oncology

## 2015-06-04 ENCOUNTER — Inpatient Hospital Stay: Payer: Medicare HMO

## 2015-06-04 ENCOUNTER — Ambulatory Visit
Admission: RE | Admit: 2015-06-04 | Discharge: 2015-06-04 | Disposition: A | Discharge status: Home / Self Care | Payer: Medicare HMO | Source: Ambulatory Visit | Attending: Radiation Oncology | Admitting: Radiation Oncology

## 2015-06-04 VITALS — BP 110/66 | HR 89 | Temp 94.6°F | Resp 18 | Wt 143.5 lb

## 2015-06-04 VITALS — BP 97/57 | HR 99 | Temp 96.2°F | Resp 18 | Wt 143.3 lb

## 2015-06-04 DIAGNOSIS — R079 Chest pain, unspecified: Secondary | ICD-10-CM | POA: Insufficient documentation

## 2015-06-04 DIAGNOSIS — C3400 Malignant neoplasm of unspecified main bronchus: Secondary | ICD-10-CM

## 2015-06-04 DIAGNOSIS — C3412 Malignant neoplasm of upper lobe, left bronchus or lung: Secondary | ICD-10-CM | POA: Diagnosis not present

## 2015-06-04 DIAGNOSIS — Z79899 Other long term (current) drug therapy: Secondary | ICD-10-CM | POA: Diagnosis not present

## 2015-06-04 DIAGNOSIS — Z923 Personal history of irradiation: Secondary | ICD-10-CM

## 2015-06-04 DIAGNOSIS — Z87891 Personal history of nicotine dependence: Secondary | ICD-10-CM

## 2015-06-04 DIAGNOSIS — C3490 Malignant neoplasm of unspecified part of unspecified bronchus or lung: Secondary | ICD-10-CM

## 2015-06-04 DIAGNOSIS — Z5111 Encounter for antineoplastic chemotherapy: Secondary | ICD-10-CM | POA: Insufficient documentation

## 2015-06-04 DIAGNOSIS — J449 Chronic obstructive pulmonary disease, unspecified: Secondary | ICD-10-CM | POA: Diagnosis not present

## 2015-06-04 DIAGNOSIS — C3492 Malignant neoplasm of unspecified part of left bronchus or lung: Secondary | ICD-10-CM

## 2015-06-04 DIAGNOSIS — F339 Major depressive disorder, recurrent, unspecified: Secondary | ICD-10-CM | POA: Diagnosis not present

## 2015-06-04 DIAGNOSIS — F411 Generalized anxiety disorder: Secondary | ICD-10-CM | POA: Diagnosis not present

## 2015-06-04 DIAGNOSIS — J45909 Unspecified asthma, uncomplicated: Secondary | ICD-10-CM | POA: Diagnosis not present

## 2015-06-04 LAB — CBC WITH DIFFERENTIAL/PLATELET
Basophils Absolute: 0 10*3/uL (ref 0–0.1)
Basophils Relative: 1 %
EOS PCT: 3 %
Eosinophils Absolute: 0.2 10*3/uL (ref 0–0.7)
HCT: 34.4 % — ABNORMAL LOW (ref 40.0–52.0)
Hemoglobin: 11.7 g/dL — ABNORMAL LOW (ref 13.0–18.0)
LYMPHS ABS: 0.8 10*3/uL — AB (ref 1.0–3.6)
LYMPHS PCT: 15 %
MCH: 30.6 pg (ref 26.0–34.0)
MCHC: 34.1 g/dL (ref 32.0–36.0)
MCV: 89.8 fL (ref 80.0–100.0)
MONOS PCT: 10 %
Monocytes Absolute: 0.6 10*3/uL (ref 0.2–1.0)
Neutro Abs: 4.1 10*3/uL (ref 1.4–6.5)
Neutrophils Relative %: 71 %
PLATELETS: 157 10*3/uL (ref 150–440)
RBC: 3.82 MIL/uL — AB (ref 4.40–5.90)
RDW: 16.2 % — ABNORMAL HIGH (ref 11.5–14.5)
WBC: 5.8 10*3/uL (ref 3.8–10.6)

## 2015-06-04 LAB — COMPREHENSIVE METABOLIC PANEL
ALT: 12 U/L — AB (ref 17–63)
AST: 16 U/L (ref 15–41)
Albumin: 3.5 g/dL (ref 3.5–5.0)
Alkaline Phosphatase: 62 U/L (ref 38–126)
Anion gap: 6 (ref 5–15)
BILIRUBIN TOTAL: 0.4 mg/dL (ref 0.3–1.2)
BUN: 17 mg/dL (ref 6–20)
CO2: 27 mmol/L (ref 22–32)
CREATININE: 0.89 mg/dL (ref 0.61–1.24)
Calcium: 8.5 mg/dL — ABNORMAL LOW (ref 8.9–10.3)
Chloride: 104 mmol/L (ref 101–111)
Glucose, Bld: 101 mg/dL — ABNORMAL HIGH (ref 65–99)
POTASSIUM: 4.2 mmol/L (ref 3.5–5.1)
Sodium: 137 mmol/L (ref 135–145)
TOTAL PROTEIN: 7.1 g/dL (ref 6.5–8.1)

## 2015-06-04 LAB — MAGNESIUM: Magnesium: 1.9 mg/dL (ref 1.7–2.4)

## 2015-06-04 MED ORDER — SODIUM CHLORIDE 0.9 % IV SOLN
Freq: Once | INTRAVENOUS | Status: AC
  Administered 2015-06-04: 10:00:00 via INTRAVENOUS
  Filled 2015-06-04: qty 1000

## 2015-06-04 MED ORDER — SODIUM CHLORIDE 0.9 % IV SOLN
1200.0000 mg | Freq: Once | INTRAVENOUS | Status: AC
  Administered 2015-06-04: 1200 mg via INTRAVENOUS
  Filled 2015-06-04: qty 20

## 2015-06-04 NOTE — Progress Notes (Signed)
Radiation Oncology Follow up Note  Name: Timothy Ali   Date:   06/04/2015 MRN:  [id number] DOB: 1929-11-09    This 82 y.o. male presents to the clinic today for follow-up of T1 lung cancer of the superior segment of left upper lobe and now out 5 months of non-small cell lung cancer abutting the spinal column status post concurrent chemoradiation.  REFERRING PROVIDER: Albina Billet, MD  HPI: Patient is in 82 year old male previously treated with SB RT to her left upper lobe T1 lesion as well as recurrent disease abutting the thoracic spinal column in the left upper lobe receive concurrent chemotherapy and radiation for that and is now 5 months out.Marland Kitchen Recent PET CT scan showed disease progression with hypermetabolic activity in the left chest wall with a slightly enlarging left lower lobe pulmonary nodule. That lesion measures 8 mm of undetermined origin. He is currently being treated with Tecenentriq  a targeted therapy which he appears to be doing well on without significant side effect. His only complaint is some lower right rib pain not sure the etiology of that nothing to correspond on PET/CT. P when take is good still needs to improve his nutritional intake no significant hemoptysis cough or marked shortness of breath.  COMPLICATIONS OF TREATMENT: none  FOLLOW UP COMPLIANCE: keeps appointments   PHYSICAL EXAM:  BP 110/66 mmHg  Pulse 89  Temp(Src) 94.6 F (34.8 C)  Resp 18  Wt 143 lb 8.3 oz (65.1 kg) Thin slightly cachectic male in NAD. No cervical or supra clavicular adenopathy is identified. Deep palpation of the spine does not elicit pain.  Well-developed well-nourished patient in NAD. HEENT reveals PERLA, EOMI, discs not visualized.  Oral cavity is clear. No oral mucosal lesions are identified. Neck is clear without evidence of cervical or supraclavicular adenopathy. Lungs are clear to A&P. Cardiac examination is essentially unremarkable with regular rate and rhythm without murmur  rub or thrill. Abdomen is benign with no organomegaly or masses noted. Motor sensory and DTR levels are equal and symmetric in the upper and lower extremities. Cranial nerves II through XII are grossly intact. Proprioception is intact. No peripheral adenopathy or edema is identified. No motor or sensory levels are noted. Crude visual fields are within normal range.  RADIOLOGY RESULTS: Recent PET CT scan is reviewed and compared to prior studies  PLAN: Present time patient continues on targeted therapy under medical oncology's direction. See no reason to add any further palliative treatment at this time from radiation standpoint. I've asked to see him back in 4 months for follow-up. He continues close follow-up care by medical oncology. Would be happy to reevaluate patient at any time should further palliative treatment be indicated.  I would like to take this opportunity for allowing me to participate in the care of your patient.Armstead Peaks., MD

## 2015-06-04 NOTE — Progress Notes (Signed)
[place] @ [facility] [phone]) [phone]  Fax:(336) [place]: October 16, 1929  MR#: [id number]  EZM#:[id number]  Patient Care Team: Albina Billet, MD as PCP - General (Internal Medicine)  CHIEF COMPLAINT:  Chief Complaint  Patient presents with  . Lung Cancer    Oncology History   1.  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 Further staging workup with PET scan pending (diagnosis based on  CT scan ) July, 2016 2. SB RT for a T1 lesion of the left lung superior segment left upper lobe Biopsy was consistent with non-small cell carcinoma of lung in February of 2015 3.  Started radiation and chemotherapy for left upper lobe recurrent disease versus second primary from November 10, 2014   4.  Patient has finished total 6 weekly cycle of carboplatinum and Taxol on December 15, 2014 Will   Be finished   With radiation therapy by December 21, 2014 5.  Persistent disease and persistent pain.  Patient has been started on TECENTRIQ from March 05, 2015     Oncology Flowsheet 12/15/2014 12/17/2014 01/12/2015 03/05/2015 03/26/2015 04/23/2015 05/14/2015  Day, Cycle Day 1, 5 - - - - - -  atezolizumab (TECENTRIQ) IV - - - 1,200 mg 1,200 mg 1,200 mg 1,200 mg  CARBOplatin (PARAPLATIN) IV 160 mg - - - - - -  dexamethasone (DECADRON) IV - 10 mg - - - - -  enoxaparin (LOVENOX) [place] - - - - - - -  LORazepam (ATIVAN) IV - - 0.5 mg - - - -  LORazepam (ATIVAN) PO - - - - - - -  ondansetron (ZOFRAN) IV [ 8 mg ] - - - - - -  PACLitaxel (TAXOL) IV 45 mg/m2 - - - - - -  prochlorperazine (COMPAZINE) PO - - - - - - -    INTERVAL HISTORY: 82 year old gentleman was started on chemotherapy with platinum and Taxol starting from July of 2016.  Patient is here for ongoing evaluation and treatment consideration.  Appetite is improved.  Chest pain is improved.  No nausea.  No vomiting.  No diarrhea.  Had a repeat PET scan done which has been independently reviewed and reviewed with the  patient.. Patient  is here for ongoing evaluation and treatment consideration.  Symptomatically patient's condition is improved.  Has lost a few pounds of weight recently. Does not have dizziness.  No diarrhea. No chills and fever.  Chest pain is improved patient had a repeat PET scan PERFORMANCE STATUS (ECOG):01 HEENT:  No visual changes, runny nose, sore throat, mouth sores or tenderness. Lungs: Dry hacking cough.  No hemoptysis. Cardiac:  No chest pain, palpitations, orthopnea, or PND. GI:  No nausea, vomiting, diarrhea, constipation, melena or hematochezia. GU:  No urgency, frequency, dysuria, or hematuria. Musculoskeletal:  Left shoulder pain Extremities:  No pain or swelling. Skin: Rash in the left upper extremity left lower extremity Neuro:  No headache, numbness or weakness, balance or coordination issues. Endocrine:  No diabetes, thyroid issues, hot flashes or night sweats. Psych:  No mood changes, depression or anxiety. Pain:  No focal pain. Review of systems:  All other systems reviewed and found to be negative. As per HPI. Otherwise, a complete review of systems is negatve.  PAST MEDICAL HISTORY: Past Medical History  Diagnosis Date  . Major depressive disorder, recurrent, severe with psychotic features ([place])   . Persistent disorder of initiating or maintaining sleep   . Generalized anxiety  disorder   . Major depressive disorder, recurrent, severe with psychotic features ([place])   . Persistent disorder of initiating or maintaining sleep   . Asthma   . Cancer ([place])     Left lung  . COPD (chronic obstructive pulmonary disease) ([place])     PAST SURGICAL HISTORY: Past Surgical History  Procedure Laterality Date  . Hemorroidectomy    . Lung surgery      FAMILY HISTORY Family History  Problem Relation Age of Onset  . COPD Mother   . Lung cancer Father   . Kidney disease Neg Hx   . Prostate cancer Neg Hx     ADVANCED DIRECTIVES:  Patient does have advance healthcare  directive, Patient   does not desire to make any changes HEALTH MAINTENANCE: Social History  Substance Use Topics  . Smoking status: Former Research scientist (life sciences)  . Smokeless tobacco: Never Used     Comment: quit 18 years ago  . Alcohol Use: No      Allergies  Allergen Reactions  . Penicillins     Current Outpatient Prescriptions  Medication Sig Dispense Refill  . albuterol (PROVENTIL HFA;VENTOLIN HFA) 108 (90 BASE) MCG/ACT inhaler Inhale 1-2 puffs into the lungs QID.     Marland Kitchen atorvastatin (LIPITOR) 40 MG tablet Take 1 tablet by mouth daily.    Marland Kitchen BREO ELLIPTA 100-25 MCG/INH AEPB     . econazole nitrate 1 % cream Apply 1 application topically 2 (two) times daily.     Marland Kitchen esomeprazole (NEXIUM) 40 MG capsule Take 40 mg by mouth daily at 12 noon.    . finasteride (PROSCAR) 5 MG tablet Take 1 tablet (5 mg total) by mouth daily. 30 tablet 12  . HYDROcodone-acetaminophen (NORCO/VICODIN) 5-325 MG per tablet Take 1 tablet by mouth every 4 (four) hours as needed for moderate pain. 30 tablet 0  . LORazepam (ATIVAN) 0.5 MG tablet Take 1 tablet (0.5 mg total) by mouth every 8 (eight) hours. 270 tablet 1  . LORazepam (ATIVAN) 1 MG tablet One tablet at bedtime and one tabelt in the morning as needed. 180 tablet 1  . magnesium citrate SOLN Take 296 mLs (1 Bottle total) by mouth once. 195 mL 2  . mometasone-formoterol (DULERA) 200-5 MCG/ACT AERO Inhale 2 puffs into the lungs 2 (two) times daily.     Marland Kitchen nystatin cream (MYCOSTATIN) Apply 1 application topically 2 (two) times daily. 30 g 0  . Polyethylene Glycol 3350 GRAN     . potassium chloride SA (K-DUR,KLOR-CON) 20 MEQ tablet Take 1 tablet (20 mEq total) by mouth daily. 30 tablet 3  . QUEtiapine (SEROQUEL XR) 300 MG 24 hr tablet Take 1 tablet (300 mg total) by mouth at bedtime. 90 tablet 1  . tamsulosin (FLOMAX) 0.4 MG CAPS capsule Take 1 capsule by mouth daily.    Marland Kitchen tiotropium (SPIRIVA) 18 MCG inhalation capsule Place into inhaler and inhale daily.    Marland Kitchen triamcinolone  cream (KENALOG) 0.1 %     . venlafaxine XR (EFFEXOR-XR) 150 MG 24 hr capsule Take 1 capsule (150 mg total) by mouth daily. 90 capsule 1  . zolpidem (AMBIEN) 10 MG tablet Take 1 tablet (10 mg total) by mouth at bedtime. 90 tablet 1   No current facility-administered medications for this visit.    OBJECTIVE:  Filed Vitals:   06/04/15 0916  BP: 97/57  Pulse: 99  Temp: 96.2 F (35.7 C)  Resp: 18     Body mass index is 22.44 kg/(m^2).    ECOG  FS:1 - Symptomatic but completely ambulatory  PHYSICAL EXAM: GENERAL:  Well developed, well nourished, sitting comfortably in the exam room in no acute distress. MENTAL STATUS:  Alert and oriented to person, place and time. HEAD: Alopecia  Normocephalic, atraumatic, face symmetric, no Cushingoid features. EYES:  Pupils equal round and reactive to light and accomodation.  No conjunctivitis or scleral icterus. ENT:  Oropharynx clear without lesion.  Tongue normal. Mucous membranes moist.  RESPIRATORY:  Clear to auscultation without rales, wheezes or rhonchi. CARDIOVASCULAR:  Regular rate and rhythm without murmur, rub or gallop. BREAST:  Right breast without masses, skin changes or nipple discharge.  Left breast without masses, skin changes or nipple discharge. ABDOMEN:  Soft, non-tender, with active bowel sounds, and no hepatosplenomegaly.  No masses. BACK:  No CVA tenderness.  No tenderness on percussion of the back or rib cage. SKIN: Maculopapular rash. EXTREMITIES: No edema, no skin discoloration or tenderness.  No palpable cords. LYMPH NODES: No palpable cervical, supraclavicular, axillary or inguinal adenopathy  NEUROLOGICAL: Unremarkable. PSYCH:  Appropriate.   LAB RESULTS:  Appointment on 06/04/2015  Component Date Value Ref Range Status  . WBC 06/04/2015 5.8  3.8 - 10.6 K/uL Final  . RBC 06/04/2015 3.82* 4.40 - 5.90 MIL/uL Final  . Hemoglobin 06/04/2015 11.7* 13.0 - 18.0 g/dL Final  . HCT 06/04/2015 34.4* 40.0 - 52.0 % Final  . MCV  06/04/2015 89.8  80.0 - 100.0 fL Final  . MCH 06/04/2015 30.6  26.0 - 34.0 pg Final  . MCHC 06/04/2015 34.1  32.0 - 36.0 g/dL Final  . RDW 06/04/2015 16.2* 11.5 - 14.5 % Final  . Platelets 06/04/2015 157  150 - 440 K/uL Final  . Neutrophils Relative % 06/04/2015 71   Final  . Neutro Abs 06/04/2015 4.1  1.4 - 6.5 K/uL Final  . Lymphocytes Relative 06/04/2015 15   Final  . Lymphs Abs 06/04/2015 0.8* 1.0 - 3.6 K/uL Final  . Monocytes Relative 06/04/2015 10   Final  . Monocytes Absolute 06/04/2015 0.6  0.2 - 1.0 K/uL Final  . Eosinophils Relative 06/04/2015 3   Final  . Eosinophils Absolute 06/04/2015 0.2  0 - 0.7 K/uL Final  . Basophils Relative 06/04/2015 1   Final  . Basophils Absolute 06/04/2015 0.0  0 - 0.1 K/uL Final  . Sodium 06/04/2015 137  135 - 145 mmol/L Final  . Potassium 06/04/2015 4.2  3.5 - 5.1 mmol/L Final  . Chloride 06/04/2015 104  101 - 111 mmol/L Final  . CO2 06/04/2015 27  22 - 32 mmol/L Final  . Glucose, Bld 06/04/2015 101* 65 - 99 mg/dL Final  . BUN 06/04/2015 17  6 - 20 mg/dL Final  . Creatinine, Ser 06/04/2015 0.89  0.61 - 1.24 mg/dL Final  . Calcium 06/04/2015 8.5* 8.9 - 10.3 mg/dL Final  . Total Protein 06/04/2015 7.1  6.5 - 8.1 g/dL Final  . Albumin 06/04/2015 3.5  3.5 - 5.0 g/dL Final  . AST 06/04/2015 16  15 - 41 U/L Final  . ALT 06/04/2015 12* 17 - 63 U/L Final  . Alkaline Phosphatase 06/04/2015 62  38 - 126 U/L Final  . Total Bilirubin 06/04/2015 0.4  0.3 - 1.2 mg/dL Final  . GFR calc non Af Amer 06/04/2015 >60  >60 mL/min Final  . GFR calc Af Amer 06/04/2015 >60  >60 mL/min Final   Comment: (NOTE) The eGFR has been calculated using the CKD EPI equation. This calculation has not been validated in all clinical situations.  eGFR's persistently <60 mL/min signify possible Chronic Kidney Disease.   . Anion gap 06/04/2015 6  5 - 15 Final  . Magnesium 06/04/2015 1.9  1.7 - 2.4 mg/dL Final      STUDIES: Guidance study., TP3,BRAF,JAK3,BRCA1 mutated in  the blood testing (August, 2016) ASSESSMENT: Recurrent or another primary with left upper lobe invading vertebral body. After 3 cycles of chemotherapy her PET scan shows some increase in hypermetabolic is in pleural-based mass. There is an enlarging nodule in the left lower lobe Considering  that is difficult to evaluate immunotherapy responses.  I would like to try 2 more cycles of  TECENTRIQ before reevaluation is planned. There is continuing progression of the disease than anti-PDL drug would be discontinued Scan has been reviewed independently and reviewed with the patient.  Left lower lobe growth was very minimal.    Carcinoma, lung   Staging form: Lung, AJCC 7th Edition    Clinical: Stage IIIA (T4, N0, M0) - Signed by Forest Gleason, MD on 10/29/2014   Forest Gleason, MD   06/04/2015 9:48 AM

## 2015-06-18 ENCOUNTER — Ambulatory Visit: Payer: Medicare HMO

## 2015-06-18 ENCOUNTER — Other Ambulatory Visit: Payer: Medicare HMO

## 2015-06-18 ENCOUNTER — Ambulatory Visit: Payer: Medicare HMO | Admitting: Oncology

## 2015-06-25 ENCOUNTER — Inpatient Hospital Stay: Payer: Medicare HMO

## 2015-06-25 ENCOUNTER — Encounter: Payer: Self-pay | Admitting: Oncology

## 2015-06-25 ENCOUNTER — Inpatient Hospital Stay: Payer: Medicare HMO | Attending: Oncology

## 2015-06-25 ENCOUNTER — Inpatient Hospital Stay (HOSPITAL_BASED_OUTPATIENT_CLINIC_OR_DEPARTMENT_OTHER): Payer: Medicare HMO | Admitting: Oncology

## 2015-06-25 VITALS — BP 135/75 | HR 99 | Temp 97.6°F | Resp 18 | Wt 143.3 lb

## 2015-06-25 DIAGNOSIS — Z923 Personal history of irradiation: Secondary | ICD-10-CM | POA: Insufficient documentation

## 2015-06-25 DIAGNOSIS — C3492 Malignant neoplasm of unspecified part of left bronchus or lung: Secondary | ICD-10-CM

## 2015-06-25 DIAGNOSIS — C3490 Malignant neoplasm of unspecified part of unspecified bronchus or lung: Secondary | ICD-10-CM

## 2015-06-25 DIAGNOSIS — C3412 Malignant neoplasm of upper lobe, left bronchus or lung: Secondary | ICD-10-CM | POA: Diagnosis not present

## 2015-06-25 DIAGNOSIS — J449 Chronic obstructive pulmonary disease, unspecified: Secondary | ICD-10-CM | POA: Insufficient documentation

## 2015-06-25 DIAGNOSIS — Z5111 Encounter for antineoplastic chemotherapy: Secondary | ICD-10-CM | POA: Insufficient documentation

## 2015-06-25 DIAGNOSIS — J45909 Unspecified asthma, uncomplicated: Secondary | ICD-10-CM | POA: Diagnosis not present

## 2015-06-25 DIAGNOSIS — Z87891 Personal history of nicotine dependence: Secondary | ICD-10-CM | POA: Insufficient documentation

## 2015-06-25 DIAGNOSIS — R21 Rash and other nonspecific skin eruption: Secondary | ICD-10-CM | POA: Insufficient documentation

## 2015-06-25 DIAGNOSIS — Z79899 Other long term (current) drug therapy: Secondary | ICD-10-CM

## 2015-06-25 DIAGNOSIS — I959 Hypotension, unspecified: Secondary | ICD-10-CM | POA: Insufficient documentation

## 2015-06-25 LAB — CBC WITH DIFFERENTIAL/PLATELET
BASOS ABS: 0.1 10*3/uL (ref 0–0.1)
BASOS PCT: 1 %
Eosinophils Absolute: 0.2 10*3/uL (ref 0–0.7)
Eosinophils Relative: 3 %
HEMATOCRIT: 35 % — AB (ref 40.0–52.0)
Hemoglobin: 11.8 g/dL — ABNORMAL LOW (ref 13.0–18.0)
LYMPHS PCT: 13 %
Lymphs Abs: 1 10*3/uL (ref 1.0–3.6)
MCH: 31.1 pg (ref 26.0–34.0)
MCHC: 33.8 g/dL (ref 32.0–36.0)
MCV: 92 fL (ref 80.0–100.0)
Monocytes Absolute: 0.9 10*3/uL (ref 0.2–1.0)
Monocytes Relative: 12 %
NEUTROS ABS: 5.6 10*3/uL (ref 1.4–6.5)
NEUTROS PCT: 71 %
Platelets: 143 10*3/uL — ABNORMAL LOW (ref 150–440)
RBC: 3.8 MIL/uL — AB (ref 4.40–5.90)
RDW: 16.8 % — ABNORMAL HIGH (ref 11.5–14.5)
WBC: 7.8 10*3/uL (ref 3.8–10.6)

## 2015-06-25 LAB — COMPREHENSIVE METABOLIC PANEL
ALBUMIN: 3.7 g/dL (ref 3.5–5.0)
ALT: 13 U/L — AB (ref 17–63)
ANION GAP: 5 (ref 5–15)
AST: 16 U/L (ref 15–41)
Alkaline Phosphatase: 68 U/L (ref 38–126)
BILIRUBIN TOTAL: 0.5 mg/dL (ref 0.3–1.2)
BUN: 19 mg/dL (ref 6–20)
CO2: 28 mmol/L (ref 22–32)
Calcium: 8.3 mg/dL — ABNORMAL LOW (ref 8.9–10.3)
Chloride: 100 mmol/L — ABNORMAL LOW (ref 101–111)
Creatinine, Ser: 0.88 mg/dL (ref 0.61–1.24)
GFR calc Af Amer: >60 mL/min (ref >60)
GLUCOSE: 104 mg/dL — AB (ref 65–99)
POTASSIUM: 4.5 mmol/L (ref 3.5–5.1)
Sodium: 133 mmol/L — ABNORMAL LOW (ref 135–145)
TOTAL PROTEIN: 7.2 g/dL (ref 6.5–8.1)

## 2015-06-25 LAB — MAGNESIUM: MAGNESIUM: 2 mg/dL (ref 1.7–2.4)

## 2015-06-25 MED ORDER — SODIUM CHLORIDE 0.9 % IV SOLN
Freq: Once | INTRAVENOUS | Status: AC
  Administered 2015-06-25: 11:00:00 via INTRAVENOUS
  Filled 2015-06-25: qty 1000

## 2015-06-25 MED ORDER — ATEZOLIZUMAB CHEMO INJECTION 1200 MG/20ML
1200.0000 mg | Freq: Once | INTRAVENOUS | Status: AC
  Administered 2015-06-25: 1200 mg via INTRAVENOUS
  Filled 2015-06-25: qty 20

## 2015-06-26 ENCOUNTER — Encounter: Payer: Self-pay | Admitting: Oncology

## 2015-06-26 NOTE — Progress Notes (Signed)
[place] @ [facility] Telephone:(336) [phone]  Fax:(336) [place]: 1929-12-23  MR#: [id number]  FKC#:[id number]  Patient Care Team: Albina Billet, MD as PCP - General (Internal Medicine)  CHIEF COMPLAINT:  Chief Complaint  Patient presents with  . Lung Cancer    Oncology History   1.  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 Further staging workup with PET scan pending (diagnosis based on  CT scan ) July, 2016 2. SB RT for a T1 lesion of the left lung superior segment left upper lobe Biopsy was consistent with non-small cell carcinoma of lung in February of 2015 3.  Started radiation and chemotherapy for left upper lobe recurrent disease versus second primary from November 10, 2014   4.  Patient has finished total 6 weekly cycle of carboplatinum and Taxol on December 15, 2014 Will   Be finished   With radiation therapy by December 21, 2014 5.  Persistent disease and persistent pain.  Patient has been started on TECENTRIQ from March 05, 2015       INTERVAL HISTORY: 82 year old gentleman was started on chemotherapy with platinum and Taxol starting from July of 2016.  Patient is here for ongoing evaluation and treatment consideration.  Appetite is improved.  Chest pain is improved.  No nausea.  No vomiting.    For ongoing evaluation and treatment consideration. Appetite has improved.  Chest pain is improved.  His shortness of breath is getting better.  Nausea no vomiting no diarrhea    PERFORMANCE STATUS (ECOG):01 HEENT:  No visual changes, runny nose, sore throat, mouth sores or tenderness. Lungs: Dry hacking cough.  No hemoptysis. Cardiac:  No chest pain, palpitations, orthopnea, or PND. GI:  No nausea, vomiting, diarrhea, constipation, melena or hematochezia. GU:  No urgency, frequency, dysuria, or hematuria. Musculoskeletal:  Left shoulder pain Extremities:  No pain or swelling. Skin: Rash in the left upper extremity left lower  extremity Neuro:  No headache, numbness or weakness, balance or coordination issues. Endocrine:  No diabetes, thyroid issues, hot flashes or night sweats. Psych:  No mood changes, depression or anxiety. Pain:  No focal pain. Review of systems:  All other systems reviewed and found to be negative. As per HPI. Otherwise, a complete review of systems is negatve.  PAST MEDICAL HISTORY: Past Medical History  Diagnosis Date  . Major depressive disorder, recurrent, severe with psychotic features ([place])   . Persistent disorder of initiating or maintaining sleep   . Generalized anxiety disorder   . Major depressive disorder, recurrent, severe with psychotic features ([place])   . Persistent disorder of initiating or maintaining sleep   . Asthma   . Cancer ([place])     Left lung  . COPD (chronic obstructive pulmonary disease) ([place])     PAST SURGICAL HISTORY: Past Surgical History  Procedure Laterality Date  . Hemorroidectomy    . Lung surgery      FAMILY HISTORY Family History  Problem Relation Age of Onset  . COPD Mother   . Lung cancer Father   . Kidney disease Neg Hx   . Prostate cancer Neg Hx     ADVANCED DIRECTIVES:  Patient does have advance healthcare directive, Patient   does not desire to make any changes HEALTH MAINTENANCE: Social History  Substance Use Topics  . Smoking status: Former Research scientist (life sciences)  . Smokeless tobacco: Never Used     Comment: quit 18 years ago  . Alcohol Use: No  Allergies  Allergen Reactions  . Penicillins     Current Outpatient Prescriptions  Medication Sig Dispense Refill  . albuterol (PROVENTIL HFA;VENTOLIN HFA) 108 (90 BASE) MCG/ACT inhaler Inhale 1-2 puffs into the lungs QID.     Marland Kitchen atorvastatin (LIPITOR) 40 MG tablet Take 1 tablet by mouth daily.    Marland Kitchen BREO ELLIPTA 100-25 MCG/INH AEPB     . econazole nitrate 1 % cream Apply 1 application topically 2 (two) times daily.     Marland Kitchen esomeprazole (NEXIUM) 40 MG capsule Take 40 mg by mouth daily at 12  noon.    . finasteride (PROSCAR) 5 MG tablet Take 1 tablet (5 mg total) by mouth daily. 30 tablet 12  . HYDROcodone-acetaminophen (NORCO/VICODIN) 5-325 MG per tablet Take 1 tablet by mouth every 4 (four) hours as needed for moderate pain. 30 tablet 0  . LORazepam (ATIVAN) 0.5 MG tablet Take 1 tablet (0.5 mg total) by mouth every 8 (eight) hours. 270 tablet 1  . LORazepam (ATIVAN) 1 MG tablet One tablet at bedtime and one tabelt in the morning as needed. 180 tablet 1  . magnesium citrate SOLN Take 296 mLs (1 Bottle total) by mouth once. 195 mL 2  . mometasone-formoterol (DULERA) 200-5 MCG/ACT AERO Inhale 2 puffs into the lungs 2 (two) times daily.     Marland Kitchen nystatin cream (MYCOSTATIN) Apply 1 application topically 2 (two) times daily. 30 g 0  . Polyethylene Glycol 3350 GRAN     . potassium chloride SA (K-DUR,KLOR-CON) 20 MEQ tablet Take 1 tablet (20 mEq total) by mouth daily. 30 tablet 3  . QUEtiapine (SEROQUEL XR) 300 MG 24 hr tablet Take 1 tablet (300 mg total) by mouth at bedtime. 90 tablet 1  . tamsulosin (FLOMAX) 0.4 MG CAPS capsule Take 1 capsule by mouth daily.    Marland Kitchen tiotropium (SPIRIVA) 18 MCG inhalation capsule Place into inhaler and inhale daily.    Marland Kitchen triamcinolone cream (KENALOG) 0.1 %     . venlafaxine XR (EFFEXOR-XR) 150 MG 24 hr capsule Take 1 capsule (150 mg total) by mouth daily. 90 capsule 1  . zolpidem (AMBIEN) 10 MG tablet Take 1 tablet (10 mg total) by mouth at bedtime. 90 tablet 1   No current facility-administered medications for this visit.    OBJECTIVE:  Filed Vitals:   06/25/15 1035  BP: 135/75  Pulse: 99  Temp: 97.6 F (36.4 C)  Resp: 18     Body mass index is 22.44 kg/(m^2).    ECOG FS:1 - Symptomatic but completely ambulatory  PHYSICAL EXAM: GENERAL:  Well developed, well nourished, sitting comfortably in the exam room in no acute distress. MENTAL STATUS:  Alert and oriented to person, place and time. HEAD: Alopecia  Normocephalic, atraumatic, face symmetric,  no Cushingoid features. EYES:  Pupils equal round and reactive to light and accomodation.  No conjunctivitis or scleral icterus. ENT:  Oropharynx clear without lesion.  Tongue normal. Mucous membranes moist.  RESPIRATORY:  Clear to auscultation without rales, wheezes or rhonchi. CARDIOVASCULAR:  Regular rate and rhythm without murmur, rub or gallop. BREAST:  Right breast without masses, skin changes or nipple discharge.  Left breast without masses, skin changes or nipple discharge. ABDOMEN:  Soft, non-tender, with active bowel sounds, and no hepatosplenomegaly.  No masses. BACK:  No CVA tenderness.  No tenderness on percussion of the back or rib cage. SKIN: Maculopapular rash. EXTREMITIES: No edema, no skin discoloration or tenderness.  No palpable cords. LYMPH NODES: No palpable cervical, supraclavicular, axillary  or inguinal adenopathy  NEUROLOGICAL: Unremarkable. PSYCH:  Appropriate.   LAB RESULTS:  Appointment on 06/25/2015  Component Date Value Ref Range Status  . WBC 06/25/2015 7.8  3.8 - 10.6 K/uL Final  . RBC 06/25/2015 3.80* 4.40 - 5.90 MIL/uL Final  . Hemoglobin 06/25/2015 11.8* 13.0 - 18.0 g/dL Final  . HCT 06/25/2015 35.0* 40.0 - 52.0 % Final  . MCV 06/25/2015 92.0  80.0 - 100.0 fL Final  . MCH 06/25/2015 31.1  26.0 - 34.0 pg Final  . MCHC 06/25/2015 33.8  32.0 - 36.0 g/dL Final  . RDW 06/25/2015 16.8* 11.5 - 14.5 % Final  . Platelets 06/25/2015 143* 150 - 440 K/uL Final  . Neutrophils Relative % 06/25/2015 71   Final  . Neutro Abs 06/25/2015 5.6  1.4 - 6.5 K/uL Final  . Lymphocytes Relative 06/25/2015 13   Final  . Lymphs Abs 06/25/2015 1.0  1.0 - 3.6 K/uL Final  . Monocytes Relative 06/25/2015 12   Final  . Monocytes Absolute 06/25/2015 0.9  0.2 - 1.0 K/uL Final  . Eosinophils Relative 06/25/2015 3   Final  . Eosinophils Absolute 06/25/2015 0.2  0 - 0.7 K/uL Final  . Basophils Relative 06/25/2015 1   Final  . Basophils Absolute 06/25/2015 0.1  0 - 0.1 K/uL Final  .  Sodium 06/25/2015 133* 135 - 145 mmol/L Final  . Potassium 06/25/2015 4.5  3.5 - 5.1 mmol/L Final  . Chloride 06/25/2015 100* 101 - 111 mmol/L Final  . CO2 06/25/2015 28  22 - 32 mmol/L Final  . Glucose, Bld 06/25/2015 104* 65 - 99 mg/dL Final  . BUN 06/25/2015 19  6 - 20 mg/dL Final  . Creatinine, Ser 06/25/2015 0.88  0.61 - 1.24 mg/dL Final  . Calcium 06/25/2015 8.3* 8.9 - 10.3 mg/dL Final  . Total Protein 06/25/2015 7.2  6.5 - 8.1 g/dL Final  . Albumin 06/25/2015 3.7  3.5 - 5.0 g/dL Final  . AST 06/25/2015 16  15 - 41 U/L Final  . ALT 06/25/2015 13* 17 - 63 U/L Final  . Alkaline Phosphatase 06/25/2015 68  38 - 126 U/L Final  . Total Bilirubin 06/25/2015 0.5  0.3 - 1.2 mg/dL Final  . GFR calc non Af Amer 06/25/2015 >60  >60 mL/min Final  . GFR calc Af Amer 06/25/2015 >60  >60 mL/min Final   Comment: (NOTE) The eGFR has been calculated using the CKD EPI equation. This calculation has not been validated in all clinical situations. eGFR's persistently <60 mL/min signify possible Chronic Kidney Disease.   . Anion gap 06/25/2015 5  5 - 15 Final  . Magnesium 06/25/2015 2.0  1.7 - 2.4 mg/dL Final      STUDIES: Guidance study., TP3,BRAF,JAK3,BRCA1 mutated in the blood testing (August, 2016) ASSESSMENT: Recurrent or another primary with left upper lobe invading vertebral body. Patient's pain has improved shortness of breath is getting better.  All lab data has been evaluated.  Patient will continue to centric today and in 3 weeks a PET scan prior to April chemotherapy has been prolonged.  Foe restaging Carcinoma, lung   Staging form: Lung, AJCC 7th Edition    Clinical: Stage IIIA (T4, N0, M0) - Signed by Forest Gleason, MD on 10/29/2014   Forest Gleason, MD   06/26/2015 8:35 AM

## 2015-06-29 ENCOUNTER — Ambulatory Visit (INDEPENDENT_AMBULATORY_CARE_PROVIDER_SITE_OTHER): Payer: Medicare HMO | Admitting: Psychiatry

## 2015-06-29 ENCOUNTER — Encounter: Payer: Self-pay | Admitting: Psychiatry

## 2015-06-29 DIAGNOSIS — F411 Generalized anxiety disorder: Secondary | ICD-10-CM

## 2015-06-29 MED ORDER — LORAZEPAM 0.5 MG PO TABS: 0.5000 mg | ORAL_TABLET | Freq: Four times a day (QID) | ORAL | Status: DC | PRN

## 2015-06-29 MED ORDER — QUETIAPINE FUMARATE ER 300 MG PO TB24: 300.0000 mg | ORAL_TABLET | Freq: Every day | ORAL | Status: DC

## 2015-06-29 MED ORDER — VENLAFAXINE HCL ER 150 MG PO CP24: 150.0000 mg | ORAL_CAPSULE | Freq: Every day | ORAL | Status: DC

## 2015-06-29 NOTE — Progress Notes (Signed)
Patient ID: Timothy Ali, male   DOB: 1930-03-10, 82 y.o.   MRN: [id number] [facility] MD/PA/NP OP Progress Note  06/29/2015 10:05 AM Timothy Ali  MRN:  [id number]  Subjective:  Patient presents for follow-up of his generalized anxiety disorder, insomnia and major depressive disorder. Patient was previously seen by Dr. Jimmye Norman. This is the first visit for this physician with this patient. Patient reports that he's been doing well. States that he continues to get chemotherapy for his cancer. States he is eating well and sleeping well. He denies any mood symptoms. Patient is concerned about getting his lorazepam filled as usual. States that he takes 1 mg in the morning and 1 mg at night. States he takes 0.5 mg tablets 3 times daily. Patient is not not interested in increasing his Effexor. He does endorse some depression and loneliness. States his comfortable with the Effexor dose and Seroquel dose. However he does not want to change the lorazepam dose. Discussed with him the risk of falls, risk of sedation and memory impairment. Also discussed with him that given his age and his failing health it could lead to complications. However patient not interested in changing anything.   Chief Complaint    Follow-up; Medication Refill     Visit Diagnosis:   No diagnosis found.  Past Medical History:  Past Medical History  Diagnosis Date  . Major depressive disorder, recurrent, severe with psychotic features ([place])   . Persistent disorder of initiating or maintaining sleep   . Generalized anxiety disorder   . Major depressive disorder, recurrent, severe with psychotic features ([place])   . Persistent disorder of initiating or maintaining sleep   . Asthma   . Cancer ([place])     Left lung  . COPD (chronic obstructive pulmonary disease) [facility])     Past Surgical History  Procedure Laterality Date  . Hemorroidectomy    . Lung surgery     Family History:  Family History  Problem Relation Age of Onset  . COPD  Mother   . Lung cancer Father   . Kidney disease Neg Hx   . Prostate cancer Neg Hx    Social History:  Social History   Social History  . Marital Status: Widowed    Spouse Name: N/A  . Number of Children: N/A  . Years of Education: N/A   Social History Main Topics  . Smoking status: Former Research scientist (life sciences)  . Smokeless tobacco: Never Used     Comment: quit 18 years ago  . Alcohol Use: No  . Drug Use: No  . Sexual Activity: No   Other Topics Concern  . None   Social History Narrative   Additional History:   Assessment:   Musculoskeletal: Strength & Muscle Tone: within normal limits Gait & Station: normal Patient leans: N/A  Psychiatric Specialty Exam: Anxiety Symptoms include nervous/anxious behavior. Patient reports no insomnia (doing well with Ambien) or suicidal ideas.    Depression        Associated symptoms include does not have insomnia (doing well with Ambien) and no suicidal ideas.  Past medical history includes anxiety.     Review of Systems  Psychiatric/Behavioral: Negative for depression, suicidal ideas, hallucinations, memory loss and substance abuse. The patient is nervous/anxious. The patient does not have insomnia (doing well with Ambien).   All other systems reviewed and are negative.   Blood pressure 142/88, pulse 100, temperature 97.9 F (36.6 C), temperature source Tympanic, height '5\' 7"'$  (1.702 m), weight 143 lb 12.8  oz (65.227 kg), SpO2 88 %.Body mass index is 22.52 kg/(m^2).  General Appearance: Neat and Well Groomed  Eye Contact:  Good  Speech:  Clear and Coherent and Normal Rate  Volume:  Normal  Mood:  good  Affect:  Congruent  Thought Process:  Linear and Logical  Orientation:  Full (Time, Place, and Person)  Thought Content:  Not verbalizing paranoid ideation as in past visit  Suicidal Thoughts:  No  Homicidal Thoughts:  No  Memory:  Immediate;   Good Recent;   Good Remote;   Good  Judgement:  Good  Insight:  Fair  Psychomotor Activity:   Negative  Concentration:  Good  Recall:  Good  Fund of Knowledge: Good  Language: Good  Akathisia:  Negative  Handed:  Right unknown  AIMS (if indicated):  Not done  Assets:  Desire for Improvement  ADL's:  Intact  Cognition: WNL  Sleep:  5 hour per night   Is the patient at risk to self?  No. Has the patient been a risk to self in the past 6 months?  No. Has the patient been a risk to self within the distant past?  No. Is the patient a risk to others?  No. Has the patient been a risk to others in the past 6 months?  No. Has the patient been a risk to others within the distant past?  No.  Current Medications: Current Outpatient Prescriptions  Medication Sig Dispense Refill  . albuterol (PROVENTIL HFA;VENTOLIN HFA) 108 (90 BASE) MCG/ACT inhaler Inhale 1-2 puffs into the lungs QID.     Marland Kitchen atorvastatin (LIPITOR) 40 MG tablet Take 1 tablet by mouth daily.    Marland Kitchen BREO ELLIPTA 100-25 MCG/INH AEPB     . econazole nitrate 1 % cream Apply 1 application topically 2 (two) times daily.     Marland Kitchen esomeprazole (NEXIUM) 40 MG capsule Take 40 mg by mouth daily at 12 noon.    . finasteride (PROSCAR) 5 MG tablet Take 1 tablet (5 mg total) by mouth daily. 30 tablet 12  . HYDROcodone-acetaminophen (NORCO/VICODIN) 5-325 MG per tablet Take 1 tablet by mouth every 4 (four) hours as needed for moderate pain. 30 tablet 0  . LORazepam (ATIVAN) 0.5 MG tablet Take 1 tablet (0.5 mg total) by mouth every 8 (eight) hours. 270 tablet 1  . LORazepam (ATIVAN) 1 MG tablet One tablet at bedtime and one tabelt in the morning as needed. 180 tablet 1  . magnesium citrate SOLN Take 296 mLs (1 Bottle total) by mouth once. 195 mL 2  . mometasone-formoterol (DULERA) 200-5 MCG/ACT AERO Inhale 2 puffs into the lungs 2 (two) times daily.     Marland Kitchen nystatin cream (MYCOSTATIN) Apply 1 application topically 2 (two) times daily. 30 g 0  . Polyethylene Glycol 3350 GRAN     . potassium chloride SA (K-DUR,KLOR-CON) 20 MEQ tablet Take 1 tablet  (20 mEq total) by mouth daily. 30 tablet 3  . QUEtiapine (SEROQUEL XR) 300 MG 24 hr tablet Take 1 tablet (300 mg total) by mouth at bedtime. 90 tablet 1  . tamsulosin (FLOMAX) 0.4 MG CAPS capsule Take 1 capsule by mouth daily.    Marland Kitchen tiotropium (SPIRIVA) 18 MCG inhalation capsule Place into inhaler and inhale daily.    Marland Kitchen triamcinolone cream (KENALOG) 0.1 %     . venlafaxine XR (EFFEXOR-XR) 150 MG 24 hr capsule Take 1 capsule (150 mg total) by mouth daily. 90 capsule 1  . zolpidem (AMBIEN) 10 MG tablet Take  1 tablet (10 mg total) by mouth at bedtime. 90 tablet 1   No current facility-administered medications for this visit.    Medical Decision Making:  Established Problem, Stable/Improving (1), Review of Medication Regimen & Side Effects (2) and Review of New Medication or Change in Dosage (2)  Treatment Plan Summary:Medication management and Plan   Major depressive disorder, recurrent severe with psychotic features-stable on Effexor and Seroquel.  Generalized anxiety disorder-we'll continue use the Effexor and Discussed decreasing the lorazepam. We cut the lorazepam 2.5 mg to be taken every 6 hours as needed for anxiety. Patient was given 90 tablets to last him for about month. Patient is aware that he will have to find a new physician if he  wants to continue on the higher dose of the lorazepam. Patient was educated about the side effects of being on benzodiazepines at his age and discussed the adverse effects of increased sedation, risk of falls and risk of memory impairment. However patient insists that he needs his 3-1/2 g of lorazepam daily. He was told that we will cut his dose to 2 mg daily in divided doses and if he would like to be followed at this clinic he can make an appointment in month's time.Marland Kitchen   Linsie Lupo 06/29/2015, 10:05 AM

## 2015-07-08 ENCOUNTER — Telehealth: Payer: Self-pay | Admitting: Psychiatry

## 2015-07-08 NOTE — Telephone Encounter (Signed)
Patient is on multiple medications that lead to sedation. I cannot start him on anything new at this time. He can take the Lorazepam at bedtime instead of daytime and see if it helps with improved sleep.

## 2015-07-08 NOTE — Telephone Encounter (Signed)
pt was called and given the instructions per dr. Einar Grad

## 2015-07-13 ENCOUNTER — Inpatient Hospital Stay (HOSPITAL_BASED_OUTPATIENT_CLINIC_OR_DEPARTMENT_OTHER): Payer: Medicare HMO | Admitting: Oncology

## 2015-07-13 ENCOUNTER — Encounter: Payer: Self-pay | Admitting: Oncology

## 2015-07-13 ENCOUNTER — Inpatient Hospital Stay: Payer: Medicare HMO

## 2015-07-13 VITALS — BP 109/67 | HR 93 | Temp 96.1°F | Resp 18 | Wt 141.1 lb

## 2015-07-13 DIAGNOSIS — Z5111 Encounter for antineoplastic chemotherapy: Secondary | ICD-10-CM | POA: Diagnosis not present

## 2015-07-13 DIAGNOSIS — Z79899 Other long term (current) drug therapy: Secondary | ICD-10-CM

## 2015-07-13 DIAGNOSIS — C3490 Malignant neoplasm of unspecified part of unspecified bronchus or lung: Secondary | ICD-10-CM

## 2015-07-13 DIAGNOSIS — Z923 Personal history of irradiation: Secondary | ICD-10-CM

## 2015-07-13 DIAGNOSIS — C3412 Malignant neoplasm of upper lobe, left bronchus or lung: Secondary | ICD-10-CM | POA: Diagnosis not present

## 2015-07-13 DIAGNOSIS — I959 Hypotension, unspecified: Secondary | ICD-10-CM

## 2015-07-13 DIAGNOSIS — C3492 Malignant neoplasm of unspecified part of left bronchus or lung: Secondary | ICD-10-CM

## 2015-07-13 LAB — CBC WITH DIFFERENTIAL/PLATELET
Basophils Absolute: 0 10*3/uL (ref 0–0.1)
Basophils Relative: 1 %
Eosinophils Absolute: 0.2 10*3/uL (ref 0–0.7)
Eosinophils Relative: 2 %
HCT: 36.3 % — ABNORMAL LOW (ref 40.0–52.0)
Hemoglobin: 12.3 g/dL — ABNORMAL LOW (ref 13.0–18.0)
Lymphocytes Relative: 12 %
Lymphs Abs: 1.1 10*3/uL (ref 1.0–3.6)
MCH: 31.5 pg (ref 26.0–34.0)
MCHC: 33.9 g/dL (ref 32.0–36.0)
MCV: 93 fL (ref 80.0–100.0)
Monocytes Absolute: 0.7 10*3/uL (ref 0.2–1.0)
Monocytes Relative: 8 %
Neutro Abs: 6.8 10*3/uL — ABNORMAL HIGH (ref 1.4–6.5)
Neutrophils Relative %: 77 %
Platelets: 152 10*3/uL (ref 150–440)
RBC: 3.9 MIL/uL — ABNORMAL LOW (ref 4.40–5.90)
RDW: 16 % — ABNORMAL HIGH (ref 11.5–14.5)
WBC: 8.8 10*3/uL (ref 3.8–10.6)

## 2015-07-13 LAB — COMPREHENSIVE METABOLIC PANEL
ALT: 12 U/L — ABNORMAL LOW (ref 17–63)
AST: 19 U/L (ref 15–41)
Albumin: 3.5 g/dL (ref 3.5–5.0)
Alkaline Phosphatase: 67 U/L (ref 38–126)
Anion gap: 7 (ref 5–15)
BILIRUBIN TOTAL: 0.4 mg/dL (ref 0.3–1.2)
BUN: 19 mg/dL (ref 6–20)
CHLORIDE: 100 mmol/L — AB (ref 101–111)
CO2: 27 mmol/L (ref 22–32)
CREATININE: 0.98 mg/dL (ref 0.61–1.24)
Calcium: 8.3 mg/dL — ABNORMAL LOW (ref 8.9–10.3)
Glucose, Bld: 154 mg/dL — ABNORMAL HIGH (ref 65–99)
POTASSIUM: 4 mmol/L (ref 3.5–5.1)
Sodium: 134 mmol/L — ABNORMAL LOW (ref 135–145)
TOTAL PROTEIN: 6.9 g/dL (ref 6.5–8.1)

## 2015-07-13 NOTE — Progress Notes (Signed)
Patient states he fell last week.  Feels dizzy today.  BP 82/49  HR 106

## 2015-07-13 NOTE — Progress Notes (Signed)
[place] @ [facility] [phone]  Fax:([phone]) [place]: 1930/01/03  MR#: [id number]  DGL#:[id number]  Patient Care Team: Albina Billet, MD as PCP - General (Internal Medicine)  CHIEF COMPLAINT:  Chief Complaint  Patient presents with  . Lung Cancer    Oncology History   1.  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 Further staging workup with PET scan pending (diagnosis based on  CT scan ) July, 2016 2. SB RT for a T1 lesion of the left lung superior segment left upper lobe Biopsy was consistent with non-small cell carcinoma of lung in February of 2015 3.  Started radiation and chemotherapy for left upper lobe recurrent disease versus second primary from November 10, 2014   4.  Patient has finished total 6 weekly cycle of carboplatinum and Taxol on December 15, 2014 Will   Be finished   With radiation therapy by December 21, 2014 5.  Persistent disease and persistent pain.  Patient has been started on TECENTRIQ from March 05, 2015       INTERVAL HISTORY: 82 year old gentleman was started on chemotherapy with platinum and Taxol starting from July of 2016.  Patient is here for ongoing evaluation and treatment consideration.  Appetite is improved.  Chest pain is improved.  No nausea.  No vomiting.    For ongoing evaluation and treatment consideration. Appetite has improved.  Chest pain is improved.  His shortness of breath is getting better.  Nausea no vomiting no diarrhea. Patient is here for ongoing evaluation and treatment consideration.  Knee she lipase and blood pressure was slightly low but when it was rechecked was more than 100. No nausea no vomiting no diarrhea.  No chest pain.    PERFORMANCE STATUS (ECOG):01 HEENT:  No visual changes, runny nose, sore throat, mouth sores or tenderness. Lungs: Dry hacking cough.  No hemoptysis. Cardiac:  No chest pain, palpitations, orthopnea, or PND. GI:  No nausea, vomiting, diarrhea,  constipation, melena or hematochezia. GU:  No urgency, frequency, dysuria, or hematuria. Musculoskeletal:  Left shoulder pain Extremities:  No pain or swelling. Skin: Rash in the left upper extremity left lower extremity Neuro:  No headache, numbness or weakness, balance or coordination issues. Endocrine:  No diabetes, thyroid issues, hot flashes or night sweats. Psych:  No mood changes, depression or anxiety. Pain:  No focal pain. Review of systems:  All other systems reviewed and found to be negative. As per HPI. Otherwise, a complete review of systems is negatve.  PAST MEDICAL HISTORY: Past Medical History  Diagnosis Date  . Major depressive disorder, recurrent, severe with psychotic features ([place])   . Persistent disorder of initiating or maintaining sleep   . Generalized anxiety disorder   . Major depressive disorder, recurrent, severe with psychotic features ([place])   . Persistent disorder of initiating or maintaining sleep   . Asthma   . Cancer ([place])     Left lung  . COPD (chronic obstructive pulmonary disease) ([place])     PAST SURGICAL HISTORY: Past Surgical History  Procedure Laterality Date  . Hemorroidectomy    . Lung surgery      FAMILY HISTORY Family History  Problem Relation Age of Onset  . COPD Mother   . Lung cancer Father   . Kidney disease Neg Hx   . Prostate cancer Neg Hx     ADVANCED DIRECTIVES:  Patient does have advance healthcare directive, Patient   does not desire  to make any changes HEALTH MAINTENANCE: Social History  Substance Use Topics  . Smoking status: Former Research scientist (life sciences)  . Smokeless tobacco: Never Used     Comment: quit 18 years ago  . Alcohol Use: No      Allergies  Allergen Reactions  . Penicillins     Current Outpatient Prescriptions  Medication Sig Dispense Refill  . albuterol (PROVENTIL HFA;VENTOLIN HFA) 108 (90 BASE) MCG/ACT inhaler Inhale 1-2 puffs into the lungs QID.     Marland Kitchen atorvastatin (LIPITOR) 40 MG tablet Take 1 tablet by  mouth daily.    Marland Kitchen BREO ELLIPTA 100-25 MCG/INH AEPB     . econazole nitrate 1 % cream Apply 1 application topically 2 (two) times daily.     Marland Kitchen esomeprazole (NEXIUM) 40 MG capsule Take 40 mg by mouth daily at 12 noon.    . finasteride (PROSCAR) 5 MG tablet Take 1 tablet (5 mg total) by mouth daily. 30 tablet 12  . HYDROcodone-acetaminophen (NORCO/VICODIN) 5-325 MG per tablet Take 1 tablet by mouth every 4 (four) hours as needed for moderate pain. 30 tablet 0  . LORazepam (ATIVAN) 0.5 MG tablet Take 1 tablet (0.5 mg total) by mouth every 6 (six) hours as needed for anxiety. 90 tablet 0  . magnesium citrate SOLN Take 296 mLs (1 Bottle total) by mouth once. 195 mL 2  . mometasone-formoterol (DULERA) 200-5 MCG/ACT AERO Inhale 2 puffs into the lungs 2 (two) times daily.     Marland Kitchen nystatin cream (MYCOSTATIN) Apply 1 application topically 2 (two) times daily. 30 g 0  . Polyethylene Glycol 3350 GRAN     . potassium chloride SA (K-DUR,KLOR-CON) 20 MEQ tablet Take 1 tablet (20 mEq total) by mouth daily. 30 tablet 3  . QUEtiapine (SEROQUEL XR) 300 MG 24 hr tablet Take 1 tablet (300 mg total) by mouth at bedtime. 90 tablet 1  . tamsulosin (FLOMAX) 0.4 MG CAPS capsule Take 1 capsule by mouth daily.    Marland Kitchen tiotropium (SPIRIVA) 18 MCG inhalation capsule Place into inhaler and inhale daily.    Marland Kitchen triamcinolone cream (KENALOG) 0.1 %     . venlafaxine XR (EFFEXOR-XR) 150 MG 24 hr capsule Take 1 capsule (150 mg total) by mouth daily. 90 capsule 1  . zolpidem (AMBIEN) 10 MG tablet Take 1 tablet (10 mg total) by mouth at bedtime. 90 tablet 1   No current facility-administered medications for this visit.    OBJECTIVE:  Filed Vitals:   07/13/15 0908 07/13/15 1015  BP: 82/49 109/67  Pulse: 106 93  Temp: 96.1 F (35.6 C)   Resp: 18      Body mass index is 22.1 kg/(m^2).    ECOG FS:1 - Symptomatic but completely ambulatory  PHYSICAL EXAM: GENERAL:  Well developed, well nourished, sitting comfortably in the exam room  in no acute distress. MENTAL STATUS:  Alert and oriented to person, place and time. HEAD: Alopecia  Normocephalic, atraumatic, face symmetric, no Cushingoid features. EYES:  Pupils equal round and reactive to light and accomodation.  No conjunctivitis or scleral icterus. ENT:  Oropharynx clear without lesion.  Tongue normal. Mucous membranes moist.  RESPIRATORY:  Clear to auscultation without rales, wheezes or rhonchi. CARDIOVASCULAR:  Regular rate and rhythm without murmur, rub or gallop. BREAST:  Right breast without masses, skin changes or nipple discharge.  Left breast without masses, skin changes or nipple discharge. ABDOMEN:  Soft, non-tender, with active bowel sounds, and no hepatosplenomegaly.  No masses. BACK:  No CVA tenderness.  No  tenderness on percussion of the back or rib cage. SKIN: Maculopapular rash. EXTREMITIES: No edema, no skin discoloration or tenderness.  No palpable cords. LYMPH NODES: No palpable cervical, supraclavicular, axillary or inguinal adenopathy  NEUROLOGICAL: Unremarkable. PSYCH:  Appropriate.   LAB RESULTS:  Appointment on 07/13/2015  Component Date Value Ref Range Status  . WBC 07/13/2015 8.8  3.8 - 10.6 K/uL Final  . RBC 07/13/2015 3.90* 4.40 - 5.90 MIL/uL Final  . Hemoglobin 07/13/2015 12.3* 13.0 - 18.0 g/dL Final  . HCT 07/13/2015 36.3* 40.0 - 52.0 % Final  . MCV 07/13/2015 93.0  80.0 - 100.0 fL Final  . MCH 07/13/2015 31.5  26.0 - 34.0 pg Final  . MCHC 07/13/2015 33.9  32.0 - 36.0 g/dL Final  . RDW 07/13/2015 16.0* 11.5 - 14.5 % Final  . Platelets 07/13/2015 152  150 - 440 K/uL Final   PLATELET CLUMPS NOTED ON SMEAR, COUNT APPEARS ADEQUATE  . Neutrophils Relative % 07/13/2015 77%   Final  . Neutro Abs 07/13/2015 6.8* 1.4 - 6.5 K/uL Final  . Lymphocytes Relative 07/13/2015 12%   Final  . Lymphs Abs 07/13/2015 1.1  1.0 - 3.6 K/uL Final  . Monocytes Relative 07/13/2015 8%   Final  . Monocytes Absolute 07/13/2015 0.7  0.2 - 1.0 K/uL Final  .  Eosinophils Relative 07/13/2015 2%   Final  . Eosinophils Absolute 07/13/2015 0.2  0 - 0.7 K/uL Final  . Basophils Relative 07/13/2015 1%   Final  . Basophils Absolute 07/13/2015 0.0  0 - 0.1 K/uL Final  . Sodium 07/13/2015 134* 135 - 145 mmol/L Final  . Potassium 07/13/2015 4.0  3.5 - 5.1 mmol/L Final  . Chloride 07/13/2015 100* 101 - 111 mmol/L Final  . CO2 07/13/2015 27  22 - 32 mmol/L Final  . Glucose, Bld 07/13/2015 154* 65 - 99 mg/dL Final  . BUN 07/13/2015 19  6 - 20 mg/dL Final  . Creatinine, Ser 07/13/2015 0.98  0.61 - 1.24 mg/dL Final  . Calcium 07/13/2015 8.3* 8.9 - 10.3 mg/dL Final  . Total Protein 07/13/2015 6.9  6.5 - 8.1 g/dL Final  . Albumin 07/13/2015 3.5  3.5 - 5.0 g/dL Final  . AST 07/13/2015 19  15 - 41 U/L Final  . ALT 07/13/2015 12* 17 - 63 U/L Final  . Alkaline Phosphatase 07/13/2015 67  38 - 126 U/L Final  . Total Bilirubin 07/13/2015 0.4  0.3 - 1.2 mg/dL Final  . GFR calc non Af Amer 07/13/2015 >60  >60 mL/min Final  . GFR calc Af Amer 07/13/2015 >60  >60 mL/min Final   Comment: (NOTE) The eGFR has been calculated using the CKD EPI equation. This calculation has not been validated in all clinical situations. eGFR's persistently <60 mL/min signify possible Chronic Kidney Disease.   . Anion gap 07/13/2015 7  5 - 15 Final      STUDIES: Guidance study., TP3,BRAF,JAK3,BRCA1 mutated in the blood testing (August, 2016) ASSESSMENT: All lab data has been reviewed. Hypotensive and men was rechecked blood pressure was more than 100.  Patient was advised to drink a lot of fluid.  Continue chemotherapy with  TECENTRIQ and patient has been scheduled to get another PET scan for restaging prior to next chemotherapy patient will continue anti-PDL drug at present time Carcinoma, lung   Staging form: Lung, AJCC 7th Edition    Clinical: Stage IIIA (T4, N0, M0) - Signed by Forest Gleason, MD on 10/29/2014   Forest Gleason, MD   07/13/2015 4:28  PM

## 2015-07-16 ENCOUNTER — Inpatient Hospital Stay: Payer: Medicare HMO

## 2015-07-16 VITALS — BP 120/71 | HR 90 | Resp 20

## 2015-07-16 DIAGNOSIS — C3492 Malignant neoplasm of unspecified part of left bronchus or lung: Secondary | ICD-10-CM

## 2015-07-16 DIAGNOSIS — C3490 Malignant neoplasm of unspecified part of unspecified bronchus or lung: Secondary | ICD-10-CM

## 2015-07-16 DIAGNOSIS — Z5111 Encounter for antineoplastic chemotherapy: Secondary | ICD-10-CM | POA: Diagnosis not present

## 2015-07-16 MED ORDER — SODIUM CHLORIDE 0.9 % IV SOLN
Freq: Once | INTRAVENOUS | Status: AC
  Administered 2015-07-16: 14:00:00 via INTRAVENOUS
  Filled 2015-07-16: qty 1000

## 2015-07-16 MED ORDER — SODIUM CHLORIDE 0.9 % IV SOLN
1200.0000 mg | Freq: Once | INTRAVENOUS | Status: AC
  Administered 2015-07-16: 1200 mg via INTRAVENOUS
  Filled 2015-07-16: qty 20

## 2015-08-05 ENCOUNTER — Ambulatory Visit
Admission: RE | Admit: 2015-08-05 | Discharge: 2015-08-05 | Disposition: A | Discharge status: Home / Self Care | Payer: Medicare HMO | Source: Ambulatory Visit | Attending: Oncology | Admitting: Oncology

## 2015-08-05 DIAGNOSIS — R911 Solitary pulmonary nodule: Secondary | ICD-10-CM | POA: Diagnosis not present

## 2015-08-05 DIAGNOSIS — C3492 Malignant neoplasm of unspecified part of left bronchus or lung: Secondary | ICD-10-CM | POA: Diagnosis present

## 2015-08-05 LAB — GLUCOSE, CAPILLARY: Glucose-Capillary: 73 mg/dL (ref 65–99)

## 2015-08-05 MED ORDER — FLUDEOXYGLUCOSE F - 18 (FDG) INJECTION
12.2000 | Freq: Once | INTRAVENOUS | Status: AC | PRN
  Administered 2015-08-05: 12.2 via INTRAVENOUS

## 2015-08-06 ENCOUNTER — Inpatient Hospital Stay: Payer: Medicare HMO

## 2015-08-06 ENCOUNTER — Inpatient Hospital Stay: Payer: Medicare HMO | Attending: Oncology

## 2015-08-06 ENCOUNTER — Inpatient Hospital Stay (HOSPITAL_BASED_OUTPATIENT_CLINIC_OR_DEPARTMENT_OTHER): Payer: Medicare HMO | Admitting: Oncology

## 2015-08-06 VITALS — BP 132/81 | HR 91 | Temp 98.4°F | Resp 18 | Wt 139.3 lb

## 2015-08-06 DIAGNOSIS — J45909 Unspecified asthma, uncomplicated: Secondary | ICD-10-CM | POA: Insufficient documentation

## 2015-08-06 DIAGNOSIS — Z923 Personal history of irradiation: Secondary | ICD-10-CM | POA: Insufficient documentation

## 2015-08-06 DIAGNOSIS — Z87891 Personal history of nicotine dependence: Secondary | ICD-10-CM | POA: Diagnosis not present

## 2015-08-06 DIAGNOSIS — F411 Generalized anxiety disorder: Secondary | ICD-10-CM | POA: Insufficient documentation

## 2015-08-06 DIAGNOSIS — Z79899 Other long term (current) drug therapy: Secondary | ICD-10-CM

## 2015-08-06 DIAGNOSIS — M25512 Pain in left shoulder: Secondary | ICD-10-CM

## 2015-08-06 DIAGNOSIS — J449 Chronic obstructive pulmonary disease, unspecified: Secondary | ICD-10-CM | POA: Diagnosis not present

## 2015-08-06 DIAGNOSIS — C3412 Malignant neoplasm of upper lobe, left bronchus or lung: Secondary | ICD-10-CM

## 2015-08-06 DIAGNOSIS — F339 Major depressive disorder, recurrent, unspecified: Secondary | ICD-10-CM | POA: Insufficient documentation

## 2015-08-06 DIAGNOSIS — R079 Chest pain, unspecified: Secondary | ICD-10-CM

## 2015-08-06 DIAGNOSIS — Z9221 Personal history of antineoplastic chemotherapy: Secondary | ICD-10-CM | POA: Insufficient documentation

## 2015-08-06 DIAGNOSIS — R05 Cough: Secondary | ICD-10-CM | POA: Insufficient documentation

## 2015-08-06 DIAGNOSIS — C3492 Malignant neoplasm of unspecified part of left bronchus or lung: Secondary | ICD-10-CM

## 2015-08-06 LAB — CBC WITH DIFFERENTIAL/PLATELET
BASOS ABS: 0 10*3/uL (ref 0–0.1)
BASOS PCT: 1 %
EOS ABS: 0.2 10*3/uL (ref 0–0.7)
EOS PCT: 3 %
HCT: 32.8 % — ABNORMAL LOW (ref 40.0–52.0)
HEMOGLOBIN: 11.1 g/dL — AB (ref 13.0–18.0)
Lymphocytes Relative: 11 %
Lymphs Abs: 0.6 10*3/uL — ABNORMAL LOW (ref 1.0–3.6)
MCH: 30.5 pg (ref 26.0–34.0)
MCHC: 33.8 g/dL (ref 32.0–36.0)
MCV: 90.3 fL (ref 80.0–100.0)
Monocytes Absolute: 0.7 10*3/uL (ref 0.2–1.0)
Monocytes Relative: 12 %
NEUTROS PCT: 73 %
Neutro Abs: 4.2 10*3/uL (ref 1.4–6.5)
PLATELETS: 185 10*3/uL (ref 150–440)
RBC: 3.63 MIL/uL — AB (ref 4.40–5.90)
RDW: 14.8 % — ABNORMAL HIGH (ref 11.5–14.5)
WBC: 5.8 10*3/uL (ref 3.8–10.6)

## 2015-08-06 LAB — COMPREHENSIVE METABOLIC PANEL
ALBUMIN: 2.9 g/dL — AB (ref 3.5–5.0)
ALK PHOS: 66 U/L (ref 38–126)
ALT: 10 U/L — ABNORMAL LOW (ref 17–63)
ANION GAP: 9 (ref 5–15)
AST: 18 U/L (ref 15–41)
BUN: 16 mg/dL (ref 6–20)
CALCIUM: 8.1 mg/dL — AB (ref 8.9–10.3)
CO2: 27 mmol/L (ref 22–32)
Chloride: 102 mmol/L (ref 101–111)
Creatinine, Ser: 0.96 mg/dL (ref 0.61–1.24)
GFR calc Af Amer: >60 mL/min (ref >60)
GFR calc non Af Amer: >60 mL/min (ref >60)
GLUCOSE: 90 mg/dL (ref 65–99)
Potassium: 3.4 mmol/L — ABNORMAL LOW (ref 3.5–5.1)
SODIUM: 138 mmol/L (ref 135–145)
TOTAL PROTEIN: 6.7 g/dL (ref 6.5–8.1)
Total Bilirubin: 0.2 mg/dL — ABNORMAL LOW (ref 0.3–1.2)

## 2015-08-06 NOTE — Progress Notes (Signed)
Pt placed on 2L O2 via [place]. States that brought oxygen but left it in the car. After appt, pt escorted to car via wheelchair and home o2 was placed on pt. Pt instructed to wear oxygen while driving and with increased activity. Pt verbalized understanding.

## 2015-08-07 ENCOUNTER — Encounter: Payer: Self-pay | Admitting: Oncology

## 2015-08-07 NOTE — Progress Notes (Signed)
[place] @ [facility] [phone]  Fax:([phone] Gage: Dec 14, 1929  MR#: [id number]  OEV#:[id number]  Patient Care Team: Albina Billet, MD as PCP - General (Internal Medicine)  CHIEF COMPLAINT:  Chief Complaint  Patient presents with  . Lung Cancer    Oncology History   1.  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 Further staging workup with PET scan pending (diagnosis based on  CT scan ) July, 2016 2. SB RT for a T1 lesion of the left lung superior segment left upper lobe Biopsy was consistent with non-small cell carcinoma of lung in February of 2015 3.  Started radiation and chemotherapy for left upper lobe recurrent disease versus second primary from November 10, 2014   4.  Patient has finished total 6 weekly cycle of carboplatinum and Taxol on December 15, 2014 Will   Be finished   With radiation therapy by December 21, 2014 5.  Persistent disease and persistent pain.  Patient has been started on TECENTRIQ from March 05, 2015 6.TECENTRIQ  Has been discontinued in April of 2017 because of progressing disease based on PET scan       INTERVAL HISTORY: 82 year old gentleman was started on chemotherapy with platinum and Taxol starting from July of 2016.  Patient is here for ongoing evaluation and treatment consideration.  Appetite is improved.  Chest pain is improved.  No nausea.  No vomiting.    For ongoing evaluation and treatment consideration. Appetite has improved.  Chest pain is improved.  His shortness of breath is getting better.  Nausea no vomiting no diarrhea.  The patient was slightly hypoxic.  Patient is using oxygen at home but did not bring his oxygen at present time.  No diarrhea.  No rash.  Patient had PET scan for reevaluation which has been reviewed independently.    PERFORMANCE STATUS (ECOG):01 HEENT:  No visual changes, runny nose, sore throat, mouth sores or tenderness. Lungs: Dry hacking cough.  No  hemoptysis. Cardiac:  No chest pain, palpitations, orthopnea, or PND. GI:  No nausea, vomiting, diarrhea, constipation, melena or hematochezia. GU:  No urgency, frequency, dysuria, or hematuria. Musculoskeletal:  Left shoulder pain Extremities:  No pain or swelling. Skin: Rash in the left upper extremity left lower extremity Neuro:  No headache, numbness or weakness, balance or coordination issues. Endocrine:  No diabetes, thyroid issues, hot flashes or night sweats. Psych:  No mood changes, depression or anxiety. Pain:  No focal pain. Review of systems:  All other systems reviewed and found to be negative. As per HPI. Otherwise, a complete review of systems is negatve.  PAST MEDICAL HISTORY: Past Medical History  Diagnosis Date  . Major depressive disorder, recurrent, severe with psychotic features ([place])   . Persistent disorder of initiating or maintaining sleep   . Generalized anxiety disorder   . Major depressive disorder, recurrent, severe with psychotic features ([place])   . Persistent disorder of initiating or maintaining sleep   . Asthma   . Cancer ([place])     Left lung  . COPD (chronic obstructive pulmonary disease) ([place])     PAST SURGICAL HISTORY: Past Surgical History  Procedure Laterality Date  . Hemorroidectomy    . Lung surgery      FAMILY HISTORY Family History  Problem Relation Age of Onset  . COPD Mother   . Lung cancer Father   . Kidney disease Neg Hx   . Prostate cancer Neg Hx  ADVANCED DIRECTIVES:  Patient does have advance healthcare directive, Patient   does not desire to make any changes HEALTH MAINTENANCE: Social History  Substance Use Topics  . Smoking status: Former Research scientist (life sciences)  . Smokeless tobacco: Never Used     Comment: quit 18 years ago  . Alcohol Use: No      Allergies  Allergen Reactions  . Penicillins     Current Outpatient Prescriptions  Medication Sig Dispense Refill  . albuterol (PROVENTIL HFA;VENTOLIN HFA) 108 (90 BASE)  MCG/ACT inhaler Inhale 1-2 puffs into the lungs QID.     Marland Kitchen atorvastatin (LIPITOR) 40 MG tablet Take 1 tablet by mouth daily.    Marland Kitchen BREO ELLIPTA 100-25 MCG/INH AEPB     . econazole nitrate 1 % cream Apply 1 application topically 2 (two) times daily.     Marland Kitchen esomeprazole (NEXIUM) 40 MG capsule Take 40 mg by mouth daily at 12 noon.    . finasteride (PROSCAR) 5 MG tablet Take 1 tablet (5 mg total) by mouth daily. 30 tablet 12  . HYDROcodone-acetaminophen (NORCO/VICODIN) 5-325 MG per tablet Take 1 tablet by mouth every 4 (four) hours as needed for moderate pain. 30 tablet 0  . LORazepam (ATIVAN) 0.5 MG tablet Take 1 tablet (0.5 mg total) by mouth every 6 (six) hours as needed for anxiety. 90 tablet 0  . magnesium citrate SOLN Take 296 mLs (1 Bottle total) by mouth once. 195 mL 2  . mometasone-formoterol (DULERA) 200-5 MCG/ACT AERO Inhale 2 puffs into the lungs 2 (two) times daily.     Marland Kitchen nystatin cream (MYCOSTATIN) Apply 1 application topically 2 (two) times daily. 30 g 0  . Polyethylene Glycol 3350 GRAN     . potassium chloride SA (K-DUR,KLOR-CON) 20 MEQ tablet Take 1 tablet (20 mEq total) by mouth daily. 30 tablet 3  . QUEtiapine (SEROQUEL XR) 300 MG 24 hr tablet Take 1 tablet (300 mg total) by mouth at bedtime. 90 tablet 1  . tamsulosin (FLOMAX) 0.4 MG CAPS capsule Take 1 capsule by mouth daily.    Marland Kitchen tiotropium (SPIRIVA) 18 MCG inhalation capsule Place into inhaler and inhale daily.    Marland Kitchen triamcinolone cream (KENALOG) 0.1 %     . venlafaxine XR (EFFEXOR-XR) 150 MG 24 hr capsule Take 1 capsule (150 mg total) by mouth daily. 90 capsule 1  . zolpidem (AMBIEN) 10 MG tablet Take 1 tablet (10 mg total) by mouth at bedtime. 90 tablet 1   No current facility-administered medications for this visit.    OBJECTIVE:  Filed Vitals:   08/06/15 1330  BP: 132/81  Pulse: 91  Temp: 98.4 F (36.9 C)  Resp: 18     Body mass index is 21.82 kg/(m^2).    ECOG FS:1 - Symptomatic but completely  ambulatory  PHYSICAL EXAM: GENERAL:  Well developed, well nourished, sitting comfortably in the exam room in no acute distress. MENTAL STATUS:  Alert and oriented to person, place and time. HEAD: Alopecia  Normocephalic, atraumatic, face symmetric, no Cushingoid features. EYES:  Pupils equal round and reactive to light and accomodation.  No conjunctivitis or scleral icterus. ENT:  Oropharynx clear without lesion.  Tongue normal. Mucous membranes moist.  RESPIRATORY:  Clear to auscultation without rales, wheezes or rhonchi. CARDIOVASCULAR:  Regular rate and rhythm without murmur, rub or gallop. BREAST:  Right breast without masses, skin changes or nipple discharge.  Left breast without masses, skin changes or nipple discharge. ABDOMEN:  Soft, non-tender, with active bowel sounds, and no hepatosplenomegaly.  No masses. BACK:  No CVA tenderness.  No tenderness on percussion of the back or rib cage. SKIN: Maculopapular rash. EXTREMITIES: No edema, no skin discoloration or tenderness.  No palpable cords. LYMPH NODES: No palpable cervical, supraclavicular, axillary or inguinal adenopathy  NEUROLOGICAL: Unremarkable. PSYCH:  Appropriate.   LAB RESULTS:  Appointment on 08/06/2015  Component Date Value Ref Range Status  . WBC 08/06/2015 5.8  3.8 - 10.6 K/uL Final  . RBC 08/06/2015 3.63* 4.40 - 5.90 MIL/uL Final  . Hemoglobin 08/06/2015 11.1* 13.0 - 18.0 g/dL Final  . HCT 08/06/2015 32.8* 40.0 - 52.0 % Final  . MCV 08/06/2015 90.3  80.0 - 100.0 fL Final  . MCH 08/06/2015 30.5  26.0 - 34.0 pg Final  . MCHC 08/06/2015 33.8  32.0 - 36.0 g/dL Final  . RDW 08/06/2015 14.8* 11.5 - 14.5 % Final  . Platelets 08/06/2015 185  150 - 440 K/uL Final  . Neutrophils Relative % 08/06/2015 73   Final  . Neutro Abs 08/06/2015 4.2  1.4 - 6.5 K/uL Final  . Lymphocytes Relative 08/06/2015 11   Final  . Lymphs Abs 08/06/2015 0.6* 1.0 - 3.6 K/uL Final  . Monocytes Relative 08/06/2015 12   Final  . Monocytes  Absolute 08/06/2015 0.7  0.2 - 1.0 K/uL Final  . Eosinophils Relative 08/06/2015 3   Final  . Eosinophils Absolute 08/06/2015 0.2  0 - 0.7 K/uL Final  . Basophils Relative 08/06/2015 1   Final  . Basophils Absolute 08/06/2015 0.0  0 - 0.1 K/uL Final  . Sodium 08/06/2015 138  135 - 145 mmol/L Final  . Potassium 08/06/2015 3.4* 3.5 - 5.1 mmol/L Final  . Chloride 08/06/2015 102  101 - 111 mmol/L Final  . CO2 08/06/2015 27  22 - 32 mmol/L Final  . Glucose, Bld 08/06/2015 90  65 - 99 mg/dL Final  . BUN 08/06/2015 16  6 - 20 mg/dL Final  . Creatinine, Ser 08/06/2015 0.96  0.61 - 1.24 mg/dL Final  . Calcium 08/06/2015 8.1* 8.9 - 10.3 mg/dL Final  . Total Protein 08/06/2015 6.7  6.5 - 8.1 g/dL Final  . Albumin 08/06/2015 2.9* 3.5 - 5.0 g/dL Final  . AST 08/06/2015 18  15 - 41 U/L Final  . ALT 08/06/2015 10* 17 - 63 U/L Final  . Alkaline Phosphatase 08/06/2015 66  38 - 126 U/L Final  . Total Bilirubin 08/06/2015 0.2* 0.3 - 1.2 mg/dL Final  . GFR calc non Af Amer 08/06/2015 >60  >60 mL/min Final  . GFR calc Af Amer 08/06/2015 >60  >60 mL/min Final   Comment: (NOTE) The eGFR has been calculated using the CKD EPI equation. This calculation has not been validated in all clinical situations. eGFR's persistently <60 mL/min signify possible Chronic Kidney Disease.   Georgiann Hahn gap 08/06/2015 9  5 - 15 Final  Hospital Outpatient Visit on 08/05/2015  Component Date Value Ref Range Status  . Glucose-Capillary 08/05/2015 73  65 - 99 mg/dL Final      STUDIES: Guidance study., TP3,BRAF,JAK3,BRCA1 mutated in the blood testing (August, 2016) ASSESSMENT: All lab data has been reviewed. PET scan has been reviewed independently and reviewed with the patient.   Progressive disease . I explained to the patient and we will discontinue  TECENTRIQ. At present the patient is minimally symptomatic and we will continue to observe the patient   All other options had been discussed at length with the patient   Total duration of visit was 25 minutes.  50% or more time was spent in counseling patient and family regarding prognosis and options of treatment and available resources Carcinoma, lung   Staging form: Lung, AJCC 7th Edition    Clinical: Stage IIIA (T4, N0, M0) - Signed by Forest Gleason, MD on 10/29/2014   Forest Gleason, MD   08/07/2015 5:02 PM

## 2015-08-19 ENCOUNTER — Inpatient Hospital Stay
Admit: 2015-08-19 | Discharge: 2015-08-19 | Disposition: A | Discharge status: Home / Self Care | Payer: Medicare HMO | Attending: Internal Medicine | Admitting: Internal Medicine

## 2015-08-19 ENCOUNTER — Emergency Department: Payer: Medicare HMO

## 2015-08-19 ENCOUNTER — Encounter: Payer: Self-pay | Admitting: Emergency Medicine

## 2015-08-19 ENCOUNTER — Inpatient Hospital Stay
Admission: EM | Admit: 2015-08-19 | Discharge: 2015-08-21 | DRG: 180 | Disposition: A | Discharge status: Home Health | Payer: Medicare HMO | Attending: Internal Medicine | Admitting: Internal Medicine

## 2015-08-19 DIAGNOSIS — C7951 Secondary malignant neoplasm of bone: Secondary | ICD-10-CM | POA: Diagnosis present

## 2015-08-19 DIAGNOSIS — R339 Retention of urine, unspecified: Secondary | ICD-10-CM | POA: Diagnosis present

## 2015-08-19 DIAGNOSIS — I248 Other forms of acute ischemic heart disease: Secondary | ICD-10-CM | POA: Diagnosis present

## 2015-08-19 DIAGNOSIS — G9340 Encephalopathy, unspecified: Secondary | ICD-10-CM | POA: Diagnosis present

## 2015-08-19 DIAGNOSIS — R0602 Shortness of breath: Secondary | ICD-10-CM | POA: Diagnosis present

## 2015-08-19 DIAGNOSIS — J441 Chronic obstructive pulmonary disease with (acute) exacerbation: Secondary | ICD-10-CM | POA: Diagnosis present

## 2015-08-19 DIAGNOSIS — Z87891 Personal history of nicotine dependence: Secondary | ICD-10-CM

## 2015-08-19 DIAGNOSIS — J45909 Unspecified asthma, uncomplicated: Secondary | ICD-10-CM | POA: Diagnosis present

## 2015-08-19 DIAGNOSIS — Z880 Allergy status to penicillin: Secondary | ICD-10-CM

## 2015-08-19 DIAGNOSIS — R05 Cough: Secondary | ICD-10-CM | POA: Diagnosis not present

## 2015-08-19 DIAGNOSIS — R7989 Other specified abnormal findings of blood chemistry: Secondary | ICD-10-CM

## 2015-08-19 DIAGNOSIS — N401 Enlarged prostate with lower urinary tract symptoms: Secondary | ICD-10-CM | POA: Diagnosis present

## 2015-08-19 DIAGNOSIS — I10 Essential (primary) hypertension: Secondary | ICD-10-CM | POA: Diagnosis present

## 2015-08-19 DIAGNOSIS — C3490 Malignant neoplasm of unspecified part of unspecified bronchus or lung: Secondary | ICD-10-CM | POA: Diagnosis present

## 2015-08-19 DIAGNOSIS — E785 Hyperlipidemia, unspecified: Secondary | ICD-10-CM | POA: Diagnosis present

## 2015-08-19 DIAGNOSIS — E782 Mixed hyperlipidemia: Secondary | ICD-10-CM | POA: Diagnosis present

## 2015-08-19 DIAGNOSIS — C3412 Malignant neoplasm of upper lobe, left bronchus or lung: Secondary | ICD-10-CM | POA: Diagnosis not present

## 2015-08-19 DIAGNOSIS — K5900 Constipation, unspecified: Secondary | ICD-10-CM | POA: Diagnosis present

## 2015-08-19 DIAGNOSIS — G893 Neoplasm related pain (acute) (chronic): Secondary | ICD-10-CM | POA: Diagnosis present

## 2015-08-19 DIAGNOSIS — F411 Generalized anxiety disorder: Secondary | ICD-10-CM | POA: Diagnosis present

## 2015-08-19 DIAGNOSIS — K219 Gastro-esophageal reflux disease without esophagitis: Secondary | ICD-10-CM | POA: Diagnosis present

## 2015-08-19 DIAGNOSIS — I2510 Atherosclerotic heart disease of native coronary artery without angina pectoris: Secondary | ICD-10-CM | POA: Diagnosis present

## 2015-08-19 DIAGNOSIS — Z79899 Other long term (current) drug therapy: Secondary | ICD-10-CM

## 2015-08-19 DIAGNOSIS — Z9981 Dependence on supplemental oxygen: Secondary | ICD-10-CM

## 2015-08-19 DIAGNOSIS — Z7951 Long term (current) use of inhaled steroids: Secondary | ICD-10-CM | POA: Diagnosis not present

## 2015-08-19 DIAGNOSIS — D649 Anemia, unspecified: Secondary | ICD-10-CM | POA: Diagnosis present

## 2015-08-19 DIAGNOSIS — R778 Other specified abnormalities of plasma proteins: Secondary | ICD-10-CM

## 2015-08-19 DIAGNOSIS — R0902 Hypoxemia: Secondary | ICD-10-CM | POA: Diagnosis present

## 2015-08-19 DIAGNOSIS — A499 Bacterial infection, unspecified: Secondary | ICD-10-CM | POA: Diagnosis not present

## 2015-08-19 DIAGNOSIS — H3530 Unspecified macular degeneration: Secondary | ICD-10-CM | POA: Diagnosis present

## 2015-08-19 LAB — CBC WITH DIFFERENTIAL/PLATELET
Basophils Absolute: 0 10*3/uL (ref 0–0.1)
Eosinophils Absolute: 0.1 10*3/uL (ref 0–0.7)
Eosinophils Relative: 1 %
HEMATOCRIT: 34.3 % — AB (ref 40.0–52.0)
Hemoglobin: 11.4 g/dL — ABNORMAL LOW (ref 13.0–18.0)
LYMPHS ABS: 0.7 10*3/uL — AB (ref 1.0–3.6)
Lymphocytes Relative: 10 %
MCH: 29.8 pg (ref 26.0–34.0)
MCHC: 33.2 g/dL (ref 32.0–36.0)
MCV: 89.8 fL (ref 80.0–100.0)
MONO ABS: 1 10*3/uL (ref 0.2–1.0)
NEUTROS ABS: 5.3 10*3/uL (ref 1.4–6.5)
Neutrophils Relative %: 75 %
Platelets: 194 10*3/uL (ref 150–440)
RBC: 3.82 MIL/uL — ABNORMAL LOW (ref 4.40–5.90)
RDW: 14.7 % — AB (ref 11.5–14.5)
WBC: 7.1 10*3/uL (ref 3.8–10.6)

## 2015-08-19 LAB — BASIC METABOLIC PANEL
ANION GAP: 12 (ref 5–15)
BUN: 14 mg/dL (ref 6–20)
CHLORIDE: 93 mmol/L — AB (ref 101–111)
CO2: 27 mmol/L (ref 22–32)
Calcium: 8.1 mg/dL — ABNORMAL LOW (ref 8.9–10.3)
Creatinine, Ser: 0.9 mg/dL (ref 0.61–1.24)
GFR calc Af Amer: >60 mL/min (ref >60)
GFR calc non Af Amer: >60 mL/min (ref >60)
GLUCOSE: 97 mg/dL (ref 65–99)
POTASSIUM: 3.5 mmol/L (ref 3.5–5.1)
Sodium: 132 mmol/L — ABNORMAL LOW (ref 135–145)

## 2015-08-19 LAB — TROPONIN I
TROPONIN I: 0.04 ng/mL — AB (ref <0.031)
Troponin I: 0.29 ng/mL — ABNORMAL HIGH (ref <0.031)
Troponin I: <0.03 ng/mL (ref <0.031)

## 2015-08-19 LAB — URINALYSIS COMPLETE WITH MICROSCOPIC (ARMC ONLY)
BACTERIA UA: NONE SEEN
Bilirubin Urine: NEGATIVE
Glucose, UA: NEGATIVE mg/dL
KETONES UR: NEGATIVE mg/dL
NITRITE: POSITIVE — AB
PROTEIN: NEGATIVE mg/dL
SPECIFIC GRAVITY, URINE: 1.009 (ref 1.005–1.030)
pH: 6 (ref 5.0–8.0)

## 2015-08-19 LAB — CREATININE, SERUM
CREATININE: 0.77 mg/dL (ref 0.61–1.24)
GFR calc non Af Amer: >60 mL/min (ref >60)

## 2015-08-19 LAB — CBC
HCT: 33 % — ABNORMAL LOW (ref 40.0–52.0)
Hemoglobin: 11.1 g/dL — ABNORMAL LOW (ref 13.0–18.0)
MCH: 30.3 pg (ref 26.0–34.0)
MCHC: 33.5 g/dL (ref 32.0–36.0)
MCV: 90.2 fL (ref 80.0–100.0)
PLATELETS: 173 10*3/uL (ref 150–440)
RBC: 3.65 MIL/uL — ABNORMAL LOW (ref 4.40–5.90)
RDW: 14.7 % — AB (ref 11.5–14.5)
WBC: 7.5 10*3/uL (ref 3.8–10.6)

## 2015-08-19 MED ORDER — SODIUM CHLORIDE 0.9% FLUSH
3.0000 mL | Freq: Two times a day (BID) | INTRAVENOUS | Status: DC
  Administered 2015-08-19 – 2015-08-21 (×4): 3 mL via INTRAVENOUS

## 2015-08-19 MED ORDER — ACETAMINOPHEN 650 MG RE SUPP: 650.0000 mg | Freq: Four times a day (QID) | RECTAL | Status: DC | PRN

## 2015-08-19 MED ORDER — IPRATROPIUM-ALBUTEROL 0.5-2.5 (3) MG/3ML IN SOLN
3.0000 mL | RESPIRATORY_TRACT | Status: DC
  Administered 2015-08-19 – 2015-08-21 (×10): 3 mL via RESPIRATORY_TRACT
  Filled 2015-08-19 (×11): qty 3

## 2015-08-19 MED ORDER — METHYLPREDNISOLONE SODIUM SUCC 125 MG IJ SOLR
60.0000 mg | Freq: Four times a day (QID) | INTRAMUSCULAR | Status: DC
  Administered 2015-08-19 – 2015-08-20 (×4): 60 mg via INTRAVENOUS
  Filled 2015-08-19 (×4): qty 2

## 2015-08-19 MED ORDER — LORAZEPAM 0.5 MG PO TABS
0.5000 mg | ORAL_TABLET | Freq: Three times a day (TID) | ORAL | Status: DC
  Administered 2015-08-19 – 2015-08-20 (×3): 0.5 mg via ORAL
  Filled 2015-08-19 (×3): qty 1

## 2015-08-19 MED ORDER — IOPAMIDOL (ISOVUE-370) INJECTION 76%
75.0000 mL | Freq: Once | INTRAVENOUS | Status: AC | PRN
  Administered 2015-08-19: 75 mL via INTRAVENOUS

## 2015-08-19 MED ORDER — TAMSULOSIN HCL 0.4 MG PO CAPS
0.4000 mg | ORAL_CAPSULE | Freq: Every day | ORAL | Status: DC
  Administered 2015-08-20 – 2015-08-21 (×2): 0.4 mg via ORAL
  Filled 2015-08-19 (×2): qty 1

## 2015-08-19 MED ORDER — MORPHINE SULFATE (PF) 2 MG/ML IV SOLN: 1.0000 mg | INTRAVENOUS | Status: DC | PRN

## 2015-08-19 MED ORDER — ENOXAPARIN SODIUM 40 MG/0.4ML ~~LOC~~ SOLN
40.0000 mg | SUBCUTANEOUS | Status: DC
  Administered 2015-08-19 – 2015-08-20 (×2): 40 mg via SUBCUTANEOUS
  Filled 2015-08-19 (×2): qty 0.4

## 2015-08-19 MED ORDER — ASPIRIN 81 MG PO CHEW
324.0000 mg | CHEWABLE_TABLET | Freq: Once | ORAL | Status: AC
  Administered 2015-08-19: 324 mg via ORAL
  Filled 2015-08-19: qty 4

## 2015-08-19 MED ORDER — HYDROCODONE-ACETAMINOPHEN 5-325 MG PO TABS: 1.0000 | ORAL_TABLET | ORAL | Status: DC | PRN

## 2015-08-19 MED ORDER — GUAIFENESIN ER 600 MG PO TB12
600.0000 mg | ORAL_TABLET | Freq: Two times a day (BID) | ORAL | Status: DC
  Administered 2015-08-19 – 2015-08-21 (×4): 600 mg via ORAL
  Filled 2015-08-19 (×4): qty 1

## 2015-08-19 MED ORDER — SODIUM CHLORIDE 0.9 % IV SOLN: 250.0000 mL | INTRAVENOUS | Status: DC | PRN

## 2015-08-19 MED ORDER — SODIUM CHLORIDE 0.9% FLUSH
3.0000 mL | Freq: Two times a day (BID) | INTRAVENOUS | Status: DC
  Administered 2015-08-19 – 2015-08-21 (×3): 3 mL via INTRAVENOUS

## 2015-08-19 MED ORDER — ZOLPIDEM TARTRATE 5 MG PO TABS
10.0000 mg | ORAL_TABLET | Freq: Every day | ORAL | Status: DC
  Administered 2015-08-19 – 2015-08-20 (×2): 10 mg via ORAL
  Filled 2015-08-19 (×2): qty 2

## 2015-08-19 MED ORDER — ONDANSETRON HCL 4 MG/2ML IJ SOLN: 4.0000 mg | Freq: Four times a day (QID) | INTRAMUSCULAR | Status: DC | PRN

## 2015-08-19 MED ORDER — PANTOPRAZOLE SODIUM 40 MG PO TBEC
40.0000 mg | DELAYED_RELEASE_TABLET | Freq: Every day | ORAL | Status: DC
  Administered 2015-08-19 – 2015-08-21 (×3): 40 mg via ORAL
  Filled 2015-08-19 (×3): qty 1

## 2015-08-19 MED ORDER — TIOTROPIUM BROMIDE MONOHYDRATE 18 MCG IN CAPS
18.0000 ug | ORAL_CAPSULE | Freq: Every day | RESPIRATORY_TRACT | Status: DC
  Administered 2015-08-20 – 2015-08-21 (×2): 18 ug via RESPIRATORY_TRACT
  Filled 2015-08-19: qty 5

## 2015-08-19 MED ORDER — LEVOFLOXACIN IN D5W 500 MG/100ML IV SOLN
500.0000 mg | INTRAVENOUS | Status: DC
  Administered 2015-08-19 – 2015-08-20 (×2): 500 mg via INTRAVENOUS
  Filled 2015-08-19 (×3): qty 100

## 2015-08-19 MED ORDER — ALBUTEROL SULFATE (2.5 MG/3ML) 0.083% IN NEBU: 2.5000 mg | INHALATION_SOLUTION | RESPIRATORY_TRACT | Status: DC | PRN

## 2015-08-19 MED ORDER — FLUTICASONE FUROATE-VILANTEROL 100-25 MCG/INH IN AEPB
1.0000 | INHALATION_SPRAY | Freq: Every day | RESPIRATORY_TRACT | Status: DC
  Administered 2015-08-19 – 2015-08-21 (×3): 1 via RESPIRATORY_TRACT
  Filled 2015-08-19: qty 28

## 2015-08-19 MED ORDER — ATORVASTATIN CALCIUM 20 MG PO TABS
40.0000 mg | ORAL_TABLET | Freq: Every day | ORAL | Status: DC
  Administered 2015-08-19 – 2015-08-20 (×2): 40 mg via ORAL
  Filled 2015-08-19 (×2): qty 2

## 2015-08-19 MED ORDER — POTASSIUM CHLORIDE CRYS ER 20 MEQ PO TBCR
20.0000 meq | EXTENDED_RELEASE_TABLET | Freq: Every day | ORAL | Status: DC
  Administered 2015-08-19 – 2015-08-21 (×3): 20 meq via ORAL
  Filled 2015-08-19 (×3): qty 1

## 2015-08-19 MED ORDER — SODIUM CHLORIDE 0.9% FLUSH: 3.0000 mL | INTRAVENOUS | Status: DC | PRN

## 2015-08-19 MED ORDER — ASPIRIN EC 325 MG PO TBEC
325.0000 mg | DELAYED_RELEASE_TABLET | Freq: Every day | ORAL | Status: DC
  Administered 2015-08-20 – 2015-08-21 (×2): 325 mg via ORAL
  Filled 2015-08-19 (×2): qty 1

## 2015-08-19 MED ORDER — VENLAFAXINE HCL ER 75 MG PO CP24
150.0000 mg | ORAL_CAPSULE | Freq: Every day | ORAL | Status: DC
  Administered 2015-08-20 – 2015-08-21 (×2): 150 mg via ORAL
  Filled 2015-08-19: qty 2
  Filled 2015-08-19: qty 1
  Filled 2015-08-19: qty 2

## 2015-08-19 MED ORDER — ONDANSETRON HCL 4 MG PO TABS: 4.0000 mg | ORAL_TABLET | Freq: Four times a day (QID) | ORAL | Status: DC | PRN

## 2015-08-19 MED ORDER — QUETIAPINE FUMARATE ER 300 MG PO TB24
300.0000 mg | ORAL_TABLET | Freq: Every day | ORAL | Status: DC
  Administered 2015-08-19 – 2015-08-20 (×2): 300 mg via ORAL
  Filled 2015-08-19 (×2): qty 1

## 2015-08-19 MED ORDER — ACETAMINOPHEN 325 MG PO TABS: 650.0000 mg | ORAL_TABLET | Freq: Four times a day (QID) | ORAL | Status: DC | PRN

## 2015-08-19 NOTE — ED Notes (Signed)
Patient states he has been "feeling bad" for about a month.  C/O feeling SOB with cough and lower back pain.  Patient has a history of lung CA. And has had chemo and radiation therapy. Last Chemo was in the beginning of April  Patient wears home oxygen 3L/ [place]  Arrives to ED on RA.

## 2015-08-19 NOTE — ED Notes (Signed)
Patient's sister also reports that patient was having difficulty "getting his words out" on Monday.  Patient denies current symptoms. Moving all extremities equally and strong.  Speech clear.

## 2015-08-19 NOTE — H&P (Signed)
[facility] [place] NAME: Timothy Ali    MR#:  [id number]  DATE OF BIRTH:  April 08, 1930  DATE OF ADMISSION:  08/19/2015  PRIMARY CARE PHYSICIAN: Albina Billet, MD   REQUESTING/REFERRING PHYSICIAN: Nance Pear MD  CHIEF COMPLAINT:   Chief Complaint  Patient presents with  . Shortness of Breath  . Cough    HISTORY OF PRESENT ILLNESS: Timothy Ali  is a 82 y.o. male with a known history of Lung cancer currently followed by oncology who reports that he had shortness of breath for the past 30-35 days. He is chronically on oxygen at 3 L. However today his shortness of breath got worse. And he decided to come to the emergency room. He complains of productive cough as well as some wheezing intermittently. No fevers or chills. Patient has had intermittent left-sided chest pain as well. He denies any nausea vomiting or diarrhea. CT scan showed concern for progression of metastatic lung cancer as well as some infectious and inflammatory process. PAST MEDICAL HISTORY:   Past Medical History  Diagnosis Date  . Major depressive disorder, recurrent, severe with psychotic features ([place])   . Persistent disorder of initiating or maintaining sleep   . Generalized anxiety disorder   . Major depressive disorder, recurrent, severe with psychotic features ([place])   . Persistent disorder of initiating or maintaining sleep   . Asthma   . Cancer ([place])     Left lung  . COPD (chronic obstructive pulmonary disease) ([place])     PAST SURGICAL HISTORY: Past Surgical History  Procedure Laterality Date  . Hemorroidectomy    . Lung surgery      SOCIAL HISTORY:  Social History  Substance Use Topics  . Smoking status: Former Research scientist (life sciences)  . Smokeless tobacco: Never Used     Comment: quit 18 years ago  . Alcohol Use: No    FAMILY HISTORY:  Family History  Problem Relation Age of Onset  . COPD Mother   . Lung cancer Father   . Kidney disease Neg Hx   .  Prostate cancer Neg Hx     DRUG ALLERGIES:  Allergies  Allergen Reactions  . Penicillins Other (See Comments)    Reaction:  Unknown     REVIEW OF SYSTEMS:   CONSTITUTIONAL: No fever, fatigue or weakness.  EYES: No blurred or double vision.  EARS, NOSE, AND THROAT: No tinnitus or ear pain.  RESPIRATORY: Positive cough, positive shortness of breath, no wheezing or hemoptysis.  CARDIOVASCULAR: Intermittent chest pain, orthopnea, edema.  GASTROINTESTINAL: No nausea, vomiting, diarrhea or abdominal pain.  GENITOURINARY: No dysuria, hematuria.  ENDOCRINE: No polyuria, nocturia,  HEMATOLOGY: No anemia, easy bruising or bleeding SKIN: No rash or lesion. MUSCULOSKELETAL: No joint pain or arthritis.   NEUROLOGIC: No tingling, numbness, weakness.  PSYCHIATRY: No anxiety or depression.   MEDICATIONS AT HOME:  Prior to Admission medications   Medication Sig Start Date End Date Taking? Authorizing Provider  albuterol (PROVENTIL HFA;VENTOLIN HFA) 108 (90 BASE) MCG/ACT inhaler Inhale 2 puffs into the lungs every 4 (four) hours as needed for wheezing or shortness of breath.    Yes Historical Provider, MD  atorvastatin (LIPITOR) 40 MG tablet Take 40 mg by mouth at bedtime.    Yes Historical Provider, MD  BREO ELLIPTA 100-25 MCG/INH AEPB Inhale 1 puff into the lungs daily.    Yes Historical Provider, MD  esomeprazole (NEXIUM) 40 MG capsule Take 40 mg by mouth daily.  Yes Historical Provider, MD  LORazepam (ATIVAN) 0.5 MG tablet Take 0.5 mg by mouth every 8 (eight) hours. Pt takes with a '1mg'$  tablet.   Yes Historical Provider, MD  LORazepam (ATIVAN) 1 MG tablet Take 1 mg by mouth every 8 (eight) hours. Pt takes with a 0.'5mg'$  tablet.   Yes Historical Provider, MD  potassium chloride SA (K-DUR,KLOR-CON) 20 MEQ tablet Take 1 tablet (20 mEq total) by mouth daily. 05/14/15  Yes Forest Gleason, MD  QUEtiapine (SEROQUEL XR) 300 MG 24 hr tablet Take 1 tablet (300 mg total) by mouth at bedtime. 06/29/15  Yes  Himabindu Ravi, MD  tamsulosin (FLOMAX) 0.4 MG CAPS capsule Take 0.4 mg by mouth daily after breakfast.    Yes Historical Provider, MD  tiotropium (SPIRIVA) 18 MCG inhalation capsule Place 18 mcg into inhaler and inhale daily.    Yes Historical Provider, MD  venlafaxine XR (EFFEXOR-XR) 150 MG 24 hr capsule Take 1 capsule (150 mg total) by mouth daily. 06/29/15  Yes Himabindu Ravi, MD  zolpidem (AMBIEN) 10 MG tablet Take 1 tablet (10 mg total) by mouth at bedtime. 04/07/15  Yes Marjie Skiff, MD      PHYSICAL EXAMINATION:   VITAL SIGNS: Blood pressure 115/45, pulse 102, temperature 98.1 F (36.7 C), temperature source Oral, resp. rate 30, height '5\' 7"'$  (1.702 m), weight 59.421 kg (131 lb), SpO2 94 %.  GENERAL:  82 y.o.-year-old patient lying in the bed with no acute distress.  EYES: Pupils equal, round, reactive to light and accommodation. No scleral icterus. Extraocular muscles intact.  HEENT: Head atraumatic, normocephalic. Oropharynx and nasopharynx clear.  NECK:  Supple, no jugular venous distention. No thyroid enlargement, no tenderness.  LUNGS: Diminished breath sounds bilaterally, no wheezing, rales,rhonchi or crepitation. No use of accessory muscles of respiration.  CARDIOVASCULAR: S1, S2 normal. No murmurs, rubs, or gallops.  ABDOMEN: Soft, nontender, nondistended. Bowel sounds present. No organomegaly or mass.  EXTREMITIES: No pedal edema, cyanosis, or clubbing.  NEUROLOGIC: Cranial nerves II through XII are intact. Muscle strength 5/5 in all extremities. Sensation intact. Gait not checked.  PSYCHIATRIC: The patient is alert and oriented x 3.  SKIN: No obvious rash, lesion, or ulcer.   LABORATORY PANEL:   CBC  Recent Labs Lab 08/19/15 1155  WBC 7.1  HGB 11.4*  HCT 34.3*  PLT 194  MCV 89.8  MCH 29.8  MCHC 33.2  RDW 14.7*  LYMPHSABS 0.7*  MONOABS 1.0  EOSABS 0.1  BASOSABS 0.0    ------------------------------------------------------------------------------------------------------------------  Chemistries   Recent Labs Lab 08/19/15 1155  NA 132*  K 3.5  CL 93*  CO2 27  GLUCOSE 97  BUN 14  CREATININE 0.90  CALCIUM 8.1*   ------------------------------------------------------------------------------------------------------------------ estimated creatinine clearance is 51.3 mL/min (by C-G formula based on Cr of 0.9). ------------------------------------------------------------------------------------------------------------------ No results for input(s): TSH, T4TOTAL, T3FREE, THYROIDAB in the last 72 hours.  Invalid input(s): FREET3   Coagulation profile No results for input(s): INR, PROTIME in the last 168 hours. ------------------------------------------------------------------------------------------------------------------- No results for input(s): DDIMER in the last 72 hours. -------------------------------------------------------------------------------------------------------------------  Cardiac Enzymes  Recent Labs Lab 08/19/15 1155  TROPONINI 0.29*   ------------------------------------------------------------------------------------------------------------------ Invalid input(s): POCBNP  ---------------------------------------------------------------------------------------------------------------  Urinalysis    Component Value Date/Time   COLORURINE YELLOW* 01/24/2015 0228   COLORURINE Straw 04/09/2014 1458   APPEARANCEUR Clear 02/11/2015 1018   APPEARANCEUR HAZY* 01/24/2015 0228   APPEARANCEUR Clear 04/09/2014 1458   LABSPEC 1.012 01/24/2015 0228   LABSPEC 1.006 04/09/2014 1458   PHURINE 5.0 01/24/2015 0228  PHURINE 6.0 04/09/2014 1458   GLUCOSEU Negative 02/11/2015 1018   GLUCOSEU Negative 04/09/2014 1458   HGBUR 2+* 01/24/2015 0228   HGBUR 1+ 04/09/2014 1458   BILIRUBINUR Negative 02/11/2015 1018   BILIRUBINUR  NEGATIVE 01/24/2015 0228   BILIRUBINUR Negative 04/09/2014 1458   KETONESUR NEGATIVE 01/24/2015 0228   KETONESUR Negative 04/09/2014 1458   PROTEINUR Negative 02/11/2015 1018   PROTEINUR NEGATIVE 01/24/2015 0228   PROTEINUR Negative 04/09/2014 1458   NITRITE Negative 02/11/2015 1018   NITRITE NEGATIVE 01/24/2015 0228   NITRITE Negative 04/09/2014 1458   LEUKOCYTESUR 1+* 02/11/2015 1018   LEUKOCYTESUR 3+* 01/24/2015 0228   LEUKOCYTESUR Negative 04/09/2014 1458     RADIOLOGY: Dg Chest 2 View  08/19/2015  CLINICAL DATA:  Sibling reports patient having difficulty getting words out of mouth EXAM: CHEST  2 VIEW COMPARISON:  02/26/2015 FINDINGS: Normal heart size. There is no pleural effusion or edema identified. Chronic lung disease is identified bilaterally. Lateral left lung base opacity and left upper lobe perihilar opacity are identified which may be related to known lung cancer. The right lung is clear. IMPRESSION: 1. Left lung opacities are likely related to patient's known lung cancer and/or treatment related changes. 2. No acute findings identified Electronically Signed   By: Kerby Moors M.D.   On: 08/19/2015 12:46   Ct Head Wo Contrast  08/19/2015  CLINICAL DATA:  Slurred speech and headache; lung carcinoma EXAM: CT HEAD WITHOUT CONTRAST TECHNIQUE: Contiguous axial images were obtained from the base of the skull through the vertex without intravenous contrast. COMPARISON:  None. FINDINGS: There is mild diffuse atrophy. There is no intracranial mass, hemorrhage, extra-axial fluid collection, or midline shift. There is mild small vessel disease in the centra semiovale bilaterally. Elsewhere gray-white compartments appear normal. No acute infarct is evident. The bony calvarium appears intact although bones are osteoporotic. Mastoid air cells are clear. There is debris in each external auditory canal. No intraorbital lesions are evident in the visualized orbital regions. IMPRESSION: Mild atrophy  with mild patchy periventricular small vessel disease. No intracranial mass, hemorrhage, or evidence of acute infarct. No intracranial edema. Probable cerumen in each external auditory canal. Electronically Signed   By: Lowella Grip III M.D.   On: 08/19/2015 12:52   Ct Angio Chest Pe W/cm &/or Wo Cm  08/19/2015  CLINICAL DATA:  Shortness of breath and feeling bad. History of lung cancer. EXAM: CT ANGIOGRAPHY CHEST WITH CONTRAST TECHNIQUE: Multidetector CT imaging of the chest was performed using the standard protocol during bolus administration of intravenous contrast. Multiplanar CT image reconstructions and MIPs were obtained to evaluate the vascular anatomy. CONTRAST:  75 mL Isovue 370 COMPARISON:  PET-CT 08/05/2015 FINDINGS: Mediastinum/Lymph Nodes: No evidence to suggest a pulmonary embolism. Limited evaluation of some of the upper pulmonary arteries due to motion artifact. Coronary artery calcifications. No significant pericardial effusion. Subcarinal tissue measures 1.5 cm in the short axis and previously measured 0.9 cm. No significant axillary lymphadenopathy. Lungs/Pleura: The trachea and mainstem bronchi are patent. There is chronic scarring and volume loss in the left upper lobe along the left major fissure. Diffuse emphysematous changes particularly in the left lower lobe. Again noted is a irregular pleural-based lesion in left lower lobe on sequence 6, image 114. Difficult to accurately measure this area due to adjacent parenchymal densities but lesion roughly measures 2.6 x 1.6 cm and previously measured 2.6 x 1.1 cm. Again noted is a pleural-based nodule in the posterior left upper lobe that previously measured 0.8  x 0.7 cm and now measures 0.9 x 1.0 cm. There are adjacent parenchymal densities in this area. There are increased parenchymal densities and interstitial thickening in the left upper lobe near the apex. New linear densities extending from the anterior chest may represent areas of  atelectasis. Again noted are patchy pleural-based densities in the medial right lower lobe probably representing scarring. Upper abdomen: Images upper abdomen have some limitations due to motion artifact. No acute abnormalities in the upper abdomen. Musculoskeletal: Again noted is a soft tissue lesion involving the posterior left fourth rib. Difficult to identify the margins of this tumor but roughly measures 3.6 x 1.8 cm and minimally changed from the recent comparison examination. There is adjacent pleural thickening in this region. No clear evidence for a new bone lesion. Multilevel degenerative changes in the thoracic spine. Review of the MIP images confirms the above findings. IMPRESSION: No evidence for pulmonary embolism. Study has some technical limitations due to motion artifact as described. Concern for progression of the metastatic lung disease demonstrated by slight enlargement of the pleural-based left lower lobe lesions and enlarging subcarinal lymph node. Again noted is a soft tissue mass involving the left fourth rib and posterior chest wall. Severe emphysematous disease with increased parenchymal densities or interstitial thickening at the left lung apex. This could represent an infectious or inflammatory process. There are additional patchy parenchymal densities in the superior segment in the left lower lobe which could be inflammatory as well. Electronically Signed   By: Markus Daft M.D.   On: 08/19/2015 15:20    EKG: Orders placed or performed during the hospital encounter of 08/19/15  . EKG 12-Lead  . EKG 12-Lead    IMPRESSION AND PLAN: Patient is a 82 year old white male presents with shortness of breath with lung cancer  1. Shortness of breath suspect due to worsening metastatic lung cancer. He also has inflammatory changes noted on the CT We will treat him with antibiotics nebulizers steroids I will ask pulmonary to see him I will ask his oncologist to see him  2. COPD with  acute exasperation Antibiotics as above and steroids  3. Generalized anxiety disorder continue Ativan as taking at home  4. Elevated troponin suspected due to demand ischemia I will check serial enzymes I will ask cardiology to see Echocardiogram of the heart  5. Hyperlipidemia unspecified continue atorvastatin  6. GERD continue Nexium  7. BPH continue Flomax  8. Miscellaneous Lovenox for DVT prophylaxis All the records are reviewed and case discussed with ED provider. Management plans discussed with the patient, family and they are in agreement.  CODE STATUS:    Code Status Orders        Start     Ordered   08/19/15 1601  Full code   Continuous     08/19/15 1601    Code Status History    Date Active Date Inactive Code Status Order ID Comments User Context   10/24/2014 12:48 AM 10/25/2014  5:18 PM Full Code [id number]  Juluis Mire, MD Inpatient       TOTAL TIME TAKING CARE OF THIS PATIENT: 55 minutes.    Dustin Flock M.D on 08/19/2015 at 4:06 PM  Between 7am to 6pm - Pager - [phone]  After 6pm go to [email or web address] - password [email or web address] [facility]  Office  [phone]  CC: Primary care physician; Albina Billet, MD

## 2015-08-19 NOTE — ED Provider Notes (Signed)
[facility] Emergency Department Provider Note   ____________________________________________  Time seen: ~1140  I have reviewed the triage vital signs and the nursing notes.   HISTORY  Chief Complaint Shortness of Breath and Cough   History limited by: Not Limited   HPI Timothy Ali is a 82 y.o. male who presents to the emergency department today with primary complaint of shortness breath and cough. The patient does have a history of lung cancer so has a somewhat chronic issue with shortness of breath. He does state however for the past 4-5 days has been worse. That he has had associated productive cough. It is productive of gray phlegm. He has not noticed any blood. He denies any new or different chest pain with the recent increase in shortness of breath. He denies any fevers.  The sister in the room however added that the patient had an episode on Monday of slurred speech. The patient apparently was not aware of it at that time. It was noticed when he was talking on the phone. They also described some facial droop with this. They did not seek medical treatment at that time. They state that his speech is back to normal now.   Past Medical History  Diagnosis Date  . Major depressive disorder, recurrent, severe with psychotic features ([place])   . Persistent disorder of initiating or maintaining sleep   . Generalized anxiety disorder   . Major depressive disorder, recurrent, severe with psychotic features ([place])   . Persistent disorder of initiating or maintaining sleep   . Asthma   . Cancer ([place])     Left lung  . COPD (chronic obstructive pulmonary disease) [facility])     Patient Active Problem List   Diagnosis Date Noted  . BPH (benign prostatic hypertrophy) with urinary retention 02/16/2015  . Metastatic lung cancer (metastasis from lung to other site) ([place]) 02/16/2015  . Lung cancer ([place]) 10/24/2014  . Acute on chronic respiratory failure ([place])  10/23/2014  . Leucocytosis 10/23/2014  . COPD (chronic obstructive pulmonary disease) ([place]) 10/23/2014  . Carcinoma, lung ([place]) 10/23/2014  . Cancer of lung ([place]) 10/23/2014  . CAFL (chronic airflow limitation) ([place]) 08/01/2014  . Depression, major, recurrent, severe with psychosis ([place]) 08/01/2014  . H/O gastrointestinal disease 08/01/2014  . Anxiety, generalized 08/01/2014  . H/O elevated lipids 08/01/2014  . Insomnia, persistent 08/01/2014  . H/O malignant neoplasm 08/01/2014  . Prostate disease 08/01/2014  . Macular degeneration 08/01/2014  . Obstruction of urinary tract 08/01/2014  . Chronic obstructive pulmonary disease ([place]) 08/01/2014  . Severe episode of recurrent major depressive disorder, with psychotic features ([place]) 08/01/2014    Past Surgical History  Procedure Laterality Date  . Hemorroidectomy    . Lung surgery      Current Outpatient Rx  Name  Route  Sig  Dispense  Refill  . albuterol (PROVENTIL HFA;VENTOLIN HFA) 108 (90 BASE) MCG/ACT inhaler   Inhalation   Inhale 1-2 puffs into the lungs QID.          Marland Kitchen atorvastatin (LIPITOR) 40 MG tablet   Oral   Take 1 tablet by mouth daily.         Marland Kitchen BREO ELLIPTA 100-25 MCG/INH AEPB                 Dispense as written.   Marland Kitchen econazole nitrate 1 % cream   Topical   Apply 1 application topically 2 (two) times daily.          Marland Kitchen  esomeprazole (NEXIUM) 40 MG capsule   Oral   Take 40 mg by mouth daily at 12 noon.         . finasteride (PROSCAR) 5 MG tablet   Oral   Take 1 tablet (5 mg total) by mouth daily.   30 tablet   12   . HYDROcodone-acetaminophen (NORCO/VICODIN) 5-325 MG per tablet   Oral   Take 1 tablet by mouth every 4 (four) hours as needed for moderate pain.   30 tablet   0   . LORazepam (ATIVAN) 0.5 MG tablet   Oral   Take 1 tablet (0.5 mg total) by mouth every 6 (six) hours as needed for anxiety.   90 tablet   0   . magnesium citrate SOLN   Oral   Take 296 mLs (1 Bottle total) by  mouth once.   195 mL   2   . mometasone-formoterol (DULERA) 200-5 MCG/ACT AERO   Inhalation   Inhale 2 puffs into the lungs 2 (two) times daily.          Marland Kitchen nystatin cream (MYCOSTATIN)   Topical   Apply 1 application topically 2 (two) times daily.   30 g   0     Cancel rx for xolegel 2%   . Polyethylene Glycol 3350 GRAN               . potassium chloride SA (K-DUR,KLOR-CON) 20 MEQ tablet   Oral   Take 1 tablet (20 mEq total) by mouth daily.   30 tablet   3   . QUEtiapine (SEROQUEL XR) 300 MG 24 hr tablet   Oral   Take 1 tablet (300 mg total) by mouth at bedtime.   90 tablet   1     HOLD FILLING UNTIL PATIENT CALLS FOR REFILL.   Marland Kitchen tamsulosin (FLOMAX) 0.4 MG CAPS capsule   Oral   Take 1 capsule by mouth daily.         Marland Kitchen tiotropium (SPIRIVA) 18 MCG inhalation capsule   Inhalation   Place into inhaler and inhale daily.         Marland Kitchen triamcinolone cream (KENALOG) 0.1 %               . venlafaxine XR (EFFEXOR-XR) 150 MG 24 hr capsule   Oral   Take 1 capsule (150 mg total) by mouth daily.   90 capsule   1     HOLD FILLING UNTIL PATIENT CALLS FOR REFILL.   Marland Kitchen zolpidem (AMBIEN) 10 MG tablet   Oral   Take 1 tablet (10 mg total) by mouth at bedtime.   90 tablet   1     Allergies Penicillins  Family History  Problem Relation Age of Onset  . COPD Mother   . Lung cancer Father   . Kidney disease Neg Hx   . Prostate cancer Neg Hx     Social History Social History  Substance Use Topics  . Smoking status: Former Research scientist (life sciences)  . Smokeless tobacco: Never Used     Comment: quit 18 years ago  . Alcohol Use: No    Review of Systems  Constitutional: Negative for fever. Cardiovascular: Negative for chest pain. Respiratory: Positive for shortness of breath. Gastrointestinal: Negative for abdominal pain, vomiting and diarrhea. Neurological: Negative for headaches, focal weakness or numbness.  10-point ROS otherwise  negative.  ____________________________________________   PHYSICAL EXAM:  VITAL SIGNS: ED Triage Vitals  Enc Vitals Group     BP 08/19/15  1122 145/85 mmHg     Pulse Rate 08/19/15 1122 105     Resp 08/19/15 1122 24     Temp 08/19/15 1122 98.1 F (36.7 C)     Temp Source 08/19/15 1122 Oral     SpO2 08/19/15 1122 92 %     Weight 08/19/15 1122 131 lb (59.421 kg)     Height 08/19/15 1122 5' 7" (1.702 m)     Head Cir --      Peak Flow --      Pain Score 08/19/15 1123 0   Constitutional: Alert and oriented. Somewhat chronically ill-appearing Eyes: Conjunctivae are normal. PERRL. Normal extraocular movements. ENT   Head: Normocephalic and atraumatic.   Nose: No congestion/rhinnorhea.   Mouth/Throat: Mucous membranes are moist.   Neck: No stridor. Hematological/Lymphatic/Immunilogical: No cervical lymphadenopathy. Cardiovascular: Tachycardic.  No murmurs, rubs, or gallops. Respiratory: Tachypnea, decreased breath sounds in the left lung. Gastrointestinal: Soft and nontender. No distention.  Genitourinary: Deferred Musculoskeletal: Normal range of motion in all extremities. No joint effusions.  No lower extremity tenderness nor edema. Neurologic:  Normal speech and language. No gross focal neurologic deficits are appreciated.  Skin:  Skin is warm, dry and intact. No rash noted. Psychiatric: Mood and affect are normal. Speech and behavior are normal. Patient exhibits appropriate insight and judgment.  ____________________________________________    LABS (pertinent positives/negatives)  Labs Reviewed  CBC WITH DIFFERENTIAL/PLATELET - Abnormal; Notable for the following:    RBC 3.82 (*)    Hemoglobin 11.4 (*)    HCT 34.3 (*)    RDW 14.7 (*)    Lymphs Abs 0.7 (*)    All other components within normal limits  BASIC METABOLIC PANEL - Abnormal; Notable for the following:    Sodium 132 (*)    Chloride 93 (*)    Calcium 8.1 (*)    All other components within normal  limits  TROPONIN I - Abnormal; Notable for the following:    Troponin I 0.29 (*)    All other components within normal limits     ____________________________________________   EKG  I, Nance Pear, attending physician, personally viewed and interpreted this EKG  EKG Time: 1134 Rate: 101 Rhythm: sinus tachycardia Axis: normal Intervals: qtc 355 QRS: narrow ST changes: no st elevation Impression: abnormal EKG  Read is limited by artifact  ____________________________________________    RADIOLOGY  CT head IMPRESSION: Mild atrophy with mild patchy periventricular small vessel disease. No intracranial mass, hemorrhage, or evidence of acute infarct. No intracranial edema. Probable cerumen in each external auditory Canal.  CXR IMPRESSION: 1. Left lung opacities are likely related to patient's known lung cancer and/or treatment related changes. 2. No acute findings identified  CT angio IMPRESSION: No evidence for pulmonary embolism. Study has some technical limitations due to motion artifact as described.  Concern for progression of the metastatic lung disease demonstrated by slight enlargement of the pleural-based left lower lobe lesions and enlarging subcarinal lymph node. Again noted is a soft tissue mass involving the left fourth rib and posterior chest wall.  Severe emphysematous disease with increased parenchymal densities or interstitial thickening at the left lung apex. This could represent an infectious or inflammatory process. There are additional patchy parenchymal densities in the superior segment in the left lower lobe which could be inflammatory as well. ____________________________________________   PROCEDURES  Procedure(s) performed: None  Critical Care performed: Yes, see critical care note(s)  CRITICAL CARE Performed by: Nance Pear   Total critical care time: 49  minutes  Critical care time was exclusive of separately  billable procedures and treating other patients.  Critical care was necessary to treat or prevent imminent or life-threatening deterioration.  Critical care was time spent personally by me on the following activities: development of treatment plan with patient and/or surrogate as well as nursing, discussions with consultants, evaluation of patient's response to treatment, examination of patient, obtaining history from patient or surrogate, ordering and performing treatments and interventions, ordering and review of laboratory studies, ordering and review of radiographic studies, pulse oximetry and re-evaluation of patient's condition.  ____________________________________________   INITIAL IMPRESSION / ASSESSMENT AND PLAN / ED COURSE  Pertinent labs & imaging results that were available during my care of the patient were reviewed by me and considered in my medical decision making (see chart for details).  Patient with history of lung cancer who presents to the emergency department today with primary complaint of increased shortness of breath. Patient is somewhat tachycardic and tachypnea. Somewhat decreased breath sounds on the left side. Would consider pneumonia, pleural effusion high on the list. Would also consider possible pulmonary embolism and no other cause is found. Furthermore it sounds like patient likely had a TIA on Monday. Patient does state he has a known intracranial met. Will get head CT.  ----------------------------------------- 2:26 PM on 08/19/2015 -----------------------------------------  Chest x-ray and head CT without acute findings. Blood work did return with an elevated troponin. Given history of cancer at some concern that this could represent heart strain from a pulmonary embolism. Because of this I will get a CT angiogram. Will order patient aspirin.  ----------------------------------------- 3:42 PM on 08/19/2015 -----------------------------------------  CT  angiogram did not show any signs pulmonary embolism. There was some questionable inflammation versus infiltrate. Given lack of fever or white blood cells I doubt pneumonia at this time. The patient has been admitted to the hospitalist service for further workup and evaluation.  ____________________________________________   FINAL CLINICAL IMPRESSION(S) / ED DIAGNOSES  Final diagnoses:  Elevated troponin     Nance Pear, MD 08/19/15 1542

## 2015-08-19 NOTE — Progress Notes (Signed)
Timothy Ali is a 82 y.o. male patient admitted from ED awake, alert - oriented  X 4 - no acute distress noted.  VSS - Blood pressure 161/73, pulse 106, temperature 97.6 F (36.4 C), temperature source Oral, resp. rate 22, height '5\' 7"'$  (1.702 m), weight 59.421 kg (131 lb), SpO2 85 %.    IV in place, occlusive dsg intact without redness.  Orientation to room, and floor completed with information packet given to patient/family.Admission INP armband ID verified with patient/family, and in place.   SR up x 2, fall assessment complete, with patient and family able to verbalize understanding of risk associated with falls, and verbalized understanding to call nsg before up out of bed.  Call light within reach, patient able to voice, and demonstrate understanding.  Skin, clean-dry- intact without evidence of bruising, or skin tears.   No evidence of skin break down noted on exam. Skin assessed with Caryl Pina, RN. Tele box verified with Prince Solian, RN and [place], NT.      Will cont to eval and treat per MD orders.  Horton Finer, RN 08/19/2015 7:07 PM

## 2015-08-19 NOTE — Progress Notes (Signed)
Bladder scan done patient had greater than 999 in bladder. In and out Cath done 900 out.

## 2015-08-19 NOTE — Progress Notes (Signed)
Per patient he does an in and out cath at least 5 times a day. Patient is c/o of having to void. Notified MD. Per Dr. Posey Pronto okay to in and out cath as needed. Will continue to monitor patient Timothy Ali

## 2015-08-19 NOTE — ED Notes (Signed)
Patient transported to CT 

## 2015-08-20 ENCOUNTER — Inpatient Hospital Stay: Payer: Medicare HMO

## 2015-08-20 DIAGNOSIS — Z9221 Personal history of antineoplastic chemotherapy: Secondary | ICD-10-CM

## 2015-08-20 DIAGNOSIS — Z79899 Other long term (current) drug therapy: Secondary | ICD-10-CM

## 2015-08-20 DIAGNOSIS — R05 Cough: Secondary | ICD-10-CM

## 2015-08-20 DIAGNOSIS — R0602 Shortness of breath: Secondary | ICD-10-CM

## 2015-08-20 DIAGNOSIS — Z923 Personal history of irradiation: Secondary | ICD-10-CM

## 2015-08-20 DIAGNOSIS — A499 Bacterial infection, unspecified: Secondary | ICD-10-CM

## 2015-08-20 DIAGNOSIS — C3412 Malignant neoplasm of upper lobe, left bronchus or lung: Secondary | ICD-10-CM

## 2015-08-20 LAB — ECHOCARDIOGRAM COMPLETE
Height: 67 in
Weight: 2096 oz

## 2015-08-20 LAB — BASIC METABOLIC PANEL
ANION GAP: 8 (ref 5–15)
BUN: 14 mg/dL (ref 6–20)
CHLORIDE: 102 mmol/L (ref 101–111)
CO2: 29 mmol/L (ref 22–32)
Calcium: 8.1 mg/dL — ABNORMAL LOW (ref 8.9–10.3)
Creatinine, Ser: 0.77 mg/dL (ref 0.61–1.24)
GFR calc Af Amer: >60 mL/min (ref >60)
GFR calc non Af Amer: >60 mL/min (ref >60)
GLUCOSE: 175 mg/dL — AB (ref 65–99)
POTASSIUM: 3.8 mmol/L (ref 3.5–5.1)
Sodium: 139 mmol/L (ref 135–145)

## 2015-08-20 LAB — CBC
HCT: 30.5 % — ABNORMAL LOW (ref 40.0–52.0)
HEMOGLOBIN: 10.5 g/dL — AB (ref 13.0–18.0)
MCH: 30.2 pg (ref 26.0–34.0)
MCHC: 34.3 g/dL (ref 32.0–36.0)
MCV: 87.9 fL (ref 80.0–100.0)
Platelets: 163 10*3/uL (ref 150–440)
RBC: 3.47 MIL/uL — AB (ref 4.40–5.90)
RDW: 14.4 % (ref 11.5–14.5)
WBC: 5.3 10*3/uL (ref 3.8–10.6)

## 2015-08-20 LAB — TROPONIN I: Troponin I: <0.03 ng/mL (ref <0.031)

## 2015-08-20 MED ORDER — LACTULOSE 10 GM/15ML PO SOLN
10.0000 g | Freq: Every day | ORAL | Status: DC
  Administered 2015-08-20 – 2015-08-21 (×2): 10 g via ORAL
  Filled 2015-08-20 (×2): qty 30

## 2015-08-20 MED ORDER — SENNOSIDES-DOCUSATE SODIUM 8.6-50 MG PO TABS
1.0000 | ORAL_TABLET | Freq: Two times a day (BID) | ORAL | Status: AC
  Administered 2015-08-20 – 2015-08-21 (×2): 1 via ORAL
  Filled 2015-08-20 (×2): qty 1

## 2015-08-20 MED ORDER — METHYLPREDNISOLONE SODIUM SUCC 40 MG IJ SOLR
40.0000 mg | Freq: Two times a day (BID) | INTRAMUSCULAR | Status: DC
  Administered 2015-08-20: 40 mg via INTRAVENOUS
  Filled 2015-08-20: qty 1

## 2015-08-20 MED ORDER — LORAZEPAM 0.5 MG PO TABS
0.5000 mg | ORAL_TABLET | Freq: Three times a day (TID) | ORAL | Status: DC
  Administered 2015-08-20 – 2015-08-21 (×2): 0.5 mg via ORAL
  Filled 2015-08-20 (×2): qty 1

## 2015-08-20 MED ORDER — LORAZEPAM 2 MG/ML IJ SOLN: 0.5000 mg | Freq: Once | INTRAMUSCULAR | Status: DC

## 2015-08-20 MED ORDER — GADOBENATE DIMEGLUMINE 529 MG/ML IV SOLN
20.0000 mL | Freq: Once | INTRAVENOUS | Status: AC | PRN
  Administered 2015-08-20: 12 mL via INTRAVENOUS

## 2015-08-20 NOTE — Progress Notes (Signed)
A & O. NSR. 3 L of oxygen. Takes meds ok. In and out cath. Pt reports no pain. MRI brain was neg. Sister at the bedside. Pt has no further concerns at this time.

## 2015-08-20 NOTE — Progress Notes (Signed)
[place] @ [facility] [phone]  [phone]   [facility] consult  Timothy Ali OB: December 05, 1929  MR#: [id number]  AOZ#:[id number]  Patient Care Team: Albina Billet, MD as PCP - General (Internal Medicine)  CHIEF COMPLAINT:  Chief Complaint  Patient presents with  . Shortness of Breath  . Cough    Oncology History   1.  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 Further staging workup with PET scan pending (diagnosis based on  CT scan ) July, 2016 2. SB RT for a T1 lesion of the left lung superior segment left upper lobe Biopsy was consistent with non-small cell carcinoma of lung in February of 2015 3.  Started radiation and chemotherapy for left upper lobe recurrent disease versus second primary from November 10, 2014   4.  Patient has finished total 6 weekly cycle of carboplatinum and Taxol on December 15, 2014 Will   Be finished   With radiation therapy by December 21, 2014 5.  Persistent disease and persistent pain.  Patient has been started on TECENTRIQ from March 05, 2015 6.TECENTRIQ  Has been discontinued in April of 2017 because of progressing disease based on PET scan       INTERVAL HISTORY: 29 year old gentleman was started on chemotherapy with platinum and Taxol starting from July of [address]  [age]-year-old gentleman was admitted in the hospital with increasing cough, yellowish expectoration, and increasing shortness of breath.  Family reports that he has been confused sometime (also taking lorazepam 1 mg 4 times a day) Along with Ambien .he lives by himself Last time and patient was seen by me had a progressive disease on   TECENTRIQ I was asked to evaluate patient.  Patient has undergone CT scan without contrast as well as MRI scan and CT scan of chest to rule out any pulmonary embolism which was negative for any PE MRI scan and CT scan of the head has been reviewed there is no evidence of metastases   Planes of constipation  according to family did not have a bowel movement for last 6 days  PERFORMANCE STATUS (ECOG):01 Gen. status: Declining performance status. Patient is poor historian Lungs: Increasing cough.  Increasing shortness of breath.  Yellowish expectoration. Cardiac:  No chest pain, palpitations, orthopnea, or PND. GI:  No nausea, vomiting, diarrhea, C/O   constipation as described about GU:  No urgency, frequency, dysuria, or hematuria. Musculoskeletal:  Left shoulder pain Extremities:  No pain or swelling. Skin: Rash in the left upper extremity left lower extremity Neuro:  No headache, numbness or weakness, balance or coordination issues. Endocrine:  No diabetes, thyroid issues, hot flashes or night sweats. Psych:  No mood changes, depression or anxiety. Pain:  No focal pain. Review of systems:  All other systems reviewed and found to be negative. As per HPI. Otherwise, a complete review of systems is negatve.  PAST MEDICAL HISTORY: Past Medical History  Diagnosis Date  . Major depressive disorder, recurrent, severe with psychotic features ([place])   . Persistent disorder of initiating or maintaining sleep   . Generalized anxiety disorder   . Major depressive disorder, recurrent, severe with psychotic features ([place])   . Persistent disorder of initiating or maintaining sleep   . Asthma   . Cancer ([place])     Left lung  . COPD (chronic obstructive pulmonary disease) ([place])     PAST SURGICAL HISTORY: Past Surgical History  Procedure Laterality Date  . Hemorroidectomy    . Lung  surgery      FAMILY HISTORY Family History  Problem Relation Age of Onset  . COPD Mother   . Lung cancer Father   . Kidney disease Neg Hx   . Prostate cancer Neg Hx     ADVANCED DIRECTIVES:  Patient does have advance healthcare directive, Patient   does not desire to make any changes HEALTH MAINTENANCE: Social History  Substance Use Topics  . Smoking status: Former Research scientist (life sciences)  . Smokeless tobacco: Never Used      Comment: quit 18 years ago  . Alcohol Use: No      Allergies  Allergen Reactions  . Penicillins Other (See Comments)    Reaction:  Unknown     Current Facility-Administered Medications  Medication Dose Route Frequency Provider Last Rate Last Dose  . 0.9 %  sodium chloride infusion  250 mL Intravenous PRN Dustin Flock, MD      . acetaminophen (TYLENOL) tablet 650 mg  650 mg Oral Q6H PRN Dustin Flock, MD       Or  . acetaminophen (TYLENOL) suppository 650 mg  650 mg Rectal Q6H PRN Dustin Flock, MD      . albuterol (PROVENTIL) (2.5 MG/3ML) 0.083% nebulizer solution 2.5 mg  2.5 mg Inhalation Q4H PRN Dustin Flock, MD      . aspirin EC tablet 325 mg  325 mg Oral Daily Dustin Flock, MD   325 mg at 08/20/15 0843  . atorvastatin (LIPITOR) tablet 40 mg  40 mg Oral QHS Dustin Flock, MD   40 mg at 08/19/15 2127  . enoxaparin (LOVENOX) injection 40 mg  40 mg Subcutaneous Q24H Dustin Flock, MD   40 mg at 08/19/15 1900  . fluticasone furoate-vilanterol (BREO ELLIPTA) 100-25 MCG/INH 1 puff  1 puff Inhalation Daily Dustin Flock, MD   1 puff at 08/20/15 1000  . guaiFENesin (MUCINEX) 12 hr tablet 600 mg  600 mg Oral BID Dustin Flock, MD   600 mg at 08/20/15 0843  . HYDROcodone-acetaminophen (NORCO/VICODIN) 5-325 MG per tablet 1-2 tablet  1-2 tablet Oral Q4H PRN Dustin Flock, MD      . ipratropium-albuterol (DUONEB) 0.5-2.5 (3) MG/3ML nebulizer solution 3 mL  3 mL Nebulization Q4H Dustin Flock, MD   3 mL at 08/20/15 1639  . levofloxacin (LEVAQUIN) IVPB 500 mg  500 mg Intravenous Q24H Dustin Flock, MD   500 mg at 08/19/15 2244  . LORazepam (ATIVAN) injection 0.5 mg  0.5 mg Intravenous Once Loletha Grayer, MD      . LORazepam (ATIVAN) tablet 0.5 mg  0.5 mg Oral TID PC Loletha Grayer, MD   0.5 mg at 08/20/15 1449  . methylPREDNISolone sodium succinate (SOLU-MEDROL) 40 mg/mL injection 40 mg  40 mg Intravenous Q12H Loletha Grayer, MD      . morphine 2 MG/ML injection 1 mg  1 mg  Intravenous Q4H PRN Dustin Flock, MD      . ondansetron (ZOFRAN) tablet 4 mg  4 mg Oral Q6H PRN Dustin Flock, MD       Or  . ondansetron (ZOFRAN) injection 4 mg  4 mg Intravenous Q6H PRN Dustin Flock, MD      . pantoprazole (PROTONIX) EC tablet 40 mg  40 mg Oral Daily Dustin Flock, MD   40 mg at 08/20/15 0844  . potassium chloride SA (K-DUR,KLOR-CON) CR tablet 20 mEq  20 mEq Oral Daily Dustin Flock, MD   20 mEq at 08/20/15 0844  . QUEtiapine (SEROQUEL XR) 24 hr tablet 300 mg  300 mg  Oral QHS Dustin Flock, MD   300 mg at 08/19/15 2127  . sodium chloride flush (NS) 0.9 % injection 3 mL  3 mL Intravenous Q12H Dustin Flock, MD   3 mL at 08/20/15 1000  . sodium chloride flush (NS) 0.9 % injection 3 mL  3 mL Intravenous Q12H Dustin Flock, MD   3 mL at 08/20/15 1000  . sodium chloride flush (NS) 0.9 % injection 3 mL  3 mL Intravenous PRN Dustin Flock, MD      . tamsulosin (FLOMAX) capsule 0.4 mg  0.4 mg Oral QPC breakfast Dustin Flock, MD   0.4 mg at 08/20/15 0843  . tiotropium (SPIRIVA) inhalation capsule 18 mcg  18 mcg Inhalation Daily Dustin Flock, MD   18 mcg at 08/20/15 1000  . venlafaxine XR (EFFEXOR-XR) 24 hr capsule 150 mg  150 mg Oral Daily Dustin Flock, MD   150 mg at 08/20/15 0843  . zolpidem (AMBIEN) tablet 10 mg  10 mg Oral QHS Dustin Flock, MD   10 mg at 08/19/15 2126    OBJECTIVE:  Filed Vitals:   08/20/15 0741 08/20/15 1117  BP: 111/61 103/59  Pulse: 72 86  Temp:  97.4 F (36.3 C)  Resp:  18     Body mass index is 20.51 kg/(m^2).    ECOG FS:1 - Symptomatic but completely ambulatory  PHYSICAL EXAM: GENERAL:  She is somewhat confused MENTAL STATUS:  Alert and oriented to person, place and time. HEAD: Alopecia  Normocephalic, atraumatic, face symmetric, no Cushingoid features. EYES:  Pupils equal round and reactive to light and accomodation.  No conjunctivitis or scleral icterus. ENT:  Oropharynx clear without lesion.  Tongue normal. Mucous membranes  moist.  RESPIRATORY: Rhonchi on both sides.  Dullness on percussion on the left side.  Yours crepitation on left side. On oxygen  CARDIOVASCULAR:  Regular rate and rhythm without murmur, rub or gallop.  ABDOMEN:  Soft, non-tender, with active bowel sounds, and no hepatosplenomegaly.  No masses. BACK:  No CVA tenderness.  No tenderness on percussion of the back or rib cage. SKIN: Maculopapular rash. EXTREMITIES: No edema, no skin discoloration or tenderness.  No palpable cords. LYMPH NODES: No palpable cervical, supraclavicular, axillary or inguinal adenopathy  NEUROLOGICAL: Unremarkable. PSYCH:  Patient is somewhat anxious   LAB RESULTS:  Admission on 08/19/2015  Component Date Value Ref Range Status  . WBC 08/19/2015 7.1  3.8 - 10.6 K/uL Final  . RBC 08/19/2015 3.82* 4.40 - 5.90 MIL/uL Final  . Hemoglobin 08/19/2015 11.4* 13.0 - 18.0 g/dL Final  . HCT 08/19/2015 34.3* 40.0 - 52.0 % Final  . MCV 08/19/2015 89.8  80.0 - 100.0 fL Final  . MCH 08/19/2015 29.8  26.0 - 34.0 pg Final  . MCHC 08/19/2015 33.2  32.0 - 36.0 g/dL Final  . RDW 08/19/2015 14.7* 11.5 - 14.5 % Final  . Platelets 08/19/2015 194  150 - 440 K/uL Final  . Neutrophils Relative % 08/19/2015 75%   Final  . Neutro Abs 08/19/2015 5.3  1.4 - 6.5 K/uL Final  . Lymphocytes Relative 08/19/2015 10%   Final  . Lymphs Abs 08/19/2015 0.7* 1.0 - 3.6 K/uL Final  . Monocytes Relative 08/19/2015 14%   Final  . Monocytes Absolute 08/19/2015 1.0  0.2 - 1.0 K/uL Final  . Eosinophils Relative 08/19/2015 1%   Final  . Eosinophils Absolute 08/19/2015 0.1  0 - 0.7 K/uL Final  . Basophils Relative 08/19/2015 0%   Final  . Basophils Absolute 08/19/2015 0.0  0 - 0.1 K/uL Final  . Sodium 08/19/2015 132* 135 - 145 mmol/L Final  . Potassium 08/19/2015 3.5  3.5 - 5.1 mmol/L Final  . Chloride 08/19/2015 93* 101 - 111 mmol/L Final  . CO2 08/19/2015 27  22 - 32 mmol/L Final  . Glucose, Bld 08/19/2015 97  65 - 99 mg/dL Final  . BUN 08/19/2015  14  6 - 20 mg/dL Final  . Creatinine, Ser 08/19/2015 0.90  0.61 - 1.24 mg/dL Final  . Calcium 08/19/2015 8.1* 8.9 - 10.3 mg/dL Final  . GFR calc non Af Amer 08/19/2015 >60  >60 mL/min Final  . GFR calc Af Amer 08/19/2015 >60  >60 mL/min Final   Comment: (NOTE) The eGFR has been calculated using the CKD EPI equation. This calculation has not been validated in all clinical situations. eGFR's persistently <60 mL/min signify possible Chronic Kidney Disease.   . Anion gap 08/19/2015 12  5 - 15 Final  . Troponin I 08/19/2015 0.29* <0.031 ng/mL Final   Comment: READ BACK AND VERIFIED WITH KIM MAIN RN AT 1410 08/19/15 MSS.        PERSISTENTLY INCREASED TROPONIN VALUES IN THE RANGE OF 0.04-0.49 ng/mL CAN BE SEEN IN:       -UNSTABLE ANGINA       -CONGESTIVE HEART FAILURE       -MYOCARDITIS       -CHEST TRAUMA       -ARRYHTHMIAS       -LATE PRESENTING MYOCARDIAL INFARCTION       -COPD   CLINICAL FOLLOW-UP RECOMMENDED.   Marland Kitchen Color, Urine 08/19/2015 YELLOW* YELLOW Final  . APPearance 08/19/2015 HAZY* CLEAR Final  . Glucose, UA 08/19/2015 NEGATIVE  NEGATIVE mg/dL Final  . Bilirubin Urine 08/19/2015 NEGATIVE  NEGATIVE Final  . Ketones, ur 08/19/2015 NEGATIVE  NEGATIVE mg/dL Final  . Specific Gravity, Urine 08/19/2015 1.009  1.005 - 1.030 Final  . Hgb urine dipstick 08/19/2015 1+* NEGATIVE Final  . pH 08/19/2015 6.0  5.0 - 8.0 Final  . Protein, ur 08/19/2015 NEGATIVE  NEGATIVE mg/dL Final  . Nitrite 08/19/2015 POSITIVE* NEGATIVE Final  . Leukocytes, UA 08/19/2015 2+* NEGATIVE Final  . RBC / HPF 08/19/2015 0-5  0 - 5 RBC/hpf Final  . WBC, UA 08/19/2015 TOO NUMEROUS TO COUNT  0 - 5 WBC/hpf Final  . Bacteria, UA 08/19/2015 NONE SEEN  NONE SEEN Final  . Squamous Epithelial / LPF 08/19/2015 0-5* NONE SEEN Final  . WBC Clumps 08/19/2015 PRESENT   Final  . WBC 08/19/2015 7.5  3.8 - 10.6 K/uL Final  . RBC 08/19/2015 3.65* 4.40 - 5.90 MIL/uL Final  . Hemoglobin 08/19/2015 11.1* 13.0 - 18.0 g/dL  Final  . HCT 08/19/2015 33.0* 40.0 - 52.0 % Final  . MCV 08/19/2015 90.2  80.0 - 100.0 fL Final  . MCH 08/19/2015 30.3  26.0 - 34.0 pg Final  . MCHC 08/19/2015 33.5  32.0 - 36.0 g/dL Final  . RDW 08/19/2015 14.7* 11.5 - 14.5 % Final  . Platelets 08/19/2015 173  150 - 440 K/uL Final  . Creatinine, Ser 08/19/2015 0.77  0.61 - 1.24 mg/dL Final  . GFR calc non Af Amer 08/19/2015 >60  >60 mL/min Final  . GFR calc Af Amer 08/19/2015 >60  >60 mL/min Final   Comment: (NOTE) The eGFR has been calculated using the CKD EPI equation. This calculation has not been validated in all clinical situations. eGFR's persistently <60 mL/min signify possible Chronic Kidney Disease.   . Troponin I  08/19/2015 0.04* <0.031 ng/mL Final   Comment: PREVIOUS RESULT CALLED AT 1410 08/19/15 MSS...MLZ        PERSISTENTLY INCREASED TROPONIN VALUES IN THE RANGE OF 0.04-0.49 ng/mL CAN BE SEEN IN:       -UNSTABLE ANGINA       -CONGESTIVE HEART FAILURE       -MYOCARDITIS       -CHEST TRAUMA       -ARRYHTHMIAS       -LATE PRESENTING MYOCARDIAL INFARCTION       -COPD   CLINICAL FOLLOW-UP RECOMMENDED.   Marland Kitchen Troponin I 08/19/2015 <0.03  <0.031 ng/mL Final   Comment:        NO INDICATION OF MYOCARDIAL INJURY.   . Troponin I 08/20/2015 <0.03  <0.031 ng/mL Final   Comment:        NO INDICATION OF MYOCARDIAL INJURY.   . Weight 08/19/2015 2096   Final  . Height 08/19/2015 67   Final  . BP 08/19/2015 161/73   Final  . WBC 08/20/2015 5.3  3.8 - 10.6 K/uL Final  . RBC 08/20/2015 3.47* 4.40 - 5.90 MIL/uL Final  . Hemoglobin 08/20/2015 10.5* 13.0 - 18.0 g/dL Final  . HCT 08/20/2015 30.5* 40.0 - 52.0 % Final  . MCV 08/20/2015 87.9  80.0 - 100.0 fL Final  . MCH 08/20/2015 30.2  26.0 - 34.0 pg Final  . MCHC 08/20/2015 34.3  32.0 - 36.0 g/dL Final  . RDW 08/20/2015 14.4  11.5 - 14.5 % Final  . Platelets 08/20/2015 163  150 - 440 K/uL Final  . Sodium 08/20/2015 139  135 - 145 mmol/L Final  . Potassium 08/20/2015 3.8  3.5 -  5.1 mmol/L Final  . Chloride 08/20/2015 102  101 - 111 mmol/L Final  . CO2 08/20/2015 29  22 - 32 mmol/L Final  . Glucose, Bld 08/20/2015 175* 65 - 99 mg/dL Final  . BUN 08/20/2015 14  6 - 20 mg/dL Final  . Creatinine, Ser 08/20/2015 0.77  0.61 - 1.24 mg/dL Final  . Calcium 08/20/2015 8.1* 8.9 - 10.3 mg/dL Final  . GFR calc non Af Amer 08/20/2015 >60  >60 mL/min Final  . GFR calc Af Amer 08/20/2015 >60  >60 mL/min Final   Comment: (NOTE) The eGFR has been calculated using the CKD EPI equation. This calculation has not been validated in all clinical situations. eGFR's persistently <60 mL/min signify possible Chronic Kidney Disease.   . 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 CT scan of the chest has been reviewed MRI and CT scan of the head has been reviewed.  Patient has progressive disease.  There is superimposed acute bacterial infection  There is no evidence of metastases disease to brain Off and on confusion described by family is probably related to patient's old age with a high dose of lorazepam that he is taking is probably self-medicating and also using Ambien at bedtime for sleep  Sister described overall poor social situation and they are there to help him but they are not sure whether the patient can take care of himself but patient does not want to go to any nursing home or a sister living facility  Re: Progressing cancer of her options include further chemotherapy or consideration of palliative and hospice care I briefly discussed those options and patient desires to continue further treatment but  that would be decided for the patient has any improvement in performance status I will see this patient next week for evaluation as an outpatient.  Constipation we will put patient on senna as as well as give a dose of lactulose  Patient  and family disease desires home health nurses to come out  He needs to decrease the dose of lorazepam as well as avoid Ambien if possible  Patient may benefit from steroid . nonfasting blood sugar was slightly elevated and can be monitored.  Prednisone 40 mg with rapid taper by 10 mg 20 might help along with oral antibiotics   Staging form: Lung, AJCC 7th Edition    Clinical: Stage IIIA (T4, N0, M0) - Signed by Forest Gleason, MD on 10/29/2014   Forest Gleason, MD   08/20/2015 4:46 PM

## 2015-08-20 NOTE — Consult Note (Signed)
[facility] Consultation Note  Patient ID: Timothy Ali, MRN: [id number], DOB/AGE: Sep 07, 1929 82 y.o. Admit date: 08/19/2015   Date of Consult: 08/20/2015 Primary Physician: Albina Billet, MD Primary Cardiologist:None  Chief Complaint:  Chief Complaint  Patient presents with  . Shortness of Breath  . Cough   Reason for Consult: left-sided chest pain with shortness of breath  HPI: 82 y.o. male with known lung cancer and essential hypertension mixed hyperlipidemia with anemia likely due to lung cancer and recent progression of shortness of breath productive cough with sputum as well as shortness of breath associated with left upper chest discomfort waxing and waning over the last several days. He was seen in the emergency room with a normal EKG and an elevated troponin of 0.04 likely secondary to current condition and other medical issues rather than acute coronary syndrome. The patient has subsequently had an echocardiogram showing normal LV systolic function with no evidence of valvular heart disease heart failure or previous myocardial infarction. Ration is hemodynamically stable  Past Medical History  Diagnosis Date  . Major depressive disorder, recurrent, severe with psychotic features ([place])   . Persistent disorder of initiating or maintaining sleep   . Generalized anxiety disorder   . Major depressive disorder, recurrent, severe with psychotic features ([place])   . Persistent disorder of initiating or maintaining sleep   . Asthma   . Cancer ([place])     Left lung  . COPD (chronic obstructive pulmonary disease) [facility])       Surgical History:  Past Surgical History  Procedure Laterality Date  . Hemorroidectomy    . Lung surgery       Home Meds: Prior to Admission medications   Medication Sig Start Date End Date Taking? Authorizing Provider  albuterol (PROVENTIL HFA;VENTOLIN HFA) 108 (90 BASE) MCG/ACT inhaler Inhale 2 puffs into the lungs every 4 (four) hours as needed  for wheezing or shortness of breath.    Yes Historical Provider, MD  atorvastatin (LIPITOR) 40 MG tablet Take 40 mg by mouth at bedtime.    Yes Historical Provider, MD  BREO ELLIPTA 100-25 MCG/INH AEPB Inhale 1 puff into the lungs daily.    Yes Historical Provider, MD  esomeprazole (NEXIUM) 40 MG capsule Take 40 mg by mouth daily.    Yes Historical Provider, MD  LORazepam (ATIVAN) 0.5 MG tablet Take 0.5 mg by mouth every 8 (eight) hours. Pt takes with a '1mg'$  tablet.   Yes Historical Provider, MD  LORazepam (ATIVAN) 1 MG tablet Take 1 mg by mouth every 8 (eight) hours. Pt takes with a 0.'5mg'$  tablet.   Yes Historical Provider, MD  potassium chloride SA (K-DUR,KLOR-CON) 20 MEQ tablet Take 1 tablet (20 mEq total) by mouth daily. 05/14/15  Yes Forest Gleason, MD  QUEtiapine (SEROQUEL XR) 300 MG 24 hr tablet Take 1 tablet (300 mg total) by mouth at bedtime. 06/29/15  Yes Himabindu Ravi, MD  tamsulosin (FLOMAX) 0.4 MG CAPS capsule Take 0.4 mg by mouth daily after breakfast.    Yes Historical Provider, MD  tiotropium (SPIRIVA) 18 MCG inhalation capsule Place 18 mcg into inhaler and inhale daily.    Yes Historical Provider, MD  venlafaxine XR (EFFEXOR-XR) 150 MG 24 hr capsule Take 1 capsule (150 mg total) by mouth daily. 06/29/15  Yes Himabindu Ravi, MD  zolpidem (AMBIEN) 10 MG tablet Take 1 tablet (10 mg total) by mouth at bedtime. 04/07/15  Yes Marjie Skiff, MD    Inpatient Medications:  . aspirin EC  325 mg Oral Daily  . atorvastatin  40 mg Oral QHS  . enoxaparin (LOVENOX) injection  40 mg Subcutaneous Q24H  . fluticasone furoate-vilanterol  1 puff Inhalation Daily  . guaiFENesin  600 mg Oral BID  . ipratropium-albuterol  3 mL Nebulization Q4H  . levofloxacin (LEVAQUIN) IV  500 mg Intravenous Q24H  . LORazepam  0.5 mg Intravenous Once  . LORazepam  0.5 mg Oral TID PC  . methylPREDNISolone (SOLU-MEDROL) injection  40 mg Intravenous Q12H  . pantoprazole  40 mg Oral Daily  . potassium chloride SA  20  mEq Oral Daily  . QUEtiapine  300 mg Oral QHS  . sodium chloride flush  3 mL Intravenous Q12H  . sodium chloride flush  3 mL Intravenous Q12H  . tamsulosin  0.4 mg Oral QPC breakfast  . tiotropium  18 mcg Inhalation Daily  . venlafaxine XR  150 mg Oral Daily  . zolpidem  10 mg Oral QHS      Allergies:  Allergies  Allergen Reactions  . Penicillins Other (See Comments)    Reaction:  Unknown     Social History   Social History  . Marital Status: Widowed    Spouse Name: N/A  . Number of Children: N/A  . Years of Education: N/A   Occupational History  . Not on file.   Social History Main Topics  . Smoking status: Former Research scientist (life sciences)  . Smokeless tobacco: Never Used     Comment: quit 18 years ago  . Alcohol Use: No  . Drug Use: No  . Sexual Activity: No   Other Topics Concern  . Not on file   Social History Narrative     Family History  Problem Relation Age of Onset  . COPD Mother   . Lung cancer Father   . Kidney disease Neg Hx   . Prostate cancer Neg Hx      Review of Systems Positive forChest pain shortness of breath cough congestion Negative for: General:  chills, fever, night sweats or weight changes.  Cardiovascular: PND orthopnea syncope dizziness  Dermatological skin lesions rashes Respiratory: Positive for Cough congestion Urologic: Frequent urination urination at night and hematuria Abdominal: negative for nausea, vomiting, diarrhea, bright red blood per rectum, melena, or hematemesis Neurologic: negative for visual changes, and/or hearing changes  All other systems reviewed and are otherwise negative except as noted above.  Labs:  Recent Labs  08/19/15 1155 08/19/15 1915 08/19/15 2155 08/20/15 0430  TROPONINI 0.29* 0.04* <0.03 <0.03   Lab Results  Component Value Date   WBC 5.3 08/20/2015   HGB 10.5* 08/20/2015   HCT 30.5* 08/20/2015   MCV 87.9 08/20/2015   PLT 163 08/20/2015    Recent Labs Lab 08/20/15 0430  NA 139  K 3.8  CL 102   CO2 29  BUN 14  CREATININE 0.77  CALCIUM 8.1*  GLUCOSE 175*   Lab Results  Component Value Date   CHOL 147 04/02/2014   HDL 64 04/02/2014   LDLCALC 61 04/02/2014   TRIG 108 04/02/2014   No results found for: DDIMER  Radiology/Studies:  Dg Chest 2 View  08/19/2015  CLINICAL DATA:  Sibling reports patient having difficulty getting words out of mouth EXAM: CHEST  2 VIEW COMPARISON:  02/26/2015 FINDINGS: Normal heart size. There is no pleural effusion or edema identified. Chronic lung disease is identified bilaterally. Lateral left lung base opacity and left upper lobe perihilar opacity are identified which may be related to known lung cancer.  The right lung is clear. IMPRESSION: 1. Left lung opacities are likely related to patient's known lung cancer and/or treatment related changes. 2. No acute findings identified Electronically Signed   By: Kerby Moors M.D.   On: 08/19/2015 12:46   Ct Head Wo Contrast  08/19/2015  CLINICAL DATA:  Slurred speech and headache; lung carcinoma EXAM: CT HEAD WITHOUT CONTRAST TECHNIQUE: Contiguous axial images were obtained from the base of the skull through the vertex without intravenous contrast. COMPARISON:  None. FINDINGS: There is mild diffuse atrophy. There is no intracranial mass, hemorrhage, extra-axial fluid collection, or midline shift. There is mild small vessel disease in the centra semiovale bilaterally. Elsewhere gray-white compartments appear normal. No acute infarct is evident. The bony calvarium appears intact although bones are osteoporotic. Mastoid air cells are clear. There is debris in each external auditory canal. No intraorbital lesions are evident in the visualized orbital regions. IMPRESSION: Mild atrophy with mild patchy periventricular small vessel disease. No intracranial mass, hemorrhage, or evidence of acute infarct. No intracranial edema. Probable cerumen in each external auditory canal. Electronically Signed   By: Lowella Grip III  M.D.   On: 08/19/2015 12:52   Ct Angio Chest Pe W/cm &/or Wo Cm  08/19/2015  CLINICAL DATA:  Shortness of breath and feeling bad. History of lung cancer. EXAM: CT ANGIOGRAPHY CHEST WITH CONTRAST TECHNIQUE: Multidetector CT imaging of the chest was performed using the standard protocol during bolus administration of intravenous contrast. Multiplanar CT image reconstructions and MIPs were obtained to evaluate the vascular anatomy. CONTRAST:  75 mL Isovue 370 COMPARISON:  PET-CT 08/05/2015 FINDINGS: Mediastinum/Lymph Nodes: No evidence to suggest a pulmonary embolism. Limited evaluation of some of the upper pulmonary arteries due to motion artifact. Coronary artery calcifications. No significant pericardial effusion. Subcarinal tissue measures 1.5 cm in the short axis and previously measured 0.9 cm. No significant axillary lymphadenopathy. Lungs/Pleura: The trachea and mainstem bronchi are patent. There is chronic scarring and volume loss in the left upper lobe along the left major fissure. Diffuse emphysematous changes particularly in the left lower lobe. Again noted is a irregular pleural-based lesion in left lower lobe on sequence 6, image 114. Difficult to accurately measure this area due to adjacent parenchymal densities but lesion roughly measures 2.6 x 1.6 cm and previously measured 2.6 x 1.1 cm. Again noted is a pleural-based nodule in the posterior left upper lobe that previously measured 0.8 x 0.7 cm and now measures 0.9 x 1.0 cm. There are adjacent parenchymal densities in this area. There are increased parenchymal densities and interstitial thickening in the left upper lobe near the apex. New linear densities extending from the anterior chest may represent areas of atelectasis. Again noted are patchy pleural-based densities in the medial right lower lobe probably representing scarring. Upper abdomen: Images upper abdomen have some limitations due to motion artifact. No acute abnormalities in the upper  abdomen. Musculoskeletal: Again noted is a soft tissue lesion involving the posterior left fourth rib. Difficult to identify the margins of this tumor but roughly measures 3.6 x 1.8 cm and minimally changed from the recent comparison examination. There is adjacent pleural thickening in this region. No clear evidence for a new bone lesion. Multilevel degenerative changes in the thoracic spine. Review of the MIP images confirms the above findings. IMPRESSION: No evidence for pulmonary embolism. Study has some technical limitations due to motion artifact as described. Concern for progression of the metastatic lung disease demonstrated by slight enlargement of the pleural-based left lower  lobe lesions and enlarging subcarinal lymph node. Again noted is a soft tissue mass involving the left fourth rib and posterior chest wall. Severe emphysematous disease with increased parenchymal densities or interstitial thickening at the left lung apex. This could represent an infectious or inflammatory process. There are additional patchy parenchymal densities in the superior segment in the left lower lobe which could be inflammatory as well. Electronically Signed   By: Markus Daft M.D.   On: 08/19/2015 15:20   Nm Pet Image Restag (ps) Skull Base To Thigh  08/07/2015  CLINICAL DATA:  Subsequent Treatment strategy for lung cancer. EXAM: NUCLEAR MEDICINE PET SKULL BASE TO THIGH TECHNIQUE: 12.2 mCi F-18 FDG was injected intravenously. Full-ring PET imaging was performed from the skull base to thigh after the radiotracer. CT data was obtained and used for attenuation correction and anatomic localization. FASTING BLOOD GLUCOSE:  Value: 73 mg/dl COMPARISON:  06/01/2015 FINDINGS: NECK Tiny focus of focal FDG uptake in the posterior left parotid gland is stable nonspecific. CHEST The left paravertebral chest wall lesion is not substantially changed in the interval, measuring 1.9 x 3.5 cm today compared to 1.9 x 3.8 cm previously. Lesion  remains hypermetabolic with SUV max = 13.2 today compared to 25.5 previously. The hypermetabolic left lower lobe subpleural nodule persists, stable in size at 8 mm with SUV max = 5.2 compared to 2.4 previously. In the interval since the prior study there has been interstitial and patchy airspace disease developed in both upper lobes which shows low level hypermetabolism with SUV max = 3-4. A new irregular nodular area of opacity in the inferior left lower lobe (image 125 series 3) is hypermetabolic with SUV max = 4.7. Emphysema is noted bilaterally. Scarring in the lingula is stable in shows low level FDG uptake. Airway impaction is seen in the right lower lobe similar to prior. ABDOMEN/PELVIS No abnormal hypermetabolic activity within the liver, pancreas, adrenal glands, or spleen. No hypermetabolic lymph nodes in the abdomen or pelvis. Stable right renal cyst without evidence of hypermetabolism There is abdominal aortic atherosclerosis without aneurysm. SKELETON No focal hypermetabolic activity to suggest skeletal metastasis. IMPRESSION: 1. Continued further increase and hypermetabolic FDG uptake in the left posteromedial extrapleural chest wall lesion and left lower lobe nodule. There is a new irregular left lower lobe peripheral opacity which is hypermetabolic and may be infectious (pneumonia) but disease progression at this location is also possibility. 2. Interval development of interstitial and patchy opacity in the upper lungs with low level FDG uptake. Imaging features are probably related to infection/inflammation. Electronically Signed   By: Misty Stanley M.D.   On: 08/07/2015 16:33    EKG: Normal sinus rhythm  Weights: Filed Weights   08/19/15 1122  Weight: 131 lb (59.421 kg)     Physical Exam: Blood pressure 103/59, pulse 86, temperature 97.4 F (36.3 C), temperature source Oral, resp. rate 18, height '5\' 7"'$  (1.702 m), weight 131 lb (59.421 kg), SpO2 92 %. Body mass index is 20.51  kg/(m^2). General: Well developed, well nourished, in no acute distress. Head eyes ears nose throat: Normocephalic, atraumatic, sclera non-icteric, no xanthomas, nares are without discharge. No apparent thyromegaly and/or mass  Lungs: Normal respiratory effort.  no wheezes, no rales, some rhonchi.  Heart: RRR with normal S1 S2. no murmur gallop, no rub, PMI is normal size and placement, carotid upstroke normal without bruit, jugular venous pressure is normal Abdomen: Soft, non-tender, non-distended with normoactive bowel sounds. No hepatomegaly. No rebound/guarding. No obvious abdominal masses.  Abdominal aorta is normal size without bruit Extremities: Trace edema. no cyanosis, no clubbing, no ulcers  Peripheral : 2+ bilateral upper extremity pulses, 2+ bilateral femoral pulses, 2+ bilateral dorsal pedal pulse Neuro: Not Alert and oriented. No facial asymmetry. No focal deficit. Moves all extremities spontaneously. Musculoskeletal: Normal muscle tone without kyphosis Psych:  Difficulty with Responds to questions appropriately with a normal affect.    Assessment: 82 year old male with hypertension hyperlipidemia lung cancer with anemia and encephalopathy with progression of shortness of breath cough congestion and left-sided chest pain more consistent with current illness and/or infection or cancer rather than acute coronary syndrome with minimal elevation of troponin consistent with demand ischemia  Plan: 1. No further cardiac intervention with normal ejection fraction of 60% with recent echocardiogram and no evidence of significant LV dysfunction or heart failure 2. Continue treatment of left sided chest pain most consistent with cancer and possible infection 3. Continue high intensity cholesterol therapy as able for further risk reduction cardiovascular disease 4. I per tension control with beta blocker if needed 5. Call if further questions  Signed, Corey Skains M.D. [facility] 08/20/2015, 1:31 PM

## 2015-08-20 NOTE — Progress Notes (Signed)
Patient ID: Timothy Ali, male   DOB: Dec 27, 1929, 82 y.o.   MRN: [id number] [facility] PROGRESS NOTE  KELLAR WESTBERG YQM:[id number] DOB: [birth year] DOA: 08/19/2015 PCP: Albina Billet, MD  HPI/Subjective: Patient wants to go home. He states his breathing is always bad. Sister is concerned about his mental status. Patient had trouble answering all questions appropriately. He is able to move his arms and legs without any problems. I think I convinced the patient to stay at least 1 more day.  Objective: Filed Vitals:   08/20/15 0342 08/20/15 0741  BP: 95/46 111/61  Pulse: 71 72  Temp: 97.6 F (36.4 C)   Resp: 20     Filed Weights   08/19/15 1122  Weight: 59.421 kg (131 lb)    ROS: Review of Systems  Constitutional: Negative for fever and chills.  Eyes: Negative for blurred vision.  Respiratory: Positive for cough, sputum production and shortness of breath.   Cardiovascular: Negative for chest pain.  Gastrointestinal: Negative for nausea, vomiting, abdominal pain, diarrhea and constipation.  Genitourinary: Negative for dysuria.  Musculoskeletal: Negative for joint pain.  Neurological: Negative for dizziness and headaches.   Exam: Physical Exam  HENT:  Nose: No mucosal edema.  Mouth/Throat: No oropharyngeal exudate or posterior oropharyngeal edema.  Eyes: Conjunctivae, EOM and lids are normal. Pupils are equal, round, and reactive to light.  Neck: No JVD present. Carotid bruit is not present. No edema present. No thyroid mass and no thyromegaly present.  Cardiovascular: S1 normal and S2 normal.  Exam reveals no gallop.   Murmur heard.  Systolic murmur is present with a grade of 2/6  Pulses:      Dorsalis pedis pulses are 2+ on the right side, and 2+ on the left side.  Respiratory: No respiratory distress. He has decreased breath sounds in the right middle field, the right lower field, the left middle field and the left lower field. He has wheezes in the right lower field  and the left lower field. He has no rhonchi. He has no rales.  GI: Soft. Bowel sounds are normal. There is no tenderness.  Musculoskeletal:       Right ankle: He exhibits no swelling.       Left ankle: He exhibits no swelling.  Lymphadenopathy:    He has no cervical adenopathy.  Neurological: He is alert. No cranial nerve deficit.  Able to straight leg raise bilaterally without problem. Power 5 out of 5 upper and lower extremities. Patient did have some trouble answering some questions.  Skin: Skin is warm. No rash noted. Nails show no clubbing.  Psychiatric: He has a normal mood and affect.      Data Reviewed: Basic Metabolic Panel:  Recent Labs Lab 08/19/15 1155 08/19/15 1915 08/20/15 0430  NA 132*  --  139  K 3.5  --  3.8  CL 93*  --  102  CO2 27  --  29  GLUCOSE 97  --  175*  BUN 14  --  14  CREATININE 0.90 0.77 0.77  CALCIUM 8.1*  --  8.1*   CBC:  Recent Labs Lab 08/19/15 1155 08/19/15 1915 08/20/15 0430  WBC 7.1 7.5 5.3  NEUTROABS 5.3  --   --   HGB 11.4* 11.1* 10.5*  HCT 34.3* 33.0* 30.5*  MCV 89.8 90.2 87.9  PLT 194 173 163   Cardiac Enzymes:  Recent Labs Lab 08/19/15 1155 08/19/15 1915 08/19/15 2155 08/20/15 0430  TROPONINI 0.29* 0.04* <0.03 <0.03  Studies: Dg Chest 2 View  08/19/2015  CLINICAL DATA:  Sibling reports patient having difficulty getting words out of mouth EXAM: CHEST  2 VIEW COMPARISON:  02/26/2015 FINDINGS: Normal heart size. There is no pleural effusion or edema identified. Chronic lung disease is identified bilaterally. Lateral left lung base opacity and left upper lobe perihilar opacity are identified which may be related to known lung cancer. The right lung is clear. IMPRESSION: 1. Left lung opacities are likely related to patient's known lung cancer and/or treatment related changes. 2. No acute findings identified Electronically Signed   By: Kerby Moors M.D.   On: 08/19/2015 12:46   Ct Head Wo Contrast  08/19/2015  CLINICAL  DATA:  Slurred speech and headache; lung carcinoma EXAM: CT HEAD WITHOUT CONTRAST TECHNIQUE: Contiguous axial images were obtained from the base of the skull through the vertex without intravenous contrast. COMPARISON:  None. FINDINGS: There is mild diffuse atrophy. There is no intracranial mass, hemorrhage, extra-axial fluid collection, or midline shift. There is mild small vessel disease in the centra semiovale bilaterally. Elsewhere gray-white compartments appear normal. No acute infarct is evident. The bony calvarium appears intact although bones are osteoporotic. Mastoid air cells are clear. There is debris in each external auditory canal. No intraorbital lesions are evident in the visualized orbital regions. IMPRESSION: Mild atrophy with mild patchy periventricular small vessel disease. No intracranial mass, hemorrhage, or evidence of acute infarct. No intracranial edema. Probable cerumen in each external auditory canal. Electronically Signed   By: Lowella Grip III M.D.   On: 08/19/2015 12:52   Ct Angio Chest Pe W/cm &/or Wo Cm  08/19/2015  CLINICAL DATA:  Shortness of breath and feeling bad. History of lung cancer. EXAM: CT ANGIOGRAPHY CHEST WITH CONTRAST TECHNIQUE: Multidetector CT imaging of the chest was performed using the standard protocol during bolus administration of intravenous contrast. Multiplanar CT image reconstructions and MIPs were obtained to evaluate the vascular anatomy. CONTRAST:  75 mL Isovue 370 COMPARISON:  PET-CT 08/05/2015 FINDINGS: Mediastinum/Lymph Nodes: No evidence to suggest a pulmonary embolism. Limited evaluation of some of the upper pulmonary arteries due to motion artifact. Coronary artery calcifications. No significant pericardial effusion. Subcarinal tissue measures 1.5 cm in the short axis and previously measured 0.9 cm. No significant axillary lymphadenopathy. Lungs/Pleura: The trachea and mainstem bronchi are patent. There is chronic scarring and volume loss in the  left upper lobe along the left major fissure. Diffuse emphysematous changes particularly in the left lower lobe. Again noted is a irregular pleural-based lesion in left lower lobe on sequence 6, image 114. Difficult to accurately measure this area due to adjacent parenchymal densities but lesion roughly measures 2.6 x 1.6 cm and previously measured 2.6 x 1.1 cm. Again noted is a pleural-based nodule in the posterior left upper lobe that previously measured 0.8 x 0.7 cm and now measures 0.9 x 1.0 cm. There are adjacent parenchymal densities in this area. There are increased parenchymal densities and interstitial thickening in the left upper lobe near the apex. New linear densities extending from the anterior chest may represent areas of atelectasis. Again noted are patchy pleural-based densities in the medial right lower lobe probably representing scarring. Upper abdomen: Images upper abdomen have some limitations due to motion artifact. No acute abnormalities in the upper abdomen. Musculoskeletal: Again noted is a soft tissue lesion involving the posterior left fourth rib. Difficult to identify the margins of this tumor but roughly measures 3.6 x 1.8 cm and minimally changed from the recent  comparison examination. There is adjacent pleural thickening in this region. No clear evidence for a new bone lesion. Multilevel degenerative changes in the thoracic spine. Review of the MIP images confirms the above findings. IMPRESSION: No evidence for pulmonary embolism. Study has some technical limitations due to motion artifact as described. Concern for progression of the metastatic lung disease demonstrated by slight enlargement of the pleural-based left lower lobe lesions and enlarging subcarinal lymph node. Again noted is a soft tissue mass involving the left fourth rib and posterior chest wall. Severe emphysematous disease with increased parenchymal densities or interstitial thickening at the left lung apex. This could  represent an infectious or inflammatory process. There are additional patchy parenchymal densities in the superior segment in the left lower lobe which could be inflammatory as well. Electronically Signed   By: Markus Daft M.D.   On: 08/19/2015 15:20    Scheduled Meds: . aspirin EC  325 mg Oral Daily  . atorvastatin  40 mg Oral QHS  . enoxaparin (LOVENOX) injection  40 mg Subcutaneous Q24H  . fluticasone furoate-vilanterol  1 puff Inhalation Daily  . guaiFENesin  600 mg Oral BID  . ipratropium-albuterol  3 mL Nebulization Q4H  . levofloxacin (LEVAQUIN) IV  500 mg Intravenous Q24H  . LORazepam  0.5 mg Intravenous Once  . LORazepam  0.5 mg Oral TID PC  . methylPREDNISolone (SOLU-MEDROL) injection  40 mg Intravenous Q12H  . pantoprazole  40 mg Oral Daily  . potassium chloride SA  20 mEq Oral Daily  . QUEtiapine  300 mg Oral QHS  . sodium chloride flush  3 mL Intravenous Q12H  . sodium chloride flush  3 mL Intravenous Q12H  . tamsulosin  0.4 mg Oral QPC breakfast  . tiotropium  18 mcg Inhalation Daily  . venlafaxine XR  150 mg Oral Daily  . zolpidem  10 mg Oral QHS    Assessment/Plan:  1. Acute encephalopathy. MRI brain with and without contrast to rule out metastatic disease. Could also be secondary to steroids I will drop down the dose of the Solu-Medrol. 2. COPD exacerbation. Decrease Solu-Medrol to 40 mg IV every 12 hours and continue nebulizer treatments. Patient on Levaquin. 3. 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 Oncology consultation pending 4. Urinary retention. On Flomax. Patient does self catheterizations at home 5. Anxiety continue lorazepam 6. Hyperlipidemia unspecified on atorvastatin  Code Status:     Code Status Orders        Start     Ordered   08/19/15 1601  Full code   Continuous     08/19/15 1601    Code Status History    Date Active Date Inactive Code Status Order ID Comments User Context   10/24/2014 12:48 AM 10/25/2014  5:18 PM Full Code [id number]  Juluis Mire, MD Inpatient     Family Communication: Sister at bedside Disposition Plan: Home soon. I think I convinced him to stay today to get further testing.  Consultants:  Oncology  Antibiotics:  Levaquin  Time spent: 30 minutes  [place], [place]

## 2015-08-21 MED ORDER — SENNOSIDES-DOCUSATE SODIUM 8.6-50 MG PO TABS: 1.0000 | ORAL_TABLET | Freq: Two times a day (BID) | ORAL | Status: DC

## 2015-08-21 MED ORDER — FLUTICASONE-SALMETEROL 250-50 MCG/DOSE IN AEPB: 1.0000 | INHALATION_SPRAY | Freq: Two times a day (BID) | RESPIRATORY_TRACT | Status: DC

## 2015-08-21 MED ORDER — GUAIFENESIN ER 600 MG PO TB12: 600.0000 mg | ORAL_TABLET | Freq: Two times a day (BID) | ORAL | Status: AC

## 2015-08-21 MED ORDER — PREDNISONE 10 MG (21) PO TBPK: ORAL_TABLET | ORAL | Status: DC

## 2015-08-21 MED ORDER — IPRATROPIUM-ALBUTEROL 0.5-2.5 (3) MG/3ML IN SOLN: 3.0000 mL | Freq: Four times a day (QID) | RESPIRATORY_TRACT | Status: DC | PRN

## 2015-08-21 MED ORDER — LEVOFLOXACIN 500 MG PO TABS: 500.0000 mg | ORAL_TABLET | Freq: Every day | ORAL | Status: AC

## 2015-08-21 NOTE — Progress Notes (Signed)
3 L of oxygen. NSR. Pt reports no pain. Worked with PT and tolerated it well. IV and tele removed. Discharge instructions given to pt. Pt has no further concerns at this time.

## 2015-08-21 NOTE — Discharge Summary (Signed)
Timothy Ali, 82 y.o., DOB [birth year], MRN [id number]. Admission date: 08/19/2015 Discharge Date 08/21/2015 Primary MD Albina Billet, MD Admitting Physician Dustin Flock, MD  Admission Diagnosis  Elevated troponin [R79.89]  Discharge Diagnosis   Active Problems:   SOB (shortness of breath) acute on chronic COPD exasperation  acute encephalopathy likely due toHypoxia MRI negative for any CVA Metastatic lung cancer to the bone Chronic urinary retention self-catheterization to be continued at home Anxiety disorder Hyperlipidemia lEft-sided chest pain felt to be due to cancer Troponin elevated due to demand ischemia       Hospital Course Timothy Ali is a 82 y.o. male with a known history of Lung cancer currently followed by oncology who reports that he had shortness of breath for the past 30-35 days. He is chronically on oxygen at 3 L. However today his shortness of breath got worse. And he decided to come to the emergency room. He complains of productive cough as well as some wheezing intermittently. No fevers or chills. Patient has had intermittent left-sided chest pain as well. He denies any nausea vomiting or diarrhea. CT scan showed concern for progression of metastatic lung cancer as well as some infectious and inflammatory process. Patient was admitted to the hospital and treated for acute on chronic COPD exasperation with nebulizers and antibiotic  significant improvement in his symptoms.patient's family member also complained of some confusion. He had an MRI of the brain which showed no metastatic. He was recommended to decrease his antianxiety and 6 night medications however patient did not want to have these discontinued.            Consults  hematology/oncology  Significant Tests:  See full reports for all details      Dg Chest 2 View  08/19/2015  CLINICAL DATA:  Sibling reports patient having difficulty getting words out of mouth EXAM: CHEST  2 VIEW COMPARISON:   02/26/2015 FINDINGS: Normal heart size. There is no pleural effusion or edema identified. Chronic lung disease is identified bilaterally. Lateral left lung base opacity and left upper lobe perihilar opacity are identified which may be related to known lung cancer. The right lung is clear. IMPRESSION: 1. Left lung opacities are likely related to patient's known lung cancer and/or treatment related changes. 2. No acute findings identified Electronically Signed   By: Kerby Moors M.D.   On: 08/19/2015 12:46   Ct Head Wo Contrast  08/19/2015  CLINICAL DATA:  Slurred speech and headache; lung carcinoma EXAM: CT HEAD WITHOUT CONTRAST TECHNIQUE: Contiguous axial images were obtained from the base of the skull through the vertex without intravenous contrast. COMPARISON:  None. FINDINGS: There is mild diffuse atrophy. There is no intracranial mass, hemorrhage, extra-axial fluid collection, or midline shift. There is mild small vessel disease in the centra semiovale bilaterally. Elsewhere gray-white compartments appear normal. No acute infarct is evident. The bony calvarium appears intact although bones are osteoporotic. Mastoid air cells are clear. There is debris in each external auditory canal. No intraorbital lesions are evident in the visualized orbital regions. IMPRESSION: Mild atrophy with mild patchy periventricular small vessel disease. No intracranial mass, hemorrhage, or evidence of acute infarct. No intracranial edema. Probable cerumen in each external auditory canal. Electronically Signed   By: Lowella Grip III M.D.   On: 08/19/2015 12:52   Ct Angio Chest Pe W/cm &/or Wo Cm  08/19/2015  CLINICAL DATA:  Shortness of breath and feeling bad. History of lung cancer. EXAM: CT ANGIOGRAPHY CHEST WITH CONTRAST TECHNIQUE: Multidetector  CT imaging of the chest was performed using the standard protocol during bolus administration of intravenous contrast. Multiplanar CT image reconstructions and MIPs were obtained  to evaluate the vascular anatomy. CONTRAST:  75 mL Isovue 370 COMPARISON:  PET-CT 08/05/2015 FINDINGS: Mediastinum/Lymph Nodes: No evidence to suggest a pulmonary embolism. Limited evaluation of some of the upper pulmonary arteries due to motion artifact. Coronary artery calcifications. No significant pericardial effusion. Subcarinal tissue measures 1.5 cm in the short axis and previously measured 0.9 cm. No significant axillary lymphadenopathy. Lungs/Pleura: The trachea and mainstem bronchi are patent. There is chronic scarring and volume loss in the left upper lobe along the left major fissure. Diffuse emphysematous changes particularly in the left lower lobe. Again noted is a irregular pleural-based lesion in left lower lobe on sequence 6, image 114. Difficult to accurately measure this area due to adjacent parenchymal densities but lesion roughly measures 2.6 x 1.6 cm and previously measured 2.6 x 1.1 cm. Again noted is a pleural-based nodule in the posterior left upper lobe that previously measured 0.8 x 0.7 cm and now measures 0.9 x 1.0 cm. There are adjacent parenchymal densities in this area. There are increased parenchymal densities and interstitial thickening in the left upper lobe near the apex. New linear densities extending from the anterior chest may represent areas of atelectasis. Again noted are patchy pleural-based densities in the medial right lower lobe probably representing scarring. Upper abdomen: Images upper abdomen have some limitations due to motion artifact. No acute abnormalities in the upper abdomen. Musculoskeletal: Again noted is a soft tissue lesion involving the posterior left fourth rib. Difficult to identify the margins of this tumor but roughly measures 3.6 x 1.8 cm and minimally changed from the recent comparison examination. There is adjacent pleural thickening in this region. No clear evidence for a new bone lesion. Multilevel degenerative changes in the thoracic spine. Review  of the MIP images confirms the above findings. IMPRESSION: No evidence for pulmonary embolism. Study has some technical limitations due to motion artifact as described. Concern for progression of the metastatic lung disease demonstrated by slight enlargement of the pleural-based left lower lobe lesions and enlarging subcarinal lymph node. Again noted is a soft tissue mass involving the left fourth rib and posterior chest wall. Severe emphysematous disease with increased parenchymal densities or interstitial thickening at the left lung apex. This could represent an infectious or inflammatory process. There are additional patchy parenchymal densities in the superior segment in the left lower lobe which could be inflammatory as well. Electronically Signed   By: Markus Daft M.D.   On: 08/19/2015 15:20   Mr Jeri Cos MW Contrast  08/20/2015  CLINICAL DATA:  Patient with known history of lung cancer reports shortness of breath. Acute encephalopathy. EXAM: MRI HEAD WITHOUT AND WITH CONTRAST TECHNIQUE: Multiplanar, multiecho pulse sequences of the brain and surrounding structures were obtained without and with intravenous contrast. CONTRAST:  59m MULTIHANCE GADOBENATE DIMEGLUMINE 529 MG/ML IV SOLN COMPARISON:  CT head 08/19/2015. FINDINGS: The patient was unable to remain motionless for the exam. Small or subtle lesions could be overlooked. No evidence for acute infarction, hemorrhage, mass lesion, hydrocephalus, or extra-axial fluid. Generalized atrophy. Mild subcortical and periventricular T2 and FLAIR hyperintensities, likely chronic microvascular ischemic change. Pituitary, pineal, and cerebellar tonsils unremarkable. Cervical spondylosis. Mild to moderate pannus surrounds the odontoid. Flow voids are maintained throughout the carotid, basilar, and vertebral arteries. There are no areas of chronic hemorrhage. Post infusion, no abnormal enhancement of the brain or  meninges. Visualized calvarium, skull base, and upper  cervical osseous structures unremarkable. Scalp and extracranial soft tissues, sinuses, and mastoids show no acute process. BILATERAL cataract extraction. IMPRESSION: Atrophy and small vessel disease.  No acute intracranial findings. No features of metastatic disease are identified. Electronically Signed   By: Staci Righter M.D.   On: 08/20/2015 14:41   Nm Pet Image Restag (ps) Skull Base To Thigh  08/07/2015  CLINICAL DATA:  Subsequent Treatment strategy for lung cancer. EXAM: NUCLEAR MEDICINE PET SKULL BASE TO THIGH TECHNIQUE: 12.2 mCi F-18 FDG was injected intravenously. Full-ring PET imaging was performed from the skull base to thigh after the radiotracer. CT data was obtained and used for attenuation correction and anatomic localization. FASTING BLOOD GLUCOSE:  Value: 73 mg/dl COMPARISON:  06/01/2015 FINDINGS: NECK Tiny focus of focal FDG uptake in the posterior left parotid gland is stable nonspecific. CHEST The left paravertebral chest wall lesion is not substantially changed in the interval, measuring 1.9 x 3.5 cm today compared to 1.9 x 3.8 cm previously. Lesion remains hypermetabolic with SUV max = 76.7 today compared to 25.5 previously. The hypermetabolic left lower lobe subpleural nodule persists, stable in size at 8 mm with SUV max = 5.2 compared to 2.4 previously. In the interval since the prior study there has been interstitial and patchy airspace disease developed in both upper lobes which shows low level hypermetabolism with SUV max = 3-4. A new irregular nodular area of opacity in the inferior left lower lobe (image 125 series 3) is hypermetabolic with SUV max = 4.7. Emphysema is noted bilaterally. Scarring in the lingula is stable in shows low level FDG uptake. Airway impaction is seen in the right lower lobe similar to prior. ABDOMEN/PELVIS No abnormal hypermetabolic activity within the liver, pancreas, adrenal glands, or spleen. No hypermetabolic lymph nodes in the abdomen or pelvis. Stable  right renal cyst without evidence of hypermetabolism There is abdominal aortic atherosclerosis without aneurysm. SKELETON No focal hypermetabolic activity to suggest skeletal metastasis. IMPRESSION: 1. Continued further increase and hypermetabolic FDG uptake in the left posteromedial extrapleural chest wall lesion and left lower lobe nodule. There is a new irregular left lower lobe peripheral opacity which is hypermetabolic and may be infectious (pneumonia) but disease progression at this location is also possibility. 2. Interval development of interstitial and patchy opacity in the upper lungs with low level FDG uptake. Imaging features are probably related to infection/inflammation. Electronically Signed   By: Misty Stanley M.D.   On: 08/07/2015 16:33       Today   Subjective:   Timothy Ali  Feeling back   Objective:   Blood pressure 114/67, pulse 95, temperature 98.2 F (36.8 C), temperature source Oral, resp. rate 20, height '5\' 7"'$  (1.702 m), weight 59.421 kg (131 lb), SpO2 90 %.  .  Intake/Output Summary (Last 24 hours) at 08/21/15 1516 Last data filed at 08/21/15 0830  Gross per 24 hour  Intake    480 ml  Output    850 ml  Net   -370 ml    Exam VITAL SIGNS: Blood pressure 114/67, pulse 95, temperature 98.2 F (36.8 C), temperature source Oral, resp. rate 20, height '5\' 7"'$  (1.702 m), weight 59.421 kg (131 lb), SpO2 90 %.  GENERAL:  82 y.o.-year-old patient lying in the bed with no acute distress.  EYES: Pupils equal, round, reactive to light and accommodation. No scleral icterus. Extraocular muscles intact.  HEENT: Head atraumatic, normocephalic. Oropharynx and nasopharynx clear.  NECK:  Supple, no jugular venous distention. No thyroid enlargement, no tenderness.  LUNGS: Normal breath sounds bilaterally, no wheezing, rales,rhonchi or crepitation. No use of accessory muscles of respiration.  CARDIOVASCULAR: S1, S2 normal. No murmurs, rubs, or gallops.  ABDOMEN: Soft, nontender,  nondistended. Bowel sounds present. No organomegaly or mass.  EXTREMITIES: No pedal edema, cyanosis, or clubbing.  NEUROLOGIC: Cranial nerves II through XII are intact. Muscle strength 5/5 in all extremities. Sensation intact. Gait not checked.  PSYCHIATRIC: The patient is alert and oriented x 3.  SKIN: No obvious rash, lesion, or ulcer.   Data Review     CBC w Diff: Lab Results  Component Value Date   WBC 5.3 08/20/2015   WBC 10.7* 04/09/2014   HGB 10.5* 08/20/2015   HGB 11.1* 04/09/2014   HCT 30.5* 08/20/2015   HCT 33.5* 04/09/2014   PLT 163 08/20/2015   PLT 180 04/09/2014   LYMPHOPCT 10% 08/19/2015   LYMPHOPCT 8.2 04/09/2014   MONOPCT 14% 08/19/2015   MONOPCT 11.5 04/09/2014   EOSPCT 1% 08/19/2015   EOSPCT 0.2 04/09/2014   BASOPCT 0% 08/19/2015   BASOPCT 0.2 04/09/2014   CMP: Lab Results  Component Value Date   NA 139 08/20/2015   NA 141 04/09/2014   NA 142 04/02/2014   K 3.8 08/20/2015   K 4.2 04/09/2014   CL 102 08/20/2015   CL 104 04/09/2014   CO2 29 08/20/2015   CO2 30 04/09/2014   BUN 14 08/20/2015   BUN 18 04/09/2014   BUN 16 04/02/2014   CREATININE 0.77 08/20/2015   CREATININE 0.93 04/09/2014   CREATININE 0.8 04/02/2014   GLU 9 04/02/2014   PROT 6.7 08/06/2015   PROT 6.5 04/09/2014   ALBUMIN 2.9* 08/06/2015   ALBUMIN 2.4* 04/09/2014   BILITOT 0.2* 08/06/2015   BILITOT 0.4 04/09/2014   ALKPHOS 66 08/06/2015   ALKPHOS 65 04/09/2014   AST 18 08/06/2015   AST 33 04/09/2014   ALT 10* 08/06/2015   ALT 18 04/09/2014  .  Micro Results Recent Results (from the past 240 hour(s))  Urine culture     Status: Abnormal (Preliminary result)   Collection Time: 08/19/15  3:45 PM  Result Value Ref Range Status   Specimen Description URINE, CLEAN CATCH  Final   Special Requests Normal  Final   Culture (A)  Final    >=100,000 COLONIES/mL GRAM NEGATIVE RODS IDENTIFICATION AND SUSCEPTIBILITIES TO FOLLOW    Report Status PENDING  Incomplete     Code  Status History    Date Active Date Inactive Code Status Order ID Comments User Context   08/19/2015  4:01 PM 08/21/2015  2:32 PM Full Code [id number]  Dustin Flock, MD ED   10/24/2014 12:48 AM 10/25/2014  5:18 PM Full Code [id number]  Juluis Mire, MD Inpatient          Follow-up Information    Follow up with Albina Billet, MD On 08/28/2015.   Specialty:  Internal Medicine   Why:  2:30 pm df   Contact information:   [address]   [place] [address] [phone]       Follow up with Forest Gleason, MD On 09/03/2015.   Specialty:  Oncology   Why:  2:30 pm df   Contact information:   [place] [address] [phone]       Discharge Medications     Medication List    TAKE these medications  albuterol 108 (90 Base) MCG/ACT inhaler  Commonly known as:  PROVENTIL HFA;VENTOLIN HFA  Inhale 2 puffs into the lungs every 4 (four) hours as needed for wheezing or shortness of breath.     atorvastatin 40 MG tablet  Commonly known as:  LIPITOR  Take 40 mg by mouth at bedtime.     BREO ELLIPTA 100-25 MCG/INH Aepb  Generic drug:  fluticasone furoate-vilanterol  Inhale 1 puff into the lungs daily.     esomeprazole 40 MG capsule  Commonly known as:  NEXIUM  Take 40 mg by mouth daily.     guaiFENesin 600 MG 12 hr tablet  Commonly known as:  MUCINEX  Take 1 tablet (600 mg total) by mouth 2 (two) times daily.     ipratropium-albuterol 0.5-2.5 (3) MG/3ML Soln  Commonly known as:  DUONEB  Take 3 mLs by nebulization every 6 (six) hours as needed.     levofloxacin 500 MG tablet  Commonly known as:  LEVAQUIN  Take 1 tablet (500 mg total) by mouth daily.     LORazepam 1 MG tablet  Commonly known as:  ATIVAN  Take 1 mg by mouth every 8 (eight) hours. Pt takes with a 0.'5mg'$  tablet.     LORazepam 0.5 MG tablet  Commonly known as:  ATIVAN  Take 0.5 mg by mouth every 8 (eight) hours. Pt takes with a '1mg'$  tablet.     potassium chloride  SA 20 MEQ tablet  Commonly known as:  K-DUR,KLOR-CON  Take 1 tablet (20 mEq total) by mouth daily.     predniSONE 10 MG (21) Tbpk tablet  Commonly known as:  STERAPRED UNI-PAK 21 TAB  Start '50mg'$  taper by '10mg'$  until complete     QUEtiapine 300 MG 24 hr tablet  Commonly known as:  SEROQUEL XR  Take 1 tablet (300 mg total) by mouth at bedtime.     senna-docusate 8.6-50 MG tablet  Commonly known as:  Senokot-S  Take 1 tablet by mouth 2 (two) times daily.     tamsulosin 0.4 MG Caps capsule  Commonly known as:  FLOMAX  Take 0.4 mg by mouth daily after breakfast.     tiotropium 18 MCG inhalation capsule  Commonly known as:  SPIRIVA  Place 18 mcg into inhaler and inhale daily.     venlafaxine XR 150 MG 24 hr capsule  Commonly known as:  EFFEXOR-XR  Take 1 capsule (150 mg total) by mouth daily.     zolpidem 10 MG tablet  Commonly known as:  AMBIEN  Take 1 tablet (10 mg total) by mouth at bedtime.           Total Time in preparing paper work, data evaluation and todays exam - 35 minutes  Dustin Flock M.D on 08/21/2015 at 3:16 PM  [facility]   Office  [phone]

## 2015-08-21 NOTE — Discharge Instructions (Addendum)
°  DIET:  Cardiac diet  DISCHARGE CONDITION:  Stable  ACTIVITY:  Activity as tolerated with walker  OXYGEN:  Home Oxygen: Yes.     Oxygen Delivery: 3 liters/min via Patient connected to nasal cannula oxygen  DISCHARGE LOCATION:  home    ADDITIONAL DISCHARGE INSTRUCTION:   If you experience worsening of your admission symptoms, develop shortness of breath, life threatening emergency, suicidal or homicidal thoughts you must seek medical attention immediately by calling 911 or calling your MD immediately  if symptoms less severe.  You Must read complete instructions/literature along with all the possible adverse reactions/side effects for all the Medicines you take and that have been prescribed to you. Take any new Medicines after you have completely understood and accpet all the possible adverse reactions/side effects.   Please note  You were cared for by a hospitalist during your hospital stay. If you have any questions about your discharge medications or the care you received while you were in the hospital after you are discharged, you can call the unit and asked to speak with the hospitalist on call if the hospitalist that took care of you is not available. Once you are discharged, your primary care physician will handle any further medical issues. Please note that NO REFILLS for any discharge medications will be authorized once you are discharged, as it is imperative that you return to your primary care physician (or establish a relationship with a primary care physician if you do not have one) for your aftercare needs so that they can reassess your need for medications and monitor your lab values.

## 2015-08-21 NOTE — Care Management Note (Addendum)
Case Management Note  Patient Details  Name: Timothy Ali MRN: [id number] Date of Birth: Nov 22, 1929  Subjective/Objective:     CM consult for home health assessment. Met with patient at bedside. He lives alone. PCP Dr. Sondra Come. He is agreeable to SN and HHA. Presented list of home health agencies and he has no preference. Referral to [facility] for SN and HHA.  Denies issues obtaining medications, copays or transportation. Ambulatory with no assistive devices. Chronic home 02 at 2L.  Ordered nebulizer for home.  Requested family bring portable O2 tank for discharge home.             Action/Plan: SN and HHA with [facility]  Expected Discharge Date:    08/21/2015              Expected Discharge Plan:  [place]  In-House Referral:     Discharge planning Services  CM Consult  Post Acute Care Choice:  Home Health Choice offered to:  Patient  DME Arranged:    DME Agency:     HH Arranged:  RN, Nurse's Aide [place] Agency:  [facility]  Status of Service:  Completed, signed off  Medicare Important Message Given:    Date Medicare IM Given:    Medicare IM give by:    Date Additional Medicare IM Given:    Additional Medicare Important Message give by:     If discussed at [place] of Stay Meetings, dates discussed:    Additional Comments:  Jolly Mango, RN 08/21/2015, 9:37 AM

## 2015-08-21 NOTE — Progress Notes (Signed)
PT Cancellation Note  Patient Details Name: Timothy Ali MRN: [id number] DOB: 23-Jan-1930   Cancelled Treatment:    Reason Eval/Treat Not Completed: PT screened, no needs identified, will sign off; Spoke with RN, pt up walking in room without strength/balance conerns, MD cancelled PT consult; spoke with patient and patient has RW and cane at home to use as needed.  Will sign off as no current needs.  Ellarae Nevitt A Alexee Delsanto, PT 08/21/2015, 9:30 AM

## 2015-08-22 LAB — URINE CULTURE: Special Requests: NORMAL

## 2015-08-30 ENCOUNTER — Emergency Department
Admission: EM | Admit: 2015-08-30 | Discharge: 2015-09-01 | Disposition: A | Discharge status: Home / Self Care | Payer: Medicare HMO | Attending: Emergency Medicine | Admitting: Emergency Medicine

## 2015-08-30 ENCOUNTER — Emergency Department: Payer: Medicare HMO

## 2015-08-30 ENCOUNTER — Encounter: Payer: Self-pay | Admitting: Emergency Medicine

## 2015-08-30 DIAGNOSIS — F333 Major depressive disorder, recurrent, severe with psychotic symptoms: Secondary | ICD-10-CM | POA: Diagnosis not present

## 2015-08-30 DIAGNOSIS — J449 Chronic obstructive pulmonary disease, unspecified: Secondary | ICD-10-CM | POA: Diagnosis not present

## 2015-08-30 DIAGNOSIS — F0391 Unspecified dementia with behavioral disturbance: Secondary | ICD-10-CM | POA: Diagnosis not present

## 2015-08-30 DIAGNOSIS — R4182 Altered mental status, unspecified: Secondary | ICD-10-CM | POA: Diagnosis present

## 2015-08-30 DIAGNOSIS — Z85118 Personal history of other malignant neoplasm of bronchus and lung: Secondary | ICD-10-CM | POA: Diagnosis not present

## 2015-08-30 DIAGNOSIS — F411 Generalized anxiety disorder: Secondary | ICD-10-CM

## 2015-08-30 DIAGNOSIS — R410 Disorientation, unspecified: Secondary | ICD-10-CM | POA: Insufficient documentation

## 2015-08-30 DIAGNOSIS — Z79899 Other long term (current) drug therapy: Secondary | ICD-10-CM | POA: Insufficient documentation

## 2015-08-30 DIAGNOSIS — J45909 Unspecified asthma, uncomplicated: Secondary | ICD-10-CM | POA: Insufficient documentation

## 2015-08-30 DIAGNOSIS — Z880 Allergy status to penicillin: Secondary | ICD-10-CM | POA: Insufficient documentation

## 2015-08-30 DIAGNOSIS — Z87891 Personal history of nicotine dependence: Secondary | ICD-10-CM | POA: Diagnosis not present

## 2015-08-30 DIAGNOSIS — F0390 Unspecified dementia without behavioral disturbance: Secondary | ICD-10-CM

## 2015-08-30 LAB — CBC WITH DIFFERENTIAL/PLATELET
BASOS ABS: 0 10*3/uL (ref 0–0.1)
BASOS PCT: 1 %
EOS PCT: 2 %
Eosinophils Absolute: 0.1 10*3/uL (ref 0–0.7)
HCT: 33.3 % — ABNORMAL LOW (ref 40.0–52.0)
Hemoglobin: 11.1 g/dL — ABNORMAL LOW (ref 13.0–18.0)
Lymphocytes Relative: 11 %
Lymphs Abs: 0.7 10*3/uL — ABNORMAL LOW (ref 1.0–3.6)
MCH: 29.3 pg (ref 26.0–34.0)
MCHC: 33.3 g/dL (ref 32.0–36.0)
MCV: 88.1 fL (ref 80.0–100.0)
MONO ABS: 1 10*3/uL (ref 0.2–1.0)
Monocytes Relative: 16 %
Neutro Abs: 4.3 10*3/uL (ref 1.4–6.5)
Neutrophils Relative %: 70 %
PLATELETS: 186 10*3/uL (ref 150–440)
RBC: 3.78 MIL/uL — ABNORMAL LOW (ref 4.40–5.90)
RDW: 15.4 % — AB (ref 11.5–14.5)
WBC: 6.1 10*3/uL (ref 3.8–10.6)

## 2015-08-30 LAB — URINALYSIS COMPLETE WITH MICROSCOPIC (ARMC ONLY)
BILIRUBIN URINE: NEGATIVE
Bacteria, UA: NONE SEEN
Glucose, UA: NEGATIVE mg/dL
Hgb urine dipstick: NEGATIVE
Ketones, ur: NEGATIVE mg/dL
Nitrite: NEGATIVE
Protein, ur: NEGATIVE mg/dL
Specific Gravity, Urine: 1.014 (ref 1.005–1.030)
pH: 6 (ref 5.0–8.0)

## 2015-08-30 LAB — COMPREHENSIVE METABOLIC PANEL
ALT: 10 U/L — AB (ref 17–63)
AST: 13 U/L — AB (ref 15–41)
Albumin: 2.8 g/dL — ABNORMAL LOW (ref 3.5–5.0)
Alkaline Phosphatase: 64 U/L (ref 38–126)
Anion gap: 6 (ref 5–15)
BILIRUBIN TOTAL: 0.3 mg/dL (ref 0.3–1.2)
BUN: 22 mg/dL — AB (ref 6–20)
CO2: 29 mmol/L (ref 22–32)
CREATININE: 0.75 mg/dL (ref 0.61–1.24)
Calcium: 8.2 mg/dL — ABNORMAL LOW (ref 8.9–10.3)
Chloride: 103 mmol/L (ref 101–111)
GFR calc Af Amer: >60 mL/min (ref >60)
Glucose, Bld: 102 mg/dL — ABNORMAL HIGH (ref 65–99)
Potassium: 4.2 mmol/L (ref 3.5–5.1)
Sodium: 138 mmol/L (ref 135–145)
TOTAL PROTEIN: 6.8 g/dL (ref 6.5–8.1)

## 2015-08-30 LAB — AMMONIA

## 2015-08-30 LAB — ETHANOL: Alcohol, Ethyl (B): <5 mg/dL (ref <5)

## 2015-08-30 NOTE — BH Assessment (Signed)
Assessment Note  Timothy Ali is an 82 y.o. male. Timothy Ali arrived to the ED by way of personal vehicle.  He reports that he was very nervous, and felt uncomfortable. He reports feeling depressed and anxious.  He denied having auditory or visual hallucinations.  He denied suicidal or homicidal ideation or intent.  He reports that his memory has been good. He denied any recent changes or stressors. He currently resides by himself.  He denied the use of alcohol or drugs.  States [facility] to [facility] his doctor, but the office was closed today  Diagnosis:  Major Depressive disorder  Past Medical History:  Past Medical History  Diagnosis Date  . Major depressive disorder, recurrent, severe with psychotic features ([place])   . Persistent disorder of initiating or maintaining sleep   . Generalized anxiety disorder   . Major depressive disorder, recurrent, severe with psychotic features ([place])   . Persistent disorder of initiating or maintaining sleep   . Asthma   . Cancer ([place])     Left lung  . COPD (chronic obstructive pulmonary disease) [facility])     Past Surgical History  Procedure Laterality Date  . Hemorroidectomy    . Lung surgery      Family History:  Family History  Problem Relation Age of Onset  . COPD Mother   . Lung cancer Father   . Kidney disease Neg Hx   . Prostate cancer Neg Hx     Social History:  reports that he has quit smoking. He has never used smokeless tobacco. He reports that he does not drink alcohol or use illicit drugs.  Additional Social History:  Alcohol / Drug Use History of alcohol / drug use?: No history of alcohol / drug abuse  CIWA: CIWA-Ar BP: 135/70 mmHg Pulse Rate: 81 COWS:    Allergies:  Allergies  Allergen Reactions  . Penicillins Other (See Comments)    Reaction:  Unknown     Home Medications:  (Not in a hospital admission)  OB/GYN Status:  No LMP for male patient.  General Assessment Data Location of  Assessment: [facility] ED TTS Assessment: In system Is this a Tele or Face-to-Face Assessment?: Face-to-Face Is this an Initial Assessment or a Re-assessment for this encounter?: Initial Assessment Marital status: Widowed [place] name: n/a Is patient pregnant?: No Pregnancy Status: No Living Arrangements: Alone Can pt return to current living arrangement?: Yes Admission Status: Voluntary Is patient capable of signing voluntary admission?: Yes Referral Source: Self/Family/Friend Insurance type: Medicare  Medical Screening Exam ([place]) Medical Exam completed: Yes  Crisis Care Plan Living Arrangements: Alone Legal Guardian: Other: (Self) Name of Psychiatrist: None Name of Therapist: none  Education Status Is patient currently in school?: No Current Grade: n/a Highest grade of school patient has completed: 12th Name of school: [employer] person: n/a  Risk to self with the past 6 months Suicidal Ideation: No Has patient been a risk to self within the past 6 months prior to admission? : No Suicidal Intent: No Has patient had any suicidal intent within the past 6 months prior to admission? : No Is patient at risk for suicide?: No Suicidal Plan?: No Has patient had any suicidal plan within the past 6 months prior to admission? : No Access to Means: No What has been your use of drugs/alcohol within the last 12 months?: denied use Previous Attempts/Gestures: No How many times?: 0 Other Self Harm Risks: denied Triggers for Past Attempts:  None known Intentional Self Injurious Behavior: None Family Suicide History: No Recent stressful life event(s):  (deneid) Persecutory voices/beliefs?: No Depression: Yes Depression Symptoms: Despondent Substance abuse history and/or treatment for substance abuse?: No Suicide prevention information given to non-admitted patients: Not applicable  Risk to Others within the past 6 months Homicidal Ideation: No Does patient have any  lifetime risk of violence toward others beyond the six months prior to admission? : No Thoughts of Harm to Others: No Current Homicidal Intent: No Current Homicidal Plan: No Access to Homicidal Means: No Identified Victim: None identified History of harm to others?: No Assessment of Violence: None Noted Violent Behavior Description: denied Does patient have access to weapons?: No Criminal Charges Pending?: No Does patient have a court date: No Is patient on probation?: No  Psychosis Hallucinations: None noted Delusions: None noted  Mental Status Report Appearance/Hygiene: Unremarkable Eye Contact: Fair Motor Activity: Unremarkable Speech: Soft Level of Consciousness: Quiet/awake Mood: Depressed Affect: Flat Anxiety Level: Moderate Thought Processes: Unable to Assess Judgement: Unimpaired Orientation: Person, Place, Situation Obsessive Compulsive Thoughts/Behaviors: None  Cognitive Functioning Concentration: Normal Memory: Recent Intact IQ: Average Insight: Fair Impulse Control: Fair Appetite: Fair Sleep: No Change Vegetative Symptoms: None  ADLScreening [facility] Assessment Services) Patient's cognitive ability adequate to safely complete daily activities?: Yes Patient able to express need for assistance with ADLs?: Yes Independently performs ADLs?: Yes (appropriate for developmental age)  Prior Inpatient Therapy Prior Inpatient Therapy: No Prior Therapy Dates: n/a Prior Therapy Facilty/Provider(s): n/a Reason for Treatment: n/a  Prior Outpatient Therapy Prior Outpatient Therapy: No Prior Therapy Dates: n/a Prior Therapy Facilty/Provider(s): n/a Reason for Treatment: n/a Does patient have an ACCT team?: No Does patient have Intensive In-House Services?  : No Does patient have [facility] services? : No Does patient have P4CC services?: No  ADL Screening (condition at time of admission) Patient's cognitive ability adequate to safely complete daily activities?:  Yes Patient able to express need for assistance with ADLs?: Yes Independently performs ADLs?: Yes (appropriate for developmental age)       Abuse/Neglect Assessment (Assessment to be complete while patient is alone) Physical Abuse: Denies Verbal Abuse: Denies Sexual Abuse: Denies Exploitation of patient/patient's resources: Denies Self-Neglect: Denies     Regulatory affairs officer (For Healthcare) Does patient have an advance directive?: No    Additional Information 1:1 In Past 12 Months?: No CIRT Risk: No Elopement Risk: No Does patient have medical clearance?: Yes     Disposition:  Disposition Initial Assessment Completed for this Encounter: Yes Disposition of Patient: Outpatient treatment  On Site Evaluation by:   Reviewed with Physician:    Elmer Bales 08/30/2015 10:17 PM

## 2015-08-30 NOTE — ED Notes (Signed)
Pt calling out. This RN went in to check on pt. Asking for shirt and jacket. Explained to pt that he could not wear long sleeve shirt and jacket over gown due to IV and monitor leads. Pt verbalized understanding. Pt asked where he was. Reoriented pt to location and time. Pt asking for food. Provided pt with warm blanket because he was cold and gave pt sandwich tray and something to drink.

## 2015-08-30 NOTE — ED Notes (Signed)
Spoke with provider and per provider it's okay to take pt off monitor since he will be staying with [place] overnight and his vital signs have been stable. Will continue to check vitals q8h.

## 2015-08-30 NOTE — ED Provider Notes (Signed)
-----------------------------------------   10:09 PM on 08/30/2015 -----------------------------------------   Blood pressure 135/70, pulse 81, temperature 98.1 F (36.7 C), temperature source Oral, resp. rate 24, height 6' (1.829 m), weight 136 lb (61.689 kg), SpO2 92 %.  Assuming care from Dr. Burlene Arnt.  In short, Timothy Ali is a 82 y.o. male with a chief complaint of Altered Mental Status .  Refer to the original H&P for additional details.  The current plan of care is to follow up as patient's progress. The patient apparently has what sounds like early dementia and difficulty making decisions yet still wants to remain independent. There was concerns about his cognitive skills for him to live independently. Daily activities such as driving, etc. Patient will be seen by psychiatry for geropsychiatric evaluation. He essentially been cleared from a medical standpoint. The patient and his family have agreed to stay for the psychiatric evaluation we will continue all other court orders etc.   Daymon Larsen, MD 08/30/15 2210

## 2015-08-30 NOTE — ED Provider Notes (Addendum)
[facility] Emergency Department Provider Note  ____________________________________________   I have reviewed the triage vital signs and the nursing notes.   HISTORY  Chief Complaint Altered Mental Status    HPI Timothy Ali is a 82 y.o. male who has notes she ate complaint. He states he feels pretty good. He was here trying to get some prescription fills and someone thought he was somewhat confused as he was on the grounds of the hospital and sent him to the emergency room. He denies any closed head injury. Patient apparently was here trying to get prescriptions filled for his psychiatric medications. Patient has prescriptions with him for Effexor lorazepam and Seroquel. Is unclear when he stopped taking them. Patient states he feels somewhat depressed but denies any SI or HI. Phone call the family reveals that the patient has been getting progressively more confused over the last 2 weeks or so. Patient does apparently live alone and drove himself here. Family will come in and provide more history      Past Medical History  Diagnosis Date  . Major depressive disorder, recurrent, severe with psychotic features ([place])   . Persistent disorder of initiating or maintaining sleep   . Generalized anxiety disorder   . Major depressive disorder, recurrent, severe with psychotic features ([place])   . Persistent disorder of initiating or maintaining sleep   . Asthma   . Cancer ([place])     Left lung  . COPD (chronic obstructive pulmonary disease) [facility])     Patient Active Problem List   Diagnosis Date Noted  . SOB (shortness of breath) 08/19/2015  . BPH (benign prostatic hypertrophy) with urinary retention 02/16/2015  . Metastatic lung cancer (metastasis from lung to other site) ([place]) 02/16/2015  . Lung cancer ([place]) 10/24/2014  . Acute on chronic respiratory failure ([place]) 10/23/2014  . Leucocytosis 10/23/2014  . COPD (chronic obstructive pulmonary disease)  ([place]) 10/23/2014  . Carcinoma, lung ([place]) 10/23/2014  . Cancer of lung ([place]) 10/23/2014  . CAFL (chronic airflow limitation) ([place]) 08/01/2014  . Depression, major, recurrent, severe with psychosis ([place]) 08/01/2014  . H/O gastrointestinal disease 08/01/2014  . Anxiety, generalized 08/01/2014  . H/O elevated lipids 08/01/2014  . Insomnia, persistent 08/01/2014  . H/O malignant neoplasm 08/01/2014  . Prostate disease 08/01/2014  . Macular degeneration 08/01/2014  . Obstruction of urinary tract 08/01/2014  . Chronic obstructive pulmonary disease ([place]) 08/01/2014  . Severe episode of recurrent major depressive disorder, with psychotic features ([employer]) 08/01/2014    Past Surgical History  Procedure Laterality Date  . Hemorroidectomy    . Lung surgery      Current Outpatient Rx  Name  Route  Sig  Dispense  Refill  . albuterol (PROVENTIL HFA;VENTOLIN HFA) 108 (90 BASE) MCG/ACT inhaler   Inhalation   Inhale 2 puffs into the lungs every 4 (four) hours as needed for wheezing or shortness of breath.          Marland Kitchen atorvastatin (LIPITOR) 40 MG tablet   Oral   Take 40 mg by mouth at bedtime.          Marland Kitchen BREO ELLIPTA 100-25 MCG/INH AEPB   Inhalation   Inhale 1 puff into the lungs daily.            Dispense as written.   Marland Kitchen esomeprazole (NEXIUM) 40 MG capsule   Oral   Take 40 mg by mouth daily.          Marland Kitchen guaiFENesin (MUCINEX) 600 MG  12 hr tablet   Oral   Take 1 tablet (600 mg total) by mouth 2 (two) times daily.   20 tablet   0   . ipratropium-albuterol (DUONEB) 0.5-2.5 (3) MG/3ML SOLN   Nebulization   Take 3 mLs by nebulization every 6 (six) hours as needed.   360 mL   1   . LORazepam (ATIVAN) 0.5 MG tablet   Oral   Take 0.5 mg by mouth every 8 (eight) hours. Pt takes with a '1mg'$  tablet.         Marland Kitchen LORazepam (ATIVAN) 1 MG tablet   Oral   Take 1 mg by mouth every 8 (eight) hours. Pt takes with a 0.'5mg'$  tablet.         . potassium chloride SA (K-DUR,KLOR-CON) 20 MEQ  tablet   Oral   Take 1 tablet (20 mEq total) by mouth daily.   30 tablet   3   . predniSONE (STERAPRED UNI-PAK 21 TAB) 10 MG (21) TBPK tablet      Start '50mg'$  taper by '10mg'$  until complete   21 tablet   0   . QUEtiapine (SEROQUEL XR) 300 MG 24 hr tablet   Oral   Take 1 tablet (300 mg total) by mouth at bedtime.   90 tablet   1     HOLD FILLING UNTIL PATIENT CALLS FOR REFILL.   Marland Kitchen senna-docusate (SENOKOT-S) 8.6-50 MG tablet   Oral   Take 1 tablet by mouth 2 (two) times daily.   30 tablet   0   . tamsulosin (FLOMAX) 0.4 MG CAPS capsule   Oral   Take 0.4 mg by mouth daily after breakfast.          . tiotropium (SPIRIVA) 18 MCG inhalation capsule   Inhalation   Place 18 mcg into inhaler and inhale daily.          Marland Kitchen venlafaxine XR (EFFEXOR-XR) 150 MG 24 hr capsule   Oral   Take 1 capsule (150 mg total) by mouth daily.   90 capsule   1     HOLD FILLING UNTIL PATIENT CALLS FOR REFILL.   Marland Kitchen zolpidem (AMBIEN) 10 MG tablet   Oral   Take 1 tablet (10 mg total) by mouth at bedtime.   90 tablet   1     Allergies Penicillins  Family History  Problem Relation Age of Onset  . COPD Mother   . Lung cancer Father   . Kidney disease Neg Hx   . Prostate cancer Neg Hx     Social History Social History  Substance Use Topics  . Smoking status: Former Research scientist (life sciences)  . Smokeless tobacco: Never Used     Comment: quit 18 years ago  . Alcohol Use: No    Review of Systems Constitutional: No fever/chills Eyes: No visual changes. ENT: No sore throat. No stiff neck no neck pain Cardiovascular: Denies chest pain. Respiratory: Denies shortness of breath. Gastrointestinal:   no vomiting.  No diarrhea.  No constipation. Genitourinary: Negative for dysuria. Musculoskeletal: Negative lower extremity swelling Skin: Negative for rash. Neurological: Negative for headaches, focal weakness or numbness. 10-point ROS otherwise  negative.  ____________________________________________   PHYSICAL EXAM:  VITAL SIGNS: ED Triage Vitals  Enc Vitals Group     BP 08/30/15 1350 139/71 mmHg     Pulse Rate 08/30/15 1350 83     Resp 08/30/15 1350 17     Temp 08/30/15 1350 98.1 F (36.7 C)     Temp Source  08/30/15 1350 Oral     SpO2 08/30/15 1350 93 %     Weight 08/30/15 1350 136 lb (61.689 kg)     Height 08/30/15 1350 6' (1.829 m)     Head Cir --      Peak Flow --      Pain Score 08/30/15 1351 0     Pain Loc --      Pain Edu? --      Excl. in [place]? --     Constitutional: Alert and oriented To name and "hospital" unsure of the date.. Well appearing and in no acute distress  Eyes: Conjunctivae are normal. PERRL. EOMI. Head: Atraumatic. Nose: No congestion/rhinnorhea. Mouth/Throat: Mucous membranes are moist.  Oropharynx non-erythematous. Neck: No stridor.   Nontender with no meningismus Cardiovascular: Normal rate, regular rhythm. Grossly normal heart sounds.  Good peripheral circulation. Respiratory: Normal respiratory effort.  No retractions. Lungs CTAB. Abdominal: Soft and nontender. No distention. No guarding no rebound Back:  There is no focal tenderness or step off there is no midline tenderness there are no lesions noted. there is no CVA tenderness Musculoskeletal: No lower extremity tenderness. No joint effusions, no DVT signs strong distal pulses no edema Neurologic:  Normal speech and language. No gross focal neurologic deficits are appreciated.  Skin:  Skin is warm, dry and intact. No rash noted. Psychiatric: Mood and affect are normal. Speech and behavior are normal.  ____________________________________________   LABS (all labs ordered are listed, but only abnormal results are displayed)  Labs Reviewed  URINALYSIS COMPLETEWITH MICROSCOPIC ([place])  COMPREHENSIVE METABOLIC PANEL  AMMONIA  CBC WITH DIFFERENTIAL/PLATELET  ETHANOL   ____________________________________________  EKG  I  personally interpreted any EKGs ordered by me or triage  ____________________________________________  RADIOLOGY  I reviewed any imaging ordered by me or triage that were performed during my shift and, if possible, patient and/or family made aware of any abnormal findings. ____________________________________________   PROCEDURES  Procedure(s) performed: None  Critical Care performed: None  ____________________________________________   INITIAL IMPRESSION / ASSESSMENT AND PLAN / ED COURSE  Pertinent labs & imaging results that were available during my care of the patient were reviewed by me and considered in my medical decision making (see chart for details).  Patient here apparently suffering from some degree of confusion over the last few weeks of time. We'll check ammonia level basic blood work chest x-ray as he suffers from lung cancer he is not significantly hypoxic at this time, does not appear to be retaining CO2 is awake and alert, we will await further investigation of this issue until his family arrives.  ----------------------------------------- 3:36 PM on 08/30/2015 -----------------------------------------  Workup unremarkable patient satting 93% on room air when he is normally on 3 L at home. There is no evidence of any acute decompensated psychiatric or medical issue today however he does have some degree of baseline confusion and he lives by himself. Dr. Doy Hutching is been kind enough to consult for the hospitalist service about disposition. Apparently there is outpatient home health care coming to see him starting tomorrow. We will await medicine input, signed out to Dr. Marcelene Butte.  ----------------------------------------- 3:41 PM on 08/30/2015 -----------------------------------------  Dr. Doy Hutching and offer the patient and family admission to the hospital for observation and possible placement but they refuse. We will have psychiatry evaluate the patient because he  states he would like help with his "nerves". We did explain to the family that they should not let him drive. Awaiting psychiatric consult.  ____________________________________________   FINAL CLINICAL IMPRESSION(S) / ED DIAGNOSES  Final diagnoses:  None      This chart was dictated using voice recognition software.  Despite best efforts to proofread,  errors can occur which can change meaning.     Schuyler Amor, MD 08/30/15 1421  Schuyler Amor, MD 08/30/15 [place], MD 08/30/15 [place], MD 08/30/15 [phone]

## 2015-08-30 NOTE — ED Notes (Signed)
Family asking when psychiatry will see patient. Explained that I could not provide a specific time when they would be around to see patient. TTS just called this RN and said that Dr. Weber Cooks had seen another pt in the department and should be around to see this pt shortly. Family updated.

## 2015-08-30 NOTE — ED Notes (Signed)
This nurse given report that patient was found walking around medical mall by staff member who seemed confused.  Report that patient trying to fill prescriptions.  Upon arrival to room patient is oriented to self and time, disoriented to place.  No known injuries or falls.  Pt states lives alone and drove self to hospital.  Nurse spoke with Loletha Carrow, patient's sister, on phone who states patient has confusion and has been worsening over last month.  Pt sees Dr. Leonides Schanz for psychiatry, family feels patient is "taking too many meds".  Pt presents in no pain and in NAD at this time.

## 2015-08-30 NOTE — ED Notes (Signed)
Sisters Philemon Kingdom (home number [phone]) and Liana Crocker (cell number [phone]) left to go home. Both would like to be called if there are any changes or if the psychiatrist need to talk to them or has any questions for them. Vaughan Basta lives close by and can be here fairly quickly if the psychiatrist would like to talk with her in person.

## 2015-08-31 DIAGNOSIS — F0390 Unspecified dementia without behavioral disturbance: Secondary | ICD-10-CM

## 2015-08-31 DIAGNOSIS — F411 Generalized anxiety disorder: Secondary | ICD-10-CM

## 2015-08-31 DIAGNOSIS — F0391 Unspecified dementia with behavioral disturbance: Secondary | ICD-10-CM

## 2015-08-31 LAB — URINE DRUG SCREEN, QUALITATIVE (ARMC ONLY)
Amphetamines, Ur Screen: NOT DETECTED
BARBITURATES, UR SCREEN: NOT DETECTED
Benzodiazepine, Ur Scrn: POSITIVE — AB
CANNABINOID 50 NG, UR ~~LOC~~: NOT DETECTED
COCAINE METABOLITE, UR ~~LOC~~: NOT DETECTED
MDMA (Ecstasy)Ur Screen: NOT DETECTED
Methadone Scn, Ur: NOT DETECTED
OPIATE, UR SCREEN: NOT DETECTED
Phencyclidine (PCP) Ur S: NOT DETECTED
Tricyclic, Ur Screen: NOT DETECTED

## 2015-08-31 MED ORDER — IPRATROPIUM-ALBUTEROL 0.5-2.5 (3) MG/3ML IN SOLN: 3.0000 mL | Freq: Once | RESPIRATORY_TRACT | Status: DC

## 2015-08-31 MED ORDER — IPRATROPIUM-ALBUTEROL 0.5-2.5 (3) MG/3ML IN SOLN
3.0000 mL | Freq: Four times a day (QID) | RESPIRATORY_TRACT | Status: DC | PRN
  Administered 2015-08-31: 3 mL via RESPIRATORY_TRACT

## 2015-08-31 MED ORDER — LORAZEPAM 0.5 MG PO TABS
0.5000 mg | ORAL_TABLET | Freq: Three times a day (TID) | ORAL | Status: DC | PRN
  Administered 2015-08-31: 0.5 mg via ORAL

## 2015-08-31 MED ORDER — VENLAFAXINE HCL ER 75 MG PO CP24
150.0000 mg | ORAL_CAPSULE | Freq: Every day | ORAL | Status: DC
  Filled 2015-08-31: qty 1
  Filled 2015-08-31: qty 2
  Filled 2015-08-31: qty 1

## 2015-08-31 MED ORDER — ATORVASTATIN CALCIUM 20 MG PO TABS: 40.0000 mg | ORAL_TABLET | Freq: Every day | ORAL | Status: DC

## 2015-08-31 MED ORDER — QUETIAPINE FUMARATE ER 50 MG PO TB24
300.0000 mg | ORAL_TABLET | Freq: Every day | ORAL | Status: DC
  Administered 2015-08-31: 300 mg via ORAL
  Filled 2015-08-31: qty 6

## 2015-08-31 MED ORDER — TIOTROPIUM BROMIDE MONOHYDRATE 18 MCG IN CAPS
18.0000 ug | ORAL_CAPSULE | Freq: Every day | RESPIRATORY_TRACT | Status: DC
  Filled 2015-08-31: qty 5

## 2015-08-31 MED ORDER — TAMSULOSIN HCL 0.4 MG PO CAPS
0.4000 mg | ORAL_CAPSULE | Freq: Every day | ORAL | Status: DC
  Administered 2015-08-31: 0.4 mg via ORAL
  Filled 2015-08-31: qty 1

## 2015-08-31 MED ORDER — LORAZEPAM 0.5 MG PO TABS
ORAL_TABLET | ORAL | Status: AC
  Administered 2015-08-31: 0.5 mg via ORAL
  Filled 2015-08-31: qty 1

## 2015-08-31 MED ORDER — IPRATROPIUM-ALBUTEROL 0.5-2.5 (3) MG/3ML IN SOLN
RESPIRATORY_TRACT | Status: AC
  Administered 2015-08-31: 3 mL via RESPIRATORY_TRACT
  Filled 2015-08-31: qty 3

## 2015-08-31 NOTE — Discharge Instructions (Signed)
Please return to the emergency department for increased confusion, or any other symptoms concerning to you.   Confusion Confusion is the inability to think with your usual speed or clarity. Confusion may come on quickly or slowly over time. How quickly the confusion comes on depends on the cause. Confusion can be due to any number of causes. CAUSES   Concussion, head injury, or head trauma.  Seizures.  Stroke.  Fever.  Brain tumor.  Age related decreased brain function (dementia).  Heightened emotional states like rage or terror.  Mental illness in which the person loses the ability to determine what is real and what is not (hallucinations).  Infections such as a urinary tract infection (UTI).  Toxic effects from alcohol, drugs, or prescription medicines.  Dehydration and an imbalance of salts in the body (electrolytes).  Lack of sleep.  Low blood sugar (diabetes).  Low levels of oxygen from conditions such as chronic lung disorders.  Drug interactions or other medicine side effects.  Nutritional deficiencies, especially niacin, thiamine, vitamin C, or vitamin B.  Sudden drop in body temperature (hypothermia).  Change in routine, such as when traveling or hospitalized. SIGNS AND SYMPTOMS  People often describe their thinking as cloudy or unclear when they are confused. Confusion can also include feeling disoriented. That means you are unaware of where or who you are. You may also not know what the date or time is. If confused, you may also have difficulty paying attention, remembering, and making decisions. Some people also act aggressively when they are confused.  DIAGNOSIS  The medical evaluation of confusion may include:  Blood and urine tests.  X-rays.  Brain and nervous system tests.  Analyzing your brain waves (electroencephalogram or EEG).  Magnetic resonance imaging (MRI) of your head.  Computed tomography (CT) scan of your head.  Mental status tests  in which your health care provider may ask many questions. Some of these questions may seem silly or strange, but they are a very important test to help diagnose and treat confusion. TREATMENT  An admission to the hospital may not be needed, but a person with confusion should not be left alone. Stay with a family member or friend until the confusion clears. Avoid alcohol, pain relievers, or sedative drugs until you have fully recovered. Do not drive until directed by your health care provider. HOME CARE INSTRUCTIONS  What family and friends can do:  To find out if someone is confused, ask the person to state his or her name, age, and the date. If the person is unsure or answers incorrectly, he or she is confused.  Always introduce yourself, no matter how well the person knows you.  Often remind the person of his or her location.  Place a calendar and clock near the confused person.  Help the person with his or her medicines. You may want to use a pill box, an alarm as a reminder, or give the person each dose as prescribed.  Talk about current events and plans for the day.  Try to keep the environment calm, quiet, and peaceful.  Make sure the person keeps follow-up visits with his or her health care provider. PREVENTION  Ways to prevent confusion:  Avoid alcohol.  Eat a balanced diet.  Get enough sleep.  Take medicine only as directed by your health care provider.  Do not become isolated. Spend time with other people and make plans for your days.  Keep careful watch on your blood sugar levels if you  are diabetic. SEEK IMMEDIATE MEDICAL CARE IF:   You develop severe headaches, repeated vomiting, seizures, blackouts, or slurred speech.  There is increasing confusion, weakness, numbness, restlessness, or personality changes.  You develop a loss of balance, have marked dizziness, feel uncoordinated, or fall.  You have delusions, hallucinations, or develop severe anxiety.  Your  family members think you need to be rechecked.   This information is not intended to replace advice given to you by your health care provider. Make sure you discuss any questions you have with your health care provider.   Document Released: 05/12/2004 Document Revised: 04/25/2014 Document Reviewed: 05/10/2013 Elsevier Interactive Patient Education [employer].

## 2015-08-31 NOTE — ED Notes (Signed)
BEHAVIORAL HEALTH ROUNDING Patient sleeping: No. Patient alert and oriented: yes Behavior appropriate: Yes.  ; If no, describe:  Nutrition and fluids offered: Yes  Toileting and hygiene offered: Yes  Sitter present: not applicable Law enforcement present: Yes  

## 2015-08-31 NOTE — ED Notes (Signed)
Patient given straight cath set for self cath. Unable to get catheter to drain per patient. Allows myself to straight cath with sterile technique. 557m clear yellow urine obtained.

## 2015-08-31 NOTE — ED Notes (Signed)

## 2015-08-31 NOTE — ED Notes (Signed)
Pt given kit for self cath, hx of BPH

## 2015-08-31 NOTE — ED Provider Notes (Signed)
I spoke with Dr. Weber Cooks, psychiatrist, who is looking into having the mental health case manager contact family tip pick him up. It does not appear that he needs inpatient psychiatric ward Ger[facility] psych evaluation at this point time.  We did discuss adding on urine drug screen, and the lab was able to add onto the urine yesterday and it was found to be positive for benzodiazepine, but this does not necessarily change acute management.  Disposition upon obtaining family member who can pick this patient up, to be arranged with mental health case manager.  Lisa Roca, MD 08/31/15 [phone]

## 2015-08-31 NOTE — ED Notes (Signed)
Patent assisted with self cath while sitting on toilet. Good drainage. Pt feels relief of urgency. To bed to take a nap.

## 2015-08-31 NOTE — Progress Notes (Signed)
TTS has spoken with Joanna Puff the pts sister @ [phone]. She was made aware that the pt is being prepared for discharge. Writer suggested that herself or a family member be with the pt at the time of discharge. That pts sister states that the pts car is located here at the hospital and that he should be capable of driving himself home. See went on to mention that she is usually out of town ([place]) and that the pts daughter should be listed as his emergency contact. Pt daughter Dimple Casey- [phone].   Writer has attempted to contact the pts daughter Ms. Sharp, No answer.    08/31/2015 Con Memos, MS, [place], [facility] Therapeutic Triage Specialist

## 2015-08-31 NOTE — ED Notes (Signed)
Patient requested to be cath. Patient stated he felt a lot better afterwards and we got him clean underwear and clothes. Also performed perineal care. Changed bed linens and provided fresh warm blankets. Patient resting comfortably now

## 2015-08-31 NOTE — ED Notes (Signed)
Patient questions what the plan is for today. Informed will be seeing counselor and evaluated if he needs more help than he is getting. Patient tells me he lives alone in [address] in the [place]. States he feels he is able to care for himself well. States he does some cooking and that he eats breakfast out. States he does not receive help from the apartment or from his family. Patient does state his car is parked in parking lot. Let patient know his sisters moved his car to keep it safe.

## 2015-08-31 NOTE — ED Notes (Signed)
Established contact with pt's sister, Philemon Kingdom ([phone]) regarding pt's d/c needs. Informed sister that pt needs his portable O2 machine to safely be d/c'd and drive himself home. Vaughan Basta stated that she was unable to obtain the machine from pt's home because "he wants nothing to do with me..he's being ugly" and would be unable to offer her assistance in the matter.

## 2015-08-31 NOTE — ED Notes (Signed)
BEHAVIORAL HEALTH ROUNDING Patient sleeping: No. Patient alert and oriented: no Behavior appropriate: Yes.  ; If no, describe:  Nutrition and fluids offered: Yes  Toileting and hygiene offered: Yes  Sitter present: not applicable Law enforcement present: Yes

## 2015-08-31 NOTE — ED Notes (Signed)
O2 applied, garage door up

## 2015-08-31 NOTE — ED Notes (Signed)
Established contact with pt's other sister, Liana Crocker ([phone]). When informed of pt's need of his O2 equipment for d/c, she stated she had no way of getting into the pt's residence to retrieve it. When I informed her that the pt would give permission for the sister to come by the ED to collect his house keys to gain entry into the house to bring him his equipment, sister hesitated and stated it would be impossible for her to do so and that it would "have to be tomorrow during the day because I can't drive at night".

## 2015-08-31 NOTE — Consult Note (Signed)
[facility] Consult   Reason for Consult:  Consult for this 82 year old man who was escorted to the emergency room after being found confused wandering in the hospital yesterday Referring Physician:  McShane Patient Identification: Timothy Ali MRN:  [id number] Principal Diagnosis: Dementia Diagnosis:   Patient Active Problem List   Diagnosis Date Noted  . Dementia [F03.90] 08/31/2015  . Generalized anxiety disorder [F41.1] 08/31/2015  . SOB (shortness of breath) [R06.02] 08/19/2015  . BPH (benign prostatic hypertrophy) with urinary retention [N40.0] 02/16/2015  . Metastatic lung cancer (metastasis from lung to other site) ([place]) [C34.90] 02/16/2015  . Lung cancer ([place]) [C34.90] 10/24/2014  . Acute on chronic respiratory failure ([place]) [J96.20] 10/23/2014  . Leucocytosis [D72.829] 10/23/2014  . COPD (chronic obstructive pulmonary disease) ([place]) [J44.9] 10/23/2014  . Carcinoma, lung ([place]) [C34.90] 10/23/2014  . Cancer of lung ([place]) [C34.90] 10/23/2014  . CAFL (chronic airflow limitation) ([place]) [J44.9] 08/01/2014  . Depression, major, recurrent, severe with psychosis ([place]) [F33.3] 08/01/2014  . H/O gastrointestinal disease [Z87.19] 08/01/2014  . Anxiety, generalized [F41.1] 08/01/2014  . H/O elevated lipids [Z86.39] 08/01/2014  . Insomnia, persistent [G47.00] 08/01/2014  . H/O malignant neoplasm [Z85.9] 08/01/2014  . Prostate disease [N42.9] 08/01/2014  . Macular degeneration [H35.30] 08/01/2014  . Obstruction of urinary tract [N13.9] 08/01/2014  . Chronic obstructive pulmonary disease ([place]) [J44.9] 08/01/2014  . Severe episode of recurrent major depressive disorder, with psychotic features ([place]) [F33.3] 08/01/2014    Total Time spent with patient: 1 hour  Subjective:   Timothy Ali is a 82 y.o. male patient admitted with "can I just go home, I swear I won't hurt anyone".  HPI:  Patient interviewed. Chart reviewed including previous notes from outpatient  psychiatrist and medical notes. Labs and vitals reviewed. Looked up his prescription refills and the controlled substance database. This 82 year old man apparently was wandering in the hospital yesterday looking confused. He reportedly said that he was trying to find his doctor's office at some point. This was clearly inappropriate as it was Sunday. He tells me that he was trying to get his prescription filled. This also is hard to make sense of since he usually gets his prescriptions filled at a primary pharmacy and there was no place to get prescriptions filled in the hospital yesterday. Patient swears to me that he has been taking all of his prescribed medicine including his lorazepam exactly as prescribed right up until yesterday but that he needed to get his prescriptions filled. He says he has chronic anxiety and is feeling very nervous today but is not able to give a clear history of any recent worsening of his nerves or mood. He says he sleeps restlessly at night. Tries to eat appropriately. He denies any hallucinations. He denies any suicidal or homicidal ideation. Patient says he has some awareness of having some memory problems but doesn't think it has been severe and says he has been continuing to live by himself and drive independently. I note that his outpatient psychiatrist recently changed in the spring time. He had previously been seeing Dr. Jimmye Norman and was on 0.5 mg of lorazepam 3 times a day +1-2 mg at night for sleep. At his recent appointment with Dr. Einar Grad she had asked him to switch to only taking the 0.5 mg of lorazepam 3 times a day. Patient should've had enough of his old prescription to still be taking it regularly. He had not gone to get his new prescriptions filled. I don't believe she changed his  Seroquel or Effexor doses. Patient denies any alcohol use or any other drug use.  Social history: Patient lives by himself in an apartment. Still drives. Has 2 adult children who apparently  at least occasionally stay in touch with him. There is a note from TTS reporting that there was a plan to start home health for him today.  Medical history: Multiple medical problems including lung carcinoma with metastases currently receiving chemotherapy, chronic airflow in breathing problems, history of prostate cancer history of macular degeneration COPD.  Substance abuse history: Patient states he used to have a drinking problem but stopped over 30 years ago. Hasn't had any alcohol since then. Denies that he's ever abused any other drugs.  Past Psychiatric History: Patient carries a past history in his chart of age or depression with psychotic features. I don't find any written documentation to support that. Patient says he has been treated for nerves for 50 years. He says he's had a psychiatric hospitalization in the past years ago for "nerves" but denies that he's ever tried to kill himself or been violent. Doesn't describe clear-cut psychotic symptoms.  Risk to Self: Suicidal Ideation: No Suicidal Intent: No Is patient at risk for suicide?: No Suicidal Plan?: No Access to Means: No What has been your use of drugs/alcohol within the last 12 months?: denied use How many times?: 0 Other Self Harm Risks: denied Triggers for Past Attempts: None known Intentional Self Injurious Behavior: None Risk to Others: Homicidal Ideation: No Thoughts of Harm to Others: No Current Homicidal Intent: No Current Homicidal Plan: No Access to Homicidal Means: No Identified Victim: None identified History of harm to others?: No Assessment of Violence: None Noted Violent Behavior Description: denied Does patient have access to weapons?: No Criminal Charges Pending?: No Does patient have a court date: No Prior Inpatient Therapy: Prior Inpatient Therapy: No Prior Therapy Dates: n/a Prior Therapy Facilty/Provider(s): n/a Reason for Treatment: n/a Prior Outpatient Therapy: Prior Outpatient Therapy:  No Prior Therapy Dates: n/a Prior Therapy Facilty/Provider(s): n/a Reason for Treatment: n/a Does patient have an ACCT team?: No Does patient have Intensive In-House Services?  : No Does patient have [facility] services? : No Does patient have P4CC services?: No  Past Medical History:  Past Medical History  Diagnosis Date  . Major depressive disorder, recurrent, severe with psychotic features ([place])   . Persistent disorder of initiating or maintaining sleep   . Generalized anxiety disorder   . Major depressive disorder, recurrent, severe with psychotic features ([place])   . Persistent disorder of initiating or maintaining sleep   . Asthma   . Cancer ([place])     Left lung  . COPD (chronic obstructive pulmonary disease) [facility])     Past Surgical History  Procedure Laterality Date  . Hemorroidectomy    . Lung surgery     Family History:  Family History  Problem Relation Age of Onset  . COPD Mother   . Lung cancer Father   . Kidney disease Neg Hx   . Prostate cancer Neg Hx    Family Psychiatric  History: Patient denies knowing of any kind of family history of mental health problems or substance abuse problems Social History:  History  Alcohol Use No     History  Drug Use No    Social History   Social History  . Marital Status: Widowed    Spouse Name: N/A  . Number of Children: N/A  . Years of Education: N/A   Social History Main Topics  .  Smoking status: Former Research scientist (life sciences)  . Smokeless tobacco: Never Used     Comment: quit 18 years ago  . Alcohol Use: No  . Drug Use: No  . Sexual Activity: No   Other Topics Concern  . None   Social History Narrative   Additional Social History:    Allergies:   Allergies  Allergen Reactions  . Penicillins Other (See Comments)    Reaction:  Unknown     Labs:  Results for orders placed or performed during the hospital encounter of 08/30/15 (from the past 48 hour(s))  Urinalysis complete, with microscopic     Status: Abnormal    Collection Time: 08/30/15  2:23 PM  Result Value Ref Range   Color, Urine YELLOW (A) YELLOW   APPearance CLEAR (A) CLEAR   Glucose, UA NEGATIVE NEGATIVE mg/dL   Bilirubin Urine NEGATIVE NEGATIVE   Ketones, ur NEGATIVE NEGATIVE mg/dL   Specific Gravity, Urine 1.014 1.005 - 1.030   Hgb urine dipstick NEGATIVE NEGATIVE   pH 6.0 5.0 - 8.0   Protein, ur NEGATIVE NEGATIVE mg/dL   Nitrite NEGATIVE NEGATIVE   Leukocytes, UA TRACE (A) NEGATIVE   RBC / HPF 0-5 0 - 5 RBC/hpf   WBC, UA 0-5 0 - 5 WBC/hpf   Bacteria, UA NONE SEEN NONE SEEN   Squamous Epithelial / LPF 0-5 (A) NONE SEEN   Mucous PRESENT   Comprehensive metabolic panel     Status: Abnormal   Collection Time: 08/30/15  2:23 PM  Result Value Ref Range   Sodium 138 135 - 145 mmol/L   Potassium 4.2 3.5 - 5.1 mmol/L   Chloride 103 101 - 111 mmol/L   CO2 29 22 - 32 mmol/L   Glucose, Bld 102 (H) 65 - 99 mg/dL   BUN 22 (H) 6 - 20 mg/dL   Creatinine, Ser 0.75 0.61 - 1.24 mg/dL   Calcium 8.2 (L) 8.9 - 10.3 mg/dL   Total Protein 6.8 6.5 - 8.1 g/dL   Albumin 2.8 (L) 3.5 - 5.0 g/dL   AST 13 (L) 15 - 41 U/L   ALT 10 (L) 17 - 63 U/L   Alkaline Phosphatase 64 38 - 126 U/L   Total Bilirubin 0.3 0.3 - 1.2 mg/dL   GFR calc non Af Amer >60 >60 mL/min   GFR calc Af Amer >60 >60 mL/min    Comment: (NOTE) The eGFR has been calculated using the CKD EPI equation. This calculation has not been validated in all clinical situations. eGFR's persistently <60 mL/min signify possible Chronic Kidney Disease.    Anion gap 6 5 - 15  Ammonia     Status: Abnormal   Collection Time: 08/30/15  2:23 PM  Result Value Ref Range   Ammonia <9 (L) 9 - 35 umol/L  CBC with Differential     Status: Abnormal   Collection Time: 08/30/15  2:23 PM  Result Value Ref Range   WBC 6.1 3.8 - 10.6 K/uL   RBC 3.78 (L) 4.40 - 5.90 MIL/uL   Hemoglobin 11.1 (L) 13.0 - 18.0 g/dL   HCT 33.3 (L) 40.0 - 52.0 %   MCV 88.1 80.0 - 100.0 fL   MCH 29.3 26.0 - 34.0 pg   MCHC 33.3  32.0 - 36.0 g/dL   RDW 15.4 (H) 11.5 - 14.5 %   Platelets 186 150 - 440 K/uL   Neutrophils Relative % 70 %   Neutro Abs 4.3 1.4 - 6.5 K/uL   Lymphocytes Relative 11 %  Lymphs Abs 0.7 (L) 1.0 - 3.6 K/uL   Monocytes Relative 16 %   Monocytes Absolute 1.0 0.2 - 1.0 K/uL   Eosinophils Relative 2 %   Eosinophils Absolute 0.1 0 - 0.7 K/uL   Basophils Relative 1 %   Basophils Absolute 0.0 0 - 0.1 K/uL  Ethanol     Status: None   Collection Time: 08/30/15  2:23 PM  Result Value Ref Range   Alcohol, Ethyl (B) <5 <5 mg/dL    Comment:        LOWEST DETECTABLE LIMIT FOR SERUM ALCOHOL IS 5 mg/dL FOR MEDICAL PURPOSES ONLY     Current Facility-Administered Medications  Medication Dose Route Frequency Provider Last Rate Last Dose  . atorvastatin (LIPITOR) tablet 40 mg  40 mg Oral q1800 Lisa Roca, MD      . LORazepam (ATIVAN) tablet 0.5 mg  0.5 mg Oral Q8H PRN Lisa Roca, MD   0.5 mg at 08/31/15 1107  . QUEtiapine (SEROQUEL XR) 24 hr tablet 300 mg  300 mg Oral Daily Lisa Roca, MD   300 mg at 08/31/15 0936  . tamsulosin (FLOMAX) capsule 0.4 mg  0.4 mg Oral Daily Lisa Roca, MD   0.4 mg at 08/31/15 3276  . venlafaxine XR (EFFEXOR-XR) 24 hr capsule 150 mg  150 mg Oral Q breakfast Lisa Roca, MD   150 mg at 08/31/15 1109   Current Outpatient Prescriptions  Medication Sig Dispense Refill  . albuterol (PROVENTIL HFA;VENTOLIN HFA) 108 (90 BASE) MCG/ACT inhaler Inhale 2 puffs into the lungs every 4 (four) hours as needed for wheezing or shortness of breath.     Marland Kitchen atorvastatin (LIPITOR) 40 MG tablet Take 40 mg by mouth at bedtime.     Marland Kitchen BREO ELLIPTA 100-25 MCG/INH AEPB Inhale 1 puff into the lungs daily.     Marland Kitchen esomeprazole (NEXIUM) 40 MG capsule Take 40 mg by mouth daily.     Marland Kitchen guaiFENesin (MUCINEX) 600 MG 12 hr tablet Take 1 tablet (600 mg total) by mouth 2 (two) times daily. 20 tablet 0  . ipratropium-albuterol (DUONEB) 0.5-2.5 (3) MG/3ML SOLN Take 3 mLs by nebulization every 6 (six)  hours as needed. 360 mL 1  . LORazepam (ATIVAN) 0.5 MG tablet Take 0.5 mg by mouth every 8 (eight) hours. Pt takes with a 52m tablet.    .Marland KitchenLORazepam (ATIVAN) 1 MG tablet Take 1 mg by mouth every 8 (eight) hours. Pt takes with a 0.528mtablet.    . potassium chloride SA (K-DUR,KLOR-CON) 20 MEQ tablet Take 1 tablet (20 mEq total) by mouth daily. 30 tablet 3  . QUEtiapine (SEROQUEL XR) 300 MG 24 hr tablet Take 1 tablet (300 mg total) by mouth at bedtime. 90 tablet 1  . senna-docusate (SENOKOT-S) 8.6-50 MG tablet Take 1 tablet by mouth 2 (two) times daily. 30 tablet 0  . tamsulosin (FLOMAX) 0.4 MG CAPS capsule Take 0.4 mg by mouth daily after breakfast.     . tiotropium (SPIRIVA) 18 MCG inhalation capsule Place 18 mcg into inhaler and inhale daily.     . Marland Kitchenenlafaxine XR (EFFEXOR-XR) 150 MG 24 hr capsule Take 1 capsule (150 mg total) by mouth daily. 90 capsule 1  . zolpidem (AMBIEN) 10 MG tablet Take 1 tablet by mouth at bedtime.    . Marland Kitchenevofloxacin (LEVAQUIN) 500 MG tablet Take 1 tablet by mouth daily. Reported on 08/30/2015    . predniSONE (DELTASONE) 5 MG tablet Take 1 tablet by mouth as directed. Reported on 08/30/2015  Musculoskeletal: Strength & Muscle Tone: decreased Gait & Station: normal Patient leans: N/A  Psychiatric Specialty Exam: Review of Systems  Constitutional: Negative.   HENT: Negative.   Eyes: Negative.   Respiratory: Negative.   Cardiovascular: Negative.   Gastrointestinal: Negative.   Musculoskeletal: Negative.   Skin: Negative.   Neurological: Negative.   Psychiatric/Behavioral: Positive for memory loss. Negative for depression, suicidal ideas, hallucinations and substance abuse. The patient is nervous/anxious and has insomnia.     Blood pressure 153/88, pulse 75, temperature 98 F (36.7 C), temperature source Oral, resp. rate 22, height 6' (1.829 m), weight 61.689 kg (136 lb), SpO2 97 %.Body mass index is 18.44 kg/(m^2).  General Appearance: Casual  Eye Contact::   Fair  Speech:  Slow  Volume:  Decreased  Mood:  Anxious  Affect:  Constricted  Thought Process:  Tangential  Orientation:  Other:  He had some idea of some answers to orientation but on his own was not able to tell me where he was or really understand the situation  Thought Content:  Negative  Suicidal Thoughts:  No  Homicidal Thoughts:  No  Memory:  Immediate;   Fair Recent;   Poor Remote;   Fair  Judgement:  Impaired  Insight:  Lacking  Psychomotor Activity:  Decreased  Concentration:  Poor  Recall:  [employer] of Knowledge:Fair  Language: Fair  Akathisia:  No  Handed:  Right  AIMS (if indicated):     Assets:  Desire for Improvement Housing Resilience Social Support  ADL's:  Intact  Cognition: Impaired,  Mild  Sleep:      Treatment Plan Summary: Daily contact with patient to assess and evaluate symptoms and progress in treatment, Medication management and Plan This is an 82 year old man who presents to the hospital after being found wandering around confused. His story has changed a little bit with time and doesn't really hold together. Patient doesn't have much insight into how confused she had been acting. My initial hypothesis was that this gentleman could've been suffering from benzodiazepine withdrawal but he insists that he had been taking his medicine consistently. I also wondered if he could be having benzodiazepine intoxication but we don't have any proof of that and the patient insists that he has never misused his medicine and is able to tell me the correct doses. Other workup so far has not been revealing as to any cause of delirium. We are still awaiting a drug screen to be done but I doubt that it will tell us anything. If it is negative for benzodiazepines may support my. That he had been having some withdrawal. He is still a little bit confused today and on his Mini-Mental status exam he scores a 15 indicating pretty significant dementia. Some of that may be because  of his acute anxiety but nevertheless it suggests some impairment in functioning. As far as psychiatric treatment I don't see any clear need to send him to a geropsychiatry unit but I'm not very comfortable with just releasing him with his car keys. We are going to try and reach his family and see if they can step up and assist with him. I would prefer that he not be driving right now. Case reviewed with emergency room doctor. No other change indicated to his medicine.  Disposition: Patient does not meet criteria for psychiatric inpatient admission. Supportive therapy provided about ongoing stressors.  Alethia Berthold, MD 08/31/2015 12:26 PM

## 2015-08-31 NOTE — ED Notes (Signed)
Pt had period of SOB, relieved with duoneb. 02 on.

## 2015-08-31 NOTE — ED Notes (Signed)
Pt given cath for self cath.

## 2015-08-31 NOTE — ED Provider Notes (Signed)
-----------------------------------------   5:41 AM on 08/31/2015 -----------------------------------------  No acute events overnight. The patient is currently awaiting psychiatric evaluation which should occur this morning. Patient has been cleared from a medical standpoint. Labs are largely within normal limits. Urinalysis negative. Chest x-ray largely unchanged from prior CT.  Harvest Dark, MD 08/31/15 [phone]

## 2015-08-31 NOTE — ED Notes (Signed)
Established contact with pt's daughter Darrol Poke [phone]) regarding pt's d/c needs. Daughter was unhappy that pt was being d/c'd "at dark" and that she was being needed to "drive from  [place]" to obtain pt's house keys so she can retrieve his portable O2 machine from his home (even though TTS had made several attempts earlier in the day to contact her). Daughter stated she would "find out what [she] could do" and will return a phone call to the ED when arrangements can be made.

## 2015-08-31 NOTE — ED Notes (Signed)
Patient noted to desat quickly several times when he takes off 02, lowest noted 80. Recovers quickly when 02 reapplied but needs frequent reminders to keep it on. Denies discomfort. Alternately sitting up in recliner at bedside and resting in bed. Patient often disoriented, does understand when verbally reoriented, is cooperative.

## 2015-08-31 NOTE — Progress Notes (Signed)
Writer has attempted to contact the pts daughter Ms. Sharp, No answer.    08/31/2015 Con Memos, MS, [place], [facility] Therapeutic Triage Specialist

## 2015-08-31 NOTE — Consult Note (Signed)
  Psychiatry: Follow-up on consult from earlier today. A 82-year-old man who appears to have some degree of dementia and was behaving in a confused manner yesterday. I had been hoping that we would be able to find family who could assist in taking him back home but that does not appear to be possible. Sr. is out of town and not available. Daughter from what I am told has not returned any calls. Patient has said that he is not surprised and there seems to be a suggestion that he may not be on the best terms with her. Patient has no new complaints this afternoon. He is alert and oriented to his current situation and how he came to be here. He knows where he is any known as how to get back home. He is complaining of some pain which he knows is related to his cancer but says it's pretty typical. He has not behaved in a dangerous manner here in the hospital.  At this point given that our options are either to have him go home or to insist on geriatric psychiatry at think by far the better option would be to allow this gentleman to go home. I don't think it's going to be in his best interest or of any benefit to him to force him to go to a geriatric psychiatry unit just for memory loss. Case reviewed with emergency room doctor. I have reviewed with the patient his Klonopin dosage and he can recite it to me by himself. He knows that he should just continue his Klonopin as it has been along with his other medicines.  Patient is not on commitment. Behavior is calm. He does not need any psychiatric follow-up to be arranged as he Artie is seeing a psychiatrist in our office. Again, case reviewed with the ER doctor and TTS.

## 2015-09-01 NOTE — ED Notes (Signed)
Reviewed discharge instructions with his family member also -  I assisted him into the car- home o2 applied to pt

## 2015-09-01 NOTE — ED Notes (Signed)
Pt changing clothes.

## 2015-09-01 NOTE — ED Notes (Signed)

## 2015-09-01 NOTE — ED Provider Notes (Signed)
-----------------------------------------   6:49 AM on 09/01/2015 -----------------------------------------   Blood pressure 112/88, pulse 93, temperature 98 F (36.7 C), temperature source Axillary, resp. rate 18, height 6' (1.829 m), weight 136 lb (61.689 kg), SpO2 97 %.  The patient had no acute events since last update.  Calm and cooperative at this time.  The patient has been cleared by psych to be taken back to his home.     Loney Hering, MD 09/01/15 [phone]

## 2015-09-01 NOTE — ED Notes (Signed)
He is sitting on the side of the bed  NAD assessed  Coffee provided along with a warm blanket  He denies pain   Assessment completed  Continue to monitor

## 2015-09-01 NOTE — ED Notes (Signed)
ED BHU [place] Is the patient under IVC or is there intent for IVC:  no Is the patient medically cleared: Yes.   Is there vacancy in the ED BHU: Yes.   Is the population mix appropriate for patient: Yes.   Is the patient awaiting placement in inpatient or outpatient setting: Yes.   Has the patient had a psychiatric consult: Yes.   Survey of unit performed for contraband, proper placement and condition of furniture, tampering with fixtures in bathroom, shower, and each patient room: Yes.  ; Findings:  APPEARANCE/BEHAVIOR Calm and cooperative NEURO ASSESSMENT Orientation: oriented to self and place  Denies pain Hallucinations: No.None noted (Hallucinations) Speech: Normal Gait: normal RESPIRATORY ASSESSMENT Even  Unlabored respirations  CARDIOVASCULAR ASSESSMENT Pulses equal   regular rate  Skin warm and dry   GASTROINTESTINAL ASSESSMENT no GI complaint EXTREMITIES Full ROM  PLAN OF CARE Provide calm/safe environment. Vital signs assessed twice daily. ED BHU Assessment once each 12-hour shift. Collaborate with intake RN daily or as condition indicates. Assure the ED provider has rounded once each shift. Provide and encourage hygiene. Provide redirection as needed. Assess for escalating behavior; address immediately and inform ED provider.  Assess family dynamic and appropriateness for visitation as needed: Yes.  ; If necessary, describe findings:  Educate the patient/family about BHU procedures/visitation: Yes.  ; If necessary, describe findings:

## 2015-09-01 NOTE — ED Notes (Signed)
Breakfast provided   Pt observed sitting on the side of the bed  NAD  Continue to monitor

## 2015-09-01 NOTE — ED Notes (Signed)
Pt assisted to BR to perform I&O self catheterization. Pt performed self peri-care; self cath successful. Pt voices relief from bladder pressure/distention, assisted back to bed safely with no needs or concerns voiced at this time.

## 2015-09-03 ENCOUNTER — Inpatient Hospital Stay: Payer: Medicare HMO

## 2015-09-03 ENCOUNTER — Inpatient Hospital Stay: Payer: Medicare HMO | Admitting: Oncology

## 2015-09-04 ENCOUNTER — Encounter: Payer: Self-pay | Admitting: Emergency Medicine

## 2015-09-04 ENCOUNTER — Emergency Department: Payer: Medicare HMO

## 2015-09-04 ENCOUNTER — Emergency Department
Admission: EM | Admit: 2015-09-04 | Discharge: 2015-09-09 | Disposition: A | Discharge status: Other Acute Inpatient Hospital | Payer: Medicare HMO | Attending: Emergency Medicine | Admitting: Emergency Medicine

## 2015-09-04 DIAGNOSIS — S0990XA Unspecified injury of head, initial encounter: Secondary | ICD-10-CM | POA: Diagnosis not present

## 2015-09-04 DIAGNOSIS — Z79899 Other long term (current) drug therapy: Secondary | ICD-10-CM | POA: Insufficient documentation

## 2015-09-04 DIAGNOSIS — F29 Unspecified psychosis not due to a substance or known physiological condition: Secondary | ICD-10-CM | POA: Diagnosis not present

## 2015-09-04 DIAGNOSIS — J690 Pneumonitis due to inhalation of food and vomit: Secondary | ICD-10-CM | POA: Insufficient documentation

## 2015-09-04 DIAGNOSIS — F0390 Unspecified dementia without behavioral disturbance: Secondary | ICD-10-CM | POA: Diagnosis present

## 2015-09-04 DIAGNOSIS — F333 Major depressive disorder, recurrent, severe with psychotic symptoms: Secondary | ICD-10-CM | POA: Diagnosis not present

## 2015-09-04 DIAGNOSIS — Z85118 Personal history of other malignant neoplasm of bronchus and lung: Secondary | ICD-10-CM | POA: Insufficient documentation

## 2015-09-04 DIAGNOSIS — R531 Weakness: Secondary | ICD-10-CM | POA: Insufficient documentation

## 2015-09-04 DIAGNOSIS — Z87891 Personal history of nicotine dependence: Secondary | ICD-10-CM | POA: Insufficient documentation

## 2015-09-04 DIAGNOSIS — C3490 Malignant neoplasm of unspecified part of unspecified bronchus or lung: Secondary | ICD-10-CM | POA: Diagnosis present

## 2015-09-04 DIAGNOSIS — J449 Chronic obstructive pulmonary disease, unspecified: Secondary | ICD-10-CM | POA: Diagnosis present

## 2015-09-04 DIAGNOSIS — R4182 Altered mental status, unspecified: Secondary | ICD-10-CM | POA: Diagnosis present

## 2015-09-04 DIAGNOSIS — F411 Generalized anxiety disorder: Secondary | ICD-10-CM | POA: Diagnosis present

## 2015-09-04 LAB — COMPREHENSIVE METABOLIC PANEL
ALK PHOS: 63 U/L (ref 38–126)
ALT: 12 U/L — AB (ref 17–63)
ANION GAP: 6 (ref 5–15)
AST: 18 U/L (ref 15–41)
Albumin: 2.7 g/dL — ABNORMAL LOW (ref 3.5–5.0)
BUN: 17 mg/dL (ref 6–20)
CALCIUM: 8.5 mg/dL — AB (ref 8.9–10.3)
CHLORIDE: 103 mmol/L (ref 101–111)
CO2: 32 mmol/L (ref 22–32)
CREATININE: 0.86 mg/dL (ref 0.61–1.24)
Glucose, Bld: 156 mg/dL — ABNORMAL HIGH (ref 65–99)
Potassium: 3.6 mmol/L (ref 3.5–5.1)
Sodium: 141 mmol/L (ref 135–145)
Total Bilirubin: 0.3 mg/dL (ref 0.3–1.2)
Total Protein: 6.5 g/dL (ref 6.5–8.1)

## 2015-09-04 LAB — URINE DRUG SCREEN, QUALITATIVE (ARMC ONLY)
AMPHETAMINES, UR SCREEN: NOT DETECTED
BARBITURATES, UR SCREEN: NOT DETECTED
BENZODIAZEPINE, UR SCRN: POSITIVE — AB
Cannabinoid 50 Ng, Ur ~~LOC~~: NOT DETECTED
Cocaine Metabolite,Ur ~~LOC~~: NOT DETECTED
MDMA (Ecstasy)Ur Screen: NOT DETECTED
METHADONE SCREEN, URINE: NOT DETECTED
OPIATE, UR SCREEN: NOT DETECTED
Phencyclidine (PCP) Ur S: NOT DETECTED
TRICYCLIC, UR SCREEN: POSITIVE — AB

## 2015-09-04 LAB — ETHANOL

## 2015-09-04 LAB — URINALYSIS COMPLETE WITH MICROSCOPIC (ARMC ONLY)
BACTERIA UA: NONE SEEN
Bilirubin Urine: NEGATIVE
GLUCOSE, UA: NEGATIVE mg/dL
Hgb urine dipstick: NEGATIVE
Ketones, ur: NEGATIVE mg/dL
NITRITE: NEGATIVE
PROTEIN: NEGATIVE mg/dL
SPECIFIC GRAVITY, URINE: 1.014 (ref 1.005–1.030)
pH: 5 (ref 5.0–8.0)

## 2015-09-04 LAB — CBC
HCT: 31.5 % — ABNORMAL LOW (ref 40.0–52.0)
HEMOGLOBIN: 10.2 g/dL — AB (ref 13.0–18.0)
MCH: 29.1 pg (ref 26.0–34.0)
MCHC: 32.6 g/dL (ref 32.0–36.0)
MCV: 89.5 fL (ref 80.0–100.0)
Platelets: 158 10*3/uL (ref 150–440)
RBC: 3.52 MIL/uL — AB (ref 4.40–5.90)
RDW: 15.5 % — AB (ref 11.5–14.5)
WBC: 4.4 10*3/uL (ref 3.8–10.6)

## 2015-09-04 LAB — ACETAMINOPHEN LEVEL

## 2015-09-04 LAB — TROPONIN I

## 2015-09-04 LAB — SALICYLATE LEVEL: Salicylate Lvl: <4 mg/dL (ref 2.8–30.0)

## 2015-09-04 MED ORDER — LORAZEPAM 1 MG PO TABS
1.0000 mg | ORAL_TABLET | Freq: Three times a day (TID) | ORAL | Status: DC
  Administered 2015-09-04 – 2015-09-09 (×13): 1 mg via ORAL
  Filled 2015-09-04 (×13): qty 1

## 2015-09-04 MED ORDER — TIOTROPIUM BROMIDE MONOHYDRATE 18 MCG IN CAPS
18.0000 ug | ORAL_CAPSULE | Freq: Every day | RESPIRATORY_TRACT | Status: DC
  Administered 2015-09-04 – 2015-09-09 (×6): 18 ug via RESPIRATORY_TRACT
  Filled 2015-09-04 (×2): qty 5

## 2015-09-04 MED ORDER — QUETIAPINE FUMARATE 200 MG PO TABS
200.0000 mg | ORAL_TABLET | Freq: Every day | ORAL | Status: DC
  Administered 2015-09-04 – 2015-09-08 (×5): 200 mg via ORAL
  Filled 2015-09-04 (×5): qty 1

## 2015-09-04 MED ORDER — ATORVASTATIN CALCIUM 20 MG PO TABS
40.0000 mg | ORAL_TABLET | Freq: Every day | ORAL | Status: DC
  Administered 2015-09-05 – 2015-09-09 (×4): 40 mg via ORAL
  Filled 2015-09-04 (×4): qty 2

## 2015-09-04 MED ORDER — TAMSULOSIN HCL 0.4 MG PO CAPS
0.4000 mg | ORAL_CAPSULE | Freq: Every day | ORAL | Status: DC
  Administered 2015-09-05 – 2015-09-09 (×5): 0.4 mg via ORAL
  Filled 2015-09-04 (×4): qty 1

## 2015-09-04 MED ORDER — ALBUTEROL SULFATE HFA 108 (90 BASE) MCG/ACT IN AERS
2.0000 | INHALATION_SPRAY | Freq: Four times a day (QID) | RESPIRATORY_TRACT | Status: DC
  Administered 2015-09-04 – 2015-09-09 (×15): 2 via RESPIRATORY_TRACT
  Filled 2015-09-04 (×2): qty 6.7

## 2015-09-04 MED ORDER — VENLAFAXINE HCL ER 75 MG PO CP24
75.0000 mg | ORAL_CAPSULE | Freq: Every day | ORAL | Status: DC
  Administered 2015-09-05 – 2015-09-09 (×5): 75 mg via ORAL
  Filled 2015-09-04 (×6): qty 1

## 2015-09-04 MED ORDER — MOMETASONE FURO-FORMOTEROL FUM 100-5 MCG/ACT IN AERO
2.0000 | INHALATION_SPRAY | Freq: Two times a day (BID) | RESPIRATORY_TRACT | Status: DC
Start: 2015-09-04 — End: 2015-09-09
  Administered 2015-09-04 – 2015-09-09 (×10): 2 via RESPIRATORY_TRACT
  Filled 2015-09-04: qty 8.8

## 2015-09-04 MED ORDER — PANTOPRAZOLE SODIUM 40 MG PO TBEC
40.0000 mg | DELAYED_RELEASE_TABLET | Freq: Every day | ORAL | Status: DC
  Administered 2015-09-04 – 2015-09-09 (×6): 40 mg via ORAL
  Filled 2015-09-04 (×6): qty 1

## 2015-09-04 NOTE — BH Assessment (Signed)
Referral information for Geriatric Placement have been faxed to;    [phone]),    [phone]),    [facility]([phone]),    [phone]),    [facility] [phone])   [phone]).

## 2015-09-04 NOTE — Consult Note (Signed)
[facility] Face-to-Face Psychiatry Consult   Reason for Consult:  Consult for this 82 year old man with a history of anxiety and depression and now dementia brought back into the hospital after family found him unable to care for himself Referring Physician:  Clearnce Hasten Patient Identification: Timothy Ali MRN:  [id number] Principal Diagnosis: Depression, major, recurrent, severe with psychosis ([place]) Diagnosis:   Patient Active Problem List   Diagnosis Date Noted  . Dementia [F03.90] 08/31/2015  . Generalized anxiety disorder [F41.1] 08/31/2015  . SOB (shortness of breath) [R06.02] 08/19/2015  . BPH (benign prostatic hypertrophy) with urinary retention [N40.0] 02/16/2015  . Metastatic lung cancer (metastasis from lung to other site) ([place]) [C34.90] 02/16/2015  . Lung cancer ([place]) [C34.90] 10/24/2014  . Acute on chronic respiratory failure ([place]) [J96.20] 10/23/2014  . Leucocytosis [D72.829] 10/23/2014  . COPD (chronic obstructive pulmonary disease) ([place]) [J44.9] 10/23/2014  . Carcinoma, lung ([place]) [C34.90] 10/23/2014  . Cancer of lung ([place]) [C34.90] 10/23/2014  . CAFL (chronic airflow limitation) ([place]) [J44.9] 08/01/2014  . Depression, major, recurrent, severe with psychosis ([place]) [F33.3] 08/01/2014  . H/O gastrointestinal disease [Z87.19] 08/01/2014  . Anxiety, generalized [F41.1] 08/01/2014  . H/O elevated lipids [Z86.39] 08/01/2014  . Insomnia, persistent [G47.00] 08/01/2014  . H/O malignant neoplasm [Z85.9] 08/01/2014  . Prostate disease [N42.9] 08/01/2014  . Macular degeneration [H35.30] 08/01/2014  . Obstruction of urinary tract [N13.9] 08/01/2014  . Chronic obstructive pulmonary disease ([place]) [J44.9] 08/01/2014  . Severe episode of recurrent major depressive disorder, with psychotic features ([place]) [F33.3] 08/01/2014    Total Time spent with patient: 45 minutes  Subjective:   Timothy Ali is a 82 y.o. male patient admitted with "I didn't do anything wrong".  HPI:  Patient  interviewed. Also spoke with his sister. Labs reviewed. Patient known to me from just being in the emergency room the other day. Old chart reviewed. 82 year old man with history of depression and anxiety who has recently been having more and more confusion. He was in the emergency room the other day after spell of confusion in the hospital. After going home apparently he has continued to be confused. He didn't get his medicines filled the way he was supposed to. Sisters went by to visit him and found him walking around naked with urine and feces on the floor confused and probably hallucinating. They feel that they can't take care of him and look over him anymore. Patient is not a very good historian. He is somewhat confused about his current situation doesn't remember how he got here.  Social history: Patient has been living by himself with some family supervision. Until recently he had been still driving. Has rapidly got more confused in the last few weeks.  Medical history: Patient has advanced lung cancer. Has gotten treatment for it. Severe COPD and lung problems. Weight loss. History of gastric reflux disease.  Substance abuse history: Patient denies that he's been drinking or abusing any drugs. Said he used to drink many years ago but quit.  Past Psychiatric History: Patient has been treated as an outpatient by psychiatrist in our office. He was receiving combination of antidepressants and antianxiety medicines. Recently a change in psychiatrist initiated some taper of his benzodiazepines. Patient rapidly ran out of his benzodiazepines and was probably having some withdrawal. No known history of suicide attempts. I don't think he's had inpatient psychiatric hospitalization in the past.  Risk to Self: Suicidal Ideation: No Suicidal Intent: No Is patient at risk for suicide?: No Suicidal Plan?: No Access  to Means: No What has been your use of drugs/alcohol within the last 12 months?: Reports of  none How many times?: 0 Other Self Harm Risks: Reports of none Triggers for Past Attempts: Other (Comment) (Off medications) Intentional Self Injurious Behavior: None Risk to Others: Homicidal Ideation: No Thoughts of Harm to Others: No Current Homicidal Intent: No Current Homicidal Plan: No Access to Homicidal Means: No Identified Victim: Reports of none History of harm to others?: No Assessment of Violence: None Noted Violent Behavior Description: Reports of none Does patient have access to weapons?: No Criminal Charges Pending?: No Does patient have a court date: No Prior Inpatient Therapy: Prior Inpatient Therapy: Yes Prior Therapy Dates: 04/2005 Prior Therapy Facilty/Provider(s): [facility] Reason for Treatment: Paranoid State Prior Outpatient Therapy: Prior Outpatient Therapy: Yes Prior Therapy Dates: Currently Prior Therapy Facilty/Provider(s): [facility] Reason for Treatment: Major depressive disorder, recurrent, severe with psychotic features  Does patient have an ACCT team?: No Does patient have Intensive In-House Services?  : No Does patient have [facility] services? : No Does patient have P4CC services?: No  Past Medical History:  Past Medical History  Diagnosis Date  . Major depressive disorder, recurrent, severe with psychotic features ([place])   . Persistent disorder of initiating or maintaining sleep   . Generalized anxiety disorder   . Major depressive disorder, recurrent, severe with psychotic features ([place])   . Persistent disorder of initiating or maintaining sleep   . Asthma   . Cancer ([place])     Left lung  . COPD (chronic obstructive pulmonary disease) [facility])     Past Surgical History  Procedure Laterality Date  . Hemorroidectomy    . Lung surgery     Family History:  Family History  Problem Relation Age of Onset  . COPD Mother   . Lung cancer Father   . Kidney disease Neg Hx   . Prostate cancer Neg Hx    Family Psychiatric   History: Family history of depression Social History:  History  Alcohol Use No     History  Drug Use No    Social History   Social History  . Marital Status: Widowed    Spouse Name: N/A  . Number of Children: N/A  . Years of Education: N/A   Social History Main Topics  . Smoking status: Former Research scientist (life sciences)  . Smokeless tobacco: Never Used     Comment: quit 18 years ago  . Alcohol Use: No  . Drug Use: No  . Sexual Activity: No   Other Topics Concern  . None   Social History Narrative   Additional Social History:    Allergies:   Allergies  Allergen Reactions  . Penicillins Other (See Comments)    Reaction:  Unknown     Labs:  Results for orders placed or performed during the hospital encounter of 09/04/15 (from the past 48 hour(s))  Comprehensive metabolic panel     Status: Abnormal   Collection Time: 09/04/15 11:56 AM  Result Value Ref Range   Sodium 141 135 - 145 mmol/L   Potassium 3.6 3.5 - 5.1 mmol/L   Chloride 103 101 - 111 mmol/L   CO2 32 22 - 32 mmol/L   Glucose, Bld 156 (H) 65 - 99 mg/dL   BUN 17 6 - 20 mg/dL   Creatinine, Ser 0.86 0.61 - 1.24 mg/dL   Calcium 8.5 (L) 8.9 - 10.3 mg/dL   Total Protein 6.5 6.5 - 8.1 g/dL   Albumin 2.7 (L) 3.5 -  5.0 g/dL   AST 18 15 - 41 U/L   ALT 12 (L) 17 - 63 U/L   Alkaline Phosphatase 63 38 - 126 U/L   Total Bilirubin 0.3 0.3 - 1.2 mg/dL   GFR calc non Af Amer >60 >60 mL/min   GFR calc Af Amer >60 >60 mL/min    Comment: (NOTE) The eGFR has been calculated using the CKD EPI equation. This calculation has not been validated in all clinical situations. eGFR's persistently <60 mL/min signify possible Chronic Kidney Disease.    Anion gap 6 5 - 15  CBC     Status: Abnormal   Collection Time: 09/04/15 11:56 AM  Result Value Ref Range   WBC 4.4 3.8 - 10.6 K/uL   RBC 3.52 (L) 4.40 - 5.90 MIL/uL   Hemoglobin 10.2 (L) 13.0 - 18.0 g/dL   HCT 31.5 (L) 40.0 - 52.0 %   MCV 89.5 80.0 - 100.0 fL   MCH 29.1 26.0 - 34.0 pg    MCHC 32.6 32.0 - 36.0 g/dL   RDW 15.5 (H) 11.5 - 14.5 %   Platelets 158 150 - 440 K/uL  Acetaminophen level     Status: Abnormal   Collection Time: 09/04/15 11:56 AM  Result Value Ref Range   Acetaminophen (Tylenol), Serum <10 (L) 10 - 30 ug/mL    Comment:        THERAPEUTIC CONCENTRATIONS VARY SIGNIFICANTLY. A RANGE OF 10-30 ug/mL MAY BE AN EFFECTIVE CONCENTRATION FOR MANY PATIENTS. HOWEVER, SOME ARE BEST TREATED AT CONCENTRATIONS OUTSIDE THIS RANGE. ACETAMINOPHEN CONCENTRATIONS >150 ug/mL AT 4 HOURS AFTER INGESTION AND >50 ug/mL AT 12 HOURS AFTER INGESTION ARE OFTEN ASSOCIATED WITH TOXIC REACTIONS.   Salicylate level     Status: None   Collection Time: 09/04/15 11:56 AM  Result Value Ref Range   Salicylate Lvl <1.8 2.8 - 30.0 mg/dL  Ethanol     Status: None   Collection Time: 09/04/15 11:56 AM  Result Value Ref Range   Alcohol, Ethyl (B) <5 <5 mg/dL    Comment:        LOWEST DETECTABLE LIMIT FOR SERUM ALCOHOL IS 5 mg/dL FOR MEDICAL PURPOSES ONLY   Troponin I     Status: None   Collection Time: 09/04/15 11:56 AM  Result Value Ref Range   Troponin I <0.03 <0.031 ng/mL    Comment:        NO INDICATION OF MYOCARDIAL INJURY.   Urinalysis complete, with microscopic ([facility] only)     Status: Abnormal   Collection Time: 09/04/15  6:31 PM  Result Value Ref Range   Color, Urine YELLOW (A) YELLOW   APPearance CLEAR (A) CLEAR   Glucose, UA NEGATIVE NEGATIVE mg/dL   Bilirubin Urine NEGATIVE NEGATIVE   Ketones, ur NEGATIVE NEGATIVE mg/dL   Specific Gravity, Urine 1.014 1.005 - 1.030   Hgb urine dipstick NEGATIVE NEGATIVE   pH 5.0 5.0 - 8.0   Protein, ur NEGATIVE NEGATIVE mg/dL   Nitrite NEGATIVE NEGATIVE   Leukocytes, UA TRACE (A) NEGATIVE   RBC / HPF 0-5 0 - 5 RBC/hpf   WBC, UA 0-5 0 - 5 WBC/hpf   Bacteria, UA NONE SEEN NONE SEEN   Squamous Epithelial / LPF 0-5 (A) NONE SEEN   Mucous PRESENT     Current Facility-Administered Medications  Medication Dose Route  Frequency Provider Last Rate Last Dose  . albuterol (PROVENTIL HFA;VENTOLIN HFA) 108 (90 Base) MCG/ACT inhaler 2 puff  2 puff Inhalation Q6H Gonzella Lex, MD      . [  START ON 09/05/2015] atorvastatin (LIPITOR) tablet 40 mg  40 mg Oral q1800 Gonzella Lex, MD      . LORazepam (ATIVAN) tablet 1 mg  1 mg Oral TID Gonzella Lex, MD      . mometasone-formoterol (DULERA) 100-5 MCG/ACT inhaler 2 puff  2 puff Inhalation BID Gonzella Lex, MD      . pantoprazole (PROTONIX) EC tablet 40 mg  40 mg Oral Daily John T Clapacs, MD      . QUEtiapine (SEROQUEL) tablet 200 mg  200 mg Oral QHS Gonzella Lex, MD      . Derrill Memo ON 09/05/2015] tamsulosin (FLOMAX) capsule 0.4 mg  0.4 mg Oral QPC breakfast Gonzella Lex, MD      . tiotropium [facility]) inhalation capsule 18 mcg  18 mcg Inhalation Daily Gonzella Lex, MD      . Derrill Memo ON 09/05/2015] venlafaxine XR (EFFEXOR-XR) 24 hr capsule 75 mg  75 mg Oral Q breakfast Gonzella Lex, MD       Current Outpatient Prescriptions  Medication Sig Dispense Refill  . albuterol (PROVENTIL HFA;VENTOLIN HFA) 108 (90 BASE) MCG/ACT inhaler Inhale 2 puffs into the lungs every 4 (four) hours as needed for wheezing or shortness of breath.     Marland Kitchen atorvastatin (LIPITOR) 40 MG tablet Take 40 mg by mouth at bedtime.     Marland Kitchen BREO ELLIPTA 100-25 MCG/INH AEPB Inhale 1 puff into the lungs daily.     Marland Kitchen esomeprazole (NEXIUM) 40 MG capsule Take 40 mg by mouth daily.     Marland Kitchen guaiFENesin (MUCINEX) 600 MG 12 hr tablet Take 1 tablet (600 mg total) by mouth 2 (two) times daily. 20 tablet 0  . ipratropium-albuterol (DUONEB) 0.5-2.5 (3) MG/3ML SOLN Take 3 mLs by nebulization every 6 (six) hours as needed. 360 mL 1  . levofloxacin (LEVAQUIN) 500 MG tablet Take 1 tablet by mouth daily. Reported on 08/30/2015    . LORazepam (ATIVAN) 0.5 MG tablet Take 0.5 mg by mouth every 8 (eight) hours. Pt takes with a 7m tablet.    .Marland KitchenLORazepam (ATIVAN) 1 MG tablet Take 1 mg by mouth every 8 (eight) hours. Pt takes with  a 0.554mtablet.    . potassium chloride SA (K-DUR,KLOR-CON) 20 MEQ tablet Take 1 tablet (20 mEq total) by mouth daily. 30 tablet 3  . predniSONE (DELTASONE) 5 MG tablet Take 1 tablet by mouth as directed. Reported on 08/30/2015    . QUEtiapine (SEROQUEL XR) 300 MG 24 hr tablet Take 1 tablet (300 mg total) by mouth at bedtime. 90 tablet 1  . senna-docusate (SENOKOT-S) 8.6-50 MG tablet Take 1 tablet by mouth 2 (two) times daily. 30 tablet 0  . tamsulosin (FLOMAX) 0.4 MG CAPS capsule Take 0.4 mg by mouth daily after breakfast.     . tiotropium (SPIRIVA) 18 MCG inhalation capsule Place 18 mcg into inhaler and inhale daily.     . Marland Kitchenenlafaxine XR (EFFEXOR-XR) 150 MG 24 hr capsule Take 1 capsule (150 mg total) by mouth daily. 90 capsule 1  . zolpidem (AMBIEN) 10 MG tablet Take 1 tablet by mouth at bedtime.      Musculoskeletal: Strength & Muscle Tone: decreased Gait & Station: ataxic Patient leans: N/A  Psychiatric Specialty Exam: Review of Systems  HENT: Negative.   Eyes: Negative.   Respiratory: Negative.   Cardiovascular: Negative.   Gastrointestinal: Negative.   Genitourinary: Positive for dysuria.  Musculoskeletal: Negative.   Skin: Negative.   Neurological: Positive for weakness.  Psychiatric/Behavioral: Positive for depression, hallucinations and memory loss. Negative for suicidal ideas and substance abuse. The patient is nervous/anxious and has insomnia.     Blood pressure 128/61, pulse 93, temperature 98 F (36.7 C), temperature source Oral, resp. rate 24, weight 61.689 kg (136 lb), SpO2 88 %.Body mass index is 18.44 kg/(m^2).  General Appearance: Disheveled  Eye Contact::  Minimal  Speech:  Slurred  Volume:  Decreased  Mood:  Anxious  Affect:  Constricted  Thought Process:  Irrelevant  Orientation:  Full (Time, Place, and Person)  Thought Content:  Paranoid Ideation  Suicidal Thoughts:  No  Homicidal Thoughts:  No  Memory:  Immediate;   Good Recent;   Fair Remote;   Fair   Judgement:  Impaired  Insight:  Shallow  Psychomotor Activity:  Decreased  Concentration:  Poor  Recall:  Poor  Fund of Knowledge:Poor  Language: Poor  Akathisia:  No  Handed:  Right  AIMS (if indicated):     Assets:  Housing Social Support  ADL's:  Impaired  Cognition: Impaired,  Mild and Moderate  Sleep:      Treatment Plan Summary: Daily contact with patient to assess and evaluate symptoms and progress in treatment, Medication management and Plan 82 year old man with worsening dementia poor self-care clearly unable to take care of himself at home. Probably a combination of depression and dementia results of his illness malnutrition medicine withdrawal. All the labs are not yet back we don't know. Has a urinary tract infection. That can be treated in the ER. In the meantime I think it's appropriate to uphold the IVC. Because of his age and oxygen status we can't admit him downstairs. I have talked with TTS and asked that he be referred to geropsychiatry. All of his usual medicines are in place for now. Case reviewed with the ER doctor.  Disposition: Recommend psychiatric Inpatient admission when medically cleared.  Alethia Berthold, MD 09/04/2015 6:56 PM

## 2015-09-04 NOTE — ED Notes (Signed)
Pharm called for albuterol inhaler, med unavailable

## 2015-09-04 NOTE — ED Notes (Addendum)
Patient was discharged from our facility on Tuesday, patient was admitted for psych eval in the ED on Sunday. Today patient was found at his house naked, there were catheters all over the floor. Patient sister also reports that patient has been off of some of his medications recently. Patient has his Effexor, Seroquel, and Ativan filled in March with a 90 day supply but he is already out of his medication.

## 2015-09-04 NOTE — ED Notes (Addendum)
Pt oriented to room 22  3L o2 chronic  No report has been received from RN taking care of him in room [place]

## 2015-09-04 NOTE — ED Notes (Signed)
Patient has one-on-one sitter

## 2015-09-04 NOTE — ED Notes (Signed)
Pt to ed with family who reports home health nurse called them today to report pt was found naked, confused, and with urine all over his house.  Per family dr Hall Busing, pmd, told them they should bring the pt to ed.  Pt alert at triage but confused to time.  Pt reports he lives alone, drives and cooks for himself and does not know why he is here.  Denies all pain or problems.

## 2015-09-04 NOTE — ED Provider Notes (Signed)
[facility] Emergency Department Provider Note   ____________________________________________  Time seen: Approximately 3:40 PM  I have reviewed the triage vital signs and the nursing notes.   HISTORY  Chief Complaint Altered Mental Status   HPI Timothy Ali is a 82 y.o. male with a history of dementia as well as lung cancer who is presenting to the emergency department today for altered mental status. Per his sister, he was found in his house naked with medical supply strewn all over his home. The sister is also unsure if he had fallen or not. The patient has no complaints at this time and says he would like to go home. He did have a recent stay in the emergency department for psychiatric admission was deemed safe for discharge to home. However, he has not refilled any of his psychiatric meds or any other meds. His sister is at the bedside and thinks that these symptoms are secondary to him being noncompliant with his medications. The patient denies any homicidal or suicidal ideations. Denies any hallucinations. The patient is unable to give an answer why his house was a mess.    Past Medical History  Diagnosis Date  . Major depressive disorder, recurrent, severe with psychotic features ([place])   . Persistent disorder of initiating or maintaining sleep   . Generalized anxiety disorder   . Major depressive disorder, recurrent, severe with psychotic features ([place])   . Persistent disorder of initiating or maintaining sleep   . Asthma   . Cancer ([place])     Left lung  . COPD (chronic obstructive pulmonary disease) [facility])     Patient Active Problem List   Diagnosis Date Noted  . Dementia 08/31/2015  . Generalized anxiety disorder 08/31/2015  . SOB (shortness of breath) 08/19/2015  . BPH (benign prostatic hypertrophy) with urinary retention 02/16/2015  . Metastatic lung cancer (metastasis from lung to other site) ([place]) 02/16/2015  . Lung cancer ([place])  10/24/2014  . Acute on chronic respiratory failure ([place]) 10/23/2014  . Leucocytosis 10/23/2014  . COPD (chronic obstructive pulmonary disease) ([place]) 10/23/2014  . Carcinoma, lung ([place]) 10/23/2014  . Cancer of lung ([place]) 10/23/2014  . CAFL (chronic airflow limitation) ([place]) 08/01/2014  . Depression, major, recurrent, severe with psychosis ([place]) 08/01/2014  . H/O gastrointestinal disease 08/01/2014  . Anxiety, generalized 08/01/2014  . H/O elevated lipids 08/01/2014  . Insomnia, persistent 08/01/2014  . H/O malignant neoplasm 08/01/2014  . Prostate disease 08/01/2014  . Macular degeneration 08/01/2014  . Obstruction of urinary tract 08/01/2014  . Chronic obstructive pulmonary disease ([place]) 08/01/2014  . Severe episode of recurrent major depressive disorder, with psychotic features ([place]) 08/01/2014    Past Surgical History  Procedure Laterality Date  . Hemorroidectomy    . Lung surgery      Current Outpatient Rx  Name  Route  Sig  Dispense  Refill  . albuterol (PROVENTIL HFA;VENTOLIN HFA) 108 (90 BASE) MCG/ACT inhaler   Inhalation   Inhale 2 puffs into the lungs every 4 (four) hours as needed for wheezing or shortness of breath.          Marland Kitchen atorvastatin (LIPITOR) 40 MG tablet   Oral   Take 40 mg by mouth at bedtime.          Marland Kitchen BREO ELLIPTA 100-25 MCG/INH AEPB   Inhalation   Inhale 1 puff into the lungs daily.            Dispense as written.   Marland Kitchen esomeprazole (  NEXIUM) 40 MG capsule   Oral   Take 40 mg by mouth daily.          Marland Kitchen guaiFENesin (MUCINEX) 600 MG 12 hr tablet   Oral   Take 1 tablet (600 mg total) by mouth 2 (two) times daily.   20 tablet   0   . ipratropium-albuterol (DUONEB) 0.5-2.5 (3) MG/3ML SOLN   Nebulization   Take 3 mLs by nebulization every 6 (six) hours as needed.   360 mL   1   . levofloxacin (LEVAQUIN) 500 MG tablet   Oral   Take 1 tablet by mouth daily. Reported on 08/30/2015         . LORazepam (ATIVAN) 0.5 MG tablet   Oral    Take 0.5 mg by mouth every 8 (eight) hours. Pt takes with a '1mg'$  tablet.         Marland Kitchen LORazepam (ATIVAN) 1 MG tablet   Oral   Take 1 mg by mouth every 8 (eight) hours. Pt takes with a 0.'5mg'$  tablet.         . potassium chloride SA (K-DUR,KLOR-CON) 20 MEQ tablet   Oral   Take 1 tablet (20 mEq total) by mouth daily.   30 tablet   3   . predniSONE (DELTASONE) 5 MG tablet   Oral   Take 1 tablet by mouth as directed. Reported on 08/30/2015         . QUEtiapine (SEROQUEL XR) 300 MG 24 hr tablet   Oral   Take 1 tablet (300 mg total) by mouth at bedtime.   90 tablet   1     HOLD FILLING UNTIL PATIENT CALLS FOR REFILL.   Marland Kitchen senna-docusate (SENOKOT-S) 8.6-50 MG tablet   Oral   Take 1 tablet by mouth 2 (two) times daily.   30 tablet   0   . tamsulosin (FLOMAX) 0.4 MG CAPS capsule   Oral   Take 0.4 mg by mouth daily after breakfast.          . tiotropium (SPIRIVA) 18 MCG inhalation capsule   Inhalation   Place 18 mcg into inhaler and inhale daily.          Marland Kitchen venlafaxine XR (EFFEXOR-XR) 150 MG 24 hr capsule   Oral   Take 1 capsule (150 mg total) by mouth daily.   90 capsule   1     HOLD FILLING UNTIL PATIENT CALLS FOR REFILL.   Marland Kitchen zolpidem (AMBIEN) 10 MG tablet   Oral   Take 1 tablet by mouth at bedtime.           Allergies Penicillins  Family History  Problem Relation Age of Onset  . COPD Mother   . Lung cancer Father   . Kidney disease Neg Hx   . Prostate cancer Neg Hx     Social History Social History  Substance Use Topics  . Smoking status: Former Research scientist (life sciences)  . Smokeless tobacco: Never Used     Comment: quit 18 years ago  . Alcohol Use: No    Review of Systems Constitutional: No fever/chills Eyes: No visual changes. ENT: No sore throat. Cardiovascular: Denies chest pain. Respiratory: Denies shortness of breath. Gastrointestinal: No abdominal pain.  No nausea, no vomiting.  No diarrhea.  No constipation. Genitourinary: Negative for  dysuria. Musculoskeletal: Negative for back pain. Skin: Negative for rash. Neurological: Negative for headaches, focal weakness or numbness.  10-point ROS otherwise negative.  ____________________________________________   PHYSICAL EXAM:  VITAL SIGNS: ED  Triage Vitals  Enc Vitals Group     BP 09/04/15 1150 128/61 mmHg     Pulse Rate 09/04/15 1150 93     Resp 09/04/15 1150 24     Temp 09/04/15 1150 98 F (36.7 C)     Temp Source 09/04/15 1150 Oral     SpO2 09/04/15 1150 88 %     Weight 09/04/15 1150 136 lb (61.689 kg)     Height --      Head Cir --      Peak Flow --      Pain Score 09/04/15 1152 0     Pain Loc --      Pain Edu? --      Excl. in [place]? --     Constitutional: Alert and oriented 3. Well appearing and in no acute distress. Eyes: Conjunctivae are normal. PERRL. EOMI. Head: Atraumatic. Nose: No congestion/rhinnorhea. Mouth/Throat: Mucous membranes are moist.  Oropharynx non-erythematous. Neck: No stridor.  Ranges freely without any restriction or objective signs of pain. Cardiovascular: Normal rate, regular rhythm. Grossly normal heart sounds.  Respiratory: Normal respiratory effort.  No retractions. Lungs CTAB. Gastrointestinal: Soft and nontender. No distention. No abdominal bruits. No CVA tenderness. Musculoskeletal: No lower extremity tenderness nor edema.  No joint effusions. Neurologic:  Normal speech and language. No gross focal neurologic deficits are appreciated.  Skin:  Skin is warm, dry and intact. No rash noted. Psychiatric: Mood and affect are normal. Speech and behavior are normal.  However, when asked what happened to get with Lelan Pons was naked in a very messy home the patient becomes tangential and was not able to give a straight answer.  ____________________________________________   LABS (all labs ordered are listed, but only abnormal results are displayed)  Labs Reviewed  COMPREHENSIVE METABOLIC PANEL - Abnormal; Notable for the following:     Glucose, Bld 156 (*)    Calcium 8.5 (*)    Albumin 2.7 (*)    ALT 12 (*)    All other components within normal limits  CBC - Abnormal; Notable for the following:    RBC 3.52 (*)    Hemoglobin 10.2 (*)    HCT 31.5 (*)    RDW 15.5 (*)    All other components within normal limits  URINALYSIS COMPLETEWITH MICROSCOPIC ([facility] ONLY) - Abnormal; Notable for the following:    Color, Urine YELLOW (*)    APPearance CLEAR (*)    Leukocytes, UA TRACE (*)    Squamous Epithelial / LPF 0-5 (*)    All other components within normal limits  ACETAMINOPHEN LEVEL - Abnormal; Notable for the following:    Acetaminophen (Tylenol), Serum <10 (*)    All other components within normal limits  SALICYLATE LEVEL  ETHANOL  TROPONIN I  URINE DRUG SCREEN, QUALITATIVE ([facility] ONLY)   ____________________________________________  EKG  ED ECG REPORT I, Doran Stabler, the attending physician, personally viewed and interpreted this ECG.   Date: 09/04/2015  EKG Time: 1632  Rate: 83  Rhythm: normal sinus rhythm  Axis: Normal  Intervals:none  ST&T Change: No ST segment elevation or depression. No abnormal T-wave inversion.  ____________________________________________  RADIOLOGY   CT Head Wo Contrast (Final result) Result time: 09/04/15 17:39:45   Final result by Rad Results In Interface (09/04/15 17:39:45)   Narrative:   CLINICAL DATA: Altered mental status  EXAM: CT HEAD WITHOUT CONTRAST  TECHNIQUE: Contiguous axial images were obtained from the base of the skull through the vertex without intravenous contrast.  COMPARISON: MRI brain dated 08/20/2015  FINDINGS: No evidence of parenchymal hemorrhage or extra-axial fluid collection. No mass lesion, mass effect, or midline shift.  No CT evidence of acute infarction.  Subcortical white matter and periventricular small vessel ischemic changes.  Mild age related atrophy. No ventriculomegaly.  The visualized paranasal sinuses  are essentially clear. The mastoid air cells are unopacified.  No evidence of calvarial fracture.  IMPRESSION: No evidence of acute intracranial abnormality.  Age related atrophy with small vessel ischemic changes.   Electronically Signed By: Julian Hy M.D. On: 09/04/2015 17:39          DG Chest 2 View (Final result) Result time: 09/04/15 16:32:21   Final result by Rad Results In Interface (09/04/15 16:32:21)   Narrative:   CLINICAL DATA: Altered mental status. Lung cancer.  EXAM: CHEST 2 VIEW  COMPARISON: 08/30/2015 chest radiograph.  FINDINGS: Stable cardiomediastinal silhouette with normal heart size. No pneumothorax. No pleural effusion. No appreciable change in left hilar fullness and left costophrenic angle pleural thickening. No acute consolidative airspace disease.  IMPRESSION: 1. No acute consolidative airspace disease. 2. No appreciable change in nonspecific left hilar fullness and left costophrenic angle pleural thickening, which could represent post treatment change and/or tumor recurrence, see 08/19/2015 chest CT and 08/05/2015 PET-CT reports for further details.   Electronically Signed By: Ilona Sorrel M.D. On: 09/04/2015 16:32    ____________________________________________   PROCEDURES   ____________________________________________   INITIAL IMPRESSION / ASSESSMENT AND PLAN / ED COURSE  Pertinent labs & imaging results that were available during my care of the patient were reviewed by me and considered in my medical decision making (see chart for details).  Filled the patient's condition is likely psychiatric in origin. However, given age will pursue further workup for an infectious source for his altered mental status. Placed on involuntary commitment. The patient is aware of this as well as a sister.  ----------------------------------------- 7:00 PM on  09/04/2015 -----------------------------------------  Patient with very reassuring medical workup. Seen by Dr. Weber Cooks and recommends admission to geriatric psychiatry. ____________________________________________   FINAL CLINICAL IMPRESSION(S) / ED DIAGNOSES  Altered mental status.    NEW MEDICATIONS STARTED DURING THIS VISIT:  New Prescriptions   No medications on file     Note:  This document was prepared using Dragon voice recognition software and may include unintentional dictation errors.    Orbie Pyo, MD 09/04/15 1900

## 2015-09-04 NOTE — BH Assessment (Signed)
Assessment Note  Timothy Ali is an 82 y.o. male who presents to the ER due to having changes in his behaviors. On today, the patient's sister went to his home to check on him. Patient came to the door with no clothes on. Per the report of the sister, his house wasn't clean. He had catheters throughout the house, alone with urine. Patient has been paranoid; he believes people have put devices in his refrigerator to spy on him. He's talking to people that aren't present and making references to things that have happened "many years ago." Prior to this, the patient was living independently with no problems. He cooked, clean and drove his car to places he needed to go to.  Sisters also, states, his behaviors has worsened over the course of two weeks to approximately month. Patient lives alone and family have limited information about how he's. On this past weekend, the patient was admitted to the ER due to wandering around the hospital. He was looking for his doctor to get his medications refilled. The office he attends was closed.  Patient has ran out of his medications and they are not due until June. Family believes he was taking the wrong, due to being confused. They don't believe he was deliberately taking them wrong. Family states he was like this before, in 2007 and it resulted in him being admitted to [facility].   Per the patient reports, he wants to go home. He don't know why he is in the hospital and he's okay.   When at the home, he uses oxygen. He has lung cancer and COPD. He was able to complete his ADL's independently.   Diagnosis: Major Depressive Disorder with Psychotic features.  Past Medical History:  Past Medical History  Diagnosis Date  . Major depressive disorder, recurrent, severe with psychotic features ([place])   . Persistent disorder of initiating or maintaining sleep   . Generalized anxiety disorder   . Major depressive disorder, recurrent, severe with psychotic  features ([place])   . Persistent disorder of initiating or maintaining sleep   . Asthma   . Cancer ([place])     Left lung  . COPD (chronic obstructive pulmonary disease) [facility])     Past Surgical History  Procedure Laterality Date  . Hemorroidectomy    . Lung surgery      Family History:  Family History  Problem Relation Age of Onset  . COPD Mother   . Lung cancer Father   . Kidney disease Neg Hx   . Prostate cancer Neg Hx     Social History:  reports that he has quit smoking. He has never used smokeless tobacco. He reports that he does not drink alcohol or use illicit drugs.  Additional Social History:  Alcohol / Drug Use Pain Medications: See PTA Prescriptions: See PTA Over the Counter: See PTA History of alcohol / drug use?: No history of alcohol / drug abuse Longest period of sobriety (when/how long): Reports of no use Negative Consequences of Use:  (Reports of no use) Withdrawal Symptoms:  (Reports of no use)  CIWA: CIWA-Ar BP: 128/61 mmHg Pulse Rate: 93 COWS:    Allergies:  Allergies  Allergen Reactions  . Penicillins Other (See Comments)    Reaction:  Unknown     Home Medications:  (Not in a hospital admission)  OB/GYN Status:  No LMP for male patient.  General Assessment Data Location of Assessment: [facility] ED TTS Assessment: In system Is this a Tele or  Face-to-Face Assessment?: Face-to-Face Is this an Initial Assessment or a Re-assessment for this encounter?: Initial Assessment Marital status: Widowed [place] name: n/a Is patient pregnant?: No Pregnancy Status: No Living Arrangements: Alone Can pt return to current living arrangement?: No Admission Status: Voluntary Is patient capable of signing voluntary admission?: Yes Referral Source: Self/Family/Friend Insurance type: n/a  Medical Screening Exam ([place]) Medical Exam completed: Yes  Crisis Care Plan Living Arrangements: Alone Legal Guardian: Other: (None) Name of Psychiatrist: None  (Psychiatrist no longer) Name of Therapist: none  Education Status Is patient currently in school?: No Current Grade: n/a Highest grade of school patient has completed: 12th Name of school: Management consultant person: n/a  Risk to self with the past 6 months Suicidal Ideation: No Has patient been a risk to self within the past 6 months prior to admission? : No Suicidal Intent: No Has patient had any suicidal intent within the past 6 months prior to admission? : No Is patient at risk for suicide?: No Suicidal Plan?: No Has patient had any suicidal plan within the past 6 months prior to admission? : No Access to Means: No What has been your use of drugs/alcohol within the last 12 months?: Reports of none Previous Attempts/Gestures: No How many times?: 0 Other Self Harm Risks: Reports of none Triggers for Past Attempts: Other (Comment) (Off medications) Intentional Self Injurious Behavior: None Family Suicide History: No Recent stressful life event(s): Other (Comment) (Off medications) Persecutory voices/beliefs?: No Depression: Yes Depression Symptoms: Feeling angry/irritable, Feeling worthless/self pity, Isolating Substance abuse history and/or treatment for substance abuse?: No Suicide prevention information given to non-admitted patients: Not applicable  Risk to Others within the past 6 months Homicidal Ideation: No Does patient have any lifetime risk of violence toward others beyond the six months prior to admission? : No Thoughts of Harm to Others: No Current Homicidal Intent: No Current Homicidal Plan: No Access to Homicidal Means: No Identified Victim: Reports of none History of harm to others?: No Assessment of Violence: None Noted Violent Behavior Description: Reports of none Does patient have access to weapons?: No Criminal Charges Pending?: No Does patient have a court date: No Is patient on probation?: No  Psychosis Hallucinations: None noted Delusions: None  noted  Mental Status Report Appearance/Hygiene: Unremarkable Eye Contact: Good Motor Activity: Unable to assess (Patient is laying in the) Speech: Logical/coherent Level of Consciousness: Alert Mood: Anxious, Sad, Irritable, Pleasant, Helpless Affect: Anxious, Sad, Labile Anxiety Level: Moderate Thought Processes: Coherent, Relevant Judgement: Impaired Orientation: Person, Place, Appropriate for developmental age Obsessive Compulsive Thoughts/Behaviors: Minimal  Cognitive Functioning Concentration: Normal Memory: Recent Intact, Remote Intact IQ: Average Insight: Poor Impulse Control: Poor Appetite: Fair Weight Loss: 0 Weight Gain: 0 Sleep: No Change Total Hours of Sleep:  (Unknown) Vegetative Symptoms: None  ADLScreening [facility] Assessment Services) Patient's cognitive ability adequate to safely complete daily activities?: Yes Patient able to express need for assistance with ADLs?: Yes Independently performs ADLs?: Yes (appropriate for developmental age)  Prior Inpatient Therapy Prior Inpatient Therapy: Yes Prior Therapy Dates: 04/2005 Prior Therapy Facilty/Provider([facility]): [facility] Reason for Treatment: Paranoid State  Prior Outpatient Therapy Prior Outpatient Therapy: Yes Prior Therapy Dates: Currently Prior Therapy Facilty/Provider(s): [facility] Reason for Treatment: Major depressive disorder, recurrent, severe with psychotic features  Does patient have an ACCT team?: No Does patient have Intensive In-House Services?  : No Does patient have [facility] services? : No Does patient have P4CC services?: No  ADL Screening (condition at time of admission) Patient's  cognitive ability adequate to safely complete daily activities?: Yes Patient able to express need for assistance with ADLs?: Yes Independently performs ADLs?: Yes (appropriate for developmental age)       Abuse/Neglect Assessment (Assessment to be complete while patient is alone) Physical  Abuse: Denies Verbal Abuse: Denies Sexual Abuse: Denies Exploitation of patient/patient's resources: Denies Self-Neglect: Denies Values / Beliefs Cultural Requests During Hospitalization: None Spiritual Requests During Hospitalization: None Consults Spiritual Care Consult Needed: No Social Work Consult Needed: No Regulatory affairs officer (For Healthcare) Does patient have an advance directive?: No    Additional Information 1:1 In Past 12 Months?: No CIRT Risk: No Elopement Risk: No Does patient have medical clearance?: Yes  Child/Adolescent Assessment Running Away Risk: Denies (Patient is adult)  Disposition:  Disposition Initial Assessment Completed for this Encounter: Yes Disposition of Patient: Other dispositions (ER MD ordered Psych Consult) Other disposition(s): Other (Comment) (ER MD ordered Psych Consult)  On Site Evaluation by:   Reviewed with Physician:    Gunnar Fusi MS, LCAS, [place], [place], [place] Therapeutic Triage Specialist 09/04/2015 6:57 PM

## 2015-09-04 NOTE — ED Notes (Signed)
Gave patient  Crackers and ginger ale.

## 2015-09-05 MED ORDER — TAMSULOSIN HCL 0.4 MG PO CAPS
ORAL_CAPSULE | ORAL | Status: AC
  Administered 2015-09-05: 0.4 mg via ORAL
  Filled 2015-09-05: qty 1

## 2015-09-05 NOTE — ED Notes (Signed)
Red Rubber self catheter kit to pt, pt to toilet without difficulty noted, pt declined offers for help, apparent lack of complications with pt conducted procedure

## 2015-09-05 NOTE — ED Notes (Signed)
Ed tech gave Patient red rubber straight cath. Patient administers it himself. AS

## 2015-09-05 NOTE — ED Provider Notes (Signed)
-----------------------------------------   6:59 AM on 09/05/2015 -----------------------------------------   Blood pressure 107/55, pulse 80, temperature 98 F (36.7 C), temperature source Oral, resp. rate 20, weight 136 lb (61.689 kg), SpO2 98 %.  The patient had no acute events since last update.  Calm and cooperative at this time.  [facility] ger[facility] psych facility is assessing patient for possible placement. Disposition is pending per Psychiatry/Behavioral Medicine team recommendations.     Paulette Blanch, MD 09/05/15 0700

## 2015-09-05 NOTE — ED Notes (Signed)
Pt states he is on 3L O2 via n/c at home continuously.  N/C found on the floor .  N/C placed on patient at 3L O2 at this time.

## 2015-09-05 NOTE — ED Notes (Signed)
Pt given protein shake and drank 8oz.

## 2015-09-05 NOTE — BH Assessment (Addendum)
Writer followed up with the Geriatric Placement Referrals;    Rosana Hoes [phone]), No Beds   Mikel Cella [phone]), Pending Review   [facility]([phone]), Unable to reach anyone   Boykin Nearing ([phone]), Pending review   Caryl Never [phone]), Unable to reach anyone   Mayer Camel ([phone]), Declined due to medical acuity   [facility] ([phone]), declined due to being on oxygen.

## 2015-09-05 NOTE — ED Notes (Signed)
Pt to bathroom to straight cath himself

## 2015-09-06 NOTE — ED Provider Notes (Addendum)
-----------------------------------------   6:56 AM on 09/06/2015 -----------------------------------------   Blood pressure 130/65, pulse 78, temperature 97.4 F (36.3 C), temperature source Oral, resp. rate 20, weight 61.689 kg, SpO2 92 %.  The patient had no acute events since last update.  Calm and cooperative at this time.  Disposition is pending placement at an appropriate facility.   Hinda Kehr, MD 09/06/15 [phone]

## 2015-09-06 NOTE — ED Notes (Signed)
BEHAVIORAL HEALTH ROUNDING Patient sleeping: Yes.   Patient alert and oriented: not applicable SLEEPING Behavior appropriate: Yes.  ; If no, describe: SLEEPING Nutrition and fluids offered: No SLEEPING Toileting and hygiene offered: NoSLEEPING Sitter present: not applicable, Q 15 min safety rounds and observation. Law enforcement present: Yes ODS 

## 2015-09-06 NOTE — ED Notes (Signed)
IVC 

## 2015-09-06 NOTE — ED Notes (Signed)

## 2015-09-06 NOTE — ED Notes (Signed)
Pt requesting an extension for his oxygen cord - states it pulls and is hurting his nose. Extension placed.

## 2015-09-06 NOTE — BH Assessment (Addendum)
Writer followed up with the Geriatric Placement Referrals;    [facility] ([phone]), No Beds   [facility] ([phone]), Information refaxed, Pending review   [facility] ([phone]), Declined due to oxygen   [facility] [phone]), Patient declined   [facility] [phone]), Unable to reach anyone   [facility] ([phone]), Declined due to medical acuity   [facility] ([phone]), declined due to being on oxygen.

## 2015-09-07 NOTE — ED Notes (Signed)

## 2015-09-07 NOTE — ED Notes (Signed)
BEHAVIORAL HEALTH ROUNDING Patient sleeping: Yes.   Patient alert and oriented: eyes closed  Appears to be asleep Behavior appropriate: Yes.  ; If no, describe:  Nutrition and fluids offered:   Sleeping  Toileting and hygiene offered: sleeping Sitter present: q 15 minute observations and security monitoring Law enforcement present: yes  ODS

## 2015-09-07 NOTE — ED Provider Notes (Signed)
-----------------------------------------   8:19 AM on 09/07/2015 -----------------------------------------   Blood pressure 128/84, pulse 94, temperature 97.6 F (36.4 C), temperature source Oral, resp. rate 18, weight 136 lb (61.689 kg), SpO2 93 %.  The patient had no acute events since last update.  Calm and cooperative at this time.    Patient meets inpatient criteria awaiting placement at this time.     Loney Hering, MD 09/07/15 [phone]

## 2015-09-07 NOTE — ED Notes (Signed)
Pt given meal tray,assisted pt with opening everything

## 2015-09-07 NOTE — ED Notes (Signed)
BEHAVIORAL HEALTH ROUNDING Patient sleeping: No. Patient alert and oriented: yes Behavior appropriate: Yes.  ; If no, describe:  Nutrition and fluids offered: yes Toileting and hygiene offered: Yes  Sitter present: q15 minute observations and security  monitoring Law enforcement present: Yes  ODS   pts sister Kathaleen Bury has visited  [phone]   [phone]

## 2015-09-07 NOTE — ED Notes (Signed)
BEHAVIORAL HEALTH ROUNDING Patient sleeping: Yes.   Patient alert and oriented: eyes closed  Resting quietly Behavior appropriate: Yes.  ; If no, describe:  Nutrition and fluids offered:  Eyes closed Toileting and hygiene offered:  Eyes closed  Sitter present: q 15 minute observations and security monitoring Law enforcement present: yes  ODS

## 2015-09-07 NOTE — ED Notes (Signed)
Am meds administered as ordered  Assessment completed  He denies pain  Continue to monitor

## 2015-09-07 NOTE — ED Notes (Signed)
Pt calling out to staff, pt states "linda" is here, pt angry that Vaughan Basta put pt in here, pt "ready to go home"  Pt given requested red rubber kit to self catheter

## 2015-09-07 NOTE — ED Notes (Signed)
BEHAVIORAL HEALTH ROUNDING Patient sleeping: Yes.   Patient alert and oriented: eyes closed  Appears to be asleep Behavior appropriate: Yes.  ; If no, describe:  Nutrition and fluids offered: sleeping Toileting and hygiene offered: sleeping Sitter present: q 15 minute observations and security monitoring Law enforcement present: yes  ODS

## 2015-09-07 NOTE — ED Notes (Signed)
BEHAVIORAL HEALTH ROUNDING Patient sleeping: Yes.   Patient alert and oriented: eyes closed  Appears to be asleep Behavior appropriate: Yes.  ; If no, describe:  Nutrition and fluids offered: sleeping   Toileting and hygiene offered: sleeping Sitter present: q 15 minute observations and security monitoring Law enforcement present: yes  ODS

## 2015-09-07 NOTE — ED Notes (Signed)
He is sitting on the side of the bed drinking some coffee  NAD assessed  "I am ready to go home - now if you will just take me around the building to my car then I will go home and not bother anybody."  Pt reassured of his safety here and that we are awaiting placement for him at a hospital with a geripsych unit  "Oh I am all right."

## 2015-09-07 NOTE — ED Notes (Signed)
ED BHU [place] Is the patient under IVC or is there intent for IVC: Yes.   Is the patient medically cleared: Yes.   Is there vacancy in the ED BHU: Yes.   Is the population mix appropriate for patient:   No  He is on chronic o2 Is the patient awaiting placement in inpatient or outpatient setting: Yes.  inpt geripsych unit placement  Has the patient had a psychiatric consult: Yes.   Survey of unit performed for contraband, proper placement and condition of furniture, tampering with fixtures in bathroom, shower, and each patient room: Yes.  ; Findings:  APPEARANCE/BEHAVIOR Calm and cooperative NEURO ASSESSMENT Orientation: oriented to self   Denies pain Hallucinations: No.None noted (Hallucinations) Speech: Normal Gait: normal RESPIRATORY ASSESSMENT Even  Unlabored respirations  - chronic o2 via [place]  3L CARDIOVASCULAR ASSESSMENT Pulses equal   regular rate  Skin warm and dry   GASTROINTESTINAL ASSESSMENT no GI complaint EXTREMITIES Full ROM  PLAN OF CARE Provide calm/safe environment. Vital signs assessed twice daily. ED BHU Assessment once each 12-hour shift. Collaborate with intake RN daily or as condition indicates. Assure the ED provider has rounded once each shift. Provide and encourage hygiene. Provide redirection as needed. Assess for escalating behavior; address immediately and inform ED provider.  Assess family dynamic and appropriateness for visitation as needed: Yes.  ; If necessary, describe findings:  Educate the patient/family about BHU procedures/visitation: Yes.  ; If necessary, describe findings:

## 2015-09-08 ENCOUNTER — Emergency Department: Payer: Medicare HMO

## 2015-09-08 DIAGNOSIS — F333 Major depressive disorder, recurrent, severe with psychotic symptoms: Secondary | ICD-10-CM | POA: Diagnosis not present

## 2015-09-08 NOTE — ED Notes (Signed)
Pt had fall due to getting up with O2 on face and O2 pulled him backwards.  Pt has red area on forehead.  EDP notified and CT for head put in.  Pt weak after fall, but answers questions appropriately for his baseline.  Pt states "I whacked my head real good".

## 2015-09-08 NOTE — Progress Notes (Signed)
TTS has spoken with the pts family who have present to the lobby with questions in regards to their brothers care. Writer has explained the importance of providing the pt with a confidential and safe environment, as the pts sisters have requested to take the pts belongings home with them. Pts family has requested to take the pts keys to help take care of his home and bills. Pts family was encouraged to begin the process of guardianship as they have stated that the pt has been diagnosis cancer and is quickly declining. The pts family have requested to be updated as much as possible in regards to any changes in the pts care. They have also requested to be call by the pts attending psychiatrist when ever possible.    09/08/2015 Con Memos, MS, [place], LPCA Therapeutic Triage Specialist

## 2015-09-08 NOTE — ED Notes (Signed)
ED BHU [place] Is the patient under IVC or is there intent for IVC: Yes.   Is the patient medically cleared: Yes.   Is there vacancy in the ED BHU: Yes.   Is the population mix appropriate for patient: no  Chronic o2    Is the patient awaiting placement in inpatient or outpatient setting: Yes.   Geriatric inpt admission Has the patient had a psychiatric consult: Yes.   Survey of unit performed for contraband, proper placement and condition of furniture, tampering with fixtures in bathroom, shower, and each patient room: Yes.  ; Findings:  APPEARANCE/BEHAVIOR Calm and cooperative NEURO ASSESSMENT Orientation: oriented to self and place  Denies pain Hallucinations: No.None noted (Hallucinations) Speech: Normal Gait: normal RESPIRATORY ASSESSMENT Even  Unlabored respirations  CARDIOVASCULAR ASSESSMENT Pulses equal   regular rate  Skin warm and dry   GASTROINTESTINAL ASSESSMENT no GI complaint EXTREMITIES Full ROM  PLAN OF CARE Provide calm/safe environment. Vital signs assessed twice daily. ED BHU Assessment once each 12-hour shift. Collaborate with intake RN daily or as condition indicates. Assure the ED provider has rounded once each shift. Provide and encourage hygiene. Provide redirection as needed. Assess for escalating behavior; address immediately and inform ED provider.  Assess family dynamic and appropriateness for visitation as needed: Yes.  ; If necessary, describe findings:  Educate the patient/family about BHU procedures/visitation: Yes.  ; If necessary, describe findings:

## 2015-09-08 NOTE — ED Notes (Signed)
BEHAVIORAL HEALTH ROUNDING Patient sleeping: Yes.   Patient alert and oriented: eyes closed  Appears to be asleep Behavior appropriate: Yes.  ; If no, describe:  Nutrition and fluids offered: sleeping Toileting and hygiene offered: sleeping Sitter present: q 15 minute observations and security monitoring Law enforcement present: yes  ODS

## 2015-09-08 NOTE — ED Notes (Signed)
Pt observed lying in bed - watching TV   Pt visualized with NAD  No verbalized needs or concerns at this time  1:1 sitter at bedside   Continue to monitor

## 2015-09-08 NOTE — ED Notes (Addendum)
Pt ambulatory to toilet with sitter. Pt stated he felt SOB so Alissa, EDT applied oxygen in restroom. Pt now resting in bed comfortably with oxygen on.

## 2015-09-08 NOTE — Consult Note (Signed)
[facility] Face-to-Face Psychiatry Consult   Reason for Consult:  Consult for this 82 year old man with a history of anxiety and depression and now dementia brought back into the hospital after family found him unable to care for himself Referring Physician:  Clearnce Hasten Patient Identification: Timothy Ali MRN:  [id number] Principal Diagnosis: Depression, major, recurrent, severe with psychosis ([place]) Diagnosis:   Patient Active Problem List   Diagnosis Date Noted  . Dementia [F03.90] 08/31/2015  . Generalized anxiety disorder [F41.1] 08/31/2015  . SOB (shortness of breath) [R06.02] 08/19/2015  . BPH (benign prostatic hypertrophy) with urinary retention [N40.0] 02/16/2015  . Metastatic lung cancer (metastasis from lung to other site) ([place]) [C34.90] 02/16/2015  . Lung cancer ([place]) [C34.90] 10/24/2014  . Acute on chronic respiratory failure ([place]) [J96.20] 10/23/2014  . Leucocytosis [D72.829] 10/23/2014  . COPD (chronic obstructive pulmonary disease) ([place]) [J44.9] 10/23/2014  . Carcinoma, lung ([place]) [C34.90] 10/23/2014  . Cancer of lung ([place]) [C34.90] 10/23/2014  . CAFL (chronic airflow limitation) ([place]) [J44.9] 08/01/2014  . Depression, major, recurrent, severe with psychosis ([place]) [F33.3] 08/01/2014  . H/O gastrointestinal disease [Z87.19] 08/01/2014  . Anxiety, generalized [F41.1] 08/01/2014  . H/O elevated lipids [Z86.39] 08/01/2014  . Insomnia, persistent [G47.00] 08/01/2014  . H/O malignant neoplasm [Z85.9] 08/01/2014  . Prostate disease [N42.9] 08/01/2014  . Macular degeneration [H35.30] 08/01/2014  . Obstruction of urinary tract [N13.9] 08/01/2014  . Chronic obstructive pulmonary disease ([place]) [J44.9] 08/01/2014  . Severe episode of recurrent major depressive disorder, with psychotic features ([place]) [F33.3] 08/01/2014    Total Time spent with patient: 20 minutes  Subjective:   Timothy Ali is a 82 y.o. male patient admitted with "I didn't do anything wrong".  Follow-up today  Tuesday the 23rd. Patient has no new complaints. Continues to feel bored. Insight is not extremely good. No threatening or agitated behavior. Eating reasonably well. We have not been able to find ?placement in part because of his oxygen dependence.  HPI:  Patient interviewed. Also spoke with his sister. Labs reviewed. Patient known to me from just being in the emergency room the other day. Old chart reviewed. 82 year old man with history of depression and anxiety who has recently been having more and more confusion. He was in the emergency room the other day after spell of confusion in the hospital. After going home apparently he has continued to be confused. He didn't get his medicines filled the way he was supposed to. Sisters went by to visit him and found him walking around naked with urine and feces on the floor confused and probably hallucinating. They feel that they can't take care of him and look over him anymore. Patient is not a very good historian. He is somewhat confused about his current situation doesn't remember how he got here.  Social history: Patient has been living by himself with some family supervision. Until recently he had been still driving. Has rapidly got more confused in the last few weeks.  Medical history: Patient has advanced lung cancer. Has gotten treatment for it. Severe COPD and lung problems. Weight loss. History of gastric reflux disease.  Substance abuse history: Patient denies that he's been drinking or abusing any drugs. Said he used to drink many years ago but quit.  Past Psychiatric History: Patient has been treated as an outpatient by psychiatrist in our office. He was receiving combination of antidepressants and antianxiety medicines. Recently a change in psychiatrist initiated some taper of his benzodiazepines. Patient rapidly ran out of his benzodiazepines and  was probably having some withdrawal. No known history of suicide attempts. I don't think he's had  inpatient psychiatric hospitalization in the past.  Risk to Self: Suicidal Ideation: No Suicidal Intent: No Is patient at risk for suicide?: No Suicidal Plan?: No Access to Means: No What has been your use of drugs/alcohol within the last 12 months?: Reports of none How many times?: 0 Other Self Harm Risks: Reports of none Triggers for Past Attempts: Other (Comment) (Off medications) Intentional Self Injurious Behavior: None Risk to Others: Homicidal Ideation: No Thoughts of Harm to Others: No Current Homicidal Intent: No Current Homicidal Plan: No Access to Homicidal Means: No Identified Victim: Reports of none History of harm to others?: No Assessment of Violence: None Noted Violent Behavior Description: Reports of none Does patient have access to weapons?: No Criminal Charges Pending?: No Does patient have a court date: No Prior Inpatient Therapy: Prior Inpatient Therapy: Yes Prior Therapy Dates: 04/2005 Prior Therapy Facilty[facility]Provider[facility]): [facility] Reason for Treatment: Paranoid State Prior Outpatient Therapy: Prior Outpatient Therapy: Yes Prior Therapy Dates: Currently Prior Therapy Facilty/Provider(s): [facility] Reason for Treatment: Major depressive disorder, recurrent, severe with psychotic features  Does patient have an ACCT team?: No Does patient have Intensive In-House Services?  : No Does patient have [facility] services? : No Does patient have P4CC services?: No  Past Medical History:  Past Medical History  Diagnosis Date  . Major depressive disorder, recurrent, severe with psychotic features ([place])   . Persistent disorder of initiating or maintaining sleep   . Generalized anxiety disorder   . Major depressive disorder, recurrent, severe with psychotic features ([place])   . Persistent disorder of initiating or maintaining sleep   . Asthma   . Cancer ([place])     Left lung  . COPD (chronic obstructive pulmonary disease) [facility])     Past Surgical  History  Procedure Laterality Date  . Hemorroidectomy    . Lung surgery     Family History:  Family History  Problem Relation Age of Onset  . COPD Mother   . Lung cancer Father   . Kidney disease Neg Hx   . Prostate cancer Neg Hx    Family Psychiatric  History: Family history of depression Social History:  History  Alcohol Use No     History  Drug Use No    Social History   Social History  . Marital Status: Widowed    Spouse Name: N/A  . Number of Children: N/A  . Years of Education: N/A   Social History Main Topics  . Smoking status: Former Research scientist (life sciences)  . Smokeless tobacco: Never Used     Comment: quit 18 years ago  . Alcohol Use: No  . Drug Use: No  . Sexual Activity: No   Other Topics Concern  . None   Social History Narrative   Additional Social History:    Allergies:   Allergies  Allergen Reactions  . Penicillins Other (See Comments)    Reaction:  Unknown     Labs:  No results found for this or any previous visit (from the past 48 hour(s)).  Current Facility-Administered Medications  Medication Dose Route Frequency Provider Last Rate Last Dose  . albuterol (PROVENTIL HFA;VENTOLIN HFA) 108 (90 Base) MCG/ACT inhaler 2 puff  2 puff Inhalation Q6H Gonzella Lex, MD   2 puff at 09/08/15 1158  . atorvastatin (LIPITOR) tablet 40 mg  40 mg Oral q1800 Gonzella Lex, MD   40 mg at 09/06/15  2122  . LORazepam (ATIVAN) tablet 1 mg  1 mg Oral TID Gonzella Lex, MD   1 mg at 09/08/15 1158  . mometasone-formoterol (DULERA) 100-5 MCG/ACT inhaler 2 puff  2 puff Inhalation BID Gonzella Lex, MD   2 puff at 09/08/15 1159  . pantoprazole (PROTONIX) EC tablet 40 mg  40 mg Oral Daily Gonzella Lex, MD   40 mg at 09/08/15 1158  . QUEtiapine (SEROQUEL) tablet 200 mg  200 mg Oral QHS Gonzella Lex, MD   200 mg at 09/07/15 2328  . tamsulosin (FLOMAX) capsule 0.4 mg  0.4 mg Oral QPC breakfast Gonzella Lex, MD   0.4 mg at 09/08/15 1158  . tiotropium (SPIRIVA) inhalation  capsule 18 mcg  18 mcg Inhalation Daily Gonzella Lex, MD   18 mcg at 09/08/15 1206  . venlafaxine XR (EFFEXOR-XR) 24 hr capsule 75 mg  75 mg Oral Q breakfast Gonzella Lex, MD   75 mg at 09/08/15 1159   Current Outpatient Prescriptions  Medication Sig Dispense Refill  . albuterol (PROVENTIL HFA;VENTOLIN HFA) 108 (90 BASE) MCG/ACT inhaler Inhale 2 puffs into the lungs every 4 (four) hours as needed for wheezing or shortness of breath.     Marland Kitchen atorvastatin (LIPITOR) 40 MG tablet Take 40 mg by mouth at bedtime.     Marland Kitchen BREO ELLIPTA 100-25 MCG/INH AEPB Inhale 1 puff into the lungs daily.     Marland Kitchen esomeprazole (NEXIUM) 40 MG capsule Take 40 mg by mouth daily.     Marland Kitchen guaiFENesin (MUCINEX) 600 MG 12 hr tablet Take 1 tablet (600 mg total) by mouth 2 (two) times daily. 20 tablet 0  . ipratropium-albuterol (DUONEB) 0.5-2.5 (3) MG/3ML SOLN Take 3 mLs by nebulization every 6 (six) hours as needed. 360 mL 1  . levofloxacin (LEVAQUIN) 500 MG tablet Take 1 tablet by mouth daily. Reported on 08/30/2015    . LORazepam (ATIVAN) 0.5 MG tablet Take 0.5 mg by mouth every 8 (eight) hours. Pt takes with a '1mg'$  tablet.    Marland Kitchen LORazepam (ATIVAN) 1 MG tablet Take 1 mg by mouth every 8 (eight) hours. Pt takes with a 0.'5mg'$  tablet.    . potassium chloride SA (K-DUR,KLOR-CON) 20 MEQ tablet Take 1 tablet (20 mEq total) by mouth daily. 30 tablet 3  . predniSONE (DELTASONE) 5 MG tablet Take 1 tablet by mouth as directed. Reported on 08/30/2015    . QUEtiapine (SEROQUEL XR) 300 MG 24 hr tablet Take 1 tablet (300 mg total) by mouth at bedtime. 90 tablet 1  . senna-docusate (SENOKOT-S) 8.6-50 MG tablet Take 1 tablet by mouth 2 (two) times daily. 30 tablet 0  . tamsulosin (FLOMAX) 0.4 MG CAPS capsule Take 0.4 mg by mouth daily after breakfast.     . tiotropium (SPIRIVA) 18 MCG inhalation capsule Place 18 mcg into inhaler and inhale daily.     Marland Kitchen venlafaxine XR (EFFEXOR-XR) 150 MG 24 hr capsule Take 1 capsule (150 mg total) by mouth daily. 90  capsule 1  . zolpidem (AMBIEN) 10 MG tablet Take 1 tablet by mouth at bedtime.      Musculoskeletal: Strength & Muscle Tone: decreased Gait & Station: ataxic Patient leans: N/A  Psychiatric Specialty Exam: Review of Systems  HENT: Negative.   Eyes: Negative.   Respiratory: Negative.   Cardiovascular: Negative.   Gastrointestinal: Negative.   Genitourinary: Positive for dysuria.  Musculoskeletal: Negative.   Skin: Negative.   Neurological: Positive for weakness.  Psychiatric/Behavioral: Positive for  depression, hallucinations and memory loss. Negative for suicidal ideas and substance abuse. The patient is nervous/anxious and has insomnia.     Blood pressure 121/74, pulse 90, temperature 98.1 F (36.7 C), temperature source Oral, resp. rate 16, weight 61.689 kg (136 lb), SpO2 95 %.Body mass index is 18.44 kg/(m^2).  General Appearance: Disheveled  Eye Contact::  Minimal  Speech:  Slurred  Volume:  Decreased  Mood:  Anxious  Affect:  Constricted  Thought Process:  Irrelevant  Orientation:  Full (Time, Place, and Person)  Thought Content:  Paranoid Ideation  Suicidal Thoughts:  No  Homicidal Thoughts:  No  Memory:  Immediate;   Good Recent;   Fair Remote;   Fair  Judgement:  Impaired  Insight:  Shallow  Psychomotor Activity:  Decreased  Concentration:  Poor  Recall:  Poor  Fund of Knowledge:Poor  Language: Poor  Akathisia:  No  Handed:  Right  AIMS (if indicated):     Assets:  Housing Social Support  ADL's:  Impaired  Cognition: Impaired,  Mild and Moderate  Sleep:      Treatment Plan Summary: Daily contact with patient to assess and evaluate symptoms and progress in treatment, Medication management and Plan Continue medicine. I will discuss with social work the appropriate placement options. If we can get him into an assisted living at this point that would be probably preferable. Reassured patient that we are trying to find a reasonable living compromise for him.  Continue to look into general psych units.  Disposition: Recommend psychiatric Inpatient admission when medically cleared.  Alethia Berthold, MD 09/08/2015 4:10 PM  Physical Exam

## 2015-09-08 NOTE — ED Notes (Signed)
BEHAVIORAL HEALTH ROUNDING Patient sleeping: No. Patient alert and oriented: yes Behavior appropriate: Yes.  ; If no, describe:  Nutrition and fluids offered: yes Toileting and hygiene offered: Yes  Sitter present: q15 minute observations and security  monitoring Law enforcement present: Yes  ODS  

## 2015-09-08 NOTE — ED Provider Notes (Signed)
-----------------------------------------   12:23 AM on 09/08/2015 -----------------------------------------  The nurses reported to me that the patient was found on the floor.  He was alert and at his baseline reported that he "whacked his head real good".  I will obtain a noncontrast head CT but the patient is in no acute distress and has no complaints other than forehead pain.  ----------------------------------------- 5:27 AM on 09/08/2015 -----------------------------------------  Ct Head Wo Contrast  09/08/2015  CLINICAL DATA:  Fall.  Found on floor. EXAM: CT HEAD WITHOUT CONTRAST TECHNIQUE: Contiguous axial images were obtained from the base of the skull through the vertex without intravenous contrast. COMPARISON:  Head CT 09/04/2015 FINDINGS: No change from prior exam. No intracranial hemorrhage, mass effect, or midline shift. No hydrocephalus. The basilar cisterns are patent. No evidence of territorial infarct. Stable degree of atrophy and chronic small vessel ischemia. No intracranial fluid collection. Calvarium is intact. Included paranasal sinuses and mastoid air cells are well aerated. IMPRESSION: No acute intracranial abnormality. Stable atrophy and chronic small vessel ischemia. Electronically Signed   By: Jeb Levering M.D.   On: 09/08/2015 02:48    Blood pressure 115/73, pulse 94, temperature 97.5 F (36.4 C), temperature source Oral, resp. rate 18, weight 61.689 kg, SpO2 92 %.  The patient had no acute events since last update.  Calm and cooperative at this time.  Disposition is pending per Psychiatry/Behavioral Medicine team recommendations.     Hinda Kehr, MD 09/08/15 [phone]

## 2015-09-08 NOTE — ED Notes (Signed)
In and out catheter performed by pt with this RN assist and bedside sitter assist. Pt stated relief from catheterization.

## 2015-09-08 NOTE — ED Notes (Signed)
Pt sitter states that pt ate all of his breakfast tray and lunch tray.Pt was resting comfortably when his dinner tray arrived so sitter left it in the room for pt to eat when he is more aroused.

## 2015-09-09 ENCOUNTER — Inpatient Hospital Stay: Payer: Medicare HMO | Admitting: Oncology

## 2015-09-09 ENCOUNTER — Emergency Department: Payer: Medicare HMO

## 2015-09-09 ENCOUNTER — Inpatient Hospital Stay: Payer: Medicare HMO

## 2015-09-09 DIAGNOSIS — F333 Major depressive disorder, recurrent, severe with psychotic symptoms: Secondary | ICD-10-CM | POA: Diagnosis not present

## 2015-09-09 LAB — CBC WITH DIFFERENTIAL/PLATELET
BASOS ABS: 0 10*3/uL (ref 0–0.1)
Basophils Relative: 1 %
Eosinophils Absolute: 0.2 10*3/uL (ref 0–0.7)
Eosinophils Relative: 5 %
HEMATOCRIT: 32 % — AB (ref 40.0–52.0)
Hemoglobin: 10.6 g/dL — ABNORMAL LOW (ref 13.0–18.0)
LYMPHS ABS: 0.5 10*3/uL — AB (ref 1.0–3.6)
LYMPHS PCT: 13 %
MCH: 29 pg (ref 26.0–34.0)
MCHC: 33 g/dL (ref 32.0–36.0)
MCV: 87.9 fL (ref 80.0–100.0)
MONO ABS: 0.6 10*3/uL (ref 0.2–1.0)
MONOS PCT: 17 %
NEUTROS ABS: 2.3 10*3/uL (ref 1.4–6.5)
Neutrophils Relative %: 64 %
Platelets: 181 10*3/uL (ref 150–440)
RBC: 3.64 MIL/uL — ABNORMAL LOW (ref 4.40–5.90)
RDW: 15.7 % — AB (ref 11.5–14.5)
WBC: 3.6 10*3/uL — ABNORMAL LOW (ref 3.8–10.6)

## 2015-09-09 LAB — BASIC METABOLIC PANEL
ANION GAP: 4 — AB (ref 5–15)
BUN: 18 mg/dL (ref 6–20)
CALCIUM: 8.4 mg/dL — AB (ref 8.9–10.3)
CO2: 32 mmol/L (ref 22–32)
Chloride: 103 mmol/L (ref 101–111)
Creatinine, Ser: 0.64 mg/dL (ref 0.61–1.24)
GFR calc Af Amer: >60 mL/min (ref >60)
GFR calc non Af Amer: >60 mL/min (ref >60)
GLUCOSE: 100 mg/dL — AB (ref 65–99)
POTASSIUM: 3.9 mmol/L (ref 3.5–5.1)
Sodium: 139 mmol/L (ref 135–145)

## 2015-09-09 MED ORDER — LEVOFLOXACIN 750 MG PO TABS: 750.0000 mg | ORAL_TABLET | Freq: Every day | ORAL | Status: DC

## 2015-09-09 MED ORDER — LORAZEPAM 1 MG PO TABS: 1.0000 mg | ORAL_TABLET | Freq: Three times a day (TID) | ORAL | Status: DC | PRN

## 2015-09-09 MED ORDER — LEVOFLOXACIN 750 MG PO TABS
750.0000 mg | ORAL_TABLET | Freq: Every day | ORAL | Status: DC
  Administered 2015-09-09: 750 mg via ORAL
  Filled 2015-09-09: qty 1

## 2015-09-09 NOTE — ED Notes (Signed)
PT returned pt to room. Pt sitting on side of bed.

## 2015-09-09 NOTE — ED Notes (Signed)
Respiratory called for incentive spirometer, Jenny Reichmann stated he would bring on to room 22 for pt to use q4h per Dr. Beather Arbour.
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
## 2015-09-09 NOTE — ED Provider Notes (Addendum)
-----------------------------------------   6:35 AM on 09/09/2015 -----------------------------------------   Blood pressure 106/66, pulse 90, temperature 97.6 F (36.4 C), temperature source Oral, resp. rate 18, weight 136 lb (61.689 kg), SpO2 94 %.  Patient noted to have dry cough overnight. Lungs are clear on auscultation without rhonchi or wheezing. Patient is afebrile with saturations on 3 L continuous oxygen at 94%. Will repeat CBC, metabolic panel as well as repeat chest x-ray. Incentive spirometer ordered for cough likely secondary to atelectasis.  Oncoming provider to follow-up on these results. Calm and cooperative at this time.  Disposition is pending per Psychiatry/Behavioral Medicine team recommendations.  ----------------------------------------- 7:31 AM on 09/09/2015 -----------------------------------------  I have reviewed results of laboratory as well as imaging study which is concerning for developing infiltrate. Will start patient on daily Levaquin 750 mg PO.     Paulette Blanch, MD 09/09/15 [place], MD 09/09/15 [phone]

## 2015-09-09 NOTE — ED Notes (Signed)
Pt given breakfast tray. Pt not hungry, but is drinking juice. Pt sitting on side of bed.

## 2015-09-09 NOTE — ED Notes (Signed)
Pt given lunch tray. Pt is eating at this time.

## 2015-09-09 NOTE — ED Notes (Signed)
Pt given bath and face shaved.

## 2015-09-09 NOTE — Consult Note (Signed)
  Psychiatry: Follow-up for this 82 year old gentleman who appears to have developed enough dementia to make it unsafe for him to live completely independently. Patient has no new complaints today. He continues to feel achy and bad and down especially related to his cancer. He is frustrated at still being in the hospital. I had been hoping we could get him to a geriatric psychiatry unit but that appears to not be likely given his illness. Everyplace has turned him down. I am discussing now with staff that if we can simply get him placed anywhere were he can get safely taking care of that would be just as good at this point. Patient mainly needs safe care for his medical condition. No changed any medications at this point. Reviewed the whole situation with ER staff and TTS. Supportive counseling for the patient. Pretty much psychiatrically stable with only a mild degree of dementia at this point.

## 2015-09-09 NOTE — ED Notes (Signed)
Per sitter at bedside, pt used an in and out cath to void. Sitter states pt is now resting back in bed with nasal cannula on.

## 2015-09-09 NOTE — ED Notes (Signed)
Pt sitting on side of bed talking with sitter

## 2015-09-09 NOTE — Consult Note (Signed)
  Psychiatry: Case reviewed with emergency room staff again. No hospital that we have contacted is willing to accept this patient. Social work does not believe that he can be placed anywhere. He refuses the option of going to a family care home willingly. Therefore we have no option at this point but to leave him in the emergency room or to discharge him. Patient is not benefiting from emergency room stay and is at risk of developing a hospital-acquired infection and has a diminished quality of life in the hospital. It is his preference to be discharged home. Therefore he is being discharged from the hospital emergency room. Discontinue IVC. Diagnosis dementia mild.

## 2015-09-09 NOTE — Care Management Note (Signed)
Case Management Note  Patient Details  Name: Timothy Ali MRN: [id number] Date of Birth: 1929-08-14  Subjective/Objective:   Spoke to pt. PCP office in anticipation of [facility] needed. Turns out the pt. Is fairly independent, and so does not qualify for [facility] services at this time. He is well known to the staff, and he was last seen there 07/16/15. Dr Hall Busing has no agency preference and will gladly sign anything ordered if the need arises.                 Action/Plan:   Expected Discharge Date:                  Expected Discharge Plan:     In-House Referral:     Discharge planning Services     Post Acute Care Choice:    Choice offered to:     DME Arranged:    DME Agency:     HH Arranged:    [place] Agency:     Status of Service:     Medicare Important Message Given:    Date Medicare IM Given:    Medicare IM give by:    Date Additional Medicare IM Given:    Additional Medicare Important Message give by:     If discussed at [place] of Stay Meetings, dates discussed:    Additional Comments:  Beau Fanny, RN 09/09/2015, 2:33 PM

## 2015-09-09 NOTE — ED Provider Notes (Signed)
Filed Vitals:   09/09/15 0624 09/09/15 1656  BP: 106/66 117/84  Pulse: 90 92  Temp: 97.6 F (36.4 C) 98.5 F (36.9 C)  Resp: 18 14    Labs Reviewed  COMPREHENSIVE METABOLIC PANEL - Abnormal; Notable for the following:    Glucose, Bld 156 (*)    Calcium 8.5 (*)    Albumin 2.7 (*)    ALT 12 (*)    All other components within normal limits  CBC - Abnormal; Notable for the following:    RBC 3.52 (*)    Hemoglobin 10.2 (*)    HCT 31.5 (*)    RDW 15.5 (*)    All other components within normal limits  URINALYSIS COMPLETEWITH MICROSCOPIC ([facility] ONLY) - Abnormal; Notable for the following:    Color, Urine YELLOW (*)    APPearance CLEAR (*)    Leukocytes, UA TRACE (*)    Squamous Epithelial / LPF 0-5 (*)    All other components within normal limits  ACETAMINOPHEN LEVEL - Abnormal; Notable for the following:    Acetaminophen (Tylenol), Serum <10 (*)    All other components within normal limits  URINE DRUG SCREEN, QUALITATIVE ([facility] ONLY) - Abnormal; Notable for the following:    Tricyclic, Ur Screen POSITIVE (*)    Benzodiazepine, Ur Scrn POSITIVE (*)    All other components within normal limits  CBC WITH DIFFERENTIAL/PLATELET - Abnormal; Notable for the following:    WBC 3.6 (*)    RBC 3.64 (*)    Hemoglobin 10.6 (*)    HCT 32.0 (*)    RDW 15.7 (*)    Lymphs Abs 0.5 (*)    All other components within normal limits  BASIC METABOLIC PANEL - Abnormal; Notable for the following:    Glucose, Bld 100 (*)    Calcium 8.4 (*)    Anion gap 4 (*)    All other components within normal limits  CULTURE, BLOOD (ROUTINE X 2)  CULTURE, BLOOD (ROUTINE X 2)  SALICYLATE LEVEL  ETHANOL  TROPONIN I   Patient's been cleared by psychiatry for discharge. He may be developing pneumonia and has been started on Levaquin. He is medically and psychiatrically cleared for discharge.  Earleen Newport, MD 09/09/15 [phone]

## 2015-09-09 NOTE — ED Notes (Signed)
Supper tray placed in room. Pt stated he wasn't hungry.

## 2015-09-09 NOTE — ED Notes (Signed)
Pt sleeping on back on in-pt bed. Equal rise and fall of chest noted. Pt mouth open, nasal cannula on 3L. Pt sitter at bedside.

## 2015-09-09 NOTE — ED Notes (Signed)
Pt transported to x-ray via wheelchair with ODS and sitter.

## 2015-09-09 NOTE — ED Notes (Signed)
Pt sitting on side of bed.

## 2015-09-09 NOTE — Discharge Instructions (Signed)
Community-Acquired Pneumonia, Adult Pneumonia is an infection of the lungs. There are different types of pneumonia. One type can develop while a person is in a hospital. A different type, called community-acquired pneumonia, develops in people who are not, or have not recently been, in the hospital or other health care facility.  CAUSES Pneumonia may be caused by bacteria, viruses, or funguses. Community-acquired pneumonia is often caused by Streptococcus pneumonia bacteria. These bacteria are often passed from one person to another by breathing in droplets from the cough or sneeze of an infected person. RISK FACTORS The condition is more likely to develop in:  People who havechronic diseases, such as chronic obstructive pulmonary disease (COPD), asthma, congestive heart failure, cystic fibrosis, diabetes, or kidney disease.  People who haveearly-stage or late-stage HIV.  People who havesickle cell disease.  People who havehad their spleen removed (splenectomy).  People who havepoor Human resources officer.  People who havemedical conditions that increase the risk of breathing in (aspirating) secretions their own mouth and nose.   People who havea weakened immune system (immunocompromised).  People who smoke.  People whotravel to areas where pneumonia-causing germs commonly exist.  People whoare around animal habitats or animals that have pneumonia-causing germs, including birds, bats, rabbits, cats, and farm animals. SYMPTOMS Symptoms of this condition include:  Adry cough.  A wet (productive) cough.  Fever.  Sweating.  Chest pain, especially when breathing deeply or coughing.  Rapid breathing or difficulty breathing.  Shortness of breath.  Shaking chills.  Fatigue.  Muscle aches. DIAGNOSIS Your health care provider will take a medical history and perform a physical exam. You may also have other tests, including:  Imaging studies of your chest, including  X-rays.  Tests to check your blood oxygen level and other blood gases.  Other tests on blood, mucus (sputum), fluid around your lungs (pleural fluid), and urine. If your pneumonia is severe, other tests may be done to identify the specific cause of your illness. TREATMENT The type of treatment that you receive depends on many factors, such as the cause of your pneumonia, the medicines you take, and other medical conditions that you have. For most adults, treatment and recovery from pneumonia may occur at home. In some cases, treatment must happen in a hospital. Treatment may include:  Antibiotic medicines, if the pneumonia was caused by bacteria.  Antiviral medicines, if the pneumonia was caused by a virus.  Medicines that are given by mouth or through an IV tube.  Oxygen.  Respiratory therapy. Although rare, treating severe pneumonia may include:  Mechanical ventilation. This is done if you are not breathing well on your own and you cannot maintain a safe blood oxygen level.  Thoracentesis. This procedureremoves fluid around one lung or both lungs to help you breathe better. HOME CARE INSTRUCTIONS  Take over-the-counter and prescription medicines only as told by your health care provider.  Only takecough medicine if you are losing sleep. Understand that cough medicine can prevent your body's natural ability to remove mucus from your lungs.  If you were prescribed an antibiotic medicine, take it as told by your health care provider. Do not stop taking the antibiotic even if you start to feel better.  Sleep in a semi-upright position at night. Try sleeping in a reclining chair, or place a few pillows under your head.  Do not use tobacco products, including cigarettes, chewing tobacco, and e-cigarettes. If you need help quitting, ask your health care provider.  Drink enough water to keep your urine  clear or pale yellow. This will help to thin out mucus secretions in your  lungs. PREVENTION There are ways that you can decrease your risk of developing community-acquired pneumonia. Consider getting a pneumococcal vaccine if:  You are older than 80 years of age.  You are older than 80 years of age and are undergoing cancer treatment, have chronic lung disease, or have other medical conditions that affect your immune system. Ask your health care provider if this applies to you. There are different types and schedules of pneumococcal vaccines. Ask your health care provider which vaccination option is best for you. You may also prevent community-acquired pneumonia if you take these actions:  Get an influenza vaccine every year. Ask your health care provider which type of influenza vaccine is best for you.  Go to the dentist on a regular basis.  Wash your hands often. Use hand sanitizer if soap and water are not available. SEEK MEDICAL CARE IF:  You have a fever.  You are losing sleep because you cannot control your cough with cough medicine. SEEK IMMEDIATE MEDICAL CARE IF:  You have worsening shortness of breath.  You have increased chest pain.  Your sickness becomes worse, especially if you are an older adult or have a weakened immune system.  You cough up blood.   This information is not intended to replace advice given to you by your health care provider. Make sure you discuss any questions you have with your health care provider.   Document Released: 04/04/2005 Document Revised: 12/24/2014 Document Reviewed: 07/30/2014 Elsevier Interactive Patient Education 2016 [employer] Dementia Dementia is a general term for problems with brain function. A person with dementia has memory loss and a hard time with at least one other brain function such as thinking, speaking, or problem solving. Dementia can affect social functioning, how you do your job, your mood, or your personality. The changes may be hidden for a long time. The earliest forms of this disease  are usually not detected by family or friends. Dementia can be:  Irreversible.  Potentially reversible.  Partially reversible.  Progressive. This means it can get worse over time. CAUSES  Irreversible dementia causes may include:  Degeneration of brain cells (Alzheimer disease or Lewy body dementia).  Multiple small strokes (vascular dementia).  Infection (chronic meningitis or Creutzfeldt-Jakob disease).  Frontotemporal dementia. This affects younger people, age 16 to 75, compared to those who have Alzheimer disease.  Dementia associated with other disorders like Parkinson disease, Huntington disease, or HIV-associated dementia. Potentially or partially reversible dementia causes may include:  Medicines.  Metabolic causes such as excessive alcohol intake, vitamin B12 deficiency, or thyroid disease.  Masses or pressure in the brain such as a tumor, blood clot, or hydrocephalus. SIGNS AND SYMPTOMS  Symptoms are often hard to detect. Family members or coworkers may not notice them early in the disease process. Different people with dementia may have different symptoms. Symptoms can include:  A hard time with memory, especially recent memory. Long-term memory may not be impaired.  Asking the same question multiple times or forgetting something someone just said.  A hard time speaking your thoughts or finding certain words.  A hard time solving problems or performing familiar tasks (such as how to use a telephone).  Sudden changes in mood.  Changes in personality, especially increasing moodiness or mistrust.  Depression.  A hard time understanding complex ideas that were never a problem in the past. DIAGNOSIS  There are no specific tests for  dementia.   Your health care provider may recommend a thorough evaluation. This is because some forms of dementia can be reversible. The evaluation will likely include a physical exam and getting a detailed history from you and a  family member. The history often gives the best clues and suggestions for a diagnosis.  Memory testing may be done. A detailed brain function evaluation called neuropsychologic testing may be helpful.  Lab tests and brain imaging (such as a CT scan or MRI scan) are sometimes important.  Sometimes observation and re-evaluation over time is very helpful. TREATMENT  Treatment depends on the cause.   If the problem is a vitamin deficiency, it may be helped or cured with supplements.  For dementias such as Alzheimer disease, medicines are available to stabilize or slow the course of the disease. There are no cures for this type of dementia.  Your health care provider can help direct you to groups, organizations, and other health care providers to help with decisions in the care of you or your loved one. HOME CARE INSTRUCTIONS The care of individuals with dementia is varied and dependent upon the progression of the dementia. The following suggestions are intended for the person living with, or caring for, the person with dementia.  Create a safe environment.  Remove the locks on bathroom doors to prevent the person from accidentally locking himself or herself in.  Use childproof latches on kitchen cabinets and any place where cleaning supplies, chemicals, or alcohol are kept.  Use childproof covers in unused electrical outlets.  Install childproof devices to keep doors and windows secured.  Remove stove knobs or install safety knobs and an automatic shut-off on the stove.  Lower the temperature on water heaters.  Label medicines and keep them locked up.  Secure knives, lighters, matches, power tools, and guns, and keep these items out of reach.  Keep the house free from clutter. Remove rugs or anything that might contribute to a fall.  Remove objects that might break and hurt the person.  Make sure lighting is good, both inside and outside.  Install grab rails as needed.  Use a  monitoring device to alert you to falls or other needs for help.  Reduce confusion.  Keep familiar objects and people around.  Use night lights or dim lights at night.  Label items or areas.  Use reminders, notes, or directions for daily activities or tasks.  Keep a simple, consistent routine for waking, meals, bathing, dressing, and bedtime.  Create a calm, quiet environment.  Place large clocks and calendars prominently.  Display emergency numbers and home address near all telephones.  Use cues to establish different times of the day. An example is to open curtains to let the natural light in during the day.   Use effective communication.  Choose simple words and short sentences.  Use a gentle, calm tone of voice.  Be careful not to interrupt.  If the person is struggling to find a word or communicate a thought, try to provide the word or thought.  Ask one question at a time. Allow the person ample time to answer questions. Repeat the question again if the person does not respond.  Reduce nighttime restlessness.  Provide a comfortable bed.  Have a consistent nighttime routine.  Ensure a regular walking or physical activity schedule. Involve the person in daily activities as much as possible.  Limit napping during the day.  Limit caffeine.  Attend social events that stimulate rather than overwhelm  the senses.  Encourage good nutrition and hydration.  Reduce distractions during meal times and snacks.  Avoid foods that are too hot or too cold.  Monitor chewing and swallowing ability.  Continue with routine vision, hearing, dental, and medical screenings.  Give medicines only as directed by the health care provider.  Monitor driving abilities. Do not allow the person to drive when safe driving is no longer possible.  Register with an identification program which could provide location assistance in the event of a missing person situation. SEEK MEDICAL  CARE IF:   New behavioral problems start such as moodiness, aggressiveness, or seeing things that are not there (hallucinations).  Any new problem with brain function happens. This includes problems with balance, speech, or falling a lot.  Problems with swallowing develop.  Any symptoms of other illness happen. Small changes or worsening in any aspect of brain function can be a sign that the illness is getting worse. It can also be a sign of another medical illness such as infection. Seeing a health care provider right away is important. SEEK IMMEDIATE MEDICAL CARE IF:   A fever develops.  New or worsened confusion develops.  New or worsened sleepiness develops.  Staying awake becomes hard to do.   This information is not intended to replace advice given to you by your health care provider. Make sure you discuss any questions you have with your health care provider.   Document Released: 09/28/2000 Document Revised: 04/25/2014 Document Reviewed: 08/30/2010 Elsevier Interactive Patient Education [employer].

## 2015-09-09 NOTE — Evaluation (Signed)
Physical Therapy Evaluation Patient Details Name: Timothy Ali MRN: [id number] DOB: May 26, 1929 Today's Date: 09/09/2015   History of Present Illness  Patient is an 82 y/o male that presents with increase shortness of breath over the past few weeks, complaints of weakness. He has stage 4 lung cancer, stable mets eroding L T3 vertebrae. Noted to have a pyschiatric history and reports of being found on the floor during this visit as well.   Clinical Impression  Patient was recently seen by another PT at this facility and discharged in house with no acute mobility deficits. It appears he is at roughly his baseline level today, independent with bed mobility and transfers, and ambulation speed is excellent. He was noted to desat on 3L of O2, though this may be artifact on portable pulse-ox. He apparently has a psychiatric history and there has been discussion/concern that patient is not cognitively capable of taking care of himself. Unfortunately he is at roughly his physical baseline which is sufficient to return to home mobility. He was noted to have poor trains of thought, poor memory/historian which is likely the source of concern for returning to his previous living establishment. Acute PT services can monitor his cardiopulmonary response to activity, however no further PT services are deemed necessary after this admission as this is his baseline mobility level.     Follow Up Recommendations No PT follow up    Equipment Recommendations       Recommendations for Other Services       Precautions / Restrictions Precautions Precautions: Fall Restrictions Weight Bearing Restrictions: No      Mobility  Bed Mobility Overal bed mobility: Independent             General bed mobility comments: No deficits identified.   Transfers Overall transfer level: Independent Equipment used: None             General transfer comment: No loss of balance, no deficits identified.    Ambulation/Gait Ambulation/Gait assistance: Supervision Ambulation Distance (Feet): 150 Feet   Gait Pattern/deviations: WFL(Within Functional Limits)   Gait velocity interpretation: at or above normal speed for age/gender General Gait Details: Patient does have some shortness of breath, occasional scissoring especially while turning head during ambulation. Excellent gait speed noted.   Stairs            Wheelchair Mobility    Modified Rankin (Stroke Patients Only)       Balance Overall balance assessment: Needs assistance Sitting-balance support: No upper extremity supported Sitting balance-Leahy Scale: Good     Standing balance support: No upper extremity supported Standing balance-Leahy Scale: Fair                               Pertinent Vitals/Pain Pain Assessment:  (Reports some R shoulder pain, appears to be chronic. Able to flex his arm throughout entire ROM.)    Home Living Family/patient expects to be discharged to:: Unsure Living Arrangements: Alone   Type of Home: House Home Access: Level entry     Home Layout: One level Home Equipment: Walker - 2 wheels;Cane - single point      Prior Function Level of Independence: Independent         Comments: Pt is a poor historian, but has been driving in the last year.      Hand Dominance        Extremity/Trunk Assessment   Upper Extremity Assessment: Overall WFL for  tasks assessed           Lower Extremity Assessment: Overall WFL for tasks assessed         Communication   Communication: No difficulties  Cognition Arousal/Alertness: Awake/alert Behavior During Therapy: WFL for tasks assessed/performed Overall Cognitive Status: History of cognitive impairments - at baseline                      General Comments      Exercises        Assessment/Plan    PT Assessment Patient needs continued PT services  PT Diagnosis Generalized weakness   PT Problem  List Cardiopulmonary status limiting activity  PT Treatment Interventions Gait training;Therapeutic activities;Therapeutic exercise;Balance training   PT Goals (Current goals can be found in the Care Plan section) Acute Rehab PT Goals Patient Stated Goal: Patient is adamant he would like to leave.  PT Goal Formulation: With patient Time For Goal Achievement: 09/23/15 Potential to Achieve Goals: Good    Frequency Min 2X/week   Barriers to discharge Decreased caregiver support Patient lives alone and it is unclear if he is able to take care of himself per discussion with RN and CSW.     Co-evaluation               End of Session Equipment Utilized During Treatment: Gait belt;Oxygen Activity Tolerance: Patient tolerated treatment well Patient left: in bed;with nursing/sitter in room Nurse Communication: Mobility status    Functional Assessment Tool Used: Clinical judgement  Functional Limitation: Mobility: Walking and moving around Mobility: Walking and Moving Around Current Status (K3546): At least 1 percent but less than 20 percent impaired, limited or restricted Mobility: Walking and Moving Around Goal Status [phone]): At least 1 percent but less than 20 percent impaired, limited or restricted    Time: 7517-0017 PT Time Calculation (min) (ACUTE ONLY): 13 min   Charges:   PT Evaluation $PT Eval Low Complexity: 1 Procedure     PT G Codes:   PT G-Codes **NOT FOR INPATIENT CLASS** Functional Assessment Tool Used: Clinical judgement  Functional Limitation: Mobility: Walking and moving around Mobility: Walking and Moving Around Current Status (C9449): At least 1 percent but less than 20 percent impaired, limited or restricted Mobility: Walking and Moving Around Goal Status [phone]): At least 1 percent but less than 20 percent impaired, limited or restricted   Kerman Passey, PT, DPT    09/09/2015, 4:58 PM

## 2015-09-09 NOTE — ED Notes (Signed)
Pt sleeping on back, mouth open, nasal cannula on 3L. Blanket covering pt, Side rails up on both sides towards head. Side rails toward feet are down. Sitter at bedside.

## 2015-09-09 NOTE — ED Notes (Signed)
Pt left with sister, Vaughan Basta.

## 2015-09-09 NOTE — Clinical Social Work Note (Signed)
PT has notified patient's nurse that patient does not need STR. CSW has made a DSS APS report regarding patient's return home. Patient's nurse to contact daughter and make arrangements for discharge. Shela Leff MSW,LCSW [phone]

## 2015-09-14 LAB — CULTURE, BLOOD (ROUTINE X 2)
CULTURE: NO GROWTH
Culture: NO GROWTH

## 2015-09-17 ENCOUNTER — Inpatient Hospital Stay: Payer: Medicare HMO | Attending: Oncology

## 2015-09-17 ENCOUNTER — Inpatient Hospital Stay (HOSPITAL_BASED_OUTPATIENT_CLINIC_OR_DEPARTMENT_OTHER): Payer: Medicare HMO | Admitting: Oncology

## 2015-09-17 VITALS — BP 153/77 | HR 106 | Temp 96.6°F | Resp 18 | Wt 130.2 lb

## 2015-09-17 DIAGNOSIS — Z87891 Personal history of nicotine dependence: Secondary | ICD-10-CM | POA: Diagnosis not present

## 2015-09-17 DIAGNOSIS — M25511 Pain in right shoulder: Secondary | ICD-10-CM | POA: Diagnosis not present

## 2015-09-17 DIAGNOSIS — J440 Chronic obstructive pulmonary disease with acute lower respiratory infection: Secondary | ICD-10-CM | POA: Insufficient documentation

## 2015-09-17 DIAGNOSIS — C3492 Malignant neoplasm of unspecified part of left bronchus or lung: Secondary | ICD-10-CM

## 2015-09-17 DIAGNOSIS — Z9981 Dependence on supplemental oxygen: Secondary | ICD-10-CM | POA: Diagnosis not present

## 2015-09-17 DIAGNOSIS — C3412 Malignant neoplasm of upper lobe, left bronchus or lung: Secondary | ICD-10-CM | POA: Insufficient documentation

## 2015-09-17 DIAGNOSIS — J45909 Unspecified asthma, uncomplicated: Secondary | ICD-10-CM | POA: Insufficient documentation

## 2015-09-17 DIAGNOSIS — Z923 Personal history of irradiation: Secondary | ICD-10-CM | POA: Insufficient documentation

## 2015-09-17 DIAGNOSIS — Z9221 Personal history of antineoplastic chemotherapy: Secondary | ICD-10-CM

## 2015-09-17 DIAGNOSIS — Z79899 Other long term (current) drug therapy: Secondary | ICD-10-CM

## 2015-09-17 LAB — CBC WITH DIFFERENTIAL/PLATELET
BASOS ABS: 0 10*3/uL (ref 0–0.1)
BASOS PCT: 1 %
EOS ABS: 0.2 10*3/uL (ref 0–0.7)
Eosinophils Relative: 4 %
HCT: 32.8 % — ABNORMAL LOW (ref 40.0–52.0)
HEMOGLOBIN: 11 g/dL — AB (ref 13.0–18.0)
Lymphocytes Relative: 15 %
Lymphs Abs: 0.8 10*3/uL — ABNORMAL LOW (ref 1.0–3.6)
MCH: 29.2 pg (ref 26.0–34.0)
MCHC: 33.5 g/dL (ref 32.0–36.0)
MCV: 87.3 fL (ref 80.0–100.0)
MONO ABS: 0.8 10*3/uL (ref 0.2–1.0)
MONOS PCT: 15 %
NEUTROS PCT: 65 %
Neutro Abs: 3.6 10*3/uL (ref 1.4–6.5)
PLATELETS: 195 10*3/uL (ref 150–440)
RBC: 3.76 MIL/uL — ABNORMAL LOW (ref 4.40–5.90)
RDW: 15.8 % — AB (ref 11.5–14.5)
WBC: 5.5 10*3/uL (ref 3.8–10.6)

## 2015-09-17 LAB — COMPREHENSIVE METABOLIC PANEL
ALBUMIN: 2.9 g/dL — AB (ref 3.5–5.0)
ALT: 10 U/L — ABNORMAL LOW (ref 17–63)
ANION GAP: 6 (ref 5–15)
AST: 13 U/L — ABNORMAL LOW (ref 15–41)
Alkaline Phosphatase: 78 U/L (ref 38–126)
BUN: 13 mg/dL (ref 6–20)
CHLORIDE: 99 mmol/L — AB (ref 101–111)
CO2: 32 mmol/L (ref 22–32)
Calcium: 8.2 mg/dL — ABNORMAL LOW (ref 8.9–10.3)
Creatinine, Ser: 0.78 mg/dL (ref 0.61–1.24)
GFR calc Af Amer: >60 mL/min (ref >60)
GFR calc non Af Amer: >60 mL/min (ref >60)
GLUCOSE: 110 mg/dL — AB (ref 65–99)
POTASSIUM: 3.3 mmol/L — AB (ref 3.5–5.1)
SODIUM: 137 mmol/L (ref 135–145)
TOTAL PROTEIN: 7.1 g/dL (ref 6.5–8.1)
Total Bilirubin: 0.3 mg/dL (ref 0.3–1.2)

## 2015-09-17 MED ORDER — PREDNISONE 10 MG PO TABS: 10.0000 mg | ORAL_TABLET | Freq: Every day | ORAL | Status: DC

## 2015-09-17 NOTE — Progress Notes (Signed)
Patient's O2 after walking from waiting area to exam room - 71%.  Placed patient on 3L O2 and increased to 94%.  Patient has no complaints today other than right shoulder pain that he states is arthritis.

## 2015-09-19 ENCOUNTER — Encounter: Payer: Self-pay | Admitting: Oncology

## 2015-09-19 NOTE — Progress Notes (Signed)
[place] @ [facility] [phone]  Fax:([phone]) [place]: [birth year]  MR#: [id number]  VUD#:[id number]  Patient Care Team: Albina Billet, MD as PCP - General (Internal Medicine)  CHIEF COMPLAINT:  Chief Complaint  Patient presents with  . Lung Cancer    Oncology History   1.  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( Further staging workup with PET scan pending (diagnosis based on  CT scan ) July, 2016 2. SB RT for a T1 lesion of the left lung superior segment left upper lobe Biopsy was consistent with non-small cell carcinoma of lung in February of 2015 (squamous cell carcinoma) 3.  Started radiation and chemotherapy for left upper lobe recurrent disease versus second primary from November 10, 2014   4.  Patient has finished total 6 weekly cycle of carboplatinum and Taxol on December 15, 2014 Will   Be finished   With radiation therapy by December 21, 2014 5.  Persistent disease and persistent pain.  Patient has been started on TECENTRIQ from March 05, 2015 6.TECENTRIQ  Has been discontinued in April of 2017 because of progressing disease based on PET scan       INTERVAL HISTORY: 82 year old gentleman was started on chemotherapy with platinum and Taxol starting from July of 2016.  Patient is here for ongoing evaluation and treatment consideration.  Appetite is improved.  Chest pain is improved.  No nausea.  No vomiting.    For ongoing evaluation and treatment consideration. Appetite has improved.  Chest pain is improved.  His shortness of breath is getting better.  Nausea no vomiting no diarrhea.  The patient was slightly hypoxic.  Patient is using oxygen at home but did not bring his oxygen at present time.  No diarrhea.  No rash.  Patient had PET scan for reevaluation which has been reviewed independently. Patient is here after recent hospitalization.  He was admitted with a history of pneumonia.  He is getting better.  Went back  home.  According to him he is breathing better pain is under control. Here  for further evaluation and treatment consideration      Patient's O2 after walking from waiting area to exam room - 71%. Placed patient on 3L O2 and increased to 94%. Patient has no complaints today other than right shoulder pain that he states     PERFORMANCE STATUS (ECOG):01 HEENT:  No visual changes, runny nose, sore throat, mouth sores or tenderness. Lungs: Dry hacking cough.  No hemoptysis. Cardiac:  No chest pain, palpitations, orthopnea, or PND. GI:  No nausea, vomiting, diarrhea, constipation, melena or hematochezia. GU:  No urgency, frequency, dysuria, or hematuria. Musculoskeletal:  Left shoulder pain Extremities:  No pain or swelling. Skin: Rash in the left upper extremity left lower extremity Neuro:  No headache, numbness or weakness, balance or coordination issues. Endocrine:  No diabetes, thyroid issues, hot flashes or night sweats. Psych:  No mood changes, depression or anxiety. Pain:  No focal pain. Review of systems:  All other systems reviewed and found to be negative. As per HPI. Otherwise, a complete review of systems is negatve.  PAST MEDICAL HISTORY: Past Medical History  Diagnosis Date  . Major depressive disorder, recurrent, severe with psychotic features ([place])   . Persistent disorder of initiating or maintaining sleep   . Generalized anxiety disorder   . Major depressive disorder, recurrent, severe with psychotic features ([place])   . Persistent disorder of initiating or  maintaining sleep   . Asthma   . Cancer ([place])     Left lung  . COPD (chronic obstructive pulmonary disease) ([place])     PAST SURGICAL HISTORY: Past Surgical History  Procedure Laterality Date  . Hemorroidectomy    . Lung surgery      FAMILY HISTORY Family History  Problem Relation Age of Onset  . COPD Mother   . Lung cancer Father   . Kidney disease Neg Hx   . Prostate cancer Neg Hx     ADVANCED  DIRECTIVES:  Patient does have advance healthcare directive, Patient   does not desire to make any changes HEALTH MAINTENANCE: Social History  Substance Use Topics  . Smoking status: Former Research scientist (life sciences)  . Smokeless tobacco: Never Used     Comment: quit 18 years ago  . Alcohol Use: No      Allergies  Allergen Reactions  . Penicillins Other (See Comments)    Reaction:  Unknown     Current Outpatient Prescriptions  Medication Sig Dispense Refill  . albuterol (PROVENTIL HFA;VENTOLIN HFA) 108 (90 BASE) MCG/ACT inhaler Inhale 2 puffs into the lungs every 4 (four) hours as needed for wheezing or shortness of breath.     Marland Kitchen atorvastatin (LIPITOR) 40 MG tablet Take 40 mg by mouth at bedtime.     Marland Kitchen BREO ELLIPTA 100-25 MCG/INH AEPB Inhale 1 puff into the lungs daily.     Marland Kitchen esomeprazole (NEXIUM) 40 MG capsule Take 40 mg by mouth daily.     Marland Kitchen guaiFENesin (MUCINEX) 600 MG 12 hr tablet Take 1 tablet (600 mg total) by mouth 2 (two) times daily. 20 tablet 0  . ipratropium-albuterol (DUONEB) 0.5-2.5 (3) MG/3ML SOLN Take 3 mLs by nebulization every 6 (six) hours as needed. 360 mL 1  . levofloxacin (LEVAQUIN) 500 MG tablet Take 1 tablet by mouth daily. Reported on 08/30/2015    . LORazepam (ATIVAN) 0.5 MG tablet Take 0.5 mg by mouth every 8 (eight) hours. Pt takes with a 86m tablet.    .Marland KitchenLORazepam (ATIVAN) 1 MG tablet Take 1 mg by mouth every 8 (eight) hours. Pt takes with a 0.566mtablet.    . Marland KitchenORazepam (ATIVAN) 1 MG tablet Take 1 tablet (1 mg total) by mouth every 8 (eight) hours as needed for anxiety. 90 tablet 0  . potassium chloride SA (K-DUR,KLOR-CON) 20 MEQ tablet Take 1 tablet (20 mEq total) by mouth daily. 30 tablet 3  . QUEtiapine (SEROQUEL XR) 300 MG 24 hr tablet Take 1 tablet (300 mg total) by mouth at bedtime. 90 tablet 1  . senna-docusate (SENOKOT-S) 8.6-50 MG tablet Take 1 tablet by mouth 2 (two) times daily. 30 tablet 0  . tamsulosin (FLOMAX) 0.4 MG CAPS capsule Take 0.4 mg by mouth daily after  breakfast.     . tiotropium (SPIRIVA) 18 MCG inhalation capsule Place 18 mcg into inhaler and inhale daily.     . Marland Kitchenenlafaxine XR (EFFEXOR-XR) 150 MG 24 hr capsule Take 1 capsule (150 mg total) by mouth daily. 90 capsule 1  . zolpidem (AMBIEN) 10 MG tablet Take 1 tablet by mouth at bedtime.    . predniSONE (DELTASONE) 10 MG tablet Take 1 tablet (10 mg total) by mouth daily with breakfast. 30 tablet 2   No current facility-administered medications for this visit.    OBJECTIVE:  Filed Vitals:   09/17/15 1159  BP: 153/77  Pulse: 106  Temp: 96.6 F (35.9 C)  Resp: 18     Body mass  index is 17.66 kg/(m^2).    ECOG FS:1 - Symptomatic but completely ambulatory  PHYSICAL EXAM: GENERAL:  Well developed, well nourished, sitting comfortably in the exam room in no acute distress. MENTAL STATUS:  Alert and oriented to person, place and time. HEAD: Alopecia  Normocephalic, atraumatic, face symmetric, no Cushingoid features. EYES:  Pupils equal round and reactive to light and accomodation.  No conjunctivitis or scleral icterus. ENT:  Oropharynx clear without lesion.  Tongue normal. Mucous membranes moist.  RESPIRATORY:  Clear to auscultation without rales, wheezes or rhonchi. CARDIOVASCULAR:  Regular rate and rhythm without murmur, rub or gallop. BREAST:  Right breast without masses, skin changes or nipple discharge.  Left breast without masses, skin changes or nipple discharge. ABDOMEN:  Soft, non-tender, with active bowel sounds, and no hepatosplenomegaly.  No masses. BACK:  No CVA tenderness.  No tenderness on percussion of the back or rib cage. SKIN: Maculopapular rash. EXTREMITIES: No edema, no skin discoloration or tenderness.  No palpable cords. LYMPH NODES: No palpable cervical, supraclavicular, axillary or inguinal adenopathy  NEUROLOGICAL: Unremarkable. PSYCH:  Appropriate.   LAB RESULTS:  Appointment on 09/17/2015  Component Date Value Ref Range Status  . WBC 09/17/2015 5.5  3.8  - 10.6 K/uL Final  . RBC 09/17/2015 3.76* 4.40 - 5.90 MIL/uL Final  . Hemoglobin 09/17/2015 11.0* 13.0 - 18.0 g/dL Final  . HCT 09/17/2015 32.8* 40.0 - 52.0 % Final  . MCV 09/17/2015 87.3  80.0 - 100.0 fL Final  . MCH 09/17/2015 29.2  26.0 - 34.0 pg Final  . MCHC 09/17/2015 33.5  32.0 - 36.0 g/dL Final  . RDW 09/17/2015 15.8* 11.5 - 14.5 % Final  . Platelets 09/17/2015 195  150 - 440 K/uL Final  . Neutrophils Relative % 09/17/2015 65   Final  . Neutro Abs 09/17/2015 3.6  1.4 - 6.5 K/uL Final  . Lymphocytes Relative 09/17/2015 15   Final  . Lymphs Abs 09/17/2015 0.8* 1.0 - 3.6 K/uL Final  . Monocytes Relative 09/17/2015 15   Final  . Monocytes Absolute 09/17/2015 0.8  0.2 - 1.0 K/uL Final  . Eosinophils Relative 09/17/2015 4   Final  . Eosinophils Absolute 09/17/2015 0.2  0 - 0.7 K/uL Final  . Basophils Relative 09/17/2015 1   Final  . Basophils Absolute 09/17/2015 0.0  0 - 0.1 K/uL Final  . Sodium 09/17/2015 137  135 - 145 mmol/L Final  . Potassium 09/17/2015 3.3* 3.5 - 5.1 mmol/L Final  . Chloride 09/17/2015 99* 101 - 111 mmol/L Final  . CO2 09/17/2015 32  22 - 32 mmol/L Final  . Glucose, Bld 09/17/2015 110* 65 - 99 mg/dL Final  . BUN 09/17/2015 13  6 - 20 mg/dL Final  . Creatinine, Ser 09/17/2015 0.78  0.61 - 1.24 mg/dL Final  . Calcium 09/17/2015 8.2* 8.9 - 10.3 mg/dL Final  . Total Protein 09/17/2015 7.1  6.5 - 8.1 g/dL Final  . Albumin 09/17/2015 2.9* 3.5 - 5.0 g/dL Final  . AST 09/17/2015 13* 15 - 41 U/L Final  . ALT 09/17/2015 10* 17 - 63 U/L Final  . Alkaline Phosphatase 09/17/2015 78  38 - 126 U/L Final  . Total Bilirubin 09/17/2015 0.3  0.3 - 1.2 mg/dL Final  . GFR calc non Af Amer 09/17/2015 >60  >60 mL/min Final  . GFR calc Af Amer 09/17/2015 >60  >60 mL/min Final   Comment: (NOTE) The eGFR has been calculated using the CKD EPI equation. This calculation has not been validated  in all clinical situations. eGFR's persistently <60 mL/min signify possible Chronic  Kidney Disease.   . 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 PET scan has been reviewed independently and reviewed with the patient.   Progressive disease . Recent Hospital ligation with increasing pain and pneumonia.  Patient has improved now.  Pain is better controlled. Patient has refused to go to any assisted living facility Family is concerned about him staying by himself I also had discussion with Dr. Benita Stabile  his primary care physician. We will also consider repeating CT scan to see if pneumonia has cleared up to locate the extent of the disease.  Consider possibility of reirradiation knee pain continues to be a problem.  Possibility of adding chemotherapy can be considered (patient had stereotactic radiation in February of 2015 and also had left upper lobe radiation in 2016) Possibility of chemotherapy with carboplatinum and gemcitabine along with monoclonal antibody can be considered. We discussed the social issue with patient Repeat CT scan and then follow-up  With Dr. be     Staging form: Lung, AJCC 7th Edition    Clinical: Stage IIIA (T4, N0, M0) - Signed by Forest Gleason, MD on 10/29/2014   Forest Gleason, MD   09/19/2015 8:09 AM

## 2015-09-21 ENCOUNTER — Encounter: Payer: Self-pay | Admitting: Psychiatry

## 2015-09-21 ENCOUNTER — Ambulatory Visit (INDEPENDENT_AMBULATORY_CARE_PROVIDER_SITE_OTHER): Payer: Medicare HMO | Admitting: Psychiatry

## 2015-09-21 VITALS — BP 108/62 | HR 86

## 2015-09-21 DIAGNOSIS — F333 Major depressive disorder, recurrent, severe with psychotic symptoms: Secondary | ICD-10-CM | POA: Diagnosis not present

## 2015-09-21 DIAGNOSIS — F411 Generalized anxiety disorder: Secondary | ICD-10-CM | POA: Diagnosis not present

## 2015-09-21 MED ORDER — VENLAFAXINE HCL ER 150 MG PO CP24: 150.0000 mg | ORAL_CAPSULE | Freq: Every day | ORAL | Status: DC

## 2015-09-21 MED ORDER — QUETIAPINE FUMARATE ER 300 MG PO TB24: 300.0000 mg | ORAL_TABLET | Freq: Every day | ORAL | Status: DC

## 2015-09-21 NOTE — Progress Notes (Signed)
Patient ID: Timothy Ali, male   DOB: [birth year], 82 y.o.   MRN: [id number] [facility] MD/PA/NP OP Progress Note  09/21/2015 10:31 AM Timothy Ali  MRN:  [id number]  Subjective:  Patient presents for follow-up of his generalized anxiety disorder, insomnia and major depressive disorder. Patient reports that he has not been doing very well since his being treated for his cancer. States that he has not been eating well because of his cancer and has lost weight and hopes to gain his weight back. Patient states he is doing okay in terms of his mood. His been compliant with the Seroquel and the Effexor. States that.he would like to continue his lorazepam. Patient was to have come back in 1 month after his last appointment on March 22. However patient has not the come back until now and states that he continues to take the lorazepam 3 times daily. He was recently given a prescription of 90 tablets of lorazepam 1 mg each by his primary care physician Dr. Jimmye Norman. Discussed with patient that since he has been educated about the side effects on the medication and the long-term mom risks also given his age and his clinician will not be prescribing lorazepam  Anymore. Patient stated that he will get the lorazepam filled by his primary care physician.  Chief Complaint    Follow-up; Medication Refill     Visit Diagnosis:     ICD-9-CM ICD-10-CM   1. GAD (generalized anxiety disorder) 300.02 F41.1   2. Severe recurrent major depressive disorder with psychotic features ([place]) 296.34 F33.3     Past Medical History:  Past Medical History  Diagnosis Date  . Major depressive disorder, recurrent, severe with psychotic features ([place])   . Persistent disorder of initiating or maintaining sleep   . Generalized anxiety disorder   . Major depressive disorder, recurrent, severe with psychotic features ([place])   . Persistent disorder of initiating or maintaining sleep   . Asthma   . Cancer ([place])     Left lung  . COPD (chronic  obstructive pulmonary disease) [facility])     Past Surgical History  Procedure Laterality Date  . Hemorroidectomy    . Lung surgery     Family History:  Family History  Problem Relation Age of Onset  . COPD Mother   . Lung cancer Father   . Kidney disease Neg Hx   . Prostate cancer Neg Hx    Social History:  Social History   Social History  . Marital Status: Widowed    Spouse Name: N/A  . Number of Children: N/A  . Years of Education: N/A   Social History Main Topics  . Smoking status: Former Research scientist (life sciences)  . Smokeless tobacco: Never Used     Comment: quit 18 years ago  . Alcohol Use: No  . Drug Use: No  . Sexual Activity: No   Other Topics Concern  . None   Social History Narrative   Additional History:   Assessment:   Musculoskeletal: Strength & Muscle Tone: within normal limits Gait & Station: normal Patient leans: N/A  Psychiatric Specialty Exam: Anxiety Symptoms include nervous/anxious behavior. Patient reports no insomnia (doing well with Ambien) or suicidal ideas.    Depression        Associated symptoms include does not have insomnia (doing well with Ambien) and no suicidal ideas.  Past medical history includes anxiety.     Review of Systems  Psychiatric/Behavioral: Negative for depression, suicidal ideas, hallucinations, memory loss and substance abuse.  The patient is nervous/anxious. The patient does not have insomnia (doing well with Ambien).   All other systems reviewed and are negative.   Blood pressure 108/62, pulse 86, SpO2 75 %.There is no weight on file to calculate BMI.  General Appearance: Neat and Well Groomed  Eye Contact:  Good  Speech:  Clear and Coherent and Normal Rate  Volume:  Normal  Mood:  good  Affect:  Congruent  Thought Process:  Linear and Logical  Orientation:  Full (Time, Place, and Person)  Thought Content:  Normal   Suicidal Thoughts:  No  Homicidal Thoughts:  No  Memory:  Immediate;   Good Recent;   Good Remote;   Good   Judgement:  Good  Insight:  Fair  Psychomotor Activity:  Negative  Concentration:  Good  Recall:  Good  Fund of Knowledge: Good  Language: Good  Akathisia:  Negative  Handed:  Right unknown  AIMS (if indicated):  Not done  Assets:  Desire for Improvement  ADL's:  Intact  Cognition: WNL  Sleep:  5 hour per night   Is the patient at risk to self?  No. Has the patient been a risk to self in the past 6 months?  No. Has the patient been a risk to self within the distant past?  No. Is the patient a risk to others?  No. Has the patient been a risk to others in the past 6 months?  No. Has the patient been a risk to others within the distant past?  No.  Current Medications: Current Outpatient Prescriptions  Medication Sig Dispense Refill  . albuterol (PROVENTIL HFA;VENTOLIN HFA) 108 (90 BASE) MCG/ACT inhaler Inhale 2 puffs into the lungs every 4 (four) hours as needed for wheezing or shortness of breath.     Marland Kitchen atorvastatin (LIPITOR) 40 MG tablet Take 40 mg by mouth at bedtime.     Marland Kitchen BREO ELLIPTA 100-25 MCG/INH AEPB Inhale 1 puff into the lungs daily.     Marland Kitchen esomeprazole (NEXIUM) 40 MG capsule Take 40 mg by mouth daily.     Marland Kitchen guaiFENesin (MUCINEX) 600 MG 12 hr tablet Take 1 tablet (600 mg total) by mouth 2 (two) times daily. 20 tablet 0  . ipratropium-albuterol (DUONEB) 0.5-2.5 (3) MG/3ML SOLN Take 3 mLs by nebulization every 6 (six) hours as needed. 360 mL 1  . levofloxacin (LEVAQUIN) 500 MG tablet Take 1 tablet by mouth daily. Reported on 08/30/2015    . LORazepam (ATIVAN) 0.5 MG tablet Take 0.5 mg by mouth every 8 (eight) hours. Pt takes with a '1mg'$  tablet.    Marland Kitchen LORazepam (ATIVAN) 1 MG tablet Take 1 mg by mouth every 8 (eight) hours. Pt takes with a 0.'5mg'$  tablet.    Marland Kitchen LORazepam (ATIVAN) 1 MG tablet Take 1 tablet (1 mg total) by mouth every 8 (eight) hours as needed for anxiety. 90 tablet 0  . potassium chloride SA (K-DUR,KLOR-CON) 20 MEQ tablet Take 1 tablet (20 mEq total) by mouth daily.  30 tablet 3  . predniSONE (DELTASONE) 10 MG tablet Take 1 tablet (10 mg total) by mouth daily with breakfast. 30 tablet 2  . QUEtiapine (SEROQUEL XR) 300 MG 24 hr tablet Take 1 tablet (300 mg total) by mouth at bedtime. 90 tablet 1  . senna-docusate (SENOKOT-S) 8.6-50 MG tablet Take 1 tablet by mouth 2 (two) times daily. 30 tablet 0  . tamsulosin (FLOMAX) 0.4 MG CAPS capsule Take 0.4 mg by mouth daily after breakfast.     .  tiotropium (SPIRIVA) 18 MCG inhalation capsule Place 18 mcg into inhaler and inhale daily.     Marland Kitchen venlafaxine XR (EFFEXOR-XR) 150 MG 24 hr capsule Take 1 capsule (150 mg total) by mouth daily. 90 capsule 1  . zolpidem (AMBIEN) 10 MG tablet Take 1 tablet by mouth at bedtime.     No current facility-administered medications for this visit.    Medical Decision Making:  Established Problem, Stable/Improving (1), Review of Medication Regimen & Side Effects (2) and Review of New Medication or Change in Dosage (2)  Treatment Plan Summary:Medication management and Plan   Major depressive disorder, recurrent severe with psychotic features-stable on Effexor 150 mg and Seroquel 300 mg at bedtime. Patient not interested in changing the dosage.  Generalized anxiety disorder-we'll continue the Effexor. Discussed decreasing the lorazepam. Patient was educated about the side effects of being on benzodiazepines at his age and discussed the adverse effects of increased sedation, risk of falls and risk of memory impairment. However patient insists that he needs his 3 mg of lorazepam daily. Patient not willing to taper and will obtain lorazepam in the future from his primary care physician.  Return to clinic in 3 months time or call before if needed    Lukasz Rogus 09/21/2015, 10:31 AM

## 2015-10-12 ENCOUNTER — Telehealth: Payer: Self-pay | Admitting: *Deleted

## 2015-10-12 NOTE — Telephone Encounter (Signed)
Called to ask that we refill his Ambien and Lorazepam which he has been taking for >30 years ordered by his psychiatrist who gave him a 3 month supply before [place]. I had asked him to call his PCP and he did and called me back stating that he is off. I then called Dr Sondra Come office and spoke with nurse who said that she had discussed with on call md last week who stated he will not take over ordering this med. He has received 990 tablets of Lorazepam  Combination of 1 mg and 0.5 mg tablets since January and has also been on Ambien 10 mg. I spoke with Dr Rogue Bussing to see if he would refill for hi,m and he also declined to fill this for him. I informed patient who said he understands and said that he will get it from someone else.

## 2015-10-15 ENCOUNTER — Telehealth: Payer: Self-pay

## 2015-10-15 ENCOUNTER — Ambulatory Visit
Admission: RE | Admit: 2015-10-15 | Discharge: 2015-10-15 | Disposition: A | Discharge status: Home / Self Care | Payer: Medicare HMO | Source: Ambulatory Visit | Attending: Oncology | Admitting: Oncology

## 2015-10-15 DIAGNOSIS — R918 Other nonspecific abnormal finding of lung field: Secondary | ICD-10-CM | POA: Diagnosis not present

## 2015-10-15 DIAGNOSIS — C3492 Malignant neoplasm of unspecified part of left bronchus or lung: Secondary | ICD-10-CM | POA: Diagnosis not present

## 2015-10-15 MED ORDER — IOPAMIDOL (ISOVUE-300) INJECTION 61%
75.0000 mL | Freq: Once | INTRAVENOUS | Status: AC | PRN
  Administered 2015-10-15: 75 mL via INTRAVENOUS

## 2015-10-15 NOTE — Telephone Encounter (Signed)
per lea caseworker called left message that she needed someone to call her back today.  ( I was in a training from 3-5) message was left on my door on a posted note.

## 2015-10-15 NOTE — Telephone Encounter (Signed)
called and left a message on caseworker office number that was left .  also tried to call caseworker cell phone # and mail box was full could not leave a message on cell phone

## 2015-10-16 ENCOUNTER — Emergency Department
Admission: EM | Admit: 2015-10-16 | Discharge: 2015-10-16 | Disposition: A | Discharge status: Home / Self Care | Payer: Medicare HMO | Attending: Emergency Medicine | Admitting: Emergency Medicine

## 2015-10-16 ENCOUNTER — Emergency Department: Payer: Medicare HMO

## 2015-10-16 ENCOUNTER — Other Ambulatory Visit: Payer: Self-pay | Admitting: *Deleted

## 2015-10-16 DIAGNOSIS — F333 Major depressive disorder, recurrent, severe with psychotic symptoms: Secondary | ICD-10-CM | POA: Diagnosis not present

## 2015-10-16 DIAGNOSIS — Z87891 Personal history of nicotine dependence: Secondary | ICD-10-CM | POA: Insufficient documentation

## 2015-10-16 DIAGNOSIS — J449 Chronic obstructive pulmonary disease, unspecified: Secondary | ICD-10-CM | POA: Diagnosis not present

## 2015-10-16 DIAGNOSIS — Z760 Encounter for issue of repeat prescription: Secondary | ICD-10-CM

## 2015-10-16 DIAGNOSIS — Z85118 Personal history of other malignant neoplasm of bronchus and lung: Secondary | ICD-10-CM | POA: Diagnosis not present

## 2015-10-16 DIAGNOSIS — Z7951 Long term (current) use of inhaled steroids: Secondary | ICD-10-CM | POA: Insufficient documentation

## 2015-10-16 DIAGNOSIS — R0602 Shortness of breath: Secondary | ICD-10-CM

## 2015-10-16 DIAGNOSIS — Z79899 Other long term (current) drug therapy: Secondary | ICD-10-CM | POA: Insufficient documentation

## 2015-10-16 DIAGNOSIS — J45909 Unspecified asthma, uncomplicated: Secondary | ICD-10-CM | POA: Insufficient documentation

## 2015-10-16 DIAGNOSIS — C3492 Malignant neoplasm of unspecified part of left bronchus or lung: Secondary | ICD-10-CM

## 2015-10-16 LAB — COMPREHENSIVE METABOLIC PANEL
ALT: 13 U/L — AB (ref 17–63)
ANION GAP: 9 (ref 5–15)
AST: 15 U/L (ref 15–41)
Albumin: 3.2 g/dL — ABNORMAL LOW (ref 3.5–5.0)
Alkaline Phosphatase: 67 U/L (ref 38–126)
BUN: 19 mg/dL (ref 6–20)
CHLORIDE: 102 mmol/L (ref 101–111)
CO2: 27 mmol/L (ref 22–32)
Calcium: 8.4 mg/dL — ABNORMAL LOW (ref 8.9–10.3)
Creatinine, Ser: 1.17 mg/dL (ref 0.61–1.24)
GFR, EST NON AFRICAN AMERICAN: 55 mL/min — AB (ref >60)
Glucose, Bld: 132 mg/dL — ABNORMAL HIGH (ref 65–99)
POTASSIUM: 3.8 mmol/L (ref 3.5–5.1)
SODIUM: 138 mmol/L (ref 135–145)
Total Bilirubin: 0.2 mg/dL — ABNORMAL LOW (ref 0.3–1.2)
Total Protein: 6.8 g/dL (ref 6.5–8.1)

## 2015-10-16 LAB — CBC
HCT: 33 % — ABNORMAL LOW (ref 40.0–52.0)
Hemoglobin: 10.9 g/dL — ABNORMAL LOW (ref 13.0–18.0)
MCH: 28.8 pg (ref 26.0–34.0)
MCHC: 33 g/dL (ref 32.0–36.0)
MCV: 87.3 fL (ref 80.0–100.0)
PLATELETS: 193 10*3/uL (ref 150–440)
RBC: 3.79 MIL/uL — AB (ref 4.40–5.90)
RDW: 18.3 % — AB (ref 11.5–14.5)
WBC: 6.6 10*3/uL (ref 3.8–10.6)

## 2015-10-16 LAB — TROPONIN I

## 2015-10-16 MED ORDER — LORAZEPAM 1 MG PO TABS
1.0000 mg | ORAL_TABLET | Freq: Once | ORAL | Status: AC
  Administered 2015-10-16: 1 mg via ORAL

## 2015-10-16 MED ORDER — LORAZEPAM 1 MG PO TABS
ORAL_TABLET | ORAL | Status: AC
Start: 2015-10-16 — End: 2015-10-16
  Administered 2015-10-16: 1 mg via ORAL
  Filled 2015-10-16: qty 1

## 2015-10-16 MED ORDER — LORAZEPAM 1 MG PO TABS: 1.0000 mg | ORAL_TABLET | Freq: Two times a day (BID) | ORAL | Status: DC

## 2015-10-16 MED ORDER — LORAZEPAM 1 MG PO TABS
ORAL_TABLET | ORAL | Status: AC
  Administered 2015-10-16: 1 mg via ORAL
  Filled 2015-10-16: qty 1

## 2015-10-16 MED ORDER — IPRATROPIUM-ALBUTEROL 0.5-2.5 (3) MG/3ML IN SOLN
3.0000 mL | Freq: Once | RESPIRATORY_TRACT | Status: AC
  Administered 2015-10-16: 3 mL via RESPIRATORY_TRACT
  Filled 2015-10-16: qty 3

## 2015-10-16 NOTE — ED Notes (Signed)
Pt discharged to home.  [facility] caseworker driving pt home.  Discharge instructions reviewed.  Verbalized understanding.  No questions or concerns at this time.  Teach back verified.  Pt in NAD.  No items left in ED.

## 2015-10-16 NOTE — ED Notes (Signed)
Pt c/o that he has been out of his lorazepam since yesterday, has been taking it x50years. Pt is anxious and some SOB, hx of emphysema and currently has lung cancer. Adult protective services is here with pt following his case. Pt A&O

## 2015-10-16 NOTE — Discharge Instructions (Signed)
Medicine Refill at the Emergency Department We have refilled your medicine today, but it is best for you to get refills through your primary health care provider's office. In the future, please plan ahead so you do not need to get refills from the emergency department. If the medicine we refilled was a maintenance medicine, you may have received only enough to get you by until you are able to see your regular health care provider.   This information is not intended to replace advice given to you by your health care provider. Make sure you discuss any questions you have with your health care provider.   Document Released: 07/22/2003 Document Revised: 04/25/2014 Document Reviewed: 07/12/2013 Elsevier Interactive Patient Education 2016 Else[employer] Inc[employer]  Shortness of Breath Shortness of breath means you have trouble breathing. It could also mean that you have a medical problem. You should get immediate medical care for shortness of breath. CAUSES   Not enough oxygen in the air such as with high altitudes or a smoke-filled room.  Certain lung diseases, infections, or problems.  Heart disease or conditions, such as angina or heart failure.  Low red blood cells (anemia).  Poor physical fitness, which can cause shortness of breath when you exercise.  Chest or back injuries or stiffness.  Being overweight.  Smoking.  Anxiety, which can make you feel like you are not getting enough air. DIAGNOSIS  Serious medical problems can often be found during your physical exam. Tests may also be done to determine why you are having shortness of breath. Tests may include:  Chest X-rays.  Lung function tests.  Blood tests.  An electrocardiogram (ECG).  An ambulatory electrocardiogram. An ambulatory ECG records your heartbeat patterns over a 24-hour period.  Exercise testing.  A transthoracic echocardiogram (TTE). During echocardiography, sound waves are used to evaluate how blood flows through  your heart.  A transesophageal echocardiogram (TEE).  Imaging scans. Your health care provider may not be able to find a cause for your shortness of breath after your exam. In this case, it is important to have a follow-up exam with your health care provider as directed.  TREATMENT  Treatment for shortness of breath depends on the cause of your symptoms and can vary greatly. HOME CARE INSTRUCTIONS   Do not smoke. Smoking is a common cause of shortness of breath. If you smoke, ask for help to quit.  Avoid being around chemicals or things that may bother your breathing, such as paint fumes and dust.  Rest as needed. Slowly resume your usual activities.  If medicines were prescribed, take them as directed for the full length of time directed. This includes oxygen and any inhaled medicines.  Keep all follow-up appointments as directed by your health care provider. SEEK MEDICAL CARE IF:   Your condition does not improve in the time expected.  You have a hard time doing your normal activities even with rest.  You have any new symptoms. SEEK IMMEDIATE MEDICAL CARE IF:   Your shortness of breath gets worse.  You feel light-headed, faint, or develop a cough not controlled with medicines.  You start coughing up blood.  You have pain with breathing.  You have chest pain or pain in your arms, shoulders, or abdomen.  You have a fever.  You are unable to walk up stairs or exercise the way you normally do. MAKE SURE YOU:  Understand these instructions.  Will watch your condition.  Will get help right away if you are not doing  well or get worse.   This information is not intended to replace advice given to you by your health care provider. Make sure you discuss any questions you have with your health care provider.   Document Released: 12/28/2000 Document Revised: 04/09/2013 Document Reviewed: 06/20/2011 Elsevier Interactive Patient Education [employer].

## 2015-10-16 NOTE — ED Provider Notes (Signed)
[facility] Emergency Department Provider Note  ____________________________________________    I have reviewed the triage vital signs and the nursing notes.   HISTORY  Chief Complaint Shortness of Breath    HPI Timothy Ali is a 82 y.o. male who presents with mild shortness of breath. Patient reports this is chronic for him and that his oxygen saturation is typically between 89 and 91%. He presents with Adult Materials engineer who is concerned because the patient has been taking lorazepam for 30 years but has completely run out because his provider stopped giving it to him. He is not on a taper. APS reports patient seems somewhat jittery and anxious. No chest pain. No calf pain or swelling     Past Medical History  Diagnosis Date  . Major depressive disorder, recurrent, severe with psychotic features ([place])   . Persistent disorder of initiating or maintaining sleep   . Generalized anxiety disorder   . Major depressive disorder, recurrent, severe with psychotic features ([place])   . Persistent disorder of initiating or maintaining sleep   . Asthma   . COPD (chronic obstructive pulmonary disease) ([place])   . Cancer [facility])     Left lung    Patient Active Problem List   Diagnosis Date Noted  . Dementia 08/31/2015  . Generalized anxiety disorder 08/31/2015  . SOB (shortness of breath) 08/19/2015  . BPH (benign prostatic hypertrophy) with urinary retention 02/16/2015  . Metastatic lung cancer (metastasis from lung to other site) ([place]) 02/16/2015  . Lung cancer ([place]) 10/24/2014  . Acute on chronic respiratory failure ([place]) 10/23/2014  . Leucocytosis 10/23/2014  . COPD (chronic obstructive pulmonary disease) ([place]) 10/23/2014  . Carcinoma, lung ([place]) 10/23/2014  . Cancer of lung ([place]) 10/23/2014  . CAFL (chronic airflow limitation) ([place]) 08/01/2014  . Depression, major, recurrent, severe with psychosis ([place]) 08/01/2014  . H/O gastrointestinal  disease 08/01/2014  . Anxiety, generalized 08/01/2014  . H/O elevated lipids 08/01/2014  . Insomnia, persistent 08/01/2014  . H/O malignant neoplasm 08/01/2014  . Prostate disease 08/01/2014  . Macular degeneration 08/01/2014  . Obstruction of urinary tract 08/01/2014  . Chronic obstructive pulmonary disease ([place]) 08/01/2014  . Severe episode of recurrent major depressive disorder, with psychotic features ([place]) 08/01/2014    Past Surgical History  Procedure Laterality Date  . Hemorroidectomy    . Lung surgery      Current Outpatient Rx  Name  Route  Sig  Dispense  Refill  . albuterol (PROVENTIL HFA;VENTOLIN HFA) 108 (90 BASE) MCG/ACT inhaler   Inhalation   Inhale 2 puffs into the lungs every 4 (four) hours as needed for wheezing or shortness of breath.          Marland Kitchen atorvastatin (LIPITOR) 40 MG tablet   Oral   Take 40 mg by mouth at bedtime.          Marland Kitchen BREO ELLIPTA 100-25 MCG/INH AEPB   Inhalation   Inhale 1 puff into the lungs daily.            Dispense as written.   Marland Kitchen esomeprazole (NEXIUM) 40 MG capsule   Oral   Take 40 mg by mouth daily.          Marland Kitchen guaiFENesin (MUCINEX) 600 MG 12 hr tablet   Oral   Take 1 tablet (600 mg total) by mouth 2 (two) times daily.   20 tablet   0   . ipratropium-albuterol (DUONEB) 0.5-2.5 (3) MG/3ML SOLN   Nebulization  Take 3 mLs by nebulization every 6 (six) hours as needed.   360 mL   1   . levofloxacin (LEVAQUIN) 500 MG tablet   Oral   Take 1 tablet by mouth daily. Reported on 08/30/2015         . LORazepam (ATIVAN) 1 MG tablet   Oral   Take 1 tablet (1 mg total) by mouth 2 (two) times daily.   21 tablet   0   . potassium chloride SA (K-DUR,KLOR-CON) 20 MEQ tablet   Oral   Take 1 tablet (20 mEq total) by mouth daily.   30 tablet   3   . predniSONE (DELTASONE) 10 MG tablet   Oral   Take 1 tablet (10 mg total) by mouth daily with breakfast.   30 tablet   2   . QUEtiapine (SEROQUEL XR) 300 MG 24 hr tablet    Oral   Take 1 tablet (300 mg total) by mouth at bedtime.   90 tablet   1     HOLD FILLING UNTIL PATIENT CALLS FOR REFILL.   Marland Kitchen senna-docusate (SENOKOT-S) 8.6-50 MG tablet   Oral   Take 1 tablet by mouth 2 (two) times daily.   30 tablet   0   . tamsulosin (FLOMAX) 0.4 MG CAPS capsule   Oral   Take 0.4 mg by mouth daily after breakfast.          . tiotropium (SPIRIVA) 18 MCG inhalation capsule   Inhalation   Place 18 mcg into inhaler and inhale daily.          Marland Kitchen venlafaxine XR (EFFEXOR-XR) 150 MG 24 hr capsule   Oral   Take 1 capsule (150 mg total) by mouth daily.   90 capsule   1     HOLD FILLING UNTIL PATIENT CALLS FOR REFILL.   Marland Kitchen zolpidem (AMBIEN) 10 MG tablet   Oral   Take 1 tablet by mouth at bedtime.           Allergies Penicillins  Family History  Problem Relation Age of Onset  . COPD Mother   . Lung cancer Father   . Kidney disease Neg Hx   . Prostate cancer Neg Hx     Social History Social History  Substance Use Topics  . Smoking status: Former Research scientist (life sciences)  . Smokeless tobacco: Never Used     Comment: quit 18 years ago  . Alcohol Use: No    Review of Systems  Constitutional: Negative for fever. Eyes: Negative for redness ENT: Negative for sore throat Cardiovascular: Negative for chest pain Respiratory: As above Gastrointestinal: Negative for abdominal pain Genitourinary: Negative for dysuria. Musculoskeletal: Negative for back pain. Skin: Negative for rash. Neurological: Negative for focal weakness Psychiatric: no anxiety    ____________________________________________   PHYSICAL EXAM:  VITAL SIGNS: ED Triage Vitals  Enc Vitals Group     BP 10/16/15 1121 142/72 mmHg     Pulse Rate 10/16/15 1121 108     Resp 10/16/15 1121 30     Temp 10/16/15 1121 97.8 F (36.6 C)     Temp Source 10/16/15 1121 Oral     SpO2 10/16/15 1121 89 %     Weight 10/16/15 1121 130 lb (58.968 kg)     Height 10/16/15 1121 5' 7.5" (1.715 m)     Head Cir  --      Peak Flow --      Pain Score 10/16/15 1130 0     Pain Loc --  Pain Edu? --      Excl. in [place]? --     Constitutional: AlertBut disoriented. Well appearing and in no distress.  Eyes: Conjunctivae are normal. No erythema or injection ENT   Head: Normocephalic and atraumatic.   Mouth/Throat: Mucous membranes are moist. Cardiovascular: Normal rate, regular rhythm. Normal and symmetric distal pulses are present in the upper extremities.  Respiratory: Normal respiratory effort without tachypnea nor retractions. Breath sounds are clear and equal bilaterally.  Gastrointestinal: Soft and non-tender in all quadrants. No distention. There is no CVA tenderness. Genitourinary: deferred Musculoskeletal: Nontender with normal range of motion in all extremities. No lower extremity tenderness nor edema. Neurologic:  Normal speech and language. No gross focal neurologic deficits are appreciated. Skin:  Skin is warm, dry and intact. No rash noted. Psychiatric: Mood and affect are normal. Patient exhibits appropriate insight and judgment.  ____________________________________________    LABS (pertinent positives/negatives)  Labs Reviewed  CBC - Abnormal; Notable for the following:    RBC 3.79 (*)    Hemoglobin 10.9 (*)    HCT 33.0 (*)    RDW 18.3 (*)    All other components within normal limits  COMPREHENSIVE METABOLIC PANEL - Abnormal; Notable for the following:    Glucose, Bld 132 (*)    Calcium 8.4 (*)    Albumin 3.2 (*)    ALT 13 (*)    Total Bilirubin 0.2 (*)    GFR calc non Af Amer 55 (*)    All other components within normal limits  TROPONIN I    ____________________________________________   EKG ED ECG REPORT I, Lavonia Drafts, the attending physician, personally viewed and interpreted this ECG.  Date: 10/16/2015 EKG Time: 11:31 AM  Rate: 99 Rhythm: normal sinus rhythm QRS Axis: normal Intervals: normal ST/T Wave abnormalities: non specific Conduction  Disturbances: none    ____________________________________________    RADIOLOGY  X-ray unchanged from recent CT  ____________________________________________   PROCEDURES  Procedure(s) performed: none  Critical Care performed: none  ____________________________________________   INITIAL IMPRESSION / ASSESSMENT AND PLAN / ED COURSE  Pertinent labs & imaging results that were available during my care of the patient were reviewed by me and considered in my medical decision making (see chart for details).  Patient monitored in the emergency department. He states he feels his breathing is fine and is most concerned about his lorazepam. Lab work is unremarkable. Chest x-ray is unrevealing. I will prescribe a one week supply of lorazepam as the patient has follow-up in one week with his psychiatrist  ____________________________________________   FINAL CLINICAL IMPRESSION(S) / ED DIAGNOSES  Final diagnoses:  Shortness of breath  Medication refill          Lavonia Drafts, MD 10/16/15 [phone]

## 2015-10-16 NOTE — ED Notes (Signed)
Pt arrives via his APS case worker, pt has been out of his lorazepam since yesterday, there has been a conflict in having his prescription re-written. Pt states hx of emphysema and cancer with multiple places affected in his body, case worker concerned about him being out of his ativan

## 2015-10-19 ENCOUNTER — Inpatient Hospital Stay: Payer: Medicare HMO

## 2015-10-19 ENCOUNTER — Inpatient Hospital Stay: Payer: Medicare HMO | Attending: Internal Medicine | Admitting: Internal Medicine

## 2015-10-19 VITALS — BP 143/89 | HR 99 | Temp 97.6°F | Resp 18 | Wt 134.5 lb

## 2015-10-19 DIAGNOSIS — C3492 Malignant neoplasm of unspecified part of left bronchus or lung: Secondary | ICD-10-CM

## 2015-10-19 DIAGNOSIS — F329 Major depressive disorder, single episode, unspecified: Secondary | ICD-10-CM | POA: Insufficient documentation

## 2015-10-19 DIAGNOSIS — J449 Chronic obstructive pulmonary disease, unspecified: Secondary | ICD-10-CM

## 2015-10-19 DIAGNOSIS — F29 Unspecified psychosis not due to a substance or known physiological condition: Secondary | ICD-10-CM

## 2015-10-19 DIAGNOSIS — Z87891 Personal history of nicotine dependence: Secondary | ICD-10-CM | POA: Diagnosis not present

## 2015-10-19 DIAGNOSIS — R05 Cough: Secondary | ICD-10-CM

## 2015-10-19 DIAGNOSIS — Z79899 Other long term (current) drug therapy: Secondary | ICD-10-CM | POA: Insufficient documentation

## 2015-10-19 DIAGNOSIS — C3412 Malignant neoplasm of upper lobe, left bronchus or lung: Secondary | ICD-10-CM | POA: Diagnosis not present

## 2015-10-19 DIAGNOSIS — C7951 Secondary malignant neoplasm of bone: Secondary | ICD-10-CM | POA: Diagnosis not present

## 2015-10-19 DIAGNOSIS — R0602 Shortness of breath: Secondary | ICD-10-CM | POA: Diagnosis not present

## 2015-10-19 LAB — CBC WITH DIFFERENTIAL/PLATELET
BASOS PCT: 0 %
Basophils Absolute: 0 10*3/uL (ref 0–0.1)
EOS ABS: 0.2 10*3/uL (ref 0–0.7)
EOS PCT: 2 %
HCT: 32.9 % — ABNORMAL LOW (ref 40.0–52.0)
HEMOGLOBIN: 11.2 g/dL — AB (ref 13.0–18.0)
LYMPHS ABS: 1.1 10*3/uL (ref 1.0–3.6)
Lymphocytes Relative: 11 %
MCH: 29.8 pg (ref 26.0–34.0)
MCHC: 34.1 g/dL (ref 32.0–36.0)
MCV: 87.2 fL (ref 80.0–100.0)
MONO ABS: 1 10*3/uL (ref 0.2–1.0)
MONOS PCT: 10 %
Neutro Abs: 8.2 10*3/uL — ABNORMAL HIGH (ref 1.4–6.5)
Neutrophils Relative %: 77 %
PLATELETS: 202 10*3/uL (ref 150–440)
RBC: 3.78 MIL/uL — ABNORMAL LOW (ref 4.40–5.90)
RDW: 18.2 % — AB (ref 11.5–14.5)
WBC: 10.6 10*3/uL (ref 3.8–10.6)

## 2015-10-19 LAB — COMPREHENSIVE METABOLIC PANEL
ALK PHOS: 77 U/L (ref 38–126)
ALT: 19 U/L (ref 17–63)
ANION GAP: 6 (ref 5–15)
AST: 19 U/L (ref 15–41)
Albumin: 3.1 g/dL — ABNORMAL LOW (ref 3.5–5.0)
BILIRUBIN TOTAL: 0.2 mg/dL — AB (ref 0.3–1.2)
BUN: 25 mg/dL — AB (ref 6–20)
CALCIUM: 8.1 mg/dL — AB (ref 8.9–10.3)
CO2: 26 mmol/L (ref 22–32)
Chloride: 104 mmol/L (ref 101–111)
Creatinine, Ser: 0.89 mg/dL (ref 0.61–1.24)
GFR calc Af Amer: >60 mL/min (ref >60)
Glucose, Bld: 119 mg/dL — ABNORMAL HIGH (ref 65–99)
POTASSIUM: 4.2 mmol/L (ref 3.5–5.1)
SODIUM: 136 mmol/L (ref 135–145)
TOTAL PROTEIN: 6.6 g/dL (ref 6.5–8.1)

## 2015-10-19 MED ORDER — LEVOFLOXACIN 500 MG PO TABS: 500.0000 mg | ORAL_TABLET | Freq: Every day | ORAL | Status: DC

## 2015-10-19 NOTE — Progress Notes (Signed)
Patient states his left lung area feels tight.  He states he has been coughing up grayish brown sputum for the past one week.

## 2015-10-19 NOTE — Progress Notes (Signed)
[facility] OFFICE PROGRESS NOTE  Patient Care Team: Albina Billet, MD as PCP - General (Internal Medicine) Cammie Sickle, MD as Consulting Physician (Hematology and Oncology)  Carcinoma, lung [facility])   Staging form: Lung, AJCC 7th Edition     Clinical: Stage IIIA (T4, N0, M0) - Signed by Forest Gleason, MD on 10/29/2014 Metastatic lung cancer (metastasis from lung to other site) [facility])   Staging form: Lung, AJCC 7th Edition     Clinical: No stage assigned - Unsigned    Oncology History   # LUL SQUAMOUS CELL CA [s/p Bx] STAGE I s/p SBRT [Feb 2015]  # July 2016- T4 (invading vertebral body) N0 [Stage III;NO bx;  CT/PET] s/p Chemo-RT [July 2016- Sep 4th, 2016  #NOv 2016 Progression on scan/ persistent pain- Tecentriq; Discontinued in April 2017/ Progression based on PET.   # June 29th CT- Progression;July Start Carbo-Gemcitabine q 2W     Carcinoma, lung ([place])   10/23/2014 Initial Diagnosis Carcinoma, lung    Lung cancer ([place])   10/24/2014 Initial Diagnosis Lung cancer    Metastatic lung cancer (metastasis from lung to other site) ([place])   02/16/2015 Initial Diagnosis Metastatic lung cancer (metastasis from lung to other site) [facility])    Cancer of upper lobe of left lung ([place])   10/20/2015 Initial Diagnosis Cancer of upper lobe of left lung ([place])    INTERVAL HISTORY:  This is my first interaction with the patient as patient's primary oncologist has been Dr.Choksi. I reviewed the patient's prior charts/pertinent labs/imaging in detail; findings are summarized above.    Timothy Ali 82 y.o.  male pleasant patient above history of recurrent/progressive squamous cell lung cancer- most recently treated with immunotherapy; discontinued in April 2017 because of progression is here for follow-up/to review the results of his restaging CAT scan.  Patient continues to feel poorly; complains of progressive cough; no emoptysis. Clear sputum. Complains of worsening shortness of  breath. No nausea no vomiting. Complains of fatigue. Also complains of chest hurting on the left side with the cough. Otherwise No headaches. Positive for weight loss.   REVIEW OF SYSTEMS:  A complete 10 point review of system is done which is negative except mentioned above/history of present illness.   PAST MEDICAL HISTORY :  Past Medical History  Diagnosis Date  . Major depressive disorder, recurrent, severe with psychotic features ([place])   . Persistent disorder of initiating or maintaining sleep   . Generalized anxiety disorder   . Major depressive disorder, recurrent, severe with psychotic features ([place])   . Persistent disorder of initiating or maintaining sleep   . Asthma   . COPD (chronic obstructive pulmonary disease) ([place])   . Cancer ([place])     Left lung    PAST SURGICAL HISTORY :   Past Surgical History  Procedure Laterality Date  . Hemorroidectomy    . Lung surgery      FAMILY HISTORY :   Family History  Problem Relation Age of Onset  . COPD Mother   . Lung cancer Father   . Kidney disease Neg Hx   . Prostate cancer Neg Hx     SOCIAL HISTORY:   Social History  Substance Use Topics  . Smoking status: Former Research scientist (life sciences)  . Smokeless tobacco: Never Used     Comment: quit 18 years ago  . Alcohol Use: No    ALLERGIES:  is allergic to penicillins.  MEDICATIONS:  Current Outpatient Prescriptions  Medication Sig Dispense Refill  .  albuterol (PROVENTIL HFA;VENTOLIN HFA) 108 (90 BASE) MCG/ACT inhaler Inhale 2 puffs into the lungs every 4 (four) hours as needed for wheezing or shortness of breath.     Marland Kitchen atorvastatin (LIPITOR) 40 MG tablet Take 40 mg by mouth at bedtime.     Marland Kitchen BREO ELLIPTA 100-25 MCG/INH AEPB Inhale 1 puff into the lungs daily.     Marland Kitchen esomeprazole (NEXIUM) 40 MG capsule Take 40 mg by mouth daily.     Marland Kitchen guaiFENesin (MUCINEX) 600 MG 12 hr tablet Take 1 tablet (600 mg total) by mouth 2 (two) times daily. 20 tablet 0  . LORazepam (ATIVAN) 1 MG tablet Take 1  tablet (1 mg total) by mouth 2 (two) times daily. 21 tablet 0  . potassium chloride SA (K-DUR,KLOR-CON) 20 MEQ tablet Take 1 tablet (20 mEq total) by mouth daily. 30 tablet 3  . predniSONE (DELTASONE) 10 MG tablet Take 1 tablet (10 mg total) by mouth daily with breakfast. 30 tablet 2  . QUEtiapine (SEROQUEL XR) 300 MG 24 hr tablet Take 1 tablet (300 mg total) by mouth at bedtime. 90 tablet 1  . senna-docusate (SENOKOT-S) 8.6-50 MG tablet Take 1 tablet by mouth 2 (two) times daily. 30 tablet 0  . tamsulosin (FLOMAX) 0.4 MG CAPS capsule Take 0.4 mg by mouth daily after breakfast.     . tiotropium (SPIRIVA) 18 MCG inhalation capsule Place 18 mcg into inhaler and inhale daily.     Marland Kitchen venlafaxine XR (EFFEXOR-XR) 150 MG 24 hr capsule Take 1 capsule (150 mg total) by mouth daily. 90 capsule 1  . zolpidem (AMBIEN) 10 MG tablet Take 1 tablet by mouth at bedtime.    Marland Kitchen ipratropium-albuterol (DUONEB) 0.5-2.5 (3) MG/3ML SOLN Take 3 mLs by nebulization every 6 (six) hours as needed. (Patient not taking: Reported on 10/19/2015) 360 mL 1  . levofloxacin (LEVAQUIN) 500 MG tablet Take 1 tablet (500 mg total) by mouth daily. 14 tablet 0  . ondansetron (ZOFRAN) 8 MG tablet Take 8 mg by mouth every 8 (eight) hours as needed for nausea. Reported on 10/19/2015     No current facility-administered medications for this visit.    PHYSICAL EXAMINATION: ECOG PERFORMANCE STATUS: 1 - Symptomatic but completely ambulatory  BP 143/89 mmHg  Pulse 99  Temp(Src) 97.6 F (36.4 C) (Tympanic)  Resp 18  Wt 134 lb 7.7 oz (61 kg)  SpO2   Filed Weights   10/19/15 1127  Weight: 134 lb 7.7 oz (61 kg)    GENERAL: Well-nourished well-developed; Alert, no distress and comfortable. He is walking by himself.  EYES: no pallor or icterus OROPHARYNX: no thrush or ulceration; good dentition  NECK: supple, no masses felt LYMPH:  no palpable lymphadenopathy in the cervical, axillary or inguinal regions LUNGS: Decreased breath sounds to  auscultation; coarse breath sounds. HEART/CVS: regular rate & rhythm and no murmurs; No lower extremity edema ABDOMEN:abdomen soft, non-tender and normal bowel sounds Musculoskeletal:no cyanosis of digits and no clubbing  PSYCH: alert & oriented x 3 with fluent speech NEURO: no focal motor/sensory deficits SKIN:  no rashes or significant lesions  LABORATORY DATA:  I have reviewed the data as listed    Component Value Date/Time   NA 136 10/19/2015 1036   NA 141 04/09/2014 1442   NA 142 04/02/2014   K 4.2 10/19/2015 1036   K 4.2 04/09/2014 1442   CL 104 10/19/2015 1036   CL 104 04/09/2014 1442   CO2 26 10/19/2015 1036   CO2 30 04/09/2014 1442  GLUCOSE 119* 10/19/2015 1036   GLUCOSE 92 04/09/2014 1442   BUN 25* 10/19/2015 1036   BUN 18 04/09/2014 1442   BUN 16 04/02/2014   CREATININE 0.89 10/19/2015 1036   CREATININE 0.93 04/09/2014 1442   CREATININE 0.8 04/02/2014   CALCIUM 8.1* 10/19/2015 1036   CALCIUM 8.2* 04/09/2014 1442   PROT 6.6 10/19/2015 1036   PROT 6.5 04/09/2014 1442   ALBUMIN 3.1* 10/19/2015 1036   ALBUMIN 2.4* 04/09/2014 1442   AST 19 10/19/2015 1036   AST 33 04/09/2014 1442   ALT 19 10/19/2015 1036   ALT 18 04/09/2014 1442   ALKPHOS 77 10/19/2015 1036   ALKPHOS 65 04/09/2014 1442   BILITOT 0.2* 10/19/2015 1036   BILITOT 0.4 04/09/2014 1442   GFRNONAA >60 10/19/2015 1036   GFRNONAA >60 04/09/2014 1442   GFRNONAA >60 12/21/2013 0852   GFRAA >60 10/19/2015 1036   GFRAA >60 04/09/2014 1442   GFRAA >60 12/21/2013 0852    No results found for: SPEP, UPEP  Lab Results  Component Value Date   WBC 10.6 10/19/2015   NEUTROABS 8.2* 10/19/2015   HGB 11.2* 10/19/2015   HCT 32.9* 10/19/2015   MCV 87.2 10/19/2015   PLT 202 10/19/2015      Chemistry      Component Value Date/Time   NA 136 10/19/2015 1036   NA 141 04/09/2014 1442   NA 142 04/02/2014   K 4.2 10/19/2015 1036   K 4.2 04/09/2014 1442   CL 104 10/19/2015 1036   CL 104 04/09/2014 1442    CO2 26 10/19/2015 1036   CO2 30 04/09/2014 1442   BUN 25* 10/19/2015 1036   BUN 18 04/09/2014 1442   BUN 16 04/02/2014   CREATININE 0.89 10/19/2015 1036   CREATININE 0.93 04/09/2014 1442   CREATININE 0.8 04/02/2014   GLU 9 04/02/2014      Component Value Date/Time   CALCIUM 8.1* 10/19/2015 1036   CALCIUM 8.2* 04/09/2014 1442   ALKPHOS 77 10/19/2015 1036   ALKPHOS 65 04/09/2014 1442   AST 19 10/19/2015 1036   AST 33 04/09/2014 1442   ALT 19 10/19/2015 1036   ALT 18 04/09/2014 1442   BILITOT 0.2* 10/19/2015 1036   BILITOT 0.4 04/09/2014 1442     Mediastinum/Lymph Nodes: Aortic atherosclerosis noted. Calcification involving the RCA and the left circumflex coronary artery. No axillary or supraclavicular adenopathy. The trachea appears patent. Normal appearance of the esophagus. There is no mediastinal or hilar adenopathy  Lungs/Pleura: With the index subpleural nodule within the posterior aspect of the superior segment of left lower lobe measures 1.1 cm, image 70 of series 3. Previously 0.8 cm. The paravertebral chest wall lesion measures 3.6 x 2.0 cm, image 28 of series 2. Previously 3.5 x 1.9 cm. There is pleural thickening and adjacent parenchymal scarring involving the posterior lateral left lower lobe, image 121 of series 3. This appears increased from previous exam. Similarly within the basilar portion of the left upper lobe there is pleural thickening and airspace consolidation containing air bronchograms, image 88 of series 3. This appears increased from previous exam. Parenchymal scarring, consolidation and architectural distortion within the posterior medial left upper lobe is new from previous exam, image 30 of series 3. New tree-in-bud nodularity within the posterior right lower lobe and posterior lateral right upper lobe noted, image 85 of series 3 and image 128 of series 3.  Upper abdomen: The adrenal glands are unremarkable. The visualized portions of the  liver, kidneys and spleen  are unremarkable.  Musculoskeletal: Spondylosis noted within the thoracic spine. The posterior medial left upper chest wall mass described above involves the left T3 and T for ribs and T4 vertebra.  IMPRESSION: 1. Mild increase in size of left posterior medial extra pleural chest wall lesion and left lower lobe subpleural nodule. Suspicious for progression of disease. 2. Interval development of multifocal interstitial and patchy opacities within the left upper lobe and left lower lobe which may reflect sequelae of external beam radiation. 3. Scattered tree-in-bud nodules within the posterior right lung are likely post infectious or inflammatory in etiology.   Electronically Signed  By: Kerby Moors M.D.  On: 10/15/2015 11:11  RADIOGRAPHIC STUDIES: I have personally reviewed the radiological images as listed and agreed with the findings in the report. No results found.   ASSESSMENT & PLAN:  Carcinoma, lung ([place]) Squamous cell lung cancer-  Cancer of upper lobe of left lung ([facility]) Progressive squamous cell lung cancer-based on the most recent CT scan. Patient is symptomatic- I recommend a trial of chemotherapy with gemcitabine and carboplatin every 2 weeks/secondary to poor performance status. Patient understands the treatment is palliative; given his age/the fact that he has progressed on previous immunotherapy- the response rate is limited.  # Cough/worsening shortness of breath/tree in bud- question underlying infection. Recommend a trial of antibiotics with Levaquin. If it does not improve- recommend a trial of steroids [question pneumonitis from immunotherapy; less likely].   # Psychosis /depression/ COPD.   # Patient follow-up with me in 2 weeks/ to start chemotherapy/labs CBC CMP.  I offered to talk to his PCP- however he states that he is planning to find a new PCP. He has sisters who live close by.    Orders Placed This Encounter   Procedures  . Comprehensive metabolic panel    Standing Status: Future     Number of Occurrences:      Standing Expiration Date: 10/18/2016    Order Specific Question:  Has the patient fasted?    Answer:  No  . CBC with Differential    Standing Status: Future     Number of Occurrences:      Standing Expiration Date: 10/18/2016   All questions were answered. The patient knows to call the clinic with any problems, questions or concerns.      Cammie Sickle, MD 10/20/2015 1:27 PM

## 2015-10-19 NOTE — Assessment & Plan Note (Signed)
Squamous cell lung cancer-

## 2015-10-20 DIAGNOSIS — C3412 Malignant neoplasm of upper lobe, left bronchus or lung: Secondary | ICD-10-CM | POA: Insufficient documentation

## 2015-10-20 NOTE — Assessment & Plan Note (Addendum)
Progressive squamous cell lung cancer-based on the most recent CT scan. Patient is symptomatic- I recommend a trial of chemotherapy with gemcitabine and carboplatin every 2 weeks/secondary to poor performance status. Patient understands the treatment is palliative; given his age/the fact that he has progressed on previous immunotherapy- the response rate is limited.  # Cough/worsening shortness of breath/tree in bud- question underlying infection. Recommend a trial of antibiotics with Levaquin. If it does not improve- recommend a trial of steroids [question pneumonitis from immunotherapy; less likely].   # Psychosis /depression/ COPD.   # Patient follow-up with me in 2 weeks/ to start chemotherapy/labs CBC CMP.  I offered to talk to his PCP- however he states that he is planning to find a new PCP. He has sisters who live close by.

## 2015-10-23 ENCOUNTER — Emergency Department: Payer: Medicare HMO

## 2015-10-23 ENCOUNTER — Emergency Department
Admission: EM | Admit: 2015-10-23 | Discharge: 2015-10-23 | Disposition: A | Discharge status: Home / Self Care | Payer: Medicare HMO | Attending: Emergency Medicine | Admitting: Emergency Medicine

## 2015-10-23 DIAGNOSIS — J45909 Unspecified asthma, uncomplicated: Secondary | ICD-10-CM | POA: Diagnosis not present

## 2015-10-23 DIAGNOSIS — Z87891 Personal history of nicotine dependence: Secondary | ICD-10-CM | POA: Diagnosis not present

## 2015-10-23 DIAGNOSIS — Z7951 Long term (current) use of inhaled steroids: Secondary | ICD-10-CM | POA: Insufficient documentation

## 2015-10-23 DIAGNOSIS — R0602 Shortness of breath: Secondary | ICD-10-CM | POA: Diagnosis present

## 2015-10-23 DIAGNOSIS — J441 Chronic obstructive pulmonary disease with (acute) exacerbation: Secondary | ICD-10-CM | POA: Diagnosis not present

## 2015-10-23 DIAGNOSIS — F333 Major depressive disorder, recurrent, severe with psychotic symptoms: Secondary | ICD-10-CM | POA: Diagnosis not present

## 2015-10-23 LAB — BASIC METABOLIC PANEL
Anion gap: 7 (ref 5–15)
BUN: 14 mg/dL (ref 6–20)
CHLORIDE: 102 mmol/L (ref 101–111)
CO2: 27 mmol/L (ref 22–32)
Calcium: 8.3 mg/dL — ABNORMAL LOW (ref 8.9–10.3)
Creatinine, Ser: 0.65 mg/dL (ref 0.61–1.24)
GFR calc Af Amer: >60 mL/min (ref >60)
GFR calc non Af Amer: >60 mL/min (ref >60)
Glucose, Bld: 102 mg/dL — ABNORMAL HIGH (ref 65–99)
POTASSIUM: 3.5 mmol/L (ref 3.5–5.1)
SODIUM: 136 mmol/L (ref 135–145)

## 2015-10-23 LAB — CBC WITH DIFFERENTIAL/PLATELET
Basophils Absolute: 0 10*3/uL (ref 0–0.1)
Basophils Relative: 1 %
EOS PCT: 1 %
Eosinophils Absolute: 0.1 10*3/uL (ref 0–0.7)
HCT: 34.5 % — ABNORMAL LOW (ref 40.0–52.0)
Hemoglobin: 11.7 g/dL — ABNORMAL LOW (ref 13.0–18.0)
LYMPHS ABS: 0.8 10*3/uL — AB (ref 1.0–3.6)
LYMPHS PCT: 12 %
MCH: 29.6 pg (ref 26.0–34.0)
MCHC: 33.9 g/dL (ref 32.0–36.0)
MCV: 87.4 fL (ref 80.0–100.0)
MONOS PCT: 13 %
Monocytes Absolute: 0.9 10*3/uL (ref 0.2–1.0)
NEUTROS PCT: 73 %
Neutro Abs: 5.1 10*3/uL (ref 1.4–6.5)
PLATELETS: 185 10*3/uL (ref 150–440)
RBC: 3.95 MIL/uL — AB (ref 4.40–5.90)
RDW: 18.3 % — ABNORMAL HIGH (ref 11.5–14.5)
WBC: 6.8 10*3/uL (ref 3.8–10.6)

## 2015-10-23 MED ORDER — ALBUTEROL SULFATE (2.5 MG/3ML) 0.083% IN NEBU
2.5000 mg | INHALATION_SOLUTION | Freq: Once | RESPIRATORY_TRACT | Status: AC
  Administered 2015-10-23: 2.5 mg via RESPIRATORY_TRACT
  Filled 2015-10-23: qty 3

## 2015-10-23 MED ORDER — PREDNISONE 20 MG PO TABS
60.0000 mg | ORAL_TABLET | Freq: Once | ORAL | Status: AC
  Administered 2015-10-23: 60 mg via ORAL
  Filled 2015-10-23: qty 3

## 2015-10-23 MED ORDER — LORAZEPAM 1 MG PO TABS
1.0000 mg | ORAL_TABLET | Freq: Once | ORAL | Status: AC
  Administered 2015-10-23: 1 mg via ORAL

## 2015-10-23 MED ORDER — LORAZEPAM 1 MG PO TABS
ORAL_TABLET | ORAL | Status: AC
  Administered 2015-10-23: 1 mg via ORAL
  Filled 2015-10-23: qty 1

## 2015-10-23 MED ORDER — LORAZEPAM 1 MG PO TABS: 1.0000 mg | ORAL_TABLET | Freq: Three times a day (TID) | ORAL | Status: DC | PRN

## 2015-10-23 MED ORDER — PREDNISONE 20 MG PO TABS: 60.0000 mg | ORAL_TABLET | Freq: Every day | ORAL | Status: AC

## 2015-10-23 MED ORDER — IPRATROPIUM-ALBUTEROL 0.5-2.5 (3) MG/3ML IN SOLN
3.0000 mL | Freq: Once | RESPIRATORY_TRACT | Status: AC
  Administered 2015-10-23: 3 mL via RESPIRATORY_TRACT
  Filled 2015-10-23: qty 3

## 2015-10-23 NOTE — ED Notes (Signed)
Pt. Indicates he was brought in by EMS.  Pt. States "I have no one that can take me home".

## 2015-10-23 NOTE — ED Provider Notes (Signed)
[facility] Emergency Department Provider Note  ____________________________________________  Time seen: Approximately 8:15 PM  I have reviewed the triage vital signs and the nursing notes.   HISTORY  Chief Complaint Shortness of Breath   HPI Timothy Ali is a 82 y.o. male history of metastatic lung cancer, COPD, depression, dementia, BPH, and anxiety who presents for evaluation of shortness of breath. Patient was seen here on 10/16/15 for similar complaint after running out of ativan. According to the patient he has been on Ativan for many years. At that time he was given a prescription for a week and has run out again of his Ativan today. He reports that since last night he's been having wheezing and worsening shortness of breath that is responsive to his albuterol at home. Patient reports that he is on day 5 of Levaquin for pneumonia diagnosed by his primary care doctor. He reports that he has had a productive cough for a week that is getting better now that he is on antibiotics. He denies orthopnea, fever, chest pain, abdominal pain, nausea, vomiting, diarrhea. He has been using his inhalers at home with improvement of the SOB. He called EMS today because the shortness of breath wasn't getting better and because he ran out of his Ativan. When EMS arrived the patient was not using his oxygen and he was placed on his normal 3 L via nasal cannula with improvement of his symptoms. Patient was satting in the high 90s after being placed on oxygen. Patient reports that he has been unable to have his prescription refilled by his psychiatrist on his last appointment. He reports that he has not asked his primary care doctor or oncologist for refill of his Ativan. He does feel that his shortness of breath is associated with lack of ativan. He denies leg pain or swelling or prior history of DVTs PEs.   Past Medical History  Diagnosis Date  . Major depressive disorder,  recurrent, severe with psychotic features ([place])   . Persistent disorder of initiating or maintaining sleep   . Generalized anxiety disorder   . Major depressive disorder, recurrent, severe with psychotic features ([place])   . Persistent disorder of initiating or maintaining sleep   . Asthma   . COPD (chronic obstructive pulmonary disease) ([place])   . Cancer [facility])     Left lung    Patient Active Problem List   Diagnosis Date Noted  . Cancer of upper lobe of left lung ([place]) 10/20/2015  . Dementia 08/31/2015  . Generalized anxiety disorder 08/31/2015  . SOB (shortness of breath) 08/19/2015  . BPH (benign prostatic hypertrophy) with urinary retention 02/16/2015  . Metastatic lung cancer (metastasis from lung to other site) ([place]) 02/16/2015  . Lung cancer ([place]) 10/24/2014  . Acute on chronic respiratory failure ([place]) 10/23/2014  . Leucocytosis 10/23/2014  . COPD (chronic obstructive pulmonary disease) ([place]) 10/23/2014  . Carcinoma, lung ([place]) 10/23/2014  . Cancer of lung ([place]) 10/23/2014  . CAFL (chronic airflow limitation) ([employer]) 08/01/2014  . Depression, major, recurrent, severe with psychosis ([place]) 08/01/2014  . H/O gastrointestinal disease 08/01/2014  . Anxiety, generalized 08/01/2014  . H/O elevated lipids 08/01/2014  . Insomnia, persistent 08/01/2014  . H/O malignant neoplasm 08/01/2014  . Prostate disease 08/01/2014  . Macular degeneration 08/01/2014  . Obstruction of urinary tract 08/01/2014  . Chronic obstructive pulmonary disease ([place]) 08/01/2014  . Severe episode of recurrent major depressive disorder, with psychotic features ([place]) 08/01/2014    Past Surgical History  Procedure Laterality Date  . Hemorroidectomy    . Lung surgery      Current Outpatient Rx  Name  Route  Sig  Dispense  Refill  . albuterol (PROVENTIL HFA;VENTOLIN HFA) 108 (90 BASE) MCG/ACT inhaler   Inhalation   Inhale 2 puffs into the lungs every 4 (four) hours as needed for wheezing or shortness of  breath.          Marland Kitchen atorvastatin (LIPITOR) 40 MG tablet   Oral   Take 40 mg by mouth at bedtime.          Marland Kitchen BREO ELLIPTA 100-25 MCG/INH AEPB   Inhalation   Inhale 1 puff into the lungs daily.            Dispense as written.   Marland Kitchen esomeprazole (NEXIUM) 40 MG capsule   Oral   Take 40 mg by mouth daily.          Marland Kitchen guaiFENesin (MUCINEX) 600 MG 12 hr tablet   Oral   Take 1 tablet (600 mg total) by mouth 2 (two) times daily.   20 tablet   0   . ipratropium-albuterol (DUONEB) 0.5-2.5 (3) MG/3ML SOLN   Nebulization   Take 3 mLs by nebulization every 6 (six) hours as needed. Patient not taking: Reported on 10/19/2015   360 mL   1   . levofloxacin (LEVAQUIN) 500 MG tablet   Oral   Take 1 tablet (500 mg total) by mouth daily.   14 tablet   0   . LORazepam (ATIVAN) 1 MG tablet   Oral   Take 1 tablet (1 mg total) by mouth 3 (three) times daily as needed for anxiety.   9 tablet   0   . ondansetron (ZOFRAN) 8 MG tablet   Oral   Take 8 mg by mouth every 8 (eight) hours as needed for nausea. Reported on 10/19/2015         . potassium chloride SA (K-DUR,KLOR-CON) 20 MEQ tablet   Oral   Take 1 tablet (20 mEq total) by mouth daily.   30 tablet   3   . predniSONE (DELTASONE) 10 MG tablet   Oral   Take 1 tablet (10 mg total) by mouth daily with breakfast.   30 tablet   2   . predniSONE (DELTASONE) 20 MG tablet   Oral   Take 3 tablets (60 mg total) by mouth daily.   12 tablet   0   . QUEtiapine (SEROQUEL XR) 300 MG 24 hr tablet   Oral   Take 1 tablet (300 mg total) by mouth at bedtime.   90 tablet   1     HOLD FILLING UNTIL PATIENT CALLS FOR REFILL.   Marland Kitchen senna-docusate (SENOKOT-S) 8.6-50 MG tablet   Oral   Take 1 tablet by mouth 2 (two) times daily.   30 tablet   0   . tamsulosin (FLOMAX) 0.4 MG CAPS capsule   Oral   Take 0.4 mg by mouth daily after breakfast.          . tiotropium (SPIRIVA) 18 MCG inhalation capsule   Inhalation   Place 18 mcg into  inhaler and inhale daily.          Marland Kitchen venlafaxine XR (EFFEXOR-XR) 150 MG 24 hr capsule   Oral   Take 1 capsule (150 mg total) by mouth daily.   90 capsule   1     HOLD FILLING UNTIL PATIENT CALLS FOR REFILL.   Marland Kitchen  zolpidem (AMBIEN) 10 MG tablet   Oral   Take 1 tablet by mouth at bedtime.           Allergies Penicillins  Family History  Problem Relation Age of Onset  . COPD Mother   . Lung cancer Father   . Kidney disease Neg Hx   . Prostate cancer Neg Hx     Social History Social History  Substance Use Topics  . Smoking status: Former Research scientist (life sciences)  . Smokeless tobacco: Never Used     Comment: quit 18 years ago  . Alcohol Use: No    Review of Systems Constitutional: Negative for fever. Eyes: Negative for visual changes. ENT: Negative for sore throat. Cardiovascular: Negative for chest pain. Respiratory: + shortness of breath. Gastrointestinal: Negative for abdominal pain, vomiting or diarrhea. Genitourinary: Negative for dysuria. Musculoskeletal: Negative for back pain. Skin: Negative for rash. Neurological: Negative for headaches, weakness or numbness.  ____________________________________________   PHYSICAL EXAM:  VITAL SIGNS: ED Triage Vitals  Enc Vitals Group     BP 10/23/15 1837 144/71 mmHg     Pulse Rate 10/23/15 1837 99     Resp 10/23/15 1837 16     Temp 10/23/15 1837 97.7 F (36.5 C)     Temp Source 10/23/15 1837 Oral     SpO2 10/23/15 1837 94 %     Weight 10/23/15 1837 153 lb 3.5 oz (69.5 kg)     Height 10/23/15 1837 '5\' 7"'$  (1.702 m)     Head Cir --      Peak Flow --      Pain Score --      Pain Loc --      Pain Edu? --      Excl. in [place]? --     Constitutional: Alert and oriented. Well appearing and in no apparent distress. HEENT:      Head: Normocephalic and atraumatic.         Eyes: Conjunctivae are normal. Sclera is non-icteric. EOMI. PERRL      Mouth/Throat: Mucous membranes are moist.       Neck: Supple with no signs of  meningismus. Cardiovascular: Regular rate and rhythm. No murmurs, gallops, or rubs. 2+ symmetrical distal pulses are present in all extremities. No JVD. Respiratory: Normal respiratory effort. Satting 94% on 3 L nasal cannula. Faint expiratory wheezes on the anterior apex of his lungs, no crackles Gastrointestinal: Soft, non tender, and non distended with positive bowel sounds. No rebound or guarding. Musculoskeletal: Nontender with normal range of motion in all extremities. No edema, cyanosis, or erythema of extremities. Neurologic: Normal speech and language. Face is symmetric. Moving all extremities. No gross focal neurologic deficits are appreciated. Skin: Skin is warm, dry and intact. No rash noted. Psychiatric: Mood and affect are normal. Speech and behavior are normal.  ____________________________________________   LABS (all labs ordered are listed, but only abnormal results are displayed)  Labs Reviewed  BASIC METABOLIC PANEL - Abnormal; Notable for the following:    Glucose, Bld 102 (*)    Calcium 8.3 (*)    All other components within normal limits  CBC WITH DIFFERENTIAL/PLATELET - Abnormal; Notable for the following:    RBC 3.95 (*)    Hemoglobin 11.7 (*)    HCT 34.5 (*)    RDW 18.3 (*)    Lymphs Abs 0.8 (*)    All other components within normal limits   ____________________________________________  EKG  ED ECG REPORT I, Rudene Re, the attending physician, personally viewed  and interpreted this ECG.  Sinus rhythm, rate of 93, normal intervals, right axis deviation, no ST elevations or depressions. Unchanged from prior ____________________________________________  RADIOLOGY  CXR: no acute findings  ____________________________________________   PROCEDURES  Procedure(s) performed: None Critical Care performed:  None ____________________________________________   INITIAL IMPRESSION / ASSESSMENT AND PLAN / ED COURSE   82 y.o. male history of  metastatic lung cancer, COPD, depression, dementia, BPH, and anxiety who presents for evaluation of shortness of breath and wheezing in the setting of a week of URI symptoms, productive cough. Patient has been started on Levaquin and reports that his symptoms are improving. He ran out of his prescription for Ativan today. He has been unable to have this prescription refilled by his primary providers. Patient is on this medication for more than 20 years. He is breathing comfortably, has normal vital signs, satting well on 3 L nasal cannula which is his baseline, does have some scattered faint expiratory wheezes mostly on the apices of bilateral lungs. Remainder of his exam is reassuring. We'll give him a breathing treatment, and give him a steroid burst for mild COPD flair. We'll check a chest x-ray and basic labs. Since today is Friday and patient is unable to see one of his doctors over the weekend will provide him with a small prescription of Ativan to last him the weekend as I am afraid patient might withdrawal and have a seizure if he suddenly stops this medication that his been on for many years. I have told the patient that we are unable to provide him any more prescriptions for Ativan and he needs to get this from his primary care doctor. Patient understands that.  ----------------------------------------- 8:36 PM on 10/23/2015 -----------------------------------------  Patient feels back to his baseline after one DuoNeb treatment. No longer wheezing. He satting 94% on 3 L nasal cannula, normal work of breathing. Chest x-ray with no infiltrate. EKG and labs within baseline. We'll discharge patient home to continue his course of Levaquin, increase his prednisone to 60 mg for the next 4 days and then instructed patient to resume his 10 mg of the prednisone. Patient has his Ventolin at home and was encouraged to use 2 puffs every 4 hours as needed for wheezing. Patient follow-up with his doctor  Monday.  Pertinent labs & imaging results that were available during my care of the patient were reviewed by me and considered in my medical decision making (see chart for details).    ____________________________________________   FINAL CLINICAL IMPRESSION(S) / ED DIAGNOSES  Final diagnoses:  COPD exacerbation ([place])      NEW MEDICATIONS STARTED DURING THIS VISIT:  New Prescriptions   LORAZEPAM (ATIVAN) 1 MG TABLET    Take 1 tablet (1 mg total) by mouth 3 (three) times daily as needed for anxiety.   PREDNISONE (DELTASONE) 20 MG TABLET    Take 3 tablets (60 mg total) by mouth daily.     Note:  This document was prepared using Dragon voice recognition software and may include unintentional dictation errors.    Rudene Re, MD 10/23/15 2037

## 2015-10-23 NOTE — ED Notes (Signed)
Pt came to ED via EMS. Pt has lung cancer, reports ran out of Ativan prescription. Pt also reports sob. Per EMS, pt has not been wearing his home 02. Normally on 3L. Placed on 3L via EMS and pt reports feeling better.

## 2015-10-23 NOTE — ED Notes (Signed)
Pt. Going home with friend 

## 2015-10-23 NOTE — Discharge Instructions (Signed)
You were seen today in the Emergency Department (ED) and was diagnosed with a COPD exacerbation. Chronic obstructive pulmonary disease (COPD) is a general term for a group of lung diseases, including emphysema and chronic bronchitis. People with COPD have decreased airflow in and out of the lungs, which makes it hard to breathe. The airways also can get clogged with thick mucus. Cigarette smoking is a major cause of COPD.   Although there is no cure for COPD, you can slow its progress. Following your treatment plan and taking care of yourself can help you feel better and live longer.   Use your albuterol inhaler 2 puffs every 4 hour as needed for shortness of breath, wheezing, or cough. Increase your prednisone to 60 mg a day for the next 4 days and then resume your 10 mg a day. Continue Levaquin as prescribed.   Follow-up with your doctor in 1 day for re-evaluation.   When should you call for help?  Call 911 anytime you think you may need emergency care. For example, call if:  You have severe trouble breathing.  You have severe chest pain. Call your doctor now or seek immediate medical care if:  You have new or worse shortness of breath.  You develop new chest pain.  You are coughing more deeply or more often, especially if you notice more mucus or a change in the color of your mucus.  You cough up blood.  You have new or increased swelling in your legs or belly.  You have a fever. Watch closely for changes in your health, and be sure to contact your doctor if:  You use your antibiotic prescription.  Your symptoms are getting worse   How can you care for yourself at home?   During an exacerbation  Do not panic if you start to have one. Quick treatment at home may help you prevent serious breathing problems. If you have a COPD exacerbation plan that you developed with your doctor, follow it.  Take your medicines exactly as your doctor tells you.  Use your inhaler as directed by your  doctor. If your symptoms do not get better after you use your medicine, have someone take you to the emergency room. Call an ambulance if necessary.  With inhaled medicines, a spacer or a nebulizer may help you get more medicine to your lungs. Ask your doctor or pharmacist how to use them properly. Practice using the spacer in front of a mirror before you have an exacerbation. This may help you get the medicine into your lungs quickly.  If your doctor has given you steroid pills, take them as directed.  Your doctor may have given you a prescription for antibiotics, which you are to fill if you need it. Call your doctor if you use the prescription.  Talk to your doctor if you have any problems with your medicine.  Preventing an exacerbation  Do not smoke. This is the most important step you can take to prevent more damage to your lungs and prevent problems. If you already smoke, it is never too late to stop. If you need help quitting, talk to your doctor about stop-smoking programs and medicines. These can increase your chances of quitting for good.  Take your daily medicines as prescribed.  Avoid colds and flu.  Get a pneumococcal vaccine.  Get a flu vaccine each year, as soon as it is available. Ask those you live or work with to do the same, so they will not  get the flu and infect you.  Try to stay away from people with colds or the flu.  Wash your hands often. Avoid secondhand smoke; air pollution; cold, dry air; hot, humid air; and high altitudes. Stay at home with your windows closed when air pollution is bad.  Learn breathing techniques for COPD, such as breathing through pursed lips. These techniques can help you breathe easier during an exacerbation.  Staying healthy  Do not smoke. This is the most important step you can take to prevent more damage to your lungs. If you need help quitting, talk to your doctor about stop-smoking programs and medicines. These can increase your chances of  quitting for good.  Avoid colds and flu. Get a pneumococcal vaccine shot. If you have had one before, ask your doctor whether you need a second dose. Get the flu vaccine every fall. If you must be around people with colds or the flu, wash your hands often.  Avoid secondhand smoke, air pollution, and high altitudes. Also avoid cold, dry air and hot, humid air. Stay at home with your windows closed when air pollution is bad.  Medicines and oxygen therapy  Take your medicines exactly as prescribed. Call your doctor if you think you are having a problem with your medicine.  You may be taking medicines such as:  Bronchodilators. These help open your airways and make breathing easier. Bronchodilators are either short-acting (work for 6 to 9 hours) or long-acting (work for 24 hours). You inhale most bronchodilators, so they start to act quickly. Always carry your quick-relief inhaler with you in case you need it while you are away from home.  Corticosteroids (prednisone, budesonide). These reduce airway inflammation. They come in pill or inhaled form. You must take these medicines every day for them to work well. A spacer may help you get more inhaled medicine to your lungs. Ask your doctor or pharmacist if a spacer is right for you. If it is, ask how to use it properly.  Do not take any vitamins, over-the-counter medicine, or herbal products without talking to your doctor first.  If your doctor prescribed antibiotics, take them as directed. Do not stop taking them just because you feel better. You need to take the full course of antibiotics.  Oxygen therapy boosts the amount of oxygen in your blood and helps you breathe easier. Use the flow rate your doctor has recommended, and do not change it without talking to your doctor first.  Activity  Get regular exercise. Walking is an easy way to get exercise. Start out slowly, and walk a little more each day.  Pay attention to your breathing. You are exercising  too hard if you cannot talk while you are exercising.  Take short rest breaks when doing household chores and other activities.  Learn breathing methods--such as breathing through pursed lips--to help you become less short of breath.  If your doctor has not set you up with a pulmonary rehabilitation program, talk to him or her about whether rehab is right for you. Rehab includes exercise programs, education about your disease and how to manage it, help with diet and other changes, and emotional support.  Diet  Eat regular, healthy meals. Use bronchodilators about 1 hour before you eat to make it easier to eat. Eat several small meals instead of three large ones. Drink beverages at the end of the meal. Avoid foods that are hard to chew.  Eat foods that contain fat and protein so that you  do not lose weight and muscle mass. These foods include ice cream, pudding, cheese, eggs, and peanut butter.  Use less salt. Too much salt can cause you to retain fluids, which makes it harder to breathe. Do not add salt while you are cooking or at the table. Eat fewer processed foods and foods from restaurants, including fast foods. Use fresh or frozen foods instead of canned foods.  Mental health  Talk to your family, friends, or a therapist about your feelings. It is normal to feel frightened, angry, hopeless, helpless, and even guilty. Talking openly about bad feelings can help you cope. If these feelings last, talk to your doctor.

## 2015-10-27 ENCOUNTER — Emergency Department
Admission: EM | Admit: 2015-10-27 | Discharge: 2015-10-27 | Disposition: A | Discharge status: Home / Self Care | Payer: Medicare HMO | Attending: Emergency Medicine | Admitting: Emergency Medicine

## 2015-10-27 ENCOUNTER — Emergency Department: Payer: Medicare HMO

## 2015-10-27 ENCOUNTER — Encounter: Payer: Self-pay | Admitting: Emergency Medicine

## 2015-10-27 DIAGNOSIS — R0609 Other forms of dyspnea: Secondary | ICD-10-CM

## 2015-10-27 DIAGNOSIS — F333 Major depressive disorder, recurrent, severe with psychotic symptoms: Secondary | ICD-10-CM | POA: Insufficient documentation

## 2015-10-27 DIAGNOSIS — Z87891 Personal history of nicotine dependence: Secondary | ICD-10-CM | POA: Diagnosis not present

## 2015-10-27 DIAGNOSIS — Z7951 Long term (current) use of inhaled steroids: Secondary | ICD-10-CM | POA: Diagnosis not present

## 2015-10-27 DIAGNOSIS — R0600 Dyspnea, unspecified: Secondary | ICD-10-CM | POA: Diagnosis not present

## 2015-10-27 DIAGNOSIS — J45909 Unspecified asthma, uncomplicated: Secondary | ICD-10-CM | POA: Diagnosis not present

## 2015-10-27 DIAGNOSIS — J449 Chronic obstructive pulmonary disease, unspecified: Secondary | ICD-10-CM | POA: Diagnosis not present

## 2015-10-27 DIAGNOSIS — Z85118 Personal history of other malignant neoplasm of bronchus and lung: Secondary | ICD-10-CM | POA: Insufficient documentation

## 2015-10-27 DIAGNOSIS — Z79899 Other long term (current) drug therapy: Secondary | ICD-10-CM | POA: Diagnosis not present

## 2015-10-27 DIAGNOSIS — R0602 Shortness of breath: Secondary | ICD-10-CM | POA: Diagnosis present

## 2015-10-27 DIAGNOSIS — F419 Anxiety disorder, unspecified: Secondary | ICD-10-CM | POA: Diagnosis not present

## 2015-10-27 DIAGNOSIS — F064 Anxiety disorder due to known physiological condition: Secondary | ICD-10-CM

## 2015-10-27 MED ORDER — LORAZEPAM 1 MG PO TABS: 1.0000 mg | ORAL_TABLET | Freq: Three times a day (TID) | ORAL | Status: DC | PRN

## 2015-10-27 MED ORDER — IPRATROPIUM-ALBUTEROL 0.5-2.5 (3) MG/3ML IN SOLN
3.0000 mL | Freq: Once | RESPIRATORY_TRACT | Status: AC
  Administered 2015-10-27: 3 mL via RESPIRATORY_TRACT
  Filled 2015-10-27: qty 3

## 2015-10-27 MED ORDER — LORAZEPAM 1 MG PO TABS
1.0000 mg | ORAL_TABLET | Freq: Once | ORAL | Status: AC
  Administered 2015-10-27: 1 mg via ORAL
  Filled 2015-10-27: qty 1

## 2015-10-27 NOTE — ED Provider Notes (Signed)
[facility] Emergency Department Provider Note  ____________________________________________  Time seen: Approximately 11:09 AM  I have reviewed the triage vital signs and the nursing notes.   HISTORY  Chief Complaint Shortness of Breath and Medication Refill    HPI Timothy Ali is a 82 y.o. male presents for evaluation of a medication refill.  Patient notes to me that, along with his sister Timothy Ali, and he has been on Ativan for 50 years, taking 1 mg 3 times a day. He recently lost his psychiatrist, and his primary care physician has not been willing to refill his Ativan. He reports that he has had to come to the emergency room a couple of times to have it refilled, but he is up and able to get a long enough prescription to make it to his next scheduled psychiatry evaluation which is the 21st of this month. He reports to me along with his sister that he is here primarily because he needs a refill of his Ativan, 1 mg 3 times a day.  In addition, the patient does sell me that he's had shortness of breath. He reports that this is been ongoing process that ongoing for months, and he wears 3 L of oxygen at home all the time. He has inhalers, and has 1 day worth of Levaquin as his oncologist but he could have pneumonia though they were not sure. He is also been on prednisone and has a few more days of this.  Overall he reports that his shortness of breath has remained stable for the last couple of weeks, but whenever he is up and moving he does became winded. He snacks printing chest pain. No leg swelling. No fevers or chills. No productive cough.   Past Medical History  Diagnosis Date  . Major depressive disorder, recurrent, severe with psychotic features ([place])   . Persistent disorder of initiating or maintaining sleep   . Generalized anxiety disorder   . Major depressive disorder, recurrent, severe with psychotic features ([place])   . Persistent disorder of  initiating or maintaining sleep   . Asthma   . COPD (chronic obstructive pulmonary disease) ([place])   . Cancer [facility])     Left lung    Patient Active Problem List   Diagnosis Date Noted  . Cancer of upper lobe of left lung ([place]) 10/20/2015  . Dementia 08/31/2015  . Generalized anxiety disorder 08/31/2015  . SOB (shortness of breath) 08/19/2015  . BPH (benign prostatic hypertrophy) with urinary retention 02/16/2015  . Metastatic lung cancer (metastasis from lung to other site) ([place]) 02/16/2015  . Lung cancer ([place]) 10/24/2014  . Acute on chronic respiratory failure ([place]) 10/23/2014  . Leucocytosis 10/23/2014  . COPD (chronic obstructive pulmonary disease) ([place]) 10/23/2014  . Carcinoma, lung ([place]) 10/23/2014  . Cancer of lung ([place]) 10/23/2014  . CAFL (chronic airflow limitation) ([place]) 08/01/2014  . Depression, major, recurrent, severe with psychosis ([place]) 08/01/2014  . H/O gastrointestinal disease 08/01/2014  . Anxiety, generalized 08/01/2014  . H/O elevated lipids 08/01/2014  . Insomnia, persistent 08/01/2014  . H/O malignant neoplasm 08/01/2014  . Prostate disease 08/01/2014  . Macular degeneration 08/01/2014  . Obstruction of urinary tract 08/01/2014  . Chronic obstructive pulmonary disease ([place]) 08/01/2014  . Severe episode of recurrent major depressive disorder, with psychotic features ([place]) 08/01/2014    Past Surgical History  Procedure Laterality Date  . Hemorroidectomy    . Lung surgery      Current Outpatient Rx  Name  Route  Sig  Dispense  Refill  . albuterol (PROVENTIL HFA;VENTOLIN HFA) 108 (90 BASE) MCG/ACT inhaler   Inhalation   Inhale 2 puffs into the lungs every 4 (four) hours as needed for wheezing or shortness of breath.          Marland Kitchen atorvastatin (LIPITOR) 40 MG tablet   Oral   Take 40 mg by mouth at bedtime.          Marland Kitchen BREO ELLIPTA 100-25 MCG/INH AEPB   Inhalation   Inhale 1 puff into the lungs daily.            Dispense as written.   Marland Kitchen  esomeprazole (NEXIUM) 40 MG capsule   Oral   Take 40 mg by mouth daily.          Marland Kitchen guaiFENesin (MUCINEX) 600 MG 12 hr tablet   Oral   Take 1 tablet (600 mg total) by mouth 2 (two) times daily.   20 tablet   0   . levofloxacin (LEVAQUIN) 500 MG tablet   Oral   Take 1 tablet (500 mg total) by mouth daily.   14 tablet   0   . ondansetron (ZOFRAN) 8 MG tablet   Oral   Take 8 mg by mouth every 8 (eight) hours as needed for nausea. Reported on 10/19/2015         . predniSONE (DELTASONE) 20 MG tablet   Oral   Take 3 tablets (60 mg total) by mouth daily. Patient taking differently: Take 60 mg by mouth daily. (last dose of '60mg'$  due 7/11)   12 tablet   0   . tamsulosin (FLOMAX) 0.4 MG CAPS capsule   Oral   Take 0.4 mg by mouth daily after breakfast.          . tiotropium (SPIRIVA) 18 MCG inhalation capsule   Inhalation   Place 18 mcg into inhaler and inhale daily.          Marland Kitchen venlafaxine XR (EFFEXOR-XR) 150 MG 24 hr capsule   Oral   Take 1 capsule (150 mg total) by mouth daily.   90 capsule   1     HOLD FILLING UNTIL PATIENT CALLS FOR REFILL.   Marland Kitchen LORazepam (ATIVAN) 1 MG tablet   Oral   Take 1 tablet (1 mg total) by mouth every 8 (eight) hours as needed for anxiety.   15 tablet   0     Allergies Penicillins  Family History  Problem Relation Age of Onset  . COPD Mother   . Lung cancer Father   . Kidney disease Neg Hx   . Prostate cancer Neg Hx     Social History Social History  Substance Use Topics  . Smoking status: Former Research scientist (life sciences)  . Smokeless tobacco: Never Used     Comment: quit 18 years ago  . Alcohol Use: No    Review of Systems Constitutional: No fever/chills Eyes: No visual changes. ENT: No sore throat. Cardiovascular: Denies chest pain. Respiratory: See history of present illness Gastrointestinal: No abdominal pain.  No nausea, no vomiting.  No diarrhea.  No constipation. Genitourinary: Negative for dysuria. Musculoskeletal: Negative for  back pain. Skin: Negative for rash. Neurological: Negative for headaches, focal weakness or numbness.  Denies any hallucinations, self harming thoughts, paranoia or other concerns. He does report he has general anxiety that has been persistent for over 20 years.  10-point ROS otherwise negative.  ____________________________________________   PHYSICAL EXAM:  VITAL SIGNS: ED Triage Vitals  Enc Vitals  Group     BP 10/27/15 1027 124/82 mmHg     Pulse Rate 10/27/15 1027 92     Resp 10/27/15 1027 22     Temp 10/27/15 1027 97.4 F (36.3 C)     Temp Source 10/27/15 1027 Oral     SpO2 10/27/15 1027 92 %     Weight --      Height --      Head Cir --      Peak Flow --      Pain Score 10/27/15 1040 9     Pain Loc --      Pain Edu? --      Excl. in [place]? --    Constitutional: Alert and oriented. Well appearing and in no acute distress. Eyes: Conjunctivae are normal. PERRL. EOMI. Head: Atraumatic. Nose: No congestion/rhinnorhea. Mouth/Throat: Mucous membranes are moist.  Oropharynx non-erythematous. Neck: No stridor.   Cardiovascular: Normal rate, regular rhythm. Grossly normal heart sounds.  Good peripheral circulation. Respiratory: Mild increased work of breathing, slight tachypnea, clear lung sounds bilaterally though diminished . The patient speaks in short sentences. His oxygen saturation is 95% on 3 L, he reports this is normal for him.   As we discussed the patient's breathing, he and his sister report that this is the way he is in breathing for months, and that he has not had a persistent or clear worsening of his symptoms. He always has shortness of breath for the last several months.  Gastrointestinal: Soft and nontender. No distention. No abdominal bruits. No CVA tenderness. Musculoskeletal: No lower extremity tenderness nor edema.  No joint effusions. Neurologic:  Normal speech and language. No gross focal neurologic deficits are appreciated. No gait instability. Skin:   Skin is warm, dry and intact. No rash noted. Psychiatric: Mood and affect are normal. Speech and behavior are normal.  ____________________________________________   LABS (all labs ordered are listed, but only abnormal results are displayed)  Labs Reviewed - No data to display ____________________________________________  EKG  Reviewed and interpreted by me at 10:40 AM Sinus tachycardia 100 PR 120 QTc 4:30 Moderate artifact, normal sinus rhythm, likely some chronic pulmonary pattern is seen with right atrial enlargement noted, right axis deviation noted, no evidence of acute ischemic abnormality noted. Compared with previous EKG, slight increase in rate noted, otherwise no new T-wave abnormalities or ischemic change. ____________________________________________  RADIOLOGY  DG Chest 2 View (Final result) Result time: 10/27/15 12:07:34   Final result by Rad Results In Interface (10/27/15 12:07:34)   Narrative:   CLINICAL DATA: Shortness of Breath. History of lung carcinoma  EXAM: CHEST 2 VIEW  COMPARISON: October 23, 2015 chest radiograph and chest CT October 15, 2015  FINDINGS: Lungs are hyperexpanded with scarring in the bases. There is also scarring in the left perihilar region. Ill-defined opacity medial left apex is better appreciated on recent CT. New opacity is evident.  Heart size is normal. Pulmonary vascularity reflects a degree of underlying emphysematous change, stable. There is atherosclerotic calcification in the aorta. No bone lesions are evident. No adenopathy is evident by radiography.  IMPRESSION: Areas of scarring and underlying emphysematous change, stable. Scarring in the left perihilar region is stable. Ill-defined opacity in the medial left apex region is better seen on recent CT and shows no progression on the the current examination. Cardiac silhouette stable. There is aortic atherosclerosis.   Electronically Signed By: Lowella Grip  III M.D. On: 10/27/2015 12:07     Visit we discussed choice of  imaging study today, the patient moist that he has had many many CT scans does not wish for one today. In reviewing his records this appears true, and is followed closely by oncology with serial imaging studies. He also denies ever having a history of any blood clots or pulmonary embolism. I will obtain an x-ray to evaluate for any evidence of infiltrative process, though overall my clinical gestalt is that this patient suffers from chronic dyspnea and is respiratory symptoms seem to be at his chronic baseline. ____________________________________________   PROCEDURES  Procedure(s) performed: None  Critical Care performed: No  ____________________________________________   INITIAL IMPRESSION / ASSESSMENT AND PLAN / ED COURSE  Pertinent labs & imaging results that were available during my care of the patient were reviewed by me and considered in my medical decision making (see chart for details).  Patient presents for refill of his Ativan. We discussed this in detail, also discussed the risks of it including confusion, dementia, and addiction. The patient reports that he does need this, he once had withdrawals and suffered a withdrawal seizure many years ago. He just ran out of his Ativan today, and in discussing with him I think it is not unreasonable to refill it in order to avoid withdrawals for this elderly gentleman who is been on treatment for what he reports as many years. He and his sister are working with his Education officer, museum as well as have set up a psychiatry appointment for the 21st. He is following appropriately for oncology.  After discussing the risks and benefits of Ativan, and an important need for close follow-up the patient is amenable to close follow-up plan, and I discussed that I could provide him with 5 days of his previously prescribed dose of lorazepam.  Regarding his dyspnea, he denies any chest pain,  there is no evidence of cardiac disease. He is afebrile, and he appears to be at his respiratory baseline. I will give him a DuoNeb to see if he received any relief, though sounds as though due to tumor burden and a persistent history of chronic lung disease that he is likely at his respiratory baseline. I anticipate likely discharge to home after reviewing chest x-ray, and giving the patient a nebulizer/Ativan in further evaluating thereafter.  ----------------------------------------- 1:45 PM on 10/27/2015 -----------------------------------------  Patient reports he feels fine. He has no concern at this time. His sister will be driving him home, and I did provide him a very brief prescription of lorazepam and discussed common side effects with him. He is very accepting of this, and will be following up closely with his doctor and psychiatrist for whom he now has a scheduled appointment coming up in about 10 days.  Return precautions and treatment recommendations and follow-up discussed with the patient who is agreeable with the plan.  ____________________________________________   FINAL CLINICAL IMPRESSION(S) / ED DIAGNOSES  Final diagnoses:  Anxiety disorder due to general medical condition  Exertional dyspnea      Delman Kitten, MD 10/27/15 1352

## 2015-10-27 NOTE — Discharge Instructions (Signed)
No driving when taking ativan. Use only as prescribed.  Call today to setup follow-up appointments with your primary doctor, oncology (as planned Monday), and psychiatry.  Please take the prescribed medications and any medications that you have at home for your COPD.  Follow up with your doctor as recommended.  If you develop any new or worsening symptoms, including but not limited to fever, persistent vomiting, worsening shortness of breath, or other symptoms that concern you, please return to the Emergency Department immediately.

## 2015-10-27 NOTE — ED Notes (Signed)
Pt presents with shortness of breath, pt currently on home O2 at 3 liters. Pt with hx of lung CA. Also need refill on his ativan.

## 2015-10-29 ENCOUNTER — Emergency Department: Payer: Medicare HMO

## 2015-10-29 ENCOUNTER — Emergency Department
Admission: EM | Admit: 2015-10-29 | Discharge: 2015-10-29 | Disposition: A | Discharge status: Home / Self Care | Payer: Medicare HMO | Source: Home / Self Care | Attending: Emergency Medicine | Admitting: Emergency Medicine

## 2015-10-29 ENCOUNTER — Other Ambulatory Visit: Payer: Self-pay | Admitting: *Deleted

## 2015-10-29 ENCOUNTER — Encounter: Payer: Self-pay | Admitting: Emergency Medicine

## 2015-10-29 DIAGNOSIS — J449 Chronic obstructive pulmonary disease, unspecified: Secondary | ICD-10-CM

## 2015-10-29 DIAGNOSIS — J45909 Unspecified asthma, uncomplicated: Secondary | ICD-10-CM

## 2015-10-29 DIAGNOSIS — F419 Anxiety disorder, unspecified: Secondary | ICD-10-CM

## 2015-10-29 DIAGNOSIS — Z85118 Personal history of other malignant neoplasm of bronchus and lung: Secondary | ICD-10-CM

## 2015-10-29 DIAGNOSIS — G479 Sleep disorder, unspecified: Secondary | ICD-10-CM | POA: Insufficient documentation

## 2015-10-29 DIAGNOSIS — R0600 Dyspnea, unspecified: Secondary | ICD-10-CM | POA: Insufficient documentation

## 2015-10-29 DIAGNOSIS — Z87891 Personal history of nicotine dependence: Secondary | ICD-10-CM

## 2015-10-29 DIAGNOSIS — F333 Major depressive disorder, recurrent, severe with psychotic symptoms: Secondary | ICD-10-CM

## 2015-10-29 DIAGNOSIS — J440 Chronic obstructive pulmonary disease with acute lower respiratory infection: Secondary | ICD-10-CM | POA: Diagnosis not present

## 2015-10-29 DIAGNOSIS — Z7951 Long term (current) use of inhaled steroids: Secondary | ICD-10-CM

## 2015-10-29 LAB — CBC WITH DIFFERENTIAL/PLATELET
BASOS ABS: 0.1 10*3/uL (ref 0–0.1)
BASOS PCT: 1 %
EOS ABS: 0 10*3/uL (ref 0–0.7)
EOS PCT: 0 %
HEMATOCRIT: 39 % — AB (ref 40.0–52.0)
Hemoglobin: 12.5 g/dL — ABNORMAL LOW (ref 13.0–18.0)
Lymphocytes Relative: 7 %
Lymphs Abs: 0.8 10*3/uL — ABNORMAL LOW (ref 1.0–3.6)
MCH: 28.4 pg (ref 26.0–34.0)
MCHC: 32.1 g/dL (ref 32.0–36.0)
MCV: 88.5 fL (ref 80.0–100.0)
MONO ABS: 1 10*3/uL (ref 0.2–1.0)
MONOS PCT: 9 %
NEUTROS ABS: 9.4 10*3/uL — AB (ref 1.4–6.5)
Neutrophils Relative %: 83 %
PLATELETS: 171 10*3/uL (ref 150–440)
RBC: 4.4 MIL/uL (ref 4.40–5.90)
RDW: 19.2 % — AB (ref 11.5–14.5)
WBC: 11.3 10*3/uL — ABNORMAL HIGH (ref 3.8–10.6)

## 2015-10-29 LAB — BASIC METABOLIC PANEL
ANION GAP: 10 (ref 5–15)
BUN: 18 mg/dL (ref 6–20)
CALCIUM: 8.5 mg/dL — AB (ref 8.9–10.3)
CO2: 27 mmol/L (ref 22–32)
CREATININE: 0.74 mg/dL (ref 0.61–1.24)
Chloride: 99 mmol/L — ABNORMAL LOW (ref 101–111)
Glucose, Bld: 100 mg/dL — ABNORMAL HIGH (ref 65–99)
Potassium: 3.9 mmol/L (ref 3.5–5.1)
SODIUM: 136 mmol/L (ref 135–145)

## 2015-10-29 LAB — TROPONIN I

## 2015-10-29 LAB — BRAIN NATRIURETIC PEPTIDE: B Natriuretic Peptide: 47 pg/mL (ref 0.0–100.0)

## 2015-10-29 MED ORDER — LORAZEPAM 1 MG PO TABS: 1.0000 mg | ORAL_TABLET | Freq: Three times a day (TID) | ORAL | Status: DC

## 2015-10-29 MED ORDER — LORAZEPAM 2 MG/ML IJ SOLN
1.0000 mg | Freq: Once | INTRAMUSCULAR | Status: AC
  Administered 2015-10-29: 1 mg via INTRAVENOUS
  Filled 2015-10-29: qty 1

## 2015-10-29 MED ORDER — MELATONIN 3 MG PO TABS: 1.0000 | ORAL_TABLET | Freq: Every day | ORAL | Status: DC

## 2015-10-29 MED ORDER — LORAZEPAM 0.5 MG PO TABS
0.5000 mg | ORAL_TABLET | Freq: Once | ORAL | Status: AC
  Administered 2015-10-29: 0.5 mg via ORAL
  Filled 2015-10-29: qty 1

## 2015-10-29 NOTE — ED Provider Notes (Addendum)
[facility] Emergency Department Provider Note        Time seen: ----------------------------------------- 1:29 PM on 10/29/2015 -----------------------------------------    I have reviewed the triage vital signs and the nursing notes.   HISTORY  Chief Complaint No chief complaint on file.    HPI Timothy Ali is a 82 y.o. male who presents to the ER for tremulousness. Patient feels anxious and jittery, states she has a history of anxiety and depression. He has not been sleeping well at night. He states he probably sleeps about 1 hour per night. Also complaining of some pain in his left side of his chest. Reportedly he has follow-up with radiation oncology scheduled for tomorrow due to previous history of lung cancer, has had multiple episodes of radiation treatment in the past.   Past Medical History  Diagnosis Date  . Major depressive disorder, recurrent, severe with psychotic features ([place])   . Persistent disorder of initiating or maintaining sleep   . Generalized anxiety disorder   . Major depressive disorder, recurrent, severe with psychotic features ([place])   . Persistent disorder of initiating or maintaining sleep   . Asthma   . COPD (chronic obstructive pulmonary disease) ([place])   . Cancer [facility])     Left lung    Patient Active Problem List   Diagnosis Date Noted  . Cancer of upper lobe of left lung ([place]) 10/20/2015  . Dementia 08/31/2015  . Generalized anxiety disorder 08/31/2015  . SOB (shortness of breath) 08/19/2015  . BPH (benign prostatic hypertrophy) with urinary retention 02/16/2015  . Metastatic lung cancer (metastasis from lung to other site) ([place]) 02/16/2015  . Lung cancer ([place]) 10/24/2014  . Acute on chronic respiratory failure ([place]) 10/23/2014  . Leucocytosis 10/23/2014  . COPD (chronic obstructive pulmonary disease) ([place]) 10/23/2014  . Carcinoma, lung ([place]) 10/23/2014  . Cancer of lung ([place]) 10/23/2014  . CAFL (chronic  airflow limitation) ([place]) 08/01/2014  . Depression, major, recurrent, severe with psychosis ([place]) 08/01/2014  . H/O gastrointestinal disease 08/01/2014  . Anxiety, generalized 08/01/2014  . H/O elevated lipids 08/01/2014  . Insomnia, persistent 08/01/2014  . H/O malignant neoplasm 08/01/2014  . Prostate disease 08/01/2014  . Macular degeneration 08/01/2014  . Obstruction of urinary tract 08/01/2014  . Chronic obstructive pulmonary disease ([place]) 08/01/2014  . Severe episode of recurrent major depressive disorder, with psychotic features ([place]) 08/01/2014    Past Surgical History  Procedure Laterality Date  . Hemorroidectomy    . Lung surgery      Allergies Penicillins  Social History Social History  Substance Use Topics  . Smoking status: Former Research scientist (life sciences)  . Smokeless tobacco: Never Used     Comment: quit 18 years ago  . Alcohol Use: No    Review of Systems Constitutional: Negative for fever. Cardiovascular: Positive for chest pain Respiratory: Positive for shortness of breath Gastrointestinal: Negative for abdominal pain, vomiting and diarrhea. Genitourinary: Negative for dysuria. Musculoskeletal: Negative for back pain. Skin: Negative for rash. Neurological: Negative for headaches, focal weakness or numbness. Psychiatric: Positive for anxiety  10-point ROS otherwise negative.  ____________________________________________   PHYSICAL EXAM:  VITAL SIGNS: ED Triage Vitals  Enc Vitals Group     BP --      Pulse --      Resp --      Temp --      Temp src --      SpO2 --      Weight --      Height --  Head Cir --      Peak Flow --      Pain Score --      Pain Loc --      Pain Edu? --      Excl. in [place]? --     Constitutional: AlertAnd anxious, mild distress Eyes: Conjunctivae are normal. PERRL. Normal extraocular movements. ENT   Head: Normocephalic and atraumatic.   Nose: No congestion/rhinnorhea.   Mouth/Throat: Mucous membranes are  moist.   Neck: No stridor. Cardiovascular: Rapid rate, regular rhythm. No murmurs, rubs, or gallops. Respiratory: Tachypnea with mostly clear breath sounds bilaterally Gastrointestinal: Soft and nontender. Normal bowel sounds Musculoskeletal: Nontender with normal range of motion in all extremities. No lower extremity tenderness nor edema. Neurologic:  Normal speech and language. No gross focal neurologic deficits are appreciated.  Skin:  Skin is warm, dry and intact. No rash noted. Psychiatric: Anxious mood and affect ____________________________________________  EKG: Interpreted by me. Sinus tachycardia with a rate of 114 bpm, extensive baseline artifact due to tremor, normal axis, no evidence of acute infarction  ____________________________________________  ED COURSE:  Pertinent labs & imaging results that were available during my care of the patient were reviewed by me and considered in my medical decision making (see chart for details). Patient presents to ER with dyspnea and tremulousness. He appears to be having an anxiety attack. I will give IV Ativan check basic labs and imaging. ____________________________________________    LABS (pertinent positives/negatives)  Labs Reviewed  CBC WITH DIFFERENTIAL/PLATELET - Abnormal; Notable for the following:    WBC 11.3 (*)    Hemoglobin 12.5 (*)    HCT 39.0 (*)    RDW 19.2 (*)    Neutro Abs 9.4 (*)    Lymphs Abs 0.8 (*)    All other components within normal limits  BASIC METABOLIC PANEL - Abnormal; Notable for the following:    Chloride 99 (*)    Glucose, Bld 100 (*)    Calcium 8.5 (*)    All other components within normal limits  BRAIN NATRIURETIC PEPTIDE  TROPONIN I    RADIOLOGY Images were viewed by me  Chest x-ray IMPRESSION: No acute cardiopulmonary disease. Stable changes of COPD and lung scarring. ____________________________________________  FINAL ASSESSMENT AND PLAN  Dyspnea, anxiety  Plan: Patient  with labs and imaging as dictated above. Patient is in no acute distress, currently improved after Ativan. Chest x-ray revealed stable findings. His labs are not significant change from prior. He'll be discharged with anxiolytics and encouraged to have close outpatient follow-up.He is also requesting something to help him sleep at night, I will try melatonin for sleep.   Earleen Newport, MD   Note: This dictation was prepared with Dragon dictation. Any transcriptional errors that result from this process are unintentional   Earleen Newport, MD 10/29/15 1516  Earleen Newport, MD 10/29/15 [phone]

## 2015-10-29 NOTE — ED Notes (Signed)
Pt c/o shaking all over. No pain or other sx noted.

## 2015-10-29 NOTE — ED Notes (Signed)
Pt reports still shaking a little but feels better after second dose medciation

## 2015-10-29 NOTE — ED Notes (Signed)
Pt reports does not feel any better after medication.

## 2015-10-29 NOTE — ED Notes (Signed)
Patient transported to X-ray 

## 2015-10-29 NOTE — Discharge Instructions (Signed)
Generalized Anxiety Disorder Generalized anxiety disorder (GAD) is a mental disorder. It interferes with life functions, including relationships, work, and school. GAD is different from normal anxiety, which everyone experiences at some point in their lives in response to specific life events and activities. Normal anxiety actually helps [place] prepare for and get through these life events and activities. Normal anxiety goes away after the event or activity is over.  GAD causes anxiety that is not necessarily related to specific events or activities. It also causes excess anxiety in proportion to specific events or activities. The anxiety associated with GAD is also difficult to control. GAD can vary from mild to severe. People with severe GAD can have intense waves of anxiety with physical symptoms (panic attacks).  SYMPTOMS The anxiety and worry associated with GAD are difficult to control. This anxiety and worry are related to many life events and activities and also occur more days than not for 6 months or longer. People with GAD also have three or more of the following symptoms (one or more in children):  Restlessness.   Fatigue.  Difficulty concentrating.   Irritability.  Muscle tension.  Difficulty sleeping or unsatisfying sleep. DIAGNOSIS GAD is diagnosed through an assessment by your health care provider. Your health care provider will ask you questions aboutyour mood,physical symptoms, and events in your life. Your health care provider may ask you about your medical history and use of alcohol or drugs, including prescription medicines. Your health care provider may also do a physical exam and blood tests. Certain medical conditions and the use of certain substances can cause symptoms similar to those associated with GAD. Your health care provider may refer you to a mental health specialist for further evaluation. TREATMENT The following therapies are usually used to treat GAD:    Medication. Antidepressant medication usually is prescribed for long-term daily control. Antianxiety medicines may be added in severe cases, especially when panic attacks occur.   Talk therapy (psychotherapy). Certain types of talk therapy can be helpful in treating GAD by providing support, education, and guidance. A form of talk therapy called cognitive behavioral therapy can teach you healthy ways to think about and react to daily life events and activities.  Stress managementtechniques. These include yoga, meditation, and exercise and can be very helpful when they are practiced regularly. A mental health specialist can help determine which treatment is best for you. Some people see improvement with one therapy. However, other people require a combination of therapies.   This information is not intended to replace advice given to you by your health care provider. Make sure you discuss any questions you have with your health care provider.   Document Released: 07/30/2012 Document Revised: 04/25/2014 Document Reviewed: 07/30/2012 Elsevier Interactive Patient Education 2016 Elsevier Inc.  Panic Attacks Panic attacks are sudden, short-livedsurges of severe anxiety, fear, or discomfort. They may occur for no reason when you are relaxed, when you are anxious, or when you are sleeping. Panic attacks may occur for a number of reasons:   Healthy people occasionally have panic attacks in extreme, life-threatening situations, such as war or natural disasters. Normal anxiety is a protective mechanism of the body that helps [place] react to danger (fight or flight response).  Panic attacks are often seen with anxiety disorders, such as panic disorder, social anxiety disorder, generalized anxiety disorder, and phobias. Anxiety disorders cause excessive or uncontrollable anxiety. They may interfere with your relationships or other life activities.  Panic attacks are sometimes seen with other  mental  illnesses, such as depression and posttraumatic stress disorder.  Certain medical conditions, prescription medicines, and drugs of abuse can cause panic attacks. SYMPTOMS  Panic attacks start suddenly, peak within 20 minutes, and are accompanied by four or more of the following symptoms:  Pounding heart or fast heart rate (palpitations).  Sweating.  Trembling or shaking.  Shortness of breath or feeling smothered.  Feeling choked.  Chest pain or discomfort.  Nausea or strange feeling in your stomach.  Dizziness, light-headedness, or feeling like you will faint.  Chills or hot flushes.  Numbness or tingling in your lips or hands and feet.  Feeling that things are not real or feeling that you are not yourself.  Fear of losing control or going crazy.  Fear of dying. Some of these symptoms can mimic serious medical conditions. For example, you may think you are having a heart attack. Although panic attacks can be very scary, they are not life threatening. DIAGNOSIS  Panic attacks are diagnosed through an assessment by your health care provider. Your health care provider will ask questions about your symptoms, such as where and when they occurred. Your health care provider will also ask about your medical history and use of alcohol and drugs, including prescription medicines. Your health care provider may order blood tests or other studies to rule out a serious medical condition. Your health care provider may refer you to a mental health professional for further evaluation. TREATMENT   Most healthy people who have one or two panic attacks in an extreme, life-threatening situation will not require treatment.  The treatment for panic attacks associated with anxiety disorders or other mental illness typically involves counseling with a mental health professional, medicine, or a combination of both. Your health care provider will help determine what treatment is best for you.  Panic  attacks due to physical illness usually go away with treatment of the illness. If prescription medicine is causing panic attacks, talk with your health care provider about stopping the medicine, decreasing the dose, or substituting another medicine.  Panic attacks due to alcohol or drug abuse go away with abstinence. Some adults need professional help in order to stop drinking or using drugs. HOME CARE INSTRUCTIONS   Take all medicines as directed by your health care provider.   Schedule and attend follow-up visits as directed by your health care provider. It is important to keep all your appointments. SEEK MEDICAL CARE IF:  You are not able to take your medicines as prescribed.  Your symptoms do not improve or get worse. SEEK IMMEDIATE MEDICAL CARE IF:   You experience panic attack symptoms that are different than your usual symptoms.  You have serious thoughts about hurting yourself or others.  You are taking medicine for panic attacks and have a serious side effect. MAKE SURE YOU:  Understand these instructions.  Will watch your condition.  Will get help right away if you are not doing well or get worse.   This information is not intended to replace advice given to you by your health care provider. Make sure you discuss any questions you have with your health care provider.   Document Released: 04/04/2005 Document Revised: 04/09/2013 Document Reviewed: 11/16/2012 Elsevier Interactive Patient Education [employer].

## 2015-10-29 NOTE — ED Notes (Signed)
Pt c/o shaking all over.  Denies any pain or increased shob. No fevers that pt is aware of

## 2015-10-30 ENCOUNTER — Ambulatory Visit
Admission: RE | Admit: 2015-10-30 | Discharge: 2015-10-30 | Disposition: A | Discharge status: Home / Self Care | Payer: Medicare HMO | Source: Ambulatory Visit | Attending: Radiation Oncology | Admitting: Radiation Oncology

## 2015-10-30 ENCOUNTER — Other Ambulatory Visit: Payer: Self-pay | Admitting: Internal Medicine

## 2015-10-30 ENCOUNTER — Encounter: Payer: Self-pay | Admitting: Radiation Oncology

## 2015-10-30 VITALS — BP 133/67 | HR 103 | Temp 96.5°F | Resp 20 | Wt 132.4 lb

## 2015-10-30 DIAGNOSIS — R918 Other nonspecific abnormal finding of lung field: Secondary | ICD-10-CM | POA: Insufficient documentation

## 2015-10-30 DIAGNOSIS — F29 Unspecified psychosis not due to a substance or known physiological condition: Secondary | ICD-10-CM | POA: Insufficient documentation

## 2015-10-30 DIAGNOSIS — C7951 Secondary malignant neoplasm of bone: Secondary | ICD-10-CM | POA: Insufficient documentation

## 2015-10-30 DIAGNOSIS — C3412 Malignant neoplasm of upper lobe, left bronchus or lung: Secondary | ICD-10-CM | POA: Insufficient documentation

## 2015-10-30 DIAGNOSIS — R634 Abnormal weight loss: Secondary | ICD-10-CM | POA: Insufficient documentation

## 2015-10-30 NOTE — Progress Notes (Signed)
Radiation Oncology Follow up Note  Name: Timothy Ali   Date:   10/30/2015 MRN:  [id number] DOB: 1930/04/18    This 82 y.o. male presents to the clinic today for 9 month follow-up status post combined modality treatment as well as SB RT 2 years prior for left lungs. Segment non-small cell lung cancer. Follow-up now for new lesion abutting the thoracic spinal column.Marland Kitchen  REFERRING PROVIDER: Albina Billet, MD  HPI: Patient is a 82 year old male oxygen dependent status post SB RT therapy for a T1 lesion of the left upper lobe 2 years prior as well as now out 9 months having completed combined modality treatment for a lesion abutting the thoracic spinal column eroding bone in close proximity to the spinal cord. He is now seen 9 months out and is doing only fair. Continues to lose weight.. His most recent CT scan shows mild increase of the left posterior extrapleural chest wall lesion a left lower lobe subpleural nodule suspicious for progression of disease. He also has multifocal interstitial patchy opacities in the left upper lobe which a my review is consistent with radiation changes. He has been started on salvage chemotherapy with gemcitabine and carboplatinum every 2 weeks. Patient is a history of psychosis and today's requesting Valium which I have deferred. He specifically denies hemoptysis.  COMPLICATIONS OF TREATMENT: none  FOLLOW UP COMPLIANCE: keeps appointments   PHYSICAL EXAM:  BP 133/67 mmHg  Pulse 103  Temp(Src) 96.5 F (35.8 C)  Resp 20  Wt 132 lb 6.2 oz (60.05 kg) Thin cachectic male with nasal oxygen in NAD. Well-developed well-nourished patient in NAD. HEENT reveals PERLA, EOMI, discs not visualized.  Oral cavity is clear. No oral mucosal lesions are identified. Neck is clear without evidence of cervical or supraclavicular adenopathy. Lungs are clear to A&P. Cardiac examination is essentially unremarkable with regular rate and rhythm without murmur rub or thrill. Abdomen is  benign with no organomegaly or masses noted. Motor sensory and DTR levels are equal and symmetric in the upper and lower extremities. Cranial nerves II through XII are grossly intact. Proprioception is intact. No peripheral adenopathy or edema is identified. No motor or sensory levels are noted. Crude visual fields are within normal range.  RADIOLOGY RESULTS: Recent CT scans are reviewed  PLAN: Present time patient is doing only fair. He is being treated with salvage chemotherapy for possible progression of his disease. He continues close follow-up care with medical oncology. I've asked to see him back in 6 months for follow-up. I would be happy to reevaluate the patient any time should palliative treatment be indicated. I've also instructed him I will not be prescribing any narcotics or Valium and that she be handled with his medical oncology team.  I would like to take this opportunity to thank you for allowing me to participate in the care of your patient.Armstead Peaks., MD

## 2015-10-31 ENCOUNTER — Inpatient Hospital Stay
Admission: EM | Admit: 2015-10-31 | Discharge: 2015-11-02 | DRG: 190 | Disposition: A | Discharge status: Home Health | Payer: Medicare HMO | Attending: Internal Medicine | Admitting: Internal Medicine

## 2015-10-31 ENCOUNTER — Emergency Department: Payer: Medicare HMO

## 2015-10-31 DIAGNOSIS — F419 Anxiety disorder, unspecified: Secondary | ICD-10-CM | POA: Diagnosis present

## 2015-10-31 DIAGNOSIS — J189 Pneumonia, unspecified organism: Secondary | ICD-10-CM | POA: Diagnosis present

## 2015-10-31 DIAGNOSIS — Z9981 Dependence on supplemental oxygen: Secondary | ICD-10-CM

## 2015-10-31 DIAGNOSIS — Z825 Family history of asthma and other chronic lower respiratory diseases: Secondary | ICD-10-CM | POA: Diagnosis not present

## 2015-10-31 DIAGNOSIS — Z880 Allergy status to penicillin: Secondary | ICD-10-CM | POA: Diagnosis not present

## 2015-10-31 DIAGNOSIS — J440 Chronic obstructive pulmonary disease with acute lower respiratory infection: Principal | ICD-10-CM | POA: Diagnosis present

## 2015-10-31 DIAGNOSIS — E785 Hyperlipidemia, unspecified: Secondary | ICD-10-CM | POA: Diagnosis present

## 2015-10-31 DIAGNOSIS — Z792 Long term (current) use of antibiotics: Secondary | ICD-10-CM

## 2015-10-31 DIAGNOSIS — Z7951 Long term (current) use of inhaled steroids: Secondary | ICD-10-CM

## 2015-10-31 DIAGNOSIS — F329 Major depressive disorder, single episode, unspecified: Secondary | ICD-10-CM | POA: Diagnosis present

## 2015-10-31 DIAGNOSIS — N400 Enlarged prostate without lower urinary tract symptoms: Secondary | ICD-10-CM | POA: Diagnosis present

## 2015-10-31 DIAGNOSIS — H3530 Unspecified macular degeneration: Secondary | ICD-10-CM | POA: Diagnosis present

## 2015-10-31 DIAGNOSIS — Z79899 Other long term (current) drug therapy: Secondary | ICD-10-CM

## 2015-10-31 DIAGNOSIS — Z801 Family history of malignant neoplasm of trachea, bronchus and lung: Secondary | ICD-10-CM

## 2015-10-31 DIAGNOSIS — Z87891 Personal history of nicotine dependence: Secondary | ICD-10-CM | POA: Diagnosis not present

## 2015-10-31 DIAGNOSIS — F411 Generalized anxiety disorder: Secondary | ICD-10-CM | POA: Diagnosis present

## 2015-10-31 DIAGNOSIS — R0600 Dyspnea, unspecified: Secondary | ICD-10-CM | POA: Diagnosis present

## 2015-10-31 DIAGNOSIS — C3412 Malignant neoplasm of upper lobe, left bronchus or lung: Secondary | ICD-10-CM | POA: Diagnosis present

## 2015-10-31 DIAGNOSIS — K219 Gastro-esophageal reflux disease without esophagitis: Secondary | ICD-10-CM | POA: Diagnosis present

## 2015-10-31 DIAGNOSIS — J9621 Acute and chronic respiratory failure with hypoxia: Secondary | ICD-10-CM | POA: Diagnosis present

## 2015-10-31 DIAGNOSIS — R0603 Acute respiratory distress: Secondary | ICD-10-CM

## 2015-10-31 LAB — CBC WITH DIFFERENTIAL/PLATELET
Basophils Absolute: 0 10*3/uL (ref 0–0.1)
Basophils Relative: 0 %
EOS ABS: 0.1 10*3/uL (ref 0–0.7)
Eosinophils Relative: 1 %
HEMATOCRIT: 31.6 % — AB (ref 40.0–52.0)
HEMOGLOBIN: 10.8 g/dL — AB (ref 13.0–18.0)
LYMPHS ABS: 0.7 10*3/uL — AB (ref 1.0–3.6)
LYMPHS PCT: 8 %
MCH: 29.9 pg (ref 26.0–34.0)
MCHC: 34.1 g/dL (ref 32.0–36.0)
MCV: 87.9 fL (ref 80.0–100.0)
MONOS PCT: 11 %
Monocytes Absolute: 1 10*3/uL (ref 0.2–1.0)
NEUTROS PCT: 80 %
Neutro Abs: 7.2 10*3/uL — ABNORMAL HIGH (ref 1.4–6.5)
Platelets: 139 10*3/uL — ABNORMAL LOW (ref 150–440)
RBC: 3.6 MIL/uL — ABNORMAL LOW (ref 4.40–5.90)
RDW: 18.7 % — ABNORMAL HIGH (ref 11.5–14.5)
WBC: 9 10*3/uL (ref 3.8–10.6)

## 2015-10-31 LAB — URINALYSIS COMPLETE WITH MICROSCOPIC (ARMC ONLY)
BILIRUBIN URINE: NEGATIVE
GLUCOSE, UA: NEGATIVE mg/dL
Ketones, ur: NEGATIVE mg/dL
NITRITE: NEGATIVE
Protein, ur: NEGATIVE mg/dL
SPECIFIC GRAVITY, URINE: 1.008 (ref 1.005–1.030)
pH: 5 (ref 5.0–8.0)

## 2015-10-31 LAB — COMPREHENSIVE METABOLIC PANEL
ALK PHOS: 63 U/L (ref 38–126)
ALT: 11 U/L — ABNORMAL LOW (ref 17–63)
ANION GAP: 8 (ref 5–15)
AST: 18 U/L (ref 15–41)
Albumin: 2.8 g/dL — ABNORMAL LOW (ref 3.5–5.0)
BILIRUBIN TOTAL: 0.3 mg/dL (ref 0.3–1.2)
BUN: 17 mg/dL (ref 6–20)
CALCIUM: 7.7 mg/dL — AB (ref 8.9–10.3)
CO2: 26 mmol/L (ref 22–32)
CREATININE: 0.63 mg/dL (ref 0.61–1.24)
Chloride: 100 mmol/L — ABNORMAL LOW (ref 101–111)
Glucose, Bld: 150 mg/dL — ABNORMAL HIGH (ref 65–99)
Potassium: 3.7 mmol/L (ref 3.5–5.1)
SODIUM: 134 mmol/L — AB (ref 135–145)
TOTAL PROTEIN: 6 g/dL — AB (ref 6.5–8.1)

## 2015-10-31 LAB — BRAIN NATRIURETIC PEPTIDE: B NATRIURETIC PEPTIDE 5: 65 pg/mL (ref 0.0–100.0)

## 2015-10-31 LAB — TROPONIN I: Troponin I: <0.03 ng/mL (ref <0.03)

## 2015-10-31 LAB — LACTIC ACID, PLASMA: Lactic Acid, Venous: 1.5 mmol/L (ref 0.5–1.9)

## 2015-10-31 MED ORDER — SODIUM CHLORIDE 0.9 % IV BOLUS (SEPSIS)
1000.0000 mL | Freq: Once | INTRAVENOUS | Status: AC
  Administered 2015-10-31: 1000 mL via INTRAVENOUS

## 2015-10-31 MED ORDER — ENOXAPARIN SODIUM 40 MG/0.4ML ~~LOC~~ SOLN
40.0000 mg | SUBCUTANEOUS | Status: DC
  Administered 2015-10-31 – 2015-11-01 (×2): 40 mg via SUBCUTANEOUS
  Filled 2015-10-31 (×2): qty 0.4

## 2015-10-31 MED ORDER — ACETAMINOPHEN 650 MG RE SUPP: 650.0000 mg | Freq: Four times a day (QID) | RECTAL | Status: DC | PRN

## 2015-10-31 MED ORDER — ACETAMINOPHEN 325 MG PO TABS
650.0000 mg | ORAL_TABLET | Freq: Once | ORAL | Status: AC
  Administered 2015-10-31: 650 mg via ORAL
  Filled 2015-10-31: qty 2

## 2015-10-31 MED ORDER — LEVOFLOXACIN IN D5W 750 MG/150ML IV SOLN
750.0000 mg | INTRAVENOUS | Status: DC
  Administered 2015-11-01: 750 mg via INTRAVENOUS
  Filled 2015-10-31 (×2): qty 150

## 2015-10-31 MED ORDER — LORAZEPAM 1 MG PO TABS
1.0000 mg | ORAL_TABLET | Freq: Three times a day (TID) | ORAL | Status: DC
  Administered 2015-10-31 – 2015-11-02 (×5): 1 mg via ORAL
  Filled 2015-10-31 (×5): qty 1

## 2015-10-31 MED ORDER — FLUTICASONE FUROATE-VILANTEROL 100-25 MCG/INH IN AEPB
1.0000 | INHALATION_SPRAY | Freq: Every day | RESPIRATORY_TRACT | Status: DC
  Administered 2015-10-31 – 2015-11-02 (×3): 1 via RESPIRATORY_TRACT
  Filled 2015-10-31: qty 28

## 2015-10-31 MED ORDER — ONDANSETRON HCL 4 MG/2ML IJ SOLN: 4.0000 mg | Freq: Four times a day (QID) | INTRAMUSCULAR | Status: DC | PRN

## 2015-10-31 MED ORDER — LEVOFLOXACIN IN D5W 750 MG/150ML IV SOLN
750.0000 mg | Freq: Once | INTRAVENOUS | Status: AC
  Administered 2015-10-31: 750 mg via INTRAVENOUS
  Filled 2015-10-31: qty 150

## 2015-10-31 MED ORDER — LORAZEPAM 1 MG PO TABS
1.0000 mg | ORAL_TABLET | Freq: Once | ORAL | Status: AC
  Administered 2015-10-31: 1 mg via ORAL
  Filled 2015-10-31: qty 1

## 2015-10-31 MED ORDER — ONDANSETRON HCL 4 MG PO TABS: 8.0000 mg | ORAL_TABLET | Freq: Three times a day (TID) | ORAL | Status: DC | PRN

## 2015-10-31 MED ORDER — TIOTROPIUM BROMIDE MONOHYDRATE 18 MCG IN CAPS
18.0000 ug | ORAL_CAPSULE | Freq: Every day | RESPIRATORY_TRACT | Status: DC
  Administered 2015-10-31 – 2015-11-02 (×3): 18 ug via RESPIRATORY_TRACT
  Filled 2015-10-31: qty 5

## 2015-10-31 MED ORDER — VANCOMYCIN HCL IN DEXTROSE 1-5 GM/200ML-% IV SOLN
1000.0000 mg | Freq: Once | INTRAVENOUS | Status: AC
  Administered 2015-10-31: 1000 mg via INTRAVENOUS
  Filled 2015-10-31: qty 200

## 2015-10-31 MED ORDER — ALBUTEROL SULFATE (2.5 MG/3ML) 0.083% IN NEBU
3.0000 mL | INHALATION_SOLUTION | RESPIRATORY_TRACT | Status: DC | PRN
  Administered 2015-10-31 – 2015-11-01 (×2): 3 mL via RESPIRATORY_TRACT
  Filled 2015-10-31 (×2): qty 3

## 2015-10-31 MED ORDER — ONDANSETRON HCL 4 MG PO TABS: 4.0000 mg | ORAL_TABLET | Freq: Four times a day (QID) | ORAL | Status: DC | PRN

## 2015-10-31 MED ORDER — PANTOPRAZOLE SODIUM 40 MG PO TBEC
40.0000 mg | DELAYED_RELEASE_TABLET | Freq: Every day | ORAL | Status: DC
  Administered 2015-10-31 – 2015-11-02 (×3): 40 mg via ORAL
  Filled 2015-10-31 (×3): qty 1

## 2015-10-31 MED ORDER — GUAIFENESIN ER 600 MG PO TB12
600.0000 mg | ORAL_TABLET | Freq: Two times a day (BID) | ORAL | Status: DC
  Administered 2015-10-31 – 2015-11-02 (×4): 600 mg via ORAL
  Filled 2015-10-31 (×4): qty 1

## 2015-10-31 MED ORDER — ATORVASTATIN CALCIUM 20 MG PO TABS
40.0000 mg | ORAL_TABLET | Freq: Every day | ORAL | Status: DC
  Administered 2015-10-31 – 2015-11-01 (×2): 40 mg via ORAL
  Filled 2015-10-31 (×2): qty 2

## 2015-10-31 MED ORDER — SODIUM CHLORIDE 0.9% FLUSH
3.0000 mL | Freq: Two times a day (BID) | INTRAVENOUS | Status: DC
  Administered 2015-10-31 – 2015-11-02 (×4): 3 mL via INTRAVENOUS

## 2015-10-31 MED ORDER — ACETAMINOPHEN 325 MG PO TABS: 650.0000 mg | ORAL_TABLET | Freq: Four times a day (QID) | ORAL | Status: DC | PRN

## 2015-10-31 MED ORDER — TAMSULOSIN HCL 0.4 MG PO CAPS
0.4000 mg | ORAL_CAPSULE | Freq: Every day | ORAL | Status: DC
  Administered 2015-11-01 – 2015-11-02 (×2): 0.4 mg via ORAL
  Filled 2015-10-31 (×2): qty 1

## 2015-10-31 NOTE — H&P (Signed)
[facility] [place] NAME: Timothy Ali    MR#:  [id number]  DATE OF BIRTH:  18-Aug-1929   DATE OF ADMISSION:  10/31/2015  PRIMARY CARE PHYSICIAN: Albina Billet, MD   REQUESTING/REFERRING PHYSICIAN: Dr. Charlotte Crumb  CHIEF COMPLAINT:   Chief Complaint  Patient presents with  . Shortness of Breath    HISTORY OF PRESENT ILLNESS:  Timothy Ali  is a 82 y.o. male with a known history of COPD, lung cancer, anxiety, depression, GERD who presents to the hospital complaining of worsening shortness of breath. Patient says he has been having some shortness of breath progressively getting worse over the past 2 days. He admits to a cough which is nonproductive in nature. He also has associated fever, chills. He denies any chest pain, hemoptysis, palpitations, syncope, abdominal pain, nausea vomiting or diarrhea. Given his worsening shortness of breath he was brought to the emergency room and noted to be in acute on chronic hypoxic respiratory failure and placed on BiPAP. His chest x-ray is consistent with pneumonia. Hospitalist services were contacted further treatment and evaluation.  PAST MEDICAL HISTORY:   Past Medical History  Diagnosis Date  . Major depressive disorder, recurrent, severe with psychotic features ([place])   . Persistent disorder of initiating or maintaining sleep   . Generalized anxiety disorder   . Major depressive disorder, recurrent, severe with psychotic features ([place])   . Persistent disorder of initiating or maintaining sleep   . Asthma   . COPD (chronic obstructive pulmonary disease) ([place])   . Cancer ([place])     Left lung    PAST SURGICAL HISTORY:   Past Surgical History  Procedure Laterality Date  . Hemorroidectomy    . Lung surgery      SOCIAL HISTORY:   Social History  Substance Use Topics  . Smoking status: Former Research scientist (life sciences)  . Smokeless tobacco: Never Used     Comment: quit 18 years ago  . Alcohol Use: No     FAMILY HISTORY:   Family History  Problem Relation Age of Onset  . COPD Mother   . Lung cancer Father   . Kidney disease Neg Hx   . Prostate cancer Neg Hx     DRUG ALLERGIES:   Allergies  Allergen Reactions  . Penicillins Other (See Comments)    Reaction:  Unknown     REVIEW OF SYSTEMS:   Review of Systems  Constitutional: Negative for fever and weight loss.  HENT: Negative for congestion, nosebleeds and tinnitus.   Eyes: Negative for blurred vision, double vision and redness.  Respiratory: Positive for cough, sputum production, shortness of breath and wheezing. Negative for hemoptysis.   Cardiovascular: Negative for chest pain, orthopnea, leg swelling and PND.  Gastrointestinal: Negative for nausea, vomiting, abdominal pain, diarrhea and melena.  Genitourinary: Negative for dysuria, urgency and hematuria.  Musculoskeletal: Negative for joint pain and falls.  Neurological: Negative for dizziness, tingling, sensory change, focal weakness, seizures, weakness and headaches.  Endo/Heme/Allergies: Negative for polydipsia. Does not bruise/bleed easily.  Psychiatric/Behavioral: Negative for depression and memory loss. The patient is not nervous/anxious.     MEDICATIONS AT HOME:   Prior to Admission medications   Medication Sig Start Date End Date Taking? Authorizing Provider  albuterol (PROVENTIL HFA;VENTOLIN HFA) 108 (90 BASE) MCG/ACT inhaler Inhale 2 puffs into the lungs every 4 (four) hours as needed for wheezing or shortness of breath.    Yes Historical Provider, MD  atorvastatin (LIPITOR) 40 MG tablet  Take 40 mg by mouth at bedtime.    Yes Historical Provider, MD  BREO ELLIPTA 100-25 MCG/INH AEPB Inhale 1 puff into the lungs daily.    Yes Historical Provider, MD  esomeprazole (NEXIUM) 40 MG capsule Take 40 mg by mouth daily.    Yes Historical Provider, MD  guaiFENesin (MUCINEX) 600 MG 12 hr tablet Take 1 tablet (600 mg total) by mouth 2 (two) times daily. 08/21/15  Yes  Dustin Flock, MD  LORazepam (ATIVAN) 1 MG tablet Take 1 tablet (1 mg total) by mouth 3 (three) times daily. 10/29/15 10/28/16 Yes Earleen Newport, MD  ondansetron (ZOFRAN) 8 MG tablet Take 8 mg by mouth every 8 (eight) hours as needed for nausea. Reported on 10/19/2015 10/05/15  Yes Historical Provider, MD  tamsulosin (FLOMAX) 0.4 MG CAPS capsule Take 0.4 mg by mouth daily after breakfast.    Yes Historical Provider, MD  tiotropium (SPIRIVA) 18 MCG inhalation capsule Place 18 mcg into inhaler and inhale daily.    Yes Historical Provider, MD  levofloxacin (LEVAQUIN) 500 MG tablet Take 1 tablet (500 mg total) by mouth daily. Patient not taking: Reported on 10/30/2015 10/19/15   Cammie Sickle, MD  LORazepam (ATIVAN) 1 MG tablet Take 1 tablet (1 mg total) by mouth every 8 (eight) hours as needed for anxiety. 10/27/15 10/26/16  Delman Kitten, MD  Melatonin 3 MG TABS Take 1 tablet (3 mg total) by mouth at bedtime. 10/29/15   Earleen Newport, MD  venlafaxine XR (EFFEXOR-XR) 150 MG 24 hr capsule Take 1 capsule (150 mg total) by mouth daily. 09/21/15   Himabindu Ravi, MD      VITAL SIGNS:  Blood pressure 118/59, pulse 92, temperature 102 F (38.9 C), temperature source Oral, resp. rate 27, height '5\' 7"'$  (1.702 m), weight 58.968 kg (130 lb), SpO2 100 %.  PHYSICAL EXAMINATION:  Physical Exam  GENERAL:  82 y.o.-year-old patient lying in the bed mild resp. Distress.   EYES: Pupils equal, round, reactive to light and accommodation. No scleral icterus. Extraocular muscles intact.  HEENT: Head atraumatic, normocephalic. Oropharynx and nasopharynx clear. No oropharyngeal erythema, moist oral mucosa  NECK:  Supple, no jugular venous distention. No thyroid enlargement, no tenderness.  LUNGS: Diffuse wheezing, rhonchi bilaterally. Positive use of accessory muscles. No dullness to percussion. CARDIOVASCULAR: S1, S2 RRR. No murmurs, rubs, gallops, clicks.  ABDOMEN: Soft, nontender, nondistended. Bowel sounds  present. No organomegaly or mass.  EXTREMITIES: No pedal edema, cyanosis, or clubbing. + 2 pedal & radial pulses b/l.   NEUROLOGIC: Cranial nerves II through XII are intact. No focal Motor or sensory deficits appreciated b/l PSYCHIATRIC: The patient is alert and oriented x 3. Good affect.  SKIN: No obvious rash, lesion, or ulcer.   LABORATORY PANEL:   CBC  Recent Labs Lab 10/31/15 1658  WBC 9.0  HGB 10.8*  HCT 31.6*  PLT 139*   ------------------------------------------------------------------------------------------------------------------  Chemistries   Recent Labs Lab 10/31/15 1658  NA 134*  K 3.7  CL 100*  CO2 26  GLUCOSE 150*  BUN 17  CREATININE 0.63  CALCIUM 7.7*  AST 18  ALT 11*  ALKPHOS 63  BILITOT 0.3   ------------------------------------------------------------------------------------------------------------------  Cardiac Enzymes  Recent Labs Lab 10/31/15 1658  TROPONINI <0.03   ------------------------------------------------------------------------------------------------------------------  RADIOLOGY:  Dg Chest Port 1 View  10/31/2015  CLINICAL DATA:  Shortness of breath and tachypnea EXAM: PORTABLE CHEST 1 VIEW COMPARISON:  10/29/2015 FINDINGS: Normal heart size. There is chronic interstitial coarsening identified throughout both lungs  compatible with advanced changes of COPD. Asymmetric opacity in the left lung base is identified suspicious for superimposed infection. IMPRESSION: 1. Persistent left lung base opacity worrisome for infection. 2. Advanced changes of COPD/emphysema. 3. Followup PA and lateral chest X-ray is recommended in 3-4 weeks following trial of antibiotic therapy to ensure resolution and exclude underlying malignancy. Electronically Signed   By: Kerby Moors M.D.   On: 10/31/2015 17:09     IMPRESSION AND PLAN:   82 year old male with past medical history of lung cancer, COPD, anxiety/depression, GERD who presents to the  hospital with shortness of breath.  1. Acute on chronic respiratory failure with hypoxia-this is likely secondary to underlying pneumonia seen on chest x-ray. -We will treat the patient with IV Levaquin, continue O2 supplementation and wean off oxygen as tolerated. -Patient currently is on BiPAP and will tend to wean off to nasal cannula presently.  2. Pneumonia-likely community-acquired. -I'll treat the patient with IV Levaquin, follow blood, sputum cultures.  3. Lung cancer-patient is followed at the [facility]. -he is currently getting chemotherapy and is due for chemotherapy this coming Monday.  Follows w/ Dr. Rogue Bussing.   4. COPD-no acute exacerbations presently. -Continue Breo-Ellipita, Spiriva.   5. GERD-continue Protonix.  6. Hyperlipidemia-continue atorvastatin.  7. BPH-continue Flomax.    All the records are reviewed and case discussed with ED provider. Management plans discussed with the patient, family and they are in agreement.  CODE STATUS: Full  TOTAL TIME TAKING CARE OF THIS PATIENT: 45 minutes.    Henreitta Leber M.D on 10/31/2015 at 6:56 PM  Between 7am to 6pm - Pager - [phone]  After 6pm go to [email or web address] - password [email or web address] [facility]  Office  [phone]  CC: Primary care physician; Albina Billet, MD

## 2015-10-31 NOTE — ED Provider Notes (Addendum)
[facility] Emergency Department Provider Note  ____________________________________________   I have reviewed the triage vital signs and the nursing notes.   HISTORY  Chief Complaint Shortness of Breath    HPI Timothy Ali is a 82 y.o. male who presents today complaining of cough and shortness of breath. Patient has lung cancer, and states she is on chemotherapy. He states he has had a productive cough. Became acutely worse today. Denies any abdominal pain or hemoptysis. States the cough is somewhat productive. She is on 2 L home oxygen and he was satting in the mid 80s upon arrival of EMS. They gave him Solu-Medrol and 2 agonist treatments he is now in the mid 59s. He is still working to breathe. I did ask the patient if he would like to be intubated if it comes to that and he states he would not normally wish to have CPR. He gets very acutely ill he would like to be made comfortable. I believe the patient does understand the ramifications of this decision. Patient has had no leg swelling has no history of CHF he states. He was febrile for EMS. No further history is immediately available given acuity of patient presentation      Past Medical History  Diagnosis Date  . Major depressive disorder, recurrent, severe with psychotic features ([place])   . Persistent disorder of initiating or maintaining sleep   . Generalized anxiety disorder   . Major depressive disorder, recurrent, severe with psychotic features ([place])   . Persistent disorder of initiating or maintaining sleep   . Asthma   . COPD (chronic obstructive pulmonary disease) ([place])   . Cancer [facility]     Left lung    Patient Active Problem List   Diagnosis Date Noted  . Cancer of upper lobe of left lung ([place]) 10/20/2015  . Dementia 08/31/2015  . Generalized anxiety disorder 08/31/2015  . SOB (shortness of breath) 08/19/2015  . BPH (benign prostatic hypertrophy) with urinary retention 02/16/2015  .  Metastatic lung cancer (metastasis from lung to other site) ([place]) 02/16/2015  . Lung cancer ([place]) 10/24/2014  . Acute on chronic respiratory failure ([place]) 10/23/2014  . Leucocytosis 10/23/2014  . COPD (chronic obstructive pulmonary disease) ([place]) 10/23/2014  . Carcinoma, lung ([place]) 10/23/2014  . Cancer of lung ([place]) 10/23/2014  . CAFL (chronic airflow limitation) ([place]) 08/01/2014  . Depression, major, recurrent, severe with psychosis ([place]) 08/01/2014  . H/O gastrointestinal disease 08/01/2014  . Anxiety, generalized 08/01/2014  . H/O elevated lipids 08/01/2014  . Insomnia, persistent 08/01/2014  . H/O malignant neoplasm 08/01/2014  . Prostate disease 08/01/2014  . Macular degeneration 08/01/2014  . Obstruction of urinary tract 08/01/2014  . Chronic obstructive pulmonary disease ([place]) 08/01/2014  . Severe episode of recurrent major depressive disorder, with psychotic features ([place]) 08/01/2014    Past Surgical History  Procedure Laterality Date  . Hemorroidectomy    . Lung surgery      Current Outpatient Rx  Name  Route  Sig  Dispense  Refill  . albuterol (PROVENTIL HFA;VENTOLIN HFA) 108 (90 BASE) MCG/ACT inhaler   Inhalation   Inhale 2 puffs into the lungs every 4 (four) hours as needed for wheezing or shortness of breath.          Marland Kitchen atorvastatin (LIPITOR) 40 MG tablet   Oral   Take 40 mg by mouth at bedtime.          Marland Kitchen BREO ELLIPTA 100-25 MCG/INH AEPB   Inhalation  Inhale 1 puff into the lungs daily.            Dispense as written.   Marland Kitchen esomeprazole (NEXIUM) 40 MG capsule   Oral   Take 40 mg by mouth daily.          Marland Kitchen guaiFENesin (MUCINEX) 600 MG 12 hr tablet   Oral   Take 1 tablet (600 mg total) by mouth 2 (two) times daily.   20 tablet   0   . levofloxacin (LEVAQUIN) 500 MG tablet   Oral   Take 1 tablet (500 mg total) by mouth daily. Patient not taking: Reported on 10/30/2015   14 tablet   0   . LORazepam (ATIVAN) 1 MG tablet   Oral   Take 1  tablet (1 mg total) by mouth every 8 (eight) hours as needed for anxiety.   15 tablet   0   . LORazepam (ATIVAN) 1 MG tablet   Oral   Take 1 tablet (1 mg total) by mouth 3 (three) times daily.   30 tablet   0   . Melatonin 3 MG TABS   Oral   Take 1 tablet (3 mg total) by mouth at bedtime.   30 tablet   0   . ondansetron (ZOFRAN) 8 MG tablet   Oral   Take 8 mg by mouth every 8 (eight) hours as needed for nausea. Reported on 10/19/2015         . tamsulosin (FLOMAX) 0.4 MG CAPS capsule   Oral   Take 0.4 mg by mouth daily after breakfast.          . tiotropium (SPIRIVA) 18 MCG inhalation capsule   Inhalation   Place 18 mcg into inhaler and inhale daily.          Marland Kitchen venlafaxine XR (EFFEXOR-XR) 150 MG 24 hr capsule   Oral   Take 1 capsule (150 mg total) by mouth daily.   90 capsule   1     HOLD FILLING UNTIL PATIENT CALLS FOR REFILL.     Allergies Penicillins  Family History  Problem Relation Age of Onset  . COPD Mother   . Lung cancer Father   . Kidney disease Neg Hx   . Prostate cancer Neg Hx     Social History Social History  Substance Use Topics  . Smoking status: Former Research scientist (life sciences)  . Smokeless tobacco: Never Used     Comment: quit 18 years ago  . Alcohol Use: No    Review of Systems Constitutional: Positive fever for EMS she was unaware of the Eyes: No visual changes. ENT: No sore throat. No stiff neck no neck pain Cardiovascular: Denies chest pain. Respiratory: Positive shortness of breath. Gastrointestinal:   no vomiting.  No diarrhea.  No constipation. Genitourinary: Negative for dysuria. Musculoskeletal: Negative lower extremity swelling Skin: Negative for rash. Neurological: Negative for headaches, focal weakness or numbness. 10-point ROS otherwise negative.  ____________________________________________   PHYSICAL EXAM:  VITAL SIGNS: ED Triage Vitals  Enc Vitals Group     BP --      Pulse --      Resp --      Temp --      Temp src  --      SpO2 --      Weight --      Height --      Head Cir --      Peak Flow --      Pain Score --  Pain Loc --      Pain Edu? --      Excl. in [place]? --     Constitutional: Alert and oriented. Elderly cachectic gentleman breathing rapidly with accessory muscle use. Able to speak in full sentences and alert and oriented Eyes: Conjunctivae are normal. PERRL. EOMI. Head: Atraumatic. Nose: No congestion/rhinnorhea. Mouth/Throat: Mucous membranes are moist.  Oropharynx non-erythematous. Neck: No stridor.   Nontender with no meningismus Cardiovascular: Mild tachycardia noted, regular rhythm. Grossly normal heart sounds.  Good peripheral circulation. Respiratory: Diminished in the bases with occasional rhonchi, productive cough occasionally auscultated, tachypnea noted. Accessory muscle use noted. Abdominal: Soft and nontender. No distention. No guarding no rebound Back:  There is no focal tenderness or step off there is no midline tenderness there are no lesions noted. there is no CVA tenderness Musculoskeletal: No lower extremity tenderness. No joint effusions, no DVT signs strong distal pulses no edema Neurologic:  Normal speech and language. No gross focal neurologic deficits are appreciated.  Skin:  Skin is warm, dry and intact. No rash noted. Psychiatric: Mood and affect are normal. Speech and behavior are normal.  ____________________________________________   LABS (all labs ordered are listed, but only abnormal results are displayed)  Labs Reviewed  CULTURE, BLOOD (ROUTINE X 2)  CULTURE, BLOOD (ROUTINE X 2)  URINE CULTURE  BRAIN NATRIURETIC PEPTIDE  TROPONIN I  LACTIC ACID, PLASMA  LACTIC ACID, PLASMA  CBC WITH DIFFERENTIAL/PLATELET  COMPREHENSIVE METABOLIC PANEL  URINALYSIS COMPLETEWITH MICROSCOPIC ([facility] ONLY)   ____________________________________________  EKG  I personally interpreted any EKGs ordered by me or triage Sinus tach rate 129, respiratory artifact  limits interpretation normal axis no acute ST elevation or depression, ____________________________________________  RADIOLOGY  I reviewed any imaging ordered by me or triage that were performed during my shift and, if possible, patient and/or family made aware of any abnormal findings. ____________________________________________   PROCEDURES  Procedure(s) performed: None  Critical Care performed: CRITICAL CARE Performed by: Schuyler Amor   Total critical care time: 50 minutes  Critical care time was exclusive of separately billable procedures and treating other patients.  Critical care was necessary to treat or prevent imminent or life-threatening deterioration.  Critical care was time spent personally by me on the following activities: development of treatment plan with patient and/or surrogate as well as nursing, discussions with consultants, evaluation of patient's response to treatment, examination of patient, obtaining history from patient or surrogate, ordering and performing treatments and interventions, ordering and review of laboratory studies, ordering and review of radiographic studies, pulse oximetry and re-evaluation of patient's condition.   ____________________________________________   INITIAL IMPRESSION / ASSESSMENT AND PLAN / ED COURSE  Pertinent labs & imaging results that were available during my care of the patient were reviewed by me and considered in my medical decision making (see chart for details).  Patient with fever, tachycardia on chemotherapy with cough concerning for possible pneumonia. We will begin broad-spectrum empiric therapy with Achromycin and levofloxacin as he is penicillin allergic. We'll give him sepsis doses of IV fluids, antipyretics, patient is not currently wheezing, we will place him on BiPAP, patient does not wish to be intubated if he deteriorates. We will observe him closely in the emergency department. He will require  admission.   FINAL CLINICAL IMPRESSION(S) / ED DIAGNOSES  Final diagnoses:  None      This chart was dictated using voice recognition software.  Despite best efforts to proofread,  errors can occur which can change meaning.  Schuyler Amor, MD 10/31/15 [place], MD 10/31/15 [phone]

## 2015-10-31 NOTE — ED Notes (Signed)
Pt's family reports swelling to pt's left hand and forearm, swollen area distal of IV, IV flushed and working appropriately.  Watch removed and indentation noted where watch was.  Small scab noted to pt's left hand, family unsure if/when pt might have injured.  Pt does not c/o pain to area.  Floor RN to be notified to monitor area.

## 2015-10-31 NOTE — ED Notes (Signed)
Called [facility] initiated Code Sepsis to [place]

## 2015-10-31 NOTE — ED Notes (Signed)
Pt presents via EMS c/o SOB and tachypnea. EMS reports 02 saturation 89% on home 02. 93.% on 4L upon arrival. Pt skin hot to touch. Tachypnea.

## 2015-10-31 NOTE — ED Notes (Signed)
Attempted to call report, receiving nurse and charge nurse unavailable at this time, floor given this nurse's number for receiving nurse to call back.

## 2015-10-31 NOTE — Progress Notes (Signed)
Pharmacy Antibiotic Note  Timothy Ali is a 82 y.o. male admitted on 10/31/2015 with pneumonia.  Pharmacy has been consulted for Levaquin dosing.  Plan: Levaquin 750 mg IV q 24 hours ordered.  Height: '5\' 7"'$  (170.2 cm) Weight: 130 lb (58.968 kg) IBW/kg (Calculated) : 66.1  Temp (24hrs), Avg:99.5 F (37.5 C), Min:97.7 F (36.5 C), Max:102 F (38.9 C)   Recent Labs Lab 10/29/15 1342 10/31/15 1658  WBC 11.3* 9.0  CREATININE 0.74 0.63  LATICACIDVEN  --  1.5    Estimated Creatinine Clearance: 57.4 mL/min (by C-G formula based on Cr of 0.63).    Allergies  Allergen Reactions  . Penicillins Other (See Comments)    Reaction:  Unknown     Antimicrobials this admission: Levofloxacin  >>    >>   Dose adjustments this admission:   Microbiology results: 7/15 BCx: pending 7/15 UCx: pending    7/15 CXR: persistent LL base opacity 7/15:  UA: (-)  Thank you for allowing pharmacy to be a part of this patient's care.  Shirlie Enck S 10/31/2015 11:35 PM

## 2015-10-31 NOTE — ED Notes (Signed)
Attempted to call report again, per floor, receiving nurse and charge nurse are involved in a rapid response and will call back

## 2015-11-01 LAB — BASIC METABOLIC PANEL
ANION GAP: 4 — AB (ref 5–15)
BUN: 13 mg/dL (ref 6–20)
CHLORIDE: 108 mmol/L (ref 101–111)
CO2: 28 mmol/L (ref 22–32)
Calcium: 8 mg/dL — ABNORMAL LOW (ref 8.9–10.3)
Creatinine, Ser: 0.5 mg/dL — ABNORMAL LOW (ref 0.61–1.24)
Glucose, Bld: 139 mg/dL — ABNORMAL HIGH (ref 65–99)
POTASSIUM: 3.3 mmol/L — AB (ref 3.5–5.1)
SODIUM: 140 mmol/L (ref 135–145)

## 2015-11-01 LAB — BLOOD CULTURE ID PANEL (REFLEXED)
ACINETOBACTER BAUMANNII: NOT DETECTED
CANDIDA GLABRATA: NOT DETECTED
CARBAPENEM RESISTANCE: NOT DETECTED
Candida albicans: NOT DETECTED
Candida krusei: NOT DETECTED
Candida parapsilosis: NOT DETECTED
Candida tropicalis: NOT DETECTED
ENTEROBACTERIACEAE SPECIES: NOT DETECTED
ESCHERICHIA COLI: NOT DETECTED
Enterobacter cloacae complex: NOT DETECTED
Enterococcus species: NOT DETECTED
HAEMOPHILUS INFLUENZAE: NOT DETECTED
Klebsiella oxytoca: NOT DETECTED
Klebsiella pneumoniae: NOT DETECTED
LISTERIA MONOCYTOGENES: NOT DETECTED
METHICILLIN RESISTANCE: NOT DETECTED
NEISSERIA MENINGITIDIS: NOT DETECTED
PSEUDOMONAS AERUGINOSA: NOT DETECTED
Proteus species: NOT DETECTED
SERRATIA MARCESCENS: NOT DETECTED
STAPHYLOCOCCUS SPECIES: DETECTED — AB
STREPTOCOCCUS PYOGENES: NOT DETECTED
STREPTOCOCCUS SPECIES: NOT DETECTED
Staphylococcus aureus (BCID): NOT DETECTED
Streptococcus agalactiae: NOT DETECTED
Streptococcus pneumoniae: NOT DETECTED
Vancomycin resistance: NOT DETECTED

## 2015-11-01 LAB — CBC
HEMATOCRIT: 29.1 % — AB (ref 40.0–52.0)
Hemoglobin: 9.9 g/dL — ABNORMAL LOW (ref 13.0–18.0)
MCH: 30 pg (ref 26.0–34.0)
MCHC: 34.1 g/dL (ref 32.0–36.0)
MCV: 88 fL (ref 80.0–100.0)
Platelets: 119 10*3/uL — ABNORMAL LOW (ref 150–440)
RBC: 3.31 MIL/uL — AB (ref 4.40–5.90)
RDW: 19 % — AB (ref 11.5–14.5)
WBC: 7.7 10*3/uL (ref 3.8–10.6)

## 2015-11-01 MED ORDER — POLYETHYLENE GLYCOL 3350 17 G PO PACK
17.0000 g | PACK | Freq: Every day | ORAL | Status: DC | PRN
Start: 2015-11-01 — End: 2015-11-02

## 2015-11-01 MED ORDER — POTASSIUM CHLORIDE CRYS ER 20 MEQ PO TBCR
40.0000 meq | EXTENDED_RELEASE_TABLET | Freq: Once | ORAL | Status: AC
  Administered 2015-11-01: 40 meq via ORAL
  Filled 2015-11-01: qty 2

## 2015-11-01 MED ORDER — CETYLPYRIDINIUM CHLORIDE 0.05 % MT LIQD
7.0000 mL | Freq: Two times a day (BID) | OROMUCOSAL | Status: DC
  Administered 2015-11-01 – 2015-11-02 (×4): 7 mL via OROMUCOSAL

## 2015-11-01 MED ORDER — DOCUSATE SODIUM 100 MG PO CAPS
100.0000 mg | ORAL_CAPSULE | Freq: Two times a day (BID) | ORAL | Status: DC
  Administered 2015-11-01 – 2015-11-02 (×3): 100 mg via ORAL
  Filled 2015-11-01 (×3): qty 1

## 2015-11-01 NOTE — Care Management Note (Addendum)
Case Management Note  Patient Details  Name: Timothy Ali MRN: [id number] Date of Birth: [birth year]  Subjective/Objective:      82yo Timothy Timothy Ali was admitted 10/31/15 with PNA and respiratory failure from home where he lives alone. Chronic 3L N/C. Daughter Timothy Ali checks on him routinely and assists when necessary. Timothy Ali is currently receiving palliative chemotherapy at the [facility] for metastatic lung cancer. Hx: COPD, Asthma, Schizophrenia (per family). Discharge planning is in progress by case management. PCP=Dr Benita Stabile. Pharmacy=[facility] in [place]. Home equipment includes Ali cane and Ali rolling walker. Home oxygen is supplied by [employer]. He has Ali Chiropractor who checks on him monthly. Timothy Ali drives himself to appointment and reports that his daughter Timothy Ali drives him when he is unable to drive. Timothy Ali is his own legal guardian and reports that he will be going back to his home alone after this hospital discharge.                  Action/Plan:   Expected Discharge Date:                  Expected Discharge Plan:     In-House Referral:     Discharge planning Services     Post Acute Care Choice:    Choice offered to:     DME Arranged:    DME Agency:     HH Arranged:    HH Agency:     Status of Service:     If discussed at [employer] of Stay Meetings, dates discussed:    Additional Comments:  Timothy Gensel A, RN 11/01/2015, 1:47 PM

## 2015-11-01 NOTE — Progress Notes (Signed)
PHARMACY - PHYSICIAN COMMUNICATION CRITICAL VALUE ALERT - BLOOD CULTURE IDENTIFICATION (BCID)  Results for orders placed or performed during the hospital encounter of 10/31/15  Blood Culture ID Panel (Reflexed) (Collected: 10/31/2015  4:45 PM)  Result Value Ref Range   Enterococcus species NOT DETECTED NOT DETECTED   Vancomycin resistance NOT DETECTED NOT DETECTED   Listeria monocytogenes NOT DETECTED NOT DETECTED   Staphylococcus species DETECTED (A) NOT DETECTED   Staphylococcus aureus NOT DETECTED NOT DETECTED   Methicillin resistance NOT DETECTED NOT DETECTED   Streptococcus species NOT DETECTED NOT DETECTED   Streptococcus agalactiae NOT DETECTED NOT DETECTED   Streptococcus pneumoniae NOT DETECTED NOT DETECTED   Streptococcus pyogenes NOT DETECTED NOT DETECTED   Acinetobacter baumannii NOT DETECTED NOT DETECTED   Enterobacteriaceae species NOT DETECTED NOT DETECTED   Enterobacter cloacae complex NOT DETECTED NOT DETECTED   Escherichia coli NOT DETECTED NOT DETECTED   Klebsiella oxytoca NOT DETECTED NOT DETECTED   Klebsiella pneumoniae NOT DETECTED NOT DETECTED   Proteus species NOT DETECTED NOT DETECTED   Serratia marcescens NOT DETECTED NOT DETECTED   Carbapenem resistance NOT DETECTED NOT DETECTED   Haemophilus influenzae NOT DETECTED NOT DETECTED   Neisseria meningitidis NOT DETECTED NOT DETECTED   Pseudomonas aeruginosa NOT DETECTED NOT DETECTED   Candida albicans NOT DETECTED NOT DETECTED   Candida glabrata NOT DETECTED NOT DETECTED   Candida krusei NOT DETECTED NOT DETECTED   Candida parapsilosis NOT DETECTED NOT DETECTED   Candida tropicalis NOT DETECTED NOT DETECTED    Name of physician (or Provider) Contacted: Dr Jannifer Franklin  Changes to prescribed antibiotics required: No .   Results likely to be due to contaminant.  Will continue this pt on levaquin for PNA.   Mylan Lengyel D 11/01/2015  9:36 PM

## 2015-11-01 NOTE — Progress Notes (Signed)
[facility] [place] NAME: Timothy Ali    MR#:  [id number]  DATE OF BIRTH:  Dec 25, 1929  SUBJECTIVE:  CHIEF COMPLAINT:   Chief Complaint  Patient presents with  . Shortness of Breath   - Patient with lung cancer, admitted with worsening shortness of breath and noted to have pneumonia. -States he is feeling much better. Worried about his chemotherapy scheduled for tomorrow. -Family at bedside.  REVIEW OF SYSTEMS:  Review of Systems  Constitutional: Positive for malaise/fatigue. Negative for fever and chills.  HENT: Negative for ear discharge, ear pain and nosebleeds.   Eyes: Negative for blurred vision and double vision.  Respiratory: Positive for cough and shortness of breath. Negative for wheezing.   Cardiovascular: Negative for chest pain and palpitations.  Gastrointestinal: Negative for nausea, vomiting, abdominal pain, diarrhea and constipation.  Genitourinary: Negative for dysuria.  Musculoskeletal: Negative for myalgias.  Neurological: Negative for dizziness, sensory change, speech change, focal weakness, seizures and headaches.  Psychiatric/Behavioral: Negative for depression.    DRUG ALLERGIES:   Allergies  Allergen Reactions  . Penicillins Other (See Comments)    Reaction:  Unknown     VITALS:  Blood pressure 120/47, pulse 77, temperature 98.7 F (37.1 C), temperature source Oral, resp. rate 18, height '5\' 7"'$  (1.702 m), weight 58.968 kg (130 lb), SpO2 96 %.  PHYSICAL EXAMINATION:  Physical Exam  GENERAL:  82 y.o.-year-old patient lying in the bed with no acute distress.  EYES: Pupils equal, round, reactive to light and accommodation. No scleral icterus. Extraocular muscles intact.  HEENT: Head atraumatic, normocephalic. Oropharynx and nasopharynx clear.  NECK:  Supple, no jugular venous distention. No thyroid enlargement, no tenderness.  LUNGS: Normal breath sounds bilaterally,Slightly decreased in the left  lower lobe. no wheezing, rales,rhonchi or crepitation. No use of accessory muscles of respiration.  CARDIOVASCULAR: S1, S2 normal. No rubs, or gallops. 2/6 systolic murmur ABDOMEN: Soft, nontender, nondistended. Bowel sounds present. No organomegaly or mass.  EXTREMITIES: No pedal edema, cyanosis, or clubbing.  NEUROLOGIC: Cranial nerves II through XII are intact. Muscle strength 5/5 in all extremities. Sensation intact. Gait not checked.  PSYCHIATRIC: The patient is alert and oriented x 3.  SKIN: No obvious rash, lesion, or ulcer.    LABORATORY PANEL:   CBC  Recent Labs Lab 11/01/15 0510  WBC 7.7  HGB 9.9*  HCT 29.1*  PLT 119*   ------------------------------------------------------------------------------------------------------------------  Chemistries   Recent Labs Lab 10/31/15 1658 11/01/15 0510  NA 134* 140  K 3.7 3.3*  CL 100* 108  CO2 26 28  GLUCOSE 150* 139*  BUN 17 13  CREATININE 0.63 0.50*  CALCIUM 7.7* 8.0*  AST 18  --   ALT 11*  --   ALKPHOS 63  --   BILITOT 0.3  --    ------------------------------------------------------------------------------------------------------------------  Cardiac Enzymes  Recent Labs Lab 10/31/15 1658  TROPONINI <0.03   ------------------------------------------------------------------------------------------------------------------  RADIOLOGY:  Dg Chest Port 1 View  10/31/2015  CLINICAL DATA:  Shortness of breath and tachypnea EXAM: PORTABLE CHEST 1 VIEW COMPARISON:  10/29/2015 FINDINGS: Normal heart size. There is chronic interstitial coarsening identified throughout both lungs compatible with advanced changes of COPD. Asymmetric opacity in the left lung base is identified suspicious for superimposed infection. IMPRESSION: 1. Persistent left lung base opacity worrisome for infection. 2. Advanced changes of COPD/emphysema. 3. Followup PA and lateral chest X-ray is recommended in 3-4 weeks following trial of antibiotic  therapy to ensure resolution and exclude underlying  malignancy. Electronically Signed   By: Kerby Moors M.D.   On: 10/31/2015 17:09    EKG:   Orders placed or performed during the hospital encounter of 10/31/15  . ED EKG  . EKG 12-Lead  . EKG 12-Lead  . ED EKG    ASSESSMENT AND PLAN:   82 year old male with past medical history of lung cancer, COPD, anxiety/depression, GERD who presents to the hospital with shortness of breath.  1. Acute on chronic respiratory failure with hypoxia-this is likely secondary to underlying pneumonia seen on chest x-ray. -Continue Levaquin, continue O2 supplementation and wean off oxygen as tolerated. -off  BiPAP and back on his 3l o2  2. 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 -Progressive disease noted on recent CT scan 2 weeks ago. -Oncology consult. Due for chemotherapy tomorrow.  3. COPD-no acute exacerbations presently. -Continue Breo-Ellipita, Spiriva.   4. GERD-continue Protonix.  5. Hyperlipidemia-continue atorvastatin.  6. BPH-continue Flomax.  7. DVT prophylaxis-Lovenox   Physical therapy consult prior to discharge    All the records are reviewed and case discussed with Care Management/Social Workerr. Management plans discussed with the patient, family and they are in agreement.  CODE STATUS: Full Code  TOTAL TIME TAKING CARE OF THIS PATIENT: 37 minutes.   POSSIBLE D/C IN 2 DAYS, DEPENDING ON CLINICAL CONDITION.   Gladstone Lighter M.D on 11/01/2015 at 1:51 PM  Between 7am to 6pm - Pager - [phone]  After 6pm go to [email or web address] - password [email or web address] [place] [facility]  Office  [phone]  CC: Primary care physician; Albina Billet, MD

## 2015-11-01 NOTE — Care Management Important Message (Signed)
Important Message  Patient Details  Name: ABDUR Ali MRN: [id number] Date of Birth: January 05, 1930   Medicare Important Message Given:  Yes    Deniah Saia A, RN 11/01/2015, 3:17 PM

## 2015-11-02 ENCOUNTER — Inpatient Hospital Stay: Payer: Medicare HMO | Admitting: Internal Medicine

## 2015-11-02 ENCOUNTER — Inpatient Hospital Stay: Payer: Medicare HMO

## 2015-11-02 ENCOUNTER — Other Ambulatory Visit: Payer: Self-pay | Admitting: Internal Medicine

## 2015-11-02 LAB — URINE CULTURE: CULTURE: NO GROWTH

## 2015-11-02 LAB — BASIC METABOLIC PANEL
ANION GAP: 5 (ref 5–15)
BUN: 15 mg/dL (ref 6–20)
CALCIUM: 8.2 mg/dL — AB (ref 8.9–10.3)
CO2: 32 mmol/L (ref 22–32)
Chloride: 103 mmol/L (ref 101–111)
Creatinine, Ser: 0.61 mg/dL (ref 0.61–1.24)
Glucose, Bld: 86 mg/dL (ref 65–99)
Potassium: 4 mmol/L (ref 3.5–5.1)
Sodium: 140 mmol/L (ref 135–145)

## 2015-11-02 MED ORDER — IPRATROPIUM-ALBUTEROL 0.5-2.5 (3) MG/3ML IN SOLN: 3.0000 mL | Freq: Four times a day (QID) | RESPIRATORY_TRACT | Status: DC | PRN

## 2015-11-02 MED ORDER — DOXYCYCLINE HYCLATE 100 MG PO CAPS: 100.0000 mg | ORAL_CAPSULE | Freq: Two times a day (BID) | ORAL | Status: DC

## 2015-11-02 NOTE — Discharge Summary (Signed)
[facility] [place] NAME: Timothy Ali    MR#:  [id number]  DATE OF BIRTH:  [birth year]  DATE OF ADMISSION:  10/31/2015 ADMITTING PHYSICIAN: Henreitta Leber, MD  DATE OF DISCHARGE: 11/02/2015  2:58 PM  PRIMARY CARE PHYSICIAN: Albina Billet, MD    ADMISSION DIAGNOSIS:  Respiratory distress [R06.00] Community acquired pneumonia [J18.9]  DISCHARGE DIAGNOSIS:  Active Problems:   Acute on chronic respiratory failure with hypoxia (Timothy Ali)   Pneumonia   SECONDARY DIAGNOSIS:   Past Medical History  Diagnosis Date  . Major depressive disorder, recurrent, severe with psychotic features (Timothy Ali)   . Persistent disorder of initiating or maintaining sleep   . Generalized anxiety disorder   . Major depressive disorder, recurrent, severe with psychotic features (Timothy Ali)   . Persistent disorder of initiating or maintaining sleep   . Asthma   . COPD (chronic obstructive pulmonary disease) (Timothy Ali)   . Cancer Timothy Ali)     Left lung    Ali COURSE:   82 year old male with past medical history of lung cancer, COPD, anxiety/depression, GERD who presents to the Ali with shortness of breath.  1. Acute on chronic respiratory failure with hypoxia-this is likely secondary to underlying pneumonia seen on chest x-ray. There also be from underlying lung cancer. -Recently treated with Levaquin as outpatient 1 week ago. Received IV Levaquin in the Ali and is being discharged on oral doxycycline. -off BiPAP and back on his 3l o2  2. 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 -Progressive disease noted on recent CT scan 2 weeks ago. -Oncology consult. We will follow up as outpatient in the next 2 days. Patient was scheduled for chemotherapy originally today.  3. COPD-no acute exacerbations presently. -Continue Breo-Ellipita, Spiriva.   4. GERD-continue Protonix.  5. Hyperlipidemia-continue  atorvastatin.  6. BPH-continue Flomax.  Patient is being discharged home with home health.  DISCHARGE CONDITIONS:   Guarded  CONSULTS OBTAINED:   none  DRUG ALLERGIES:   Allergies  Allergen Reactions  . Penicillins Other (See Comments)    Reaction:  Unknown     DISCHARGE MEDICATIONS:   Discharge Medication List as of 11/02/2015  2:40 PM    START taking these medications   Details  doxycycline (VIBRAMYCIN) 100 MG capsule Take 1 capsule (100 mg total) by mouth 2 (two) times daily. X 5 more days, Starting 11/02/2015, Until Discontinued, Normal    ipratropium-albuterol (DUONEB) 0.5-2.5 (3) MG/3ML SOLN Take 3 mLs by nebulization every 6 (six) hours as needed., Starting 11/02/2015, Until Discontinued, Normal      CONTINUE these medications which have NOT CHANGED   Details  albuterol (PROVENTIL HFA;VENTOLIN HFA) 108 (90 BASE) MCG/ACT inhaler Inhale 2 puffs into the lungs every 4 (four) hours as needed for wheezing or shortness of breath. , Until Discontinued, Historical Med    atorvastatin (LIPITOR) 40 MG tablet Take 40 mg by mouth at bedtime. , Until Discontinued, Historical Med    BREO ELLIPTA 100-25 MCG/INH AEPB Inhale 1 puff into the lungs daily. , Until Discontinued, Historical Med    esomeprazole (NEXIUM) 40 MG capsule Take 40 mg by mouth daily. , Until Discontinued, Historical Med    guaiFENesin (MUCINEX) 600 MG 12 hr tablet Take 1 tablet (600 mg total) by mouth 2 (two) times daily., Starting 08/21/2015, Until Discontinued, Normal    !! LORazepam (ATIVAN) 1 MG tablet Take 1 tablet (1 mg total) by mouth 3 (three) times daily.,  Starting 10/29/2015, Until Fri 10/28/16, Print    ondansetron (ZOFRAN) 8 MG tablet Take 8 mg by mouth every 8 (eight) hours as needed for nausea. Reported on 10/19/2015, Starting 10/05/2015, Until Discontinued, Historical Med    tamsulosin (FLOMAX) 0.4 MG CAPS capsule Take 0.4 mg by mouth daily after breakfast. , Until Discontinued, Historical Med     tiotropium (SPIRIVA) 18 MCG inhalation capsule Place 18 mcg into inhaler and inhale daily. , Until Discontinued, Historical Med    !! LORazepam (ATIVAN) 1 MG tablet Take 1 tablet (1 mg total) by mouth every 8 (eight) hours as needed for anxiety., Starting 10/27/2015, Until Wed 10/26/16, Print    Melatonin 3 MG TABS Take 1 tablet (3 mg total) by mouth at bedtime., Starting 10/29/2015, Until Discontinued, Print    venlafaxine XR (EFFEXOR-XR) 150 MG 24 hr capsule Take 1 capsule (150 mg total) by mouth daily., Starting 09/21/2015, Until Discontinued, Print     !! - Potential duplicate medications found. Please discuss with provider.    STOP taking these medications     levofloxacin (LEVAQUIN) 500 MG tablet          DISCHARGE INSTRUCTIONS:   1. Oncology follow-up in 2 days for chemotherapy 2. PCP follow-up and once 2 weeks  If you experience worsening of your admission symptoms, develop shortness of breath, life threatening emergency, suicidal or homicidal thoughts you must seek medical attention immediately by calling 911 or calling your MD immediately  if symptoms less severe.  You Must read complete instructions/literature along with all the possible adverse reactions/side effects for all the Medicines you take and that have been prescribed to you. Take any new Medicines after you have completely understood and accept all the possible adverse reactions/side effects.   Please note  You were cared for by a hospitalist during your Ali stay. If you have any questions about your discharge medications or the care you received while you were in the Ali after you are discharged, you can call the unit and asked to speak with the hospitalist on call if the hospitalist that took care of you is not available. Once you are discharged, your primary care physician will handle any further medical issues. Please note that NO REFILLS for any discharge medications will be authorized once you are  discharged, as it is imperative that you return to your primary care physician (or establish a relationship with a primary care physician if you do not have one) for your aftercare needs so that they can reassess your need for medications and monitor your lab values.    Today   CHIEF COMPLAINT:   Chief Complaint  Patient presents with  . Shortness of Breath     VITAL SIGNS:  Blood pressure 128/60, pulse 105, temperature 98.1 F (36.7 C), temperature source Oral, resp. rate 20, height '5\' 7"'$  (1.702 m), weight 58.968 kg (130 lb), SpO2 93 %.  I/O:   Intake/Output Summary (Last 24 hours) at 11/02/15 1607 Last data filed at 11/01/15 1800  Gross per 24 hour  Intake    240 ml  Output      0 ml  Net    240 ml    PHYSICAL EXAMINATION:   Physical Exam  GENERAL: 82 y.o.-year-old patient lying in the bed with no acute distress.  EYES: Pupils equal, round, reactive to light and accommodation. No scleral icterus. Extraocular muscles intact.  HEENT: Head atraumatic, normocephalic. Oropharynx and nasopharynx clear.  NECK: Supple, no jugular venous distention. No thyroid  enlargement, no tenderness.  LUNGS: Scant breath sounds bilaterally, more decreased in the left lower lobe. no  rales,rhonchi or crepitation. No use of accessory muscles of respiration. Occasional scattered wheezing heard posteriorly. CARDIOVASCULAR: S1, S2 normal. No rubs, or gallops. 2/6 systolic murmur ABDOMEN: Soft, nontender, nondistended. Bowel sounds present. No organomegaly or mass.  EXTREMITIES: No pedal edema, cyanosis, or clubbing.  NEUROLOGIC: Cranial nerves II through XII are intact. Muscle strength 5/5 in all extremities. Sensation intact. Gait not checked.  PSYCHIATRIC: The patient is alert and oriented x 3.  SKIN: No obvious rash, lesion, or ulcer.   DATA REVIEW:   CBC  Recent Labs Lab 11/01/15 0510  WBC 7.7  HGB 9.9*  HCT 29.1*  PLT 119*    Chemistries   Recent Labs Lab  10/31/15 1658  11/02/15 0535  NA 134*  < > 140  K 3.7  < > 4.0  CL 100*  < > 103  CO2 26  < > 32  GLUCOSE 150*  < > 86  BUN 17  < > 15  CREATININE 0.63  < > 0.61  CALCIUM 7.7*  < > 8.2*  AST 18  --   --   ALT 11*  --   --   ALKPHOS 63  --   --   BILITOT 0.3  --   --   < > = values in this interval not displayed.  Cardiac Enzymes  Recent Labs Lab 10/31/15 1658  TROPONINI <0.03    Microbiology Results  Results for orders placed or performed during the Ali encounter of 10/31/15  Culture, blood (routine x 2)     Status: None (Preliminary result)   Collection Time: 10/31/15  4:45 PM  Result Value Ref Range Status   Specimen Description BLOOD RIGHT FOREARM  Final   Special Requests BOTTLES DRAWN AEROBIC AND ANAEROBIC  1CC  Final   Culture  Setup Time   Final    GRAM POSITIVE COCCI ANAEROBIC BOTTLE ONLY CRITICAL RESULT CALLED TO, READ BACK BY AND VERIFIED WITH: JASON ROBINS AT 2106 11/01/15 MLZ     Culture   Final    GRAM POSITIVE COCCI CULTURE REINCUBATED FOR BETTER GROWTH Performed at [facility]    Report Status PENDING  Incomplete  Blood Culture ID Panel (Reflexed)     Status: Abnormal   Collection Time: 10/31/15  4:45 PM  Result Value Ref Range Status   Enterococcus species NOT DETECTED NOT DETECTED Final   Vancomycin resistance NOT DETECTED NOT DETECTED Final   Listeria monocytogenes NOT DETECTED NOT DETECTED Final   Staphylococcus species DETECTED (A) NOT DETECTED Final    Comment: CALLED TO JASON ROBINS AT 2106 11/01/15 MLZ    Staphylococcus aureus NOT DETECTED NOT DETECTED Final   Methicillin resistance NOT DETECTED NOT DETECTED Final   Streptococcus species NOT DETECTED NOT DETECTED Final   Streptococcus agalactiae NOT DETECTED NOT DETECTED Final   Streptococcus pneumoniae NOT DETECTED NOT DETECTED Final   Streptococcus pyogenes NOT DETECTED NOT DETECTED Final   Acinetobacter baumannii NOT DETECTED NOT DETECTED Final   Enterobacteriaceae species  NOT DETECTED NOT DETECTED Final   Enterobacter cloacae complex NOT DETECTED NOT DETECTED Final   Escherichia coli NOT DETECTED NOT DETECTED Final   Klebsiella oxytoca NOT DETECTED NOT DETECTED Final   Klebsiella pneumoniae NOT DETECTED NOT DETECTED Final   Proteus species NOT DETECTED NOT DETECTED Final   Serratia marcescens NOT DETECTED NOT DETECTED Final   Carbapenem resistance NOT DETECTED NOT DETECTED  Final   Haemophilus influenzae NOT DETECTED NOT DETECTED Final   Neisseria meningitidis NOT DETECTED NOT DETECTED Final   Pseudomonas aeruginosa NOT DETECTED NOT DETECTED Final   Candida albicans NOT DETECTED NOT DETECTED Final   Candida glabrata NOT DETECTED NOT DETECTED Final   Candida krusei NOT DETECTED NOT DETECTED Final   Candida parapsilosis NOT DETECTED NOT DETECTED Final   Candida tropicalis NOT DETECTED NOT DETECTED Final  Culture, blood (routine x 2)     Status: None (Preliminary result)   Collection Time: 10/31/15  4:58 PM  Result Value Ref Range Status   Specimen Description BLOOD LEFT FOREARM  Final   Special Requests BOTTLES DRAWN AEROBIC AND ANAEROBIC  3CC  Final   Culture NO GROWTH < 24 HOURS  Final   Report Status PENDING  Incomplete  Urine culture     Status: None   Collection Time: 10/31/15  6:59 PM  Result Value Ref Range Status   Specimen Description URINE, RANDOM  Final   Special Requests NONE  Final   Culture NO GROWTH Performed at [facility]   Final   Report Status 11/02/2015 FINAL  Final    RADIOLOGY:  Dg Chest Port 1 View  10/31/2015  CLINICAL DATA:  Shortness of breath and tachypnea EXAM: PORTABLE CHEST 1 VIEW COMPARISON:  10/29/2015 FINDINGS: Normal heart size. There is chronic interstitial coarsening identified throughout both lungs compatible with advanced changes of COPD. Asymmetric opacity in the left lung base is identified suspicious for superimposed infection. IMPRESSION: 1. Persistent left lung base opacity worrisome for infection. 2.  Advanced changes of COPD/emphysema. 3. Followup PA and lateral chest X-ray is recommended in 3-4 weeks following trial of antibiotic therapy to ensure resolution and exclude underlying malignancy. Electronically Signed   By: Kerby Moors M.D.   On: 10/31/2015 17:09    EKG:   Orders placed or performed during the Ali encounter of 10/31/15  . ED EKG  . EKG 12-Lead  . EKG 12-Lead  . ED EKG      Management plans discussed with the patient, family and they are in agreement.  CODE STATUS:     Code Status Orders        Start     Ordered   10/31/15 2156  Full code   Continuous     10/31/15 2155    Code Status History    Date Active Date Inactive Code Status Order ID Comments User Context   08/19/2015  4:01 PM 08/21/2015  2:32 PM Full Code [id number]  Dustin Flock, MD ED   10/24/2014 12:48 AM 10/25/2014  5:18 PM Full Code [id number]  Juluis Mire, MD Inpatient    Advance Directive Documentation        Most Recent Value   Type of Advance Directive  Healthcare Power of Attorney   Pre-existing out of facility DNR order (yellow form or pink MOST form)     "MOST" Form in Place?        TOTAL TIME TAKING CARE OF THIS PATIENT: 37 minutes.    Gladstone Lighter M.D on 11/02/2015 at 4:07 PM  Between 7am to 6pm - Pager - [phone]  After 6pm go to [email or web address] - password [email or web address] [facility]  Office  [phone]  CC: Primary care physician; Albina Billet, MD

## 2015-11-02 NOTE — Care Management Note (Signed)
Case Management Note  Patient Details  Name: Timothy Ali MRN: [id number] Date of Birth: 11-Sep-1929  Subjective/Objective:      A nebulizer machine and HH-PT and RN was called to [name] today after [name] chose [facility] to be his home health provider from a list of local providers.             Action/Plan:   Expected Discharge Date:                  Expected Discharge Plan:     In-House Referral:     Discharge planning Services     Post Acute Care Choice:    Choice offered to:     DME Arranged:    DME Agency:     HH Arranged:    HH Agency:     Status of Service:     If discussed at [employer] of Stay Meetings, dates discussed:    Additional Comments:  Windsor Goeken A, RN 11/02/2015, 2:37 PM

## 2015-11-02 NOTE — Evaluation (Signed)
Physical Therapy Evaluation Patient Details Name: Timothy Ali MRN: [id number] DOB: Sep 16, 1929 Today's Date: 11/02/2015   History of Present Illness  Patient is an 82 yr old male with PMH of metastatic lung cancer, COPD, anxiety/depression, and GERD who presents to the hospital with shortness of breath. Admitted for acute on chronic respiratory failure and pneumonia. Of note, pt has stable mets eroding L T3 vertebrae.     Clinical Impression  Prior to admission, pt was mod I versus I with mobility and ADLs on 3L O2 (no AD for household ambulation; quad cane versus RW for limited community ambulation), though pt reports he does not move around much.  Pt lives alone in a 1-story home with a small threshold to enter. Currently, pt is mod I with sit <> stand transfers and CGA to ambulate x159f with quad cane on the R.  Pt noted to desaturate to 89% following ambulation, recovering to 94% within ~2 minutes, all on 3L O2. Pt demonstrates inconsistencies with gait and decreased knowledge of DME use (minimal carryover with education), resulting in decreased stability. Pt would benefit from skilled PT to address noted impairments and functional limitations.  Recommend pt discharge home with HHPT when medically appropriate.     Follow Up Recommendations Home health PT    Equipment Recommendations   (pt has equipment)    Recommendations for Other Services       Precautions / Restrictions Restrictions Weight Bearing Restrictions: No      Mobility  Bed Mobility General bed mobility comments: Received in chair, returned to chair; not assessed  Transfers Overall transfer level: Modified independent Equipment used:  (3L O2) General transfer comment: Performs sit <> stand transfers with no physical assist  Ambulation/Gait Ambulation/Gait assistance: Min guard (for safety) Ambulation Distance (Feet): 100 Feet Assistive device: Quad cane (on the R) Gait Pattern/deviations: Step-through  pattern;Drifts right/left;Narrow base of support;Scissoring;Step-to pattern Gait velocity: Variable   General Gait Details: Inconsistent gait pattern; fluctutating step through versus step to pattern; intermittent scissoring; poor use of quad cane with minimal carryover of PT instructions/cueing; decreased stability thoughout though no overt LOB  Stairs    Wheelchair Mobility    Modified Rankin (Stroke Patients Only)       Balance Overall balance assessment: Needs assistance Sitting-balance support: Feet supported Sitting balance-Leahy Scale: Good Sitting balance - Comments: No deficits noted   Standing balance support: Single extremity supported (on quad cane) Standing balance-Leahy Scale: Fair Standing balance comment: Dynamic standing balance concerning for instability      Pertinent Vitals/Pain Pain Assessment: 0-10 Pt with c/o intermittent R hip pain during my evaluation. He states it began over the past few days, with no known MOI. Up to 7-8/10 overnight last night. R femoral head noted to be TTP. Pain increased with R hip abduction and flexion, but no pain with [place] (standing, ambulating).     Home Living Family/patient expects to be discharged to:: Private residence Living Arrangements: Alone Available Help at Discharge: Family;Available PRN/intermittently Type of Home: House Home Access: Level entry     Home Layout: One level Home Equipment: Walker - 2 wheels;Cane - quad;Tub bench;Hand held shower head;Grab bars - toilet Additional Comments: [place] available to check/in assist intermittently    Prior Function Level of Independence: Independent;Independent with assistive device(s)  Comments: Chronic 3L O2; No AD within the home; RW versus cane for community ambulation; drives; able to perform all basic ADLs without assist     Hand Dominance  Dominant Hand: Right    Extremity/Trunk Assessment   Upper Extremity  Assessment: Overall WFL for tasks assessed   Lower Extremity Assessment: Overall WFL for tasks assessed (At least 3/5 throughout; did not resist)  Cervical / Trunk Assessment: Normal    Communication   Communication: No difficulties  Cognition Arousal/Alertness: Awake/alert Behavior During Therapy: WFL for tasks assessed/performed Overall Cognitive Status: Within Functional Limits for tasks assessed    General Comments Nursing cleared pt for participation in physical therapy.  Pt agreeable to PT session.     Exercises General Exercises - Lower Extremity Ankle Circles/Pumps: AROM; bilat Quad Sets: AROM; bilat Long Arc Quad: AROM; bilat Hip ABduction/ADduction: AROM;Left Straight Leg Raises: AROM;Left Hip Flexion/Marching: AROM; bilat All exercises performed x 10 reps in sitting/long sitting.       Assessment/Plan    PT Assessment Patient needs continued PT services  PT Diagnosis  (Impaired high level balance, decreased DME knowledge)   PT Problem List Decreased balance;Decreased knowledge of use of DME;Decreased safety awareness;Cardiopulmonary status limiting activity  PT Treatment Interventions DME instruction;Gait training;Functional mobility training;Therapeutic activities;Therapeutic exercise;Balance training;Patient/family education   PT Goals (Current goals can be found in the Care Plan section) Acute Rehab PT Goals Patient Stated Goal: To go home PT Goal Formulation: With patient Time For Goal Achievement: 11/16/15 Potential to Achieve Goals: Good    Frequency Min 2X/week   Barriers to discharge           End of Session Equipment Utilized During Treatment: Gait belt;Oxygen (3L O2) Activity Tolerance: Patient tolerated treatment well Patient left: in chair;with call bell/phone within reach (Pt received in chair without chair alarm, RN reporting pt does not need one) Nurse Communication: Mobility status         Time: [id number] PT Time Calculation  (min) (ACUTE ONLY): 31 min   Charges:         PT G Codes:        Taiya Nutting, SPT 11/02/2015, 10:56 AM

## 2015-11-04 LAB — CULTURE, BLOOD (ROUTINE X 2)

## 2015-11-05 ENCOUNTER — Inpatient Hospital Stay: Payer: Medicare HMO

## 2015-11-05 ENCOUNTER — Inpatient Hospital Stay: Payer: Medicare HMO | Admitting: Internal Medicine

## 2015-11-05 LAB — CULTURE, BLOOD (ROUTINE X 2): CULTURE: NO GROWTH

## 2015-11-12 ENCOUNTER — Inpatient Hospital Stay: Payer: Medicare HMO

## 2015-11-12 ENCOUNTER — Ambulatory Visit: Payer: Medicare HMO | Admitting: Internal Medicine

## 2015-11-12 ENCOUNTER — Inpatient Hospital Stay (HOSPITAL_BASED_OUTPATIENT_CLINIC_OR_DEPARTMENT_OTHER): Payer: Medicare HMO | Admitting: Internal Medicine

## 2015-11-12 VITALS — BP 146/66 | HR 78 | Temp 97.0°F | Resp 20

## 2015-11-12 VITALS — BP 98/56 | HR 93 | Temp 97.0°F | Resp 18 | Wt 131.1 lb

## 2015-11-12 DIAGNOSIS — R0602 Shortness of breath: Secondary | ICD-10-CM

## 2015-11-12 DIAGNOSIS — C3412 Malignant neoplasm of upper lobe, left bronchus or lung: Secondary | ICD-10-CM

## 2015-11-12 DIAGNOSIS — J449 Chronic obstructive pulmonary disease, unspecified: Secondary | ICD-10-CM | POA: Diagnosis not present

## 2015-11-12 DIAGNOSIS — Z87891 Personal history of nicotine dependence: Secondary | ICD-10-CM | POA: Diagnosis not present

## 2015-11-12 DIAGNOSIS — Z79899 Other long term (current) drug therapy: Secondary | ICD-10-CM

## 2015-11-12 DIAGNOSIS — R05 Cough: Secondary | ICD-10-CM

## 2015-11-12 DIAGNOSIS — F29 Unspecified psychosis not due to a substance or known physiological condition: Secondary | ICD-10-CM | POA: Diagnosis not present

## 2015-11-12 DIAGNOSIS — C7951 Secondary malignant neoplasm of bone: Secondary | ICD-10-CM

## 2015-11-12 DIAGNOSIS — F329 Major depressive disorder, single episode, unspecified: Secondary | ICD-10-CM | POA: Diagnosis not present

## 2015-11-12 DIAGNOSIS — C3492 Malignant neoplasm of unspecified part of left bronchus or lung: Secondary | ICD-10-CM

## 2015-11-12 LAB — CBC WITH DIFFERENTIAL/PLATELET
BASOS ABS: 0 10*3/uL (ref 0–0.1)
BASOS PCT: 1 %
EOS ABS: 0.1 10*3/uL (ref 0–0.7)
EOS PCT: 1 %
HCT: 32.4 % — ABNORMAL LOW (ref 40.0–52.0)
Hemoglobin: 10.9 g/dL — ABNORMAL LOW (ref 13.0–18.0)
Lymphocytes Relative: 12 %
Lymphs Abs: 0.9 10*3/uL — ABNORMAL LOW (ref 1.0–3.6)
MCH: 29.3 pg (ref 26.0–34.0)
MCHC: 33.6 g/dL (ref 32.0–36.0)
MCV: 87.2 fL (ref 80.0–100.0)
MONO ABS: 0.8 10*3/uL (ref 0.2–1.0)
Monocytes Relative: 10 %
Neutro Abs: 5.7 10*3/uL (ref 1.4–6.5)
Neutrophils Relative %: 76 %
PLATELETS: 229 10*3/uL (ref 150–440)
RBC: 3.71 MIL/uL — ABNORMAL LOW (ref 4.40–5.90)
RDW: 18.2 % — AB (ref 11.5–14.5)
WBC: 7.5 10*3/uL (ref 3.8–10.6)

## 2015-11-12 LAB — COMPREHENSIVE METABOLIC PANEL
ALK PHOS: 66 U/L (ref 38–126)
ALT: 10 U/L — AB (ref 17–63)
AST: 14 U/L — AB (ref 15–41)
Albumin: 3 g/dL — ABNORMAL LOW (ref 3.5–5.0)
Anion gap: 8 (ref 5–15)
BILIRUBIN TOTAL: 0.3 mg/dL (ref 0.3–1.2)
BUN: 11 mg/dL (ref 6–20)
CALCIUM: 8.5 mg/dL — AB (ref 8.9–10.3)
CO2: 32 mmol/L (ref 22–32)
CREATININE: 0.68 mg/dL (ref 0.61–1.24)
Chloride: 98 mmol/L — ABNORMAL LOW (ref 101–111)
GFR calc Af Amer: >60 mL/min (ref >60)
GLUCOSE: 100 mg/dL — AB (ref 65–99)
Potassium: 3.5 mmol/L (ref 3.5–5.1)
Sodium: 138 mmol/L (ref 135–145)
TOTAL PROTEIN: 7 g/dL (ref 6.5–8.1)

## 2015-11-12 MED ORDER — SODIUM CHLORIDE 0.9 % IV SOLN
10.0000 mg | Freq: Once | INTRAVENOUS | Status: AC
  Administered 2015-11-12: 10 mg via INTRAVENOUS
  Filled 2015-11-12: qty 1

## 2015-11-12 MED ORDER — SODIUM CHLORIDE 0.9 % IV SOLN
358.5000 mg | Freq: Once | INTRAVENOUS | Status: AC
  Administered 2015-11-12: 360 mg via INTRAVENOUS
  Filled 2015-11-12: qty 36

## 2015-11-12 MED ORDER — SODIUM CHLORIDE 0.9 % IV SOLN
Freq: Once | INTRAVENOUS | Status: AC
  Administered 2015-11-12: 12:00:00 via INTRAVENOUS
  Filled 2015-11-12: qty 1000

## 2015-11-12 MED ORDER — SODIUM CHLORIDE 0.9 % IV SOLN: 1000.0000 mg/m2 | Freq: Once | INTRAVENOUS | Status: DC

## 2015-11-12 MED ORDER — SODIUM CHLORIDE 0.9 % IV SOLN
1600.0000 mg | Freq: Once | INTRAVENOUS | Status: AC
  Administered 2015-11-12: 1600 mg via INTRAVENOUS
  Filled 2015-11-12: qty 26.3

## 2015-11-12 MED ORDER — PALONOSETRON HCL INJECTION 0.25 MG/5ML
0.2500 mg | Freq: Once | INTRAVENOUS | Status: AC
  Administered 2015-11-12: 0.25 mg via INTRAVENOUS
  Filled 2015-11-12: qty 5

## 2015-11-12 NOTE — Assessment & Plan Note (Addendum)
#   Progressive squamous cell lung cancer-based on the most recent CT scan. Proceed with [employer]- days 1, 8 q 21 days.   # cycle #1 day 3- 1 today;   # follow up with me in 3 weeks/labs/ cycle # 2.   #  I  Had along discussion with the patient and I sister- regarding te overall poor prognois;  And th risk of  Potential side effects with cmotherapy-  Including but not limited to pneumnia/ renal failure etc.  If patient has progresive side effects of the chemotherapy/ intolerance-  Of progressive disease;  I would  Recommend stopping  Chemtherapy/ recommend hospice.

## 2015-11-12 NOTE — Progress Notes (Signed)
[place] OFFICE PROGRESS NOTE  Patient Care Team: Albina Billet, MD as PCP - General (Internal Medicine) Cammie Sickle, MD as Consulting Physician (Hematology and Oncology)  Carcinoma, lung [facility])   Staging form: Lung, AJCC 7th Edition     Clinical: Stage IIIA (T4, N0, M0) - Signed by Forest Gleason, MD on 10/29/2014 Metastatic lung cancer (metastasis from lung to other site) [facility])   Staging form: Lung, AJCC 7th Edition     Clinical: No stage assigned - Unsigned    Oncology History   # LUL SQUAMOUS CELL CA [s/p Bx] STAGE I s/p SBRT [Feb 2015]  # July 2016- T4 (invading vertebral body) N0 [Stage III;NO bx;  CT/PET] s/p Chemo-RT [July 2016- Sep 4th, 2016  #NOv 2016 Progression on scan/ persistent pain- Tecentriq; Discontinued in April 2017/ Progression based on PET.   # June 29th CT- Progression;July Start Carbo-Gemcitabine q 2W     Carcinoma, lung ([place])   10/23/2014 Initial Diagnosis    Carcinoma, lung      Lung cancer ([place])   10/24/2014 Initial Diagnosis    Lung cancer      Metastatic lung cancer (metastasis from lung to other site) [facility])   02/16/2015 Initial Diagnosis    Metastatic lung cancer (metastasis from lung to other site) [facility])      Cancer of upper lobe of left lung ([place])   10/20/2015 Initial Diagnosis    Cancer of upper lobe of left lung ([place])      INTERVAL HISTORY:   Timothy Ali 82 y.o.  male pleasant patient above history of recurrent/progressive squamous cell lung cancer-  Is here to start chemotherapy.   In the interim patient was admitted to  Hospital-  Because of pneumonia.  He feels better now.   Continues to have chronic cough/ chronic  Shortness of breath-  2 L o oxygen. Clear sputum. No nausea no vomiting. Complains of fatigue. Otherwise No headaches. Positive for weight loss.   REVIEW OF SYSTEMS:  A complete 10 point review of system is done which is negative except mentioned above/history of present illness.   PAST  MEDICAL HISTORY :  Past Medical History:  Diagnosis Date  . Asthma   . Cancer ([place])    Left lung  . COPD (chronic obstructive pulmonary disease) ([place])   . Generalized anxiety disorder   . Major depressive disorder, recurrent, severe with psychotic features ([place])   . Major depressive disorder, recurrent, severe with psychotic features ([place])   . Persistent disorder of initiating or maintaining sleep   . Persistent disorder of initiating or maintaining sleep     PAST SURGICAL HISTORY :   Past Surgical History:  Procedure Laterality Date  . HEMORROIDECTOMY    . LUNG SURGERY      FAMILY HISTORY :   Family History  Problem Relation Age of Onset  . COPD Mother   . Lung cancer Father   . Kidney disease Neg Hx   . Prostate cancer Neg Hx     SOCIAL HISTORY:   Social History  Substance Use Topics  . Smoking status: Former Research scientist (life sciences)  . Smokeless tobacco: Never Used     Comment: quit 18 years ago  . Alcohol use No    ALLERGIES:  is allergic to penicillins.  MEDICATIONS:  Current Outpatient Prescriptions  Medication Sig Dispense Refill  . albuterol (PROVENTIL HFA;VENTOLIN HFA) 108 (90 BASE) MCG/ACT inhaler Inhale 2 puffs into the lungs every 4 (four) hours as needed  for wheezing or shortness of breath.     Marland Kitchen atorvastatin (LIPITOR) 40 MG tablet Take 40 mg by mouth at bedtime.     Marland Kitchen BREO ELLIPTA 100-25 MCG/INH AEPB Inhale 1 puff into the lungs daily.     Marland Kitchen esomeprazole (NEXIUM) 40 MG capsule Take 40 mg by mouth daily.     Marland Kitchen guaiFENesin (MUCINEX) 600 MG 12 hr tablet Take 1 tablet (600 mg total) by mouth 2 (two) times daily. 20 tablet 0  . ipratropium-albuterol (DUONEB) 0.5-2.5 (3) MG/3ML SOLN Take 3 mLs by nebulization every 6 (six) hours as needed. 360 mL 2  . LORazepam (ATIVAN) 1 MG tablet Take 1 tablet (1 mg total) by mouth every 8 (eight) hours as needed for anxiety. 15 tablet 0  . LORazepam (ATIVAN) 1 MG tablet Take 1 tablet (1 mg total) by mouth 3 (three) times daily. 30 tablet 0   . Melatonin 3 MG TABS Take 1 tablet (3 mg total) by mouth at bedtime. 30 tablet 0  . ondansetron (ZOFRAN) 8 MG tablet Take 8 mg by mouth every 8 (eight) hours as needed for nausea. Reported on 10/19/2015    . tamsulosin (FLOMAX) 0.4 MG CAPS capsule Take 0.4 mg by mouth daily after breakfast.     . tiotropium (SPIRIVA) 18 MCG inhalation capsule Place 18 mcg into inhaler and inhale daily.     Marland Kitchen venlafaxine XR (EFFEXOR-XR) 150 MG 24 hr capsule Take 1 capsule (150 mg total) by mouth daily. 90 capsule 1   No current facility-administered medications for this visit.     PHYSICAL EXAMINATION: ECOG PERFORMANCE STATUS: 1 - Symptomatic but completely ambulatory  BP (!) 98/56 (BP Location: Left Arm, Patient Position: Sitting)   Pulse 93   Temp 97 F (36.1 C) (Tympanic)   Resp 18   Wt 131 lb 1 oz (59.4 kg)   BMI 20.53 kg/m   Filed Weights   11/12/15 1015  Weight: 131 lb 1 oz (59.4 kg)    GENERAL: Well-nourished well-developed; Alert, no distress and comfortable. He is walking by himself. Accompanied by his sster. EYES: no pallor or icterus OROPHARYNX: no thrush or ulceration; good dentition  NECK: supple, no masses felt LYMPH:  no palpable lymphadenopathy in the cervical, axillary or inguinal regions LUNGS: Decreased breath sounds to auscultation; coarse breath sounds. HEART/CVS: regular rate & rhythm and no murmurs; No lower extremity edema ABDOMEN:abdomen soft, non-tender and normal bowel sounds Musculoskeletal:no cyanosis of digits and no clubbing  PSYCH: alert & oriented x 3 with fluent speech NEURO: no focal motor/sensory deficits SKIN:  no rashes or significant lesions  LABORATORY DATA:  I have reviewed the data as listed    Component Value Date/Time   NA 138 11/12/2015 0944   NA 141 04/09/2014 1442   K 3.5 11/12/2015 0944   K 4.2 04/09/2014 1442   CL 98 (L) 11/12/2015 0944   CL 104 04/09/2014 1442   CO2 32 11/12/2015 0944   CO2 30 04/09/2014 1442   GLUCOSE 100 (H)  11/12/2015 0944   GLUCOSE 92 04/09/2014 1442   BUN 11 11/12/2015 0944   BUN 18 04/09/2014 1442   CREATININE 0.68 11/12/2015 0944   CREATININE 0.93 04/09/2014 1442   CALCIUM 8.5 (L) 11/12/2015 0944   CALCIUM 8.2 (L) 04/09/2014 1442   PROT 7.0 11/12/2015 0944   PROT 6.5 04/09/2014 1442   ALBUMIN 3.0 (L) 11/12/2015 0944   ALBUMIN 2.4 (L) 04/09/2014 1442   AST 14 (L) 11/12/2015 0944   AST  33 04/09/2014 1442   ALT 10 (L) 11/12/2015 0944   ALT 18 04/09/2014 1442   ALKPHOS 66 11/12/2015 0944   ALKPHOS 65 04/09/2014 1442   BILITOT 0.3 11/12/2015 0944   BILITOT 0.4 04/09/2014 1442   GFRNONAA >60 11/12/2015 0944   GFRNONAA >60 04/09/2014 1442   GFRNONAA >60 12/21/2013 0852   GFRAA >60 11/12/2015 0944   GFRAA >60 04/09/2014 1442   GFRAA >60 12/21/2013 0852    No results found for: SPEP, UPEP  Lab Results  Component Value Date   WBC 7.5 11/12/2015   NEUTROABS 5.7 11/12/2015   HGB 10.9 (L) 11/12/2015   HCT 32.4 (L) 11/12/2015   MCV 87.2 11/12/2015   PLT 229 11/12/2015      Chemistry      Component Value Date/Time   NA 138 11/12/2015 0944   NA 141 04/09/2014 1442   K 3.5 11/12/2015 0944   K 4.2 04/09/2014 1442   CL 98 (L) 11/12/2015 0944   CL 104 04/09/2014 1442   CO2 32 11/12/2015 0944   CO2 30 04/09/2014 1442   BUN 11 11/12/2015 0944   BUN 18 04/09/2014 1442   CREATININE 0.68 11/12/2015 0944   CREATININE 0.93 04/09/2014 1442   GLU 9 04/02/2014      Component Value Date/Time   CALCIUM 8.5 (L) 11/12/2015 0944   CALCIUM 8.2 (L) 04/09/2014 1442   ALKPHOS 66 11/12/2015 0944   ALKPHOS 65 04/09/2014 1442   AST 14 (L) 11/12/2015 0944   AST 33 04/09/2014 1442   ALT 10 (L) 11/12/2015 0944   ALT 18 04/09/2014 1442   BILITOT 0.3 11/12/2015 0944   BILITOT 0.4 04/09/2014 1442     Mediastinum/Lymph Nodes: Aortic atherosclerosis noted. Calcification involving the RCA and the left circumflex coronary artery. No axillary or supraclavicular adenopathy. The trachea appears  patent. Normal appearance of the esophagus. There is no mediastinal or hilar adenopathy  Lungs/Pleura: With the index subpleural nodule within the posterior aspect of the superior segment of left lower lobe measures 1.1 cm, image 70 of series 3. Previously 0.8 cm. The paravertebral chest wall lesion measures 3.6 x 2.0 cm, image 28 of series 2. Previously 3.5 x 1.9 cm. There is pleural thickening and adjacent parenchymal scarring involving the posterior lateral left lower lobe, image 121 of series 3. This appears increased from previous exam. Similarly within the basilar portion of the left upper lobe there is pleural thickening and airspace consolidation containing air bronchograms, image 88 of series 3. This appears increased from previous exam. Parenchymal scarring, consolidation and architectural distortion within the posterior medial left upper lobe is new from previous exam, image 30 of series 3. New tree-in-bud nodularity within the posterior right lower lobe and posterior lateral right upper lobe noted, image 85 of series 3 and image 128 of series 3.  Upper abdomen: The adrenal glands are unremarkable. The visualized portions of the liver, kidneys and spleen are unremarkable.  Musculoskeletal: Spondylosis noted within the thoracic spine. The posterior medial left upper chest wall mass described above involves the left T3 and T for ribs and T4 vertebra.  IMPRESSION: 1. Mild increase in size of left posterior medial extra pleural chest wall lesion and left lower lobe subpleural nodule. Suspicious for progression of disease. 2. Interval development of multifocal interstitial and patchy opacities within the left upper lobe and left lower lobe which may reflect sequelae of external beam radiation. 3. Scattered tree-in-bud nodules within the posterior right lung are likely post infectious  or inflammatory in etiology.   Electronically Signed  By: Kerby Moors M.D.   On: 10/15/2015 11:11  RADIOGRAPHIC STUDIES: I have personally reviewed the radiological images as listed and agreed with the findings in the report. No results found.   ASSESSMENT & PLAN:  Cancer of upper lobe of left lung ([place]) # Progressive squamous cell lung cancer-based on the most recent CT scan. Proceed with [employer]- days 1, 8 q 21 days.   # cycle #1 day 3- 1 today;   # follow up with me in 3 weeks/labs/ cycle # 2.   #  I  Had along discussion with the patient and I sister- regarding te overall poor prognois;  And th risk of  Potential side effects with cmotherapy-  Including but not limited to pneumnia/ renal failure etc.  If patient has progresive side effects of the chemotherapy/ intolerance-  Of progressive disease;  I would  Recommend stopping  Chemtherapy/ recommend hospice.    Orders Placed This Encounter  Procedures  . CBC with Differential    Standing Status:   Future    Standing Expiration Date:   11/11/2016  . Comprehensive metabolic panel    Standing Status:   Future    Standing Expiration Date:   11/11/2016  . CBC with Differential    Standing Status:   Future    Standing Expiration Date:   11/11/2016  . Basic metabolic panel    Standing Status:   Future    Standing Expiration Date:   11/11/2016  . CBC with Differential    Standing Status:   Future    Standing Expiration Date:   11/11/2016  . Basic metabolic panel    Standing Status:   Future    Standing Expiration Date:   11/11/2016   All questions were answered. The patient knows to call the clinic with any problems, questions or concerns.      Cammie Sickle, MD 11/12/2015 6:55 PM

## 2015-11-19 ENCOUNTER — Inpatient Hospital Stay: Payer: Medicare HMO

## 2015-11-19 ENCOUNTER — Other Ambulatory Visit: Payer: Self-pay | Admitting: Internal Medicine

## 2015-11-19 ENCOUNTER — Inpatient Hospital Stay: Payer: Medicare HMO | Attending: Internal Medicine

## 2015-11-19 VITALS — BP 127/73 | HR 85 | Temp 97.0°F | Resp 20

## 2015-11-19 DIAGNOSIS — Z79899 Other long term (current) drug therapy: Secondary | ICD-10-CM | POA: Insufficient documentation

## 2015-11-19 DIAGNOSIS — C3412 Malignant neoplasm of upper lobe, left bronchus or lung: Secondary | ICD-10-CM | POA: Diagnosis not present

## 2015-11-19 DIAGNOSIS — Z87891 Personal history of nicotine dependence: Secondary | ICD-10-CM | POA: Diagnosis not present

## 2015-11-19 DIAGNOSIS — J449 Chronic obstructive pulmonary disease, unspecified: Secondary | ICD-10-CM | POA: Insufficient documentation

## 2015-11-19 DIAGNOSIS — Z5111 Encounter for antineoplastic chemotherapy: Secondary | ICD-10-CM | POA: Insufficient documentation

## 2015-11-19 DIAGNOSIS — C7800 Secondary malignant neoplasm of unspecified lung: Secondary | ICD-10-CM | POA: Insufficient documentation

## 2015-11-19 LAB — CBC WITH DIFFERENTIAL/PLATELET
BASOS ABS: 0 10*3/uL (ref 0–0.1)
EOS ABS: 0 10*3/uL (ref 0–0.7)
Eosinophils Relative: 1 %
HEMATOCRIT: 29.5 % — AB (ref 40.0–52.0)
HEMOGLOBIN: 9.8 g/dL — AB (ref 13.0–18.0)
Lymphocytes Relative: 27 %
Lymphs Abs: 1.1 10*3/uL (ref 1.0–3.6)
MCH: 29.1 pg (ref 26.0–34.0)
MCHC: 33.3 g/dL (ref 32.0–36.0)
MCV: 87.3 fL (ref 80.0–100.0)
Monocytes Absolute: 0.2 10*3/uL (ref 0.2–1.0)
NEUTROS ABS: 2.7 10*3/uL (ref 1.4–6.5)
Platelets: 90 10*3/uL — ABNORMAL LOW (ref 150–440)
RBC: 3.38 MIL/uL — ABNORMAL LOW (ref 4.40–5.90)
RDW: 18.4 % — AB (ref 11.5–14.5)
WBC: 4 10*3/uL (ref 3.8–10.6)

## 2015-11-19 LAB — BASIC METABOLIC PANEL
ANION GAP: 7 (ref 5–15)
BUN: 20 mg/dL (ref 6–20)
CALCIUM: 8.3 mg/dL — AB (ref 8.9–10.3)
CHLORIDE: 101 mmol/L (ref 101–111)
CO2: 28 mmol/L (ref 22–32)
CREATININE: 0.72 mg/dL (ref 0.61–1.24)
GFR calc non Af Amer: >60 mL/min (ref >60)
Glucose, Bld: 84 mg/dL (ref 65–99)
Potassium: 4.1 mmol/L (ref 3.5–5.1)
SODIUM: 136 mmol/L (ref 135–145)

## 2015-11-19 MED ORDER — SODIUM CHLORIDE 0.9 % IV SOLN
Freq: Once | INTRAVENOUS | Status: AC
  Administered 2015-11-19: 12:00:00 via INTRAVENOUS
  Filled 2015-11-19: qty 1000

## 2015-11-19 MED ORDER — SODIUM CHLORIDE 0.9 % IV SOLN
1600.0000 mg | Freq: Once | INTRAVENOUS | Status: AC
  Administered 2015-11-19: 1600 mg via INTRAVENOUS
  Filled 2015-11-19: qty 26.3

## 2015-11-19 MED ORDER — PROCHLORPERAZINE MALEATE 10 MG PO TABS
10.0000 mg | ORAL_TABLET | Freq: Once | ORAL | Status: AC
  Administered 2015-11-19: 10 mg via ORAL

## 2015-11-19 NOTE — Progress Notes (Signed)
Platelet count [phone] today. Per Dr Rogue Bussing proceed with gemzar treatment today

## 2015-11-24 ENCOUNTER — Telehealth: Payer: Self-pay

## 2015-11-24 NOTE — Telephone Encounter (Signed)
pt is not sleeping even thou he is on ambien, melatonin, lorazepam and effexor.  Dr. Lolita Patella would like you to change effexor to remeron

## 2015-12-03 ENCOUNTER — Inpatient Hospital Stay: Payer: Medicare HMO

## 2015-12-03 ENCOUNTER — Inpatient Hospital Stay (HOSPITAL_BASED_OUTPATIENT_CLINIC_OR_DEPARTMENT_OTHER): Payer: Medicare HMO | Admitting: Internal Medicine

## 2015-12-03 VITALS — BP 126/67 | HR 101 | Temp 97.7°F | Resp 18 | Wt 131.4 lb

## 2015-12-03 DIAGNOSIS — Z79899 Other long term (current) drug therapy: Secondary | ICD-10-CM | POA: Diagnosis not present

## 2015-12-03 DIAGNOSIS — C7800 Secondary malignant neoplasm of unspecified lung: Secondary | ICD-10-CM | POA: Diagnosis not present

## 2015-12-03 DIAGNOSIS — C3412 Malignant neoplasm of upper lobe, left bronchus or lung: Secondary | ICD-10-CM

## 2015-12-03 DIAGNOSIS — Z5111 Encounter for antineoplastic chemotherapy: Secondary | ICD-10-CM | POA: Diagnosis not present

## 2015-12-03 LAB — CBC WITH DIFFERENTIAL/PLATELET
Basophils Absolute: 0 10*3/uL (ref 0–0.1)
EOS ABS: 0.1 10*3/uL (ref 0–0.7)
Eosinophils Relative: 5 %
HCT: 28.5 % — ABNORMAL LOW (ref 40.0–52.0)
Hemoglobin: 9.7 g/dL — ABNORMAL LOW (ref 13.0–18.0)
Lymphocytes Relative: 21 %
Lymphs Abs: 0.6 10*3/uL — ABNORMAL LOW (ref 1.0–3.6)
MCH: 29.9 pg (ref 26.0–34.0)
MCHC: 34.1 g/dL (ref 32.0–36.0)
MCV: 87.6 fL (ref 80.0–100.0)
MONO ABS: 1 10*3/uL (ref 0.2–1.0)
Neutro Abs: 1.3 10*3/uL — ABNORMAL LOW (ref 1.4–6.5)
Neutrophils Relative %: 41 %
Platelets: 82 10*3/uL — ABNORMAL LOW (ref 150–440)
RBC: 3.25 MIL/uL — ABNORMAL LOW (ref 4.40–5.90)
RDW: 17.9 % — AB (ref 11.5–14.5)
WBC: 3 10*3/uL — ABNORMAL LOW (ref 3.8–10.6)

## 2015-12-03 LAB — COMPREHENSIVE METABOLIC PANEL
ALBUMIN: 3.3 g/dL — AB (ref 3.5–5.0)
ALT: 16 U/L — ABNORMAL LOW (ref 17–63)
ANION GAP: 6 (ref 5–15)
AST: 15 U/L (ref 15–41)
Alkaline Phosphatase: 66 U/L (ref 38–126)
BILIRUBIN TOTAL: 0.3 mg/dL (ref 0.3–1.2)
BUN: 17 mg/dL (ref 6–20)
CO2: 29 mmol/L (ref 22–32)
Calcium: 8.1 mg/dL — ABNORMAL LOW (ref 8.9–10.3)
Chloride: 103 mmol/L (ref 101–111)
Creatinine, Ser: 0.72 mg/dL (ref 0.61–1.24)
GFR calc Af Amer: >60 mL/min (ref >60)
GFR calc non Af Amer: >60 mL/min (ref >60)
GLUCOSE: 113 mg/dL — AB (ref 65–99)
POTASSIUM: 3.4 mmol/L — AB (ref 3.5–5.1)
SODIUM: 138 mmol/L (ref 135–145)
TOTAL PROTEIN: 6.6 g/dL (ref 6.5–8.1)

## 2015-12-03 MED ORDER — SODIUM CHLORIDE 0.9 % IV SOLN
Freq: Once | INTRAVENOUS | Status: AC
  Administered 2015-12-03: 11:00:00 via INTRAVENOUS
  Filled 2015-12-03: qty 1000

## 2015-12-03 MED ORDER — PALONOSETRON HCL INJECTION 0.25 MG/5ML
0.2500 mg | Freq: Once | INTRAVENOUS | Status: AC
  Administered 2015-12-03: 0.25 mg via INTRAVENOUS
  Filled 2015-12-03: qty 5

## 2015-12-03 MED ORDER — SODIUM CHLORIDE 0.9 % IV SOLN
10.0000 mg | Freq: Once | INTRAVENOUS | Status: AC
  Administered 2015-12-03: 10 mg via INTRAVENOUS
  Filled 2015-12-03: qty 1

## 2015-12-03 MED ORDER — SODIUM CHLORIDE 0.9 % IV SOLN
358.5000 mg | Freq: Once | INTRAVENOUS | Status: AC
  Administered 2015-12-03: 360 mg via INTRAVENOUS
  Filled 2015-12-03: qty 36

## 2015-12-03 MED ORDER — HEPARIN SOD (PORK) LOCK FLUSH 100 UNIT/ML IV SOLN: 500.0000 [IU] | Freq: Once | INTRAVENOUS | Status: DC | PRN

## 2015-12-03 MED ORDER — GEMCITABINE HCL CHEMO INJECTION 1 GM/26.3ML
1600.0000 mg | Freq: Once | INTRAVENOUS | Status: AC
  Administered 2015-12-03: 1600 mg via INTRAVENOUS
  Filled 2015-12-03: qty 26.32

## 2015-12-03 NOTE — Progress Notes (Signed)
[place] OFFICE PROGRESS NOTE  Patient Care Team: Albina Billet, MD as PCP - General (Internal Medicine) Cammie Sickle, MD as Consulting Physician (Hematology and Oncology)  Carcinoma, lung [facility])   Staging form: Lung, AJCC 7th Edition     Clinical: Stage IIIA (T4, N0, M0) - Signed by Forest Gleason, MD on 10/29/2014 Metastatic lung cancer (metastasis from lung to other site) [facility])   Staging form: Lung, AJCC 7th Edition     Clinical: No stage assigned - Unsigned    Oncology History   # LUL SQUAMOUS CELL CA [s/p Bx] STAGE I s/p SBRT [Feb 2015]  # July 2016- T4 (invading vertebral body) N0 [Stage III;NO bx;  CT/PET] s/p Chemo-RT [July 2016- Sep 4th, 2016  #NOv 2016 Progression on scan/ persistent pain- Tecentriq; Discontinued in April 2017/ Progression based on PET.   # June 29th CT- Progression;July Start Carbo-Gemcitabine q 2W     Carcinoma, lung ([place])   10/23/2014 Initial Diagnosis    Carcinoma, lung       Lung cancer ([place])   10/24/2014 Initial Diagnosis    Lung cancer       Metastatic lung cancer (metastasis from lung to other site) [facility])   02/16/2015 Initial Diagnosis    Metastatic lung cancer (metastasis from lung to other site) [facility])       Cancer of upper lobe of left lung ([place])   10/20/2015 Initial Diagnosis    Cancer of upper lobe of left lung ([place])       INTERVAL HISTORY:   Timothy Ali 82 y.o.  male pleasant patient above history of recurrent/progressive squamous cell lung cancer-  IStatus post cycle #1 of carboplatin-Gemzar.  Patient denies any significant nausea vomiting postchemotherapy. Denies any fevers. He states that overall he feels better since starting chemotherapy.   Continues to have chronic cough/ chronic  Shortness of breath-  2 L o oxygen. Clear sputum. Otherwise No headaches. Positive for weight loss.   REVIEW OF SYSTEMS:  A complete 10 point review of system is done which is negative except mentioned above/history  of present illness.   PAST MEDICAL HISTORY :  Past Medical History:  Diagnosis Date  . Asthma   . Cancer ([place])    Left lung  . COPD (chronic obstructive pulmonary disease) ([place])   . Generalized anxiety disorder   . Major depressive disorder, recurrent, severe with psychotic features ([place])   . Major depressive disorder, recurrent, severe with psychotic features ([place])   . Persistent disorder of initiating or maintaining sleep   . Persistent disorder of initiating or maintaining sleep     PAST SURGICAL HISTORY :   Past Surgical History:  Procedure Laterality Date  . HEMORROIDECTOMY    . LUNG SURGERY      FAMILY HISTORY :   Family History  Problem Relation Age of Onset  . COPD Mother   . Lung cancer Father   . Kidney disease Neg Hx   . Prostate cancer Neg Hx     SOCIAL HISTORY:   Social History  Substance Use Topics  . Smoking status: Former Research scientist (life sciences)  . Smokeless tobacco: Never Used     Comment: quit 18 years ago  . Alcohol use No    ALLERGIES:  is allergic to penicillins.  MEDICATIONS:  Current Outpatient Prescriptions  Medication Sig Dispense Refill  . albuterol (PROVENTIL HFA;VENTOLIN HFA) 108 (90 BASE) MCG/ACT inhaler Inhale 2 puffs into the lungs every 4 (four) hours as needed for  wheezing or shortness of breath.     Marland Kitchen atorvastatin (LIPITOR) 40 MG tablet Take 40 mg by mouth at bedtime.     Marland Kitchen BREO ELLIPTA 100-25 MCG/INH AEPB Inhale 1 puff into the lungs daily.     Marland Kitchen esomeprazole (NEXIUM) 40 MG capsule Take 40 mg by mouth daily.     Marland Kitchen guaiFENesin (MUCINEX) 600 MG 12 hr tablet Take 1 tablet (600 mg total) by mouth 2 (two) times daily. 20 tablet 0  . ipratropium-albuterol (DUONEB) 0.5-2.5 (3) MG/3ML SOLN Take 3 mLs by nebulization every 6 (six) hours as needed. 360 mL 2  . LORazepam (ATIVAN) 1 MG tablet Take 1 tablet (1 mg total) by mouth every 8 (eight) hours as needed for anxiety. 15 tablet 0  . LORazepam (ATIVAN) 1 MG tablet Take 1 tablet (1 mg total) by mouth 3  (three) times daily. 30 tablet 0  . Melatonin 3 MG TABS Take 1 tablet (3 mg total) by mouth at bedtime. 30 tablet 0  . ondansetron (ZOFRAN) 8 MG tablet Take 8 mg by mouth every 8 (eight) hours as needed for nausea. Reported on 10/19/2015    . tamsulosin (FLOMAX) 0.4 MG CAPS capsule Take 0.4 mg by mouth daily after breakfast.     . tiotropium (SPIRIVA) 18 MCG inhalation capsule Place 18 mcg into inhaler and inhale daily.     Marland Kitchen venlafaxine XR (EFFEXOR-XR) 150 MG 24 hr capsule Take 1 capsule (150 mg total) by mouth daily. 90 capsule 1  . levofloxacin (LEVAQUIN) 750 MG tablet     . predniSONE (DELTASONE) 20 MG tablet      No current facility-administered medications for this visit.     PHYSICAL EXAMINATION: ECOG PERFORMANCE STATUS: 1 - Symptomatic but completely ambulatory  BP 126/67 (BP Location: Left Arm, Patient Position: Sitting)   Pulse (!) 101   Temp 97.7 F (36.5 C) (Tympanic)   Resp 18   Wt 131 lb 6 oz (59.6 kg)   BMI 20.58 kg/m   Filed Weights   12/03/15 0919  Weight: 131 lb 6 oz (59.6 kg)    GENERAL: Well-nourished well-developed; Alert, no distress and comfortable. He is walking by himself. He is alone.; Wearing oxygen. EYES: no pallor or icterus OROPHARYNX: no thrush or ulceration; good dentition  NECK: supple, no masses felt LYMPH:  no palpable lymphadenopathy in the cervical, axillary or inguinal regions LUNGS: Decreased breath sounds to auscultation; coarse breath sounds. HEART/CVS: regular rate & rhythm and no murmurs; No lower extremity edema ABDOMEN:abdomen soft, non-tender and normal bowel sounds Musculoskeletal:no cyanosis of digits and no clubbing  PSYCH: alert & oriented x 3 with fluent speech NEURO: no focal motor/sensory deficits SKIN:  no rashes or significant lesions  LABORATORY DATA:  I have reviewed the data as listed    Component Value Date/Time   NA 138 12/03/2015 0842   NA 141 04/09/2014 1442   K 3.4 (L) 12/03/2015 0842   K 4.2 04/09/2014 1442    CL 103 12/03/2015 0842   CL 104 04/09/2014 1442   CO2 29 12/03/2015 0842   CO2 30 04/09/2014 1442   GLUCOSE 113 (H) 12/03/2015 0842   GLUCOSE 92 04/09/2014 1442   BUN 17 12/03/2015 0842   BUN 18 04/09/2014 1442   CREATININE 0.72 12/03/2015 0842   CREATININE 0.93 04/09/2014 1442   CALCIUM 8.1 (L) 12/03/2015 0842   CALCIUM 8.2 (L) 04/09/2014 1442   PROT 6.6 12/03/2015 0842   PROT 6.5 04/09/2014 1442   ALBUMIN 3.3 (  L) 12/03/2015 0842   ALBUMIN 2.4 (L) 04/09/2014 1442   AST 15 12/03/2015 0842   AST 33 04/09/2014 1442   ALT 16 (L) 12/03/2015 0842   ALT 18 04/09/2014 1442   ALKPHOS 66 12/03/2015 0842   ALKPHOS 65 04/09/2014 1442   BILITOT 0.3 12/03/2015 0842   BILITOT 0.4 04/09/2014 1442   GFRNONAA >60 12/03/2015 0842   GFRNONAA >60 04/09/2014 1442   GFRNONAA >60 12/21/2013 0852   GFRAA >60 12/03/2015 0842   GFRAA >60 04/09/2014 1442   GFRAA >60 12/21/2013 0852    No results found for: SPEP, UPEP  Lab Results  Component Value Date   WBC 3.0 (L) 12/03/2015   NEUTROABS 1.3 (L) 12/03/2015   HGB 9.7 (L) 12/03/2015   HCT 28.5 (L) 12/03/2015   MCV 87.6 12/03/2015   PLT 82 (L) 12/03/2015      Chemistry      Component Value Date/Time   NA 138 12/03/2015 0842   NA 141 04/09/2014 1442   K 3.4 (L) 12/03/2015 0842   K 4.2 04/09/2014 1442   CL 103 12/03/2015 0842   CL 104 04/09/2014 1442   CO2 29 12/03/2015 0842   CO2 30 04/09/2014 1442   BUN 17 12/03/2015 0842   BUN 18 04/09/2014 1442   CREATININE 0.72 12/03/2015 0842   CREATININE 0.93 04/09/2014 1442   GLU 9 04/02/2014      Component Value Date/Time   CALCIUM 8.1 (L) 12/03/2015 0842   CALCIUM 8.2 (L) 04/09/2014 1442   ALKPHOS 66 12/03/2015 0842   ALKPHOS 65 04/09/2014 1442   AST 15 12/03/2015 0842   AST 33 04/09/2014 1442   ALT 16 (L) 12/03/2015 0842   ALT 18 04/09/2014 1442   BILITOT 0.3 12/03/2015 0842   BILITOT 0.4 04/09/2014 1442     Mediastinum/Lymph Nodes: Aortic atherosclerosis noted.  Calcification involving the RCA and the left circumflex coronary artery. No axillary or supraclavicular adenopathy. The trachea appears patent. Normal appearance of the esophagus. There is no mediastinal or hilar adenopathy  Lungs/Pleura: With the index subpleural nodule within the posterior aspect of the superior segment of left lower lobe measures 1.1 cm, image 70 of series 3. Previously 0.8 cm. The paravertebral chest wall lesion measures 3.6 x 2.0 cm, image 28 of series 2. Previously 3.5 x 1.9 cm. There is pleural thickening and adjacent parenchymal scarring involving the posterior lateral left lower lobe, image 121 of series 3. This appears increased from previous exam. Similarly within the basilar portion of the left upper lobe there is pleural thickening and airspace consolidation containing air bronchograms, image 88 of series 3. This appears increased from previous exam. Parenchymal scarring, consolidation and architectural distortion within the posterior medial left upper lobe is new from previous exam, image 30 of series 3. New tree-in-bud nodularity within the posterior right lower lobe and posterior lateral right upper lobe noted, image 85 of series 3 and image 128 of series 3.  Upper abdomen: The adrenal glands are unremarkable. The visualized portions of the liver, kidneys and spleen are unremarkable.  Musculoskeletal: Spondylosis noted within the thoracic spine. The posterior medial left upper chest wall mass described above involves the left T3 and T for ribs and T4 vertebra.  IMPRESSION: 1. Mild increase in size of left posterior medial extra pleural chest wall lesion and left lower lobe subpleural nodule. Suspicious for progression of disease. 2. Interval development of multifocal interstitial and patchy opacities within the left upper lobe and left lower lobe  which may reflect sequelae of external beam radiation. 3. Scattered tree-in-bud nodules within the  posterior right lung are likely post infectious or inflammatory in etiology.   Electronically Signed  By: Kerby Moors M.D.  On: 10/15/2015 11:11  RADIOGRAPHIC STUDIES: I have personally reviewed the radiological images as listed and agreed with the findings in the report. No results found.   ASSESSMENT & PLAN:  Cancer of upper lobe of left lung ([place]) # Progressive squamous cell lung cancer-based on the most recent CT scan. On [employer]- days 1, 8 q 21 days.    # cycle #2; 1 today; ANC- 1.3; platelets- 89; continue carbo-gem today; disc day #8.    # added- Growth factor-Neulasta/On pro would be given as prophylaxis for chemotherapy-induced neutropenia to prevent febrile neutropenias.  # skin itch- no rash- recommend benadryl  # follow up with me in 3 weeks/labs/ cycle #3; check weekly labs.    Orders Placed This Encounter  Procedures  . CBC with Differential    Standing Status:   Future    Standing Expiration Date:   12/16/2016  . Basic metabolic panel    Standing Status:   Future    Standing Expiration Date:   12/16/2016  . CBC with Differential    Standing Status:   Future    Standing Expiration Date:   11/22/2016  . Comprehensive metabolic panel    Standing Status:   Future    Standing Expiration Date:   11/22/2016   All questions were answered. The patient knows to call the clinic with any problems, questions or concerns.      Cammie Sickle, MD 12/04/2015 7:39 AM

## 2015-12-03 NOTE — Assessment & Plan Note (Signed)
#   Progressive squamous cell lung cancer-based on the most recent CT scan. On [employer]- days 1, 8 q 21 days.    # cycle #2; 1 today; ANC- 1.3; platelets- 89; continue carbo-gem today; disc day #8.    # added- Growth factor-Neulasta/On pro would be given as prophylaxis for chemotherapy-induced neutropenia to prevent febrile neutropenias.  # skin itch- no rash- recommend benadryl  # follow up with me in 3 weeks/labs/ cycle #3; check weekly labs.

## 2015-12-03 NOTE — Progress Notes (Signed)
#   cycle #2; 1 today; ANC- 1.3; platelets- 89; continue carbo-gem today; disc day #8.

## 2015-12-04 ENCOUNTER — Inpatient Hospital Stay: Payer: Medicare HMO

## 2015-12-04 ENCOUNTER — Other Ambulatory Visit: Payer: Self-pay | Admitting: Internal Medicine

## 2015-12-04 DIAGNOSIS — C3492 Malignant neoplasm of unspecified part of left bronchus or lung: Secondary | ICD-10-CM

## 2015-12-04 DIAGNOSIS — C3412 Malignant neoplasm of upper lobe, left bronchus or lung: Secondary | ICD-10-CM

## 2015-12-04 DIAGNOSIS — Z5111 Encounter for antineoplastic chemotherapy: Secondary | ICD-10-CM | POA: Diagnosis not present

## 2015-12-04 MED ORDER — PEGFILGRASTIM INJECTION 6 MG/0.6ML ~~LOC~~
6.0000 mg | PREFILLED_SYRINGE | Freq: Once | SUBCUTANEOUS | Status: AC
  Administered 2015-12-04: 6 mg via SUBCUTANEOUS
  Filled 2015-12-04: qty 0.6

## 2015-12-04 NOTE — Addendum Note (Signed)
Addended by: Charlyn Minerva on: 12/04/2015 09:54 AM   Modules accepted: Orders

## 2015-12-06 ENCOUNTER — Inpatient Hospital Stay
Admission: EM | Admit: 2015-12-06 | Discharge: 2015-12-10 | DRG: 190 | Disposition: A | Discharge status: Home / Self Care | Payer: Medicare HMO | Attending: Internal Medicine | Admitting: Internal Medicine

## 2015-12-06 ENCOUNTER — Other Ambulatory Visit: Payer: Self-pay

## 2015-12-06 ENCOUNTER — Emergency Department: Payer: Medicare HMO

## 2015-12-06 DIAGNOSIS — G479 Sleep disorder, unspecified: Secondary | ICD-10-CM | POA: Diagnosis present

## 2015-12-06 DIAGNOSIS — Z87891 Personal history of nicotine dependence: Secondary | ICD-10-CM

## 2015-12-06 DIAGNOSIS — R0602 Shortness of breath: Secondary | ICD-10-CM | POA: Diagnosis not present

## 2015-12-06 DIAGNOSIS — J189 Pneumonia, unspecified organism: Secondary | ICD-10-CM | POA: Diagnosis present

## 2015-12-06 DIAGNOSIS — M25551 Pain in right hip: Secondary | ICD-10-CM | POA: Diagnosis present

## 2015-12-06 DIAGNOSIS — J9610 Chronic respiratory failure, unspecified whether with hypoxia or hypercapnia: Secondary | ICD-10-CM | POA: Diagnosis present

## 2015-12-06 DIAGNOSIS — Z7952 Long term (current) use of systemic steroids: Secondary | ICD-10-CM | POA: Diagnosis not present

## 2015-12-06 DIAGNOSIS — C3490 Malignant neoplasm of unspecified part of unspecified bronchus or lung: Secondary | ICD-10-CM | POA: Diagnosis present

## 2015-12-06 DIAGNOSIS — Z825 Family history of asthma and other chronic lower respiratory diseases: Secondary | ICD-10-CM | POA: Diagnosis not present

## 2015-12-06 DIAGNOSIS — R1031 Right lower quadrant pain: Secondary | ICD-10-CM | POA: Diagnosis present

## 2015-12-06 DIAGNOSIS — R443 Hallucinations, unspecified: Secondary | ICD-10-CM | POA: Diagnosis present

## 2015-12-06 DIAGNOSIS — C7951 Secondary malignant neoplasm of bone: Secondary | ICD-10-CM | POA: Diagnosis not present

## 2015-12-06 DIAGNOSIS — F411 Generalized anxiety disorder: Secondary | ICD-10-CM | POA: Diagnosis present

## 2015-12-06 DIAGNOSIS — T451X5A Adverse effect of antineoplastic and immunosuppressive drugs, initial encounter: Secondary | ICD-10-CM | POA: Diagnosis present

## 2015-12-06 DIAGNOSIS — N400 Enlarged prostate without lower urinary tract symptoms: Secondary | ICD-10-CM | POA: Diagnosis present

## 2015-12-06 DIAGNOSIS — Z9981 Dependence on supplemental oxygen: Secondary | ICD-10-CM

## 2015-12-06 DIAGNOSIS — D61810 Antineoplastic chemotherapy induced pancytopenia: Secondary | ICD-10-CM | POA: Diagnosis present

## 2015-12-06 DIAGNOSIS — C7800 Secondary malignant neoplasm of unspecified lung: Secondary | ICD-10-CM | POA: Diagnosis not present

## 2015-12-06 DIAGNOSIS — Y95 Nosocomial condition: Secondary | ICD-10-CM | POA: Diagnosis present

## 2015-12-06 DIAGNOSIS — Z79899 Other long term (current) drug therapy: Secondary | ICD-10-CM | POA: Diagnosis not present

## 2015-12-06 DIAGNOSIS — K219 Gastro-esophageal reflux disease without esophagitis: Secondary | ICD-10-CM | POA: Diagnosis present

## 2015-12-06 DIAGNOSIS — E440 Moderate protein-calorie malnutrition: Secondary | ICD-10-CM | POA: Diagnosis present

## 2015-12-06 DIAGNOSIS — Z792 Long term (current) use of antibiotics: Secondary | ICD-10-CM | POA: Diagnosis not present

## 2015-12-06 DIAGNOSIS — J440 Chronic obstructive pulmonary disease with acute lower respiratory infection: Principal | ICD-10-CM | POA: Diagnosis present

## 2015-12-06 DIAGNOSIS — D6481 Anemia due to antineoplastic chemotherapy: Secondary | ICD-10-CM

## 2015-12-06 DIAGNOSIS — Z801 Family history of malignant neoplasm of trachea, bronchus and lung: Secondary | ICD-10-CM

## 2015-12-06 DIAGNOSIS — F329 Major depressive disorder, single episode, unspecified: Secondary | ICD-10-CM | POA: Diagnosis present

## 2015-12-06 DIAGNOSIS — C3412 Malignant neoplasm of upper lobe, left bronchus or lung: Secondary | ICD-10-CM | POA: Diagnosis not present

## 2015-12-06 DIAGNOSIS — D649 Anemia, unspecified: Secondary | ICD-10-CM | POA: Diagnosis not present

## 2015-12-06 DIAGNOSIS — E785 Hyperlipidemia, unspecified: Secondary | ICD-10-CM | POA: Diagnosis present

## 2015-12-06 LAB — CBC WITH DIFFERENTIAL/PLATELET
BASOS ABS: 0 10*3/uL (ref 0–0.1)
BLASTS: 0 %
Band Neutrophils: 8 %
Basophils Relative: 0 %
EOS PCT: 2 %
Eosinophils Absolute: 0.1 10*3/uL (ref 0–0.7)
HEMATOCRIT: 22.8 % — AB (ref 40.0–52.0)
Hemoglobin: 7.6 g/dL — ABNORMAL LOW (ref 13.0–18.0)
Lymphocytes Relative: 9 %
Lymphs Abs: 0.3 10*3/uL — ABNORMAL LOW (ref 1.0–3.6)
MCH: 29.5 pg (ref 26.0–34.0)
MCHC: 33.5 g/dL (ref 32.0–36.0)
MCV: 88 fL (ref 80.0–100.0)
METAMYELOCYTES PCT: 1 %
MONOS PCT: 5 %
Monocytes Absolute: 0.2 10*3/uL (ref 0.2–1.0)
Myelocytes: 0 %
NEUTROS ABS: 2.9 10*3/uL (ref 1.4–6.5)
NEUTROS PCT: 75 %
NRBC: 0 /100{WBCs}
Other: 0 %
Platelets: 85 10*3/uL — ABNORMAL LOW (ref 150–440)
Promyelocytes Absolute: 0 %
RBC: 2.6 MIL/uL — AB (ref 4.40–5.90)
RDW: 18.3 % — AB (ref 11.5–14.5)
WBC: 3.5 10*3/uL — AB (ref 3.8–10.6)

## 2015-12-06 LAB — BASIC METABOLIC PANEL
ANION GAP: 4 — AB (ref 5–15)
BUN: 15 mg/dL (ref 6–20)
CO2: 29 mmol/L (ref 22–32)
Calcium: 7.6 mg/dL — ABNORMAL LOW (ref 8.9–10.3)
Chloride: 106 mmol/L (ref 101–111)
Creatinine, Ser: 0.82 mg/dL (ref 0.61–1.24)
GFR calc Af Amer: >60 mL/min (ref >60)
GFR calc non Af Amer: >60 mL/min (ref >60)
GLUCOSE: 146 mg/dL — AB (ref 65–99)
POTASSIUM: 3.5 mmol/L (ref 3.5–5.1)
Sodium: 139 mmol/L (ref 135–145)

## 2015-12-06 LAB — TROPONIN I: Troponin I: <0.03 ng/mL (ref <0.03)

## 2015-12-06 MED ORDER — LEVOFLOXACIN IN D5W 750 MG/150ML IV SOLN
750.0000 mg | Freq: Once | INTRAVENOUS | Status: AC
  Administered 2015-12-06: 750 mg via INTRAVENOUS
  Filled 2015-12-06: qty 150

## 2015-12-06 MED ORDER — BENZONATATE 100 MG PO CAPS
100.0000 mg | ORAL_CAPSULE | Freq: Three times a day (TID) | ORAL | Status: DC
  Administered 2015-12-07 – 2015-12-10 (×11): 100 mg via ORAL
  Filled 2015-12-06 (×11): qty 1

## 2015-12-06 NOTE — ED Triage Notes (Signed)
Pt to ED from home via [facility] c/o SOB. EMS states pt has a non productive and feels SOB while resting and with exertion. Pt alert and oriented, talking in complete sentences, in no acute distress at this time.

## 2015-12-06 NOTE — H&P (Signed)
[place] @ [facility] Admission History and Physical Harvie Bridge, D.O.  ---------------------------------------------------------------------------------------------------------------------   PATIENT NAME: Timothy Ali MR#: [id number] DATE OF BIRTH: November 12, 1929 DATE OF ADMISSION: 12/06/2015 PRIMARY CARE PHYSICIAN: Albina Billet, MD  REQUESTING/REFERRING PHYSICIAN: ED Dr. Archie Balboa  CHIEF COMPLAINT: Chief Complaint  Patient presents with  . Shortness of Breath    HISTORY OF PRESENT ILLNESS: Timothy Ali is a 82 y.o. male with a known history of Left lung cancer, COPD, asthma, depression and anxiety was in a usual state of health until increasing shortness of breath over the past day. He reports that he has had a cough productive of yellowish to white sputum, generalized fatigue, weakness, decreased appetite. He states that he had chemotherapy last week on Thursday and received a shot for anemia on Friday.  Of note he was hospitalized on July 15 for acute on chronic respiratory failure with hypoxia secondary to community-acquired pneumonia  Otherwise there has been no change in status. Patient has been taking medication as prescribed and there has been no recent change in medication or diet.  There has been no recent illness, travel or sick contacts.    Patient denies dizziness, chest pain,  N/V/C/D, abdominal pain, dysuria/frequency, changes in mental status.   EMS/ED COURSE:  A she received Levaquin and IV fluids PAST MEDICAL HISTORY: Past Medical History:  Diagnosis Date  . Asthma   . Cancer ([place])    Left lung  . COPD (chronic obstructive pulmonary disease) ([place])   . Generalized anxiety disorder   . Major depressive disorder, recurrent, severe with psychotic features ([place])   . Major depressive disorder, recurrent, severe with psychotic features ([place])   . Persistent disorder of initiating or maintaining sleep   . Persistent disorder of initiating or  maintaining sleep       PAST SURGICAL HISTORY: Past Surgical History:  Procedure Laterality Date  . HEMORROIDECTOMY    . LUNG SURGERY        SOCIAL HISTORY: Social History  Substance Use Topics  . Smoking status: Former Research scientist (life sciences)  . Smokeless tobacco: Never Used     Comment: quit 18 years ago  . Alcohol use No      FAMILY HISTORY: Family History  Problem Relation Age of Onset  . COPD Mother   . Lung cancer Father   . Kidney disease Neg Hx   . Prostate cancer Neg Hx      MEDICATIONS AT HOME: Prior to Admission medications   Medication Sig Start Date End Date Taking? Authorizing Provider  albuterol (PROVENTIL HFA;VENTOLIN HFA) 108 (90 BASE) MCG/ACT inhaler Inhale 2 puffs into the lungs every 4 (four) hours as needed for wheezing or shortness of breath.    Yes Historical Provider, MD  atorvastatin (LIPITOR) 40 MG tablet Take 40 mg by mouth at bedtime.    Yes Historical Provider, MD  BREO ELLIPTA 100-25 MCG/INH AEPB Inhale 1 puff into the lungs daily.    Yes Historical Provider, MD  esomeprazole (NEXIUM) 40 MG capsule Take 40 mg by mouth daily.    Yes Historical Provider, MD  guaiFENesin (MUCINEX) 600 MG 12 hr tablet Take 1 tablet (600 mg total) by mouth 2 (two) times daily. 08/21/15  Yes Dustin Flock, MD  LORazepam (ATIVAN) 1 MG tablet Take 1 tablet (1 mg total) by mouth every 8 (eight) hours as needed for anxiety. 10/27/15 10/26/16 Yes Delman Kitten, MD  tamsulosin (FLOMAX) 0.4 MG CAPS capsule Take 0.4 mg by mouth daily after breakfast.  Yes Historical Provider, MD  tiotropium (SPIRIVA) 18 MCG inhalation capsule Place 18 mcg into inhaler and inhale daily.    Yes Historical Provider, MD  levofloxacin (LEVAQUIN) 750 MG tablet Take 750 mg by mouth daily.  11/24/15   Historical Provider, MD  ondansetron (ZOFRAN) 8 MG tablet Take 8 mg by mouth every 8 (eight) hours as needed for nausea. Reported on 10/19/2015 10/05/15   Historical Provider, MD  predniSONE (DELTASONE) 20 MG tablet Take 20  mg by mouth.  11/24/15   Historical Provider, MD      DRUG ALLERGIES: Allergies  Allergen Reactions  . Penicillins Other (See Comments)    Reaction:  Unknown      REVIEW OF SYSTEMS: CONSTITUTIONAL: No fever/chills, fatigue, weakness, weight gain/loss, headache EYES: No blurry or double vision. ENT: No tinnitus, postnasal drip, redness or soreness of the oropharynx. RESPIRATORY: No cough, wheeze, hemoptysis, dyspnea. CARDIOVASCULAR: No chest pain, orthopnea, palpitations, syncope. GASTROINTESTINAL: No nausea, vomiting, constipation, diarrhea, abdominal pain, hematemesis, melena or hematochezia. GENITOURINARY: No dysuria or hematuria. ENDOCRINE: No polyuria or nocturia. No heat or cold intolerance. HEMATOLOGY: No anemia, bruising, bleeding. INTEGUMENTARY: No rashes, ulcers, lesions. MUSCULOSKELETAL: No arthritis, swelling, gout. NEUROLOGIC: No numbness, tingling, weakness or ataxia. No seizure-type activity. PSYCHIATRIC: No anxiety, depression, insomnia.  PHYSICAL EXAMINATION: VITAL SIGNS: Blood pressure (!) 114/56, pulse 83, temperature 98.2 F (36.8 C), temperature source Oral, resp. rate 17, height '5\' 7"'$  (1.702 m), weight 60.3 kg (133 lb), SpO2 97 %.  GENERAL: 82 y.o.-year-old elderly white male patient, frail, chronically ill-appearing, lying in the bed in no acute distress.  Pleasant and cooperative.   HEENT: Head atraumatic, normocephalic. Pupils equal, round, reactive to light and accommodation. No scleral icterus. Extraocular muscles intact. Nares are patent. Oropharynx is clear. Mucus membranes moist. NECK: Supple, full range of motion. No JVD, no bruit heard. No thyroid enlargement, no tenderness, no cervical lymphadenopathy. CHEST: Normal breath sounds bilaterally. No wheezing, rales, rhonchi or crackles. No use of accessory muscles of respiration.  No reproducible chest wall tenderness.  CARDIOVASCULAR: S1, S2 normal. No murmurs, rubs, or gallops. Cap refill <2  seconds. ABDOMEN: Soft, nontender, nondistended. No rebound, guarding, rigidity. Normoactive bowel sounds present in all four quadrants. No organomegaly or mass. EXTREMITIES: Full range of motion. No pedal edema, cyanosis, or clubbing. NEUROLOGIC: Cranial nerves II through XII are grossly intact with no focal sensorimotor deficit. Muscle strength 5/5 in all extremities. Sensation intact. Gait not checked. PSYCHIATRIC: The patient is alert and oriented x 3. Normal affect, mood, thought content. SKIN: Warm, dry, and intact without obvious rash, lesion, or ulcer.  LABORATORY PANEL:  CBC  Recent Labs Lab 12/06/15 2010  WBC 3.5*  HGB 7.6*  HCT 22.8*  PLT 85*   ----------------------------------------------------------------------------------------------------------------- Chemistries  Recent Labs Lab 12/03/15 0842 12/06/15 2010  NA 138 139  K 3.4* 3.5  CL 103 106  CO2 29 29  GLUCOSE 113* 146*  BUN 17 15  CREATININE 0.72 0.82  CALCIUM 8.1* 7.6*  AST 15  --   ALT 16*  --   ALKPHOS 66  --   BILITOT 0.3  --    ------------------------------------------------------------------------------------------------------------------ Cardiac Enzymes  Recent Labs Lab 12/06/15 2010  TROPONINI <0.03   ------------------------------------------------------------------------------------------------------------------  RADIOLOGY: Dg Chest 2 View  Result Date: 12/06/2015 CLINICAL DATA:  Shortness of breath EXAM: CHEST  2 VIEW COMPARISON:  10/31/2015 FINDINGS: COPD with diffuse airway thickening, emphysematous change and hyperinflation. Subtle patchy right upper lobe density. No edema or effusion. No pneumothorax. Normal heart  size and aortic contours. Spondyloarthropathy with diffuse thoracic ankylosis. IMPRESSION: 1. Possible right upper lobe bronchopneumonia. Followup PA and lateral chest X-ray is recommended in 3-4 weeks following trial of antibiotic therapy to ensure resolution. 2.   Emphysema. (ICD10-J43.9) and chronic bronchitis. 3. Spondyloarthropathy and diffuse thoracic ankylosis. Electronically Signed   By: Monte Fantasia M.D.   On: 12/06/2015 20:49    EKG: Normal sinus at 83 bpm with several PACs nonspecific ST and T wave changes.  IMPRESSION AND PLAN:  This is a 82 y.o. male with a history of lung cancer, COPD, anxiety and depression  now being admitted with: 1. Healthcare associated pneumonia in an immunocompromised patient-patient is not neutropenic however we will admit for IV fluids, IV antibiotics Levaquin, sputum cultures, nebs, O2 and expectorants.  2. Pancytopenia-likely secondary to chemotherapy. We will monitor CBC in a.m. and transfuse as needed  3. Continue home medications  Diet/Nutrition: Heart healthy  Fluids: IV normal saline  DVT Px: Lovenox, SCDs and early ambulation Code Status: Patient states that he is a DO NOT RESUSCITATE only and brings with him on nonhospital DO NOT RESUSCITATE form. He states that should he need intubation for reversible condition such as pneumonia that he would be amenable to that. However he does not want CPR or defibrillation.  All the records are reviewed and case discussed with ED provider. Management plans discussed with the patient and/or family who express understanding and agree with plan of care.   TOTAL TIME TAKING CARE OF THIS PATIENT: 60 minutes.   Timothy Ali D.O. on 12/07/2015 at 12:06 AM Between 7am to 6pm - Pager - [phone] After 6pm go to [email or web address] - [employer] [facility] [place] [facility] Office [phone] CC: Primary care physician; Albina Billet, MD     Note: This dictation was prepared with Dragon dictation along with smaller phrase technology. Any transcriptional errors that result from this process are unintentional.

## 2015-12-06 NOTE — ED Notes (Signed)
Pt denies needs at this time. Report from jerri, rn at 2302. Call bell provided to pt at this time. levaquin infusing without difficulty, pt denies pain.

## 2015-12-06 NOTE — ED Provider Notes (Signed)
[facility] Emergency Department Provider Note    ____________________________________________   I have reviewed the triage vital signs and the nursing notes.   HISTORY  Chief Complaint Shortness of Breath   History limited by: Not Limited   HPI Timothy Ali is a 82 y.o. male with history of lung cancer who presents to the emergency department today because of concern for increasing shortness of breath and possible pneumonia. The patient started feeling more short of breath this afternoon. It has been accompanied by a cough productive of yellow phlegm. He has not had any associated chest pain. He denies any fevers.      Past Medical History:  Diagnosis Date  . Asthma   . Cancer ([place])    Left lung  . COPD (chronic obstructive pulmonary disease) ([place])   . Generalized anxiety disorder   . Major depressive disorder, recurrent, severe with psychotic features ([place])   . Major depressive disorder, recurrent, severe with psychotic features ([place])   . Persistent disorder of initiating or maintaining sleep   . Persistent disorder of initiating or maintaining sleep     Patient Active Problem List   Diagnosis Date Noted  . Acute on chronic respiratory failure with hypoxia ([place]) 10/31/2015  . Pneumonia 10/31/2015  . Cancer of upper lobe of left lung ([place]) 10/20/2015  . Dementia 08/31/2015  . Generalized anxiety disorder 08/31/2015  . SOB (shortness of breath) 08/19/2015  . BPH (benign prostatic hypertrophy) with urinary retention 02/16/2015  . Metastatic lung cancer (metastasis from lung to other site) ([place]) 02/16/2015  . Lung cancer ([place]) 10/24/2014  . Acute on chronic respiratory failure ([place]) 10/23/2014  . Leucocytosis 10/23/2014  . COPD (chronic obstructive pulmonary disease) ([place]) 10/23/2014  . Carcinoma, lung ([place]) 10/23/2014  . Cancer of lung ([place]) 10/23/2014  . CAFL (chronic airflow limitation) ([place]) 08/01/2014  . Depression, major, recurrent,  severe with psychosis ([place]) 08/01/2014  . H/O gastrointestinal disease 08/01/2014  . Anxiety, generalized 08/01/2014  . H/O elevated lipids 08/01/2014  . Insomnia, persistent 08/01/2014  . H/O malignant neoplasm 08/01/2014  . Prostate disease 08/01/2014  . Macular degeneration 08/01/2014  . Obstruction of urinary tract 08/01/2014  . Chronic obstructive pulmonary disease ([place]) 08/01/2014  . Severe episode of recurrent major depressive disorder, with psychotic features ([place]) 08/01/2014    Past Surgical History:  Procedure Laterality Date  . HEMORROIDECTOMY    . LUNG SURGERY      Prior to Admission medications   Medication Sig Start Date End Date Taking? Authorizing Provider  albuterol (PROVENTIL HFA;VENTOLIN HFA) 108 (90 BASE) MCG/ACT inhaler Inhale 2 puffs into the lungs every 4 (four) hours as needed for wheezing or shortness of breath.     Historical Provider, MD  atorvastatin (LIPITOR) 40 MG tablet Take 40 mg by mouth at bedtime.     Historical Provider, MD  BREO ELLIPTA 100-25 MCG/INH AEPB Inhale 1 puff into the lungs daily.     Historical Provider, MD  esomeprazole (NEXIUM) 40 MG capsule Take 40 mg by mouth daily.     Historical Provider, MD  guaiFENesin (MUCINEX) 600 MG 12 hr tablet Take 1 tablet (600 mg total) by mouth 2 (two) times daily. 08/21/15   Dustin Flock, MD  ipratropium-albuterol (DUONEB) 0.5-2.5 (3) MG/3ML SOLN Take 3 mLs by nebulization every 6 (six) hours as needed. 11/02/15   Gladstone Lighter, MD  levofloxacin (LEVAQUIN) 750 MG tablet  11/24/15   Historical Provider, MD  LORazepam (ATIVAN) 1 MG tablet Take 1  tablet (1 mg total) by mouth every 8 (eight) hours as needed for anxiety. 10/27/15 10/26/16  Delman Kitten, MD  LORazepam (ATIVAN) 1 MG tablet Take 1 tablet (1 mg total) by mouth 3 (three) times daily. 10/29/15 10/28/16  Earleen Newport, MD  Melatonin 3 MG TABS Take 1 tablet (3 mg total) by mouth at bedtime. 10/29/15   Earleen Newport, MD  ondansetron (ZOFRAN) 8  MG tablet Take 8 mg by mouth every 8 (eight) hours as needed for nausea. Reported on 10/19/2015 10/05/15   Historical Provider, MD  predniSONE (DELTASONE) 20 MG tablet  11/24/15   Historical Provider, MD  tamsulosin (FLOMAX) 0.4 MG CAPS capsule Take 0.4 mg by mouth daily after breakfast.     Historical Provider, MD  tiotropium (SPIRIVA) 18 MCG inhalation capsule Place 18 mcg into inhaler and inhale daily.     Historical Provider, MD  venlafaxine XR (EFFEXOR-XR) 150 MG 24 hr capsule Take 1 capsule (150 mg total) by mouth daily. 09/21/15   Himabindu Einar Grad, MD    Allergies Penicillins  Family History  Problem Relation Age of Onset  . COPD Mother   . Lung cancer Father   . Kidney disease Neg Hx   . Prostate cancer Neg Hx     Social History Social History  Substance Use Topics  . Smoking status: Former Research scientist (life sciences)  . Smokeless tobacco: Never Used     Comment: quit 18 years ago  . Alcohol use No    Review of Systems  Constitutional: Negative for fever. Cardiovascular: Negative for chest pain. Respiratory: Positive for shortness of breath. Gastrointestinal: Negative for abdominal pain, vomiting and diarrhea. Genitourinary: Negative for dysuria. Musculoskeletal: Negative for back pain. Skin: Negative for rash. Neurological: Negative for headaches, focal weakness or numbness.  10-point ROS otherwise negative.  ____________________________________________   PHYSICAL EXAM:  VITAL SIGNS: ED Triage Vitals  Enc Vitals Group     BP 12/06/15 1921 (!) 124/58     Pulse Rate 12/06/15 1921 87     Resp 12/06/15 1921 19     Temp 12/06/15 1921 98.2 F (36.8 C)     Temp Source 12/06/15 1921 Oral     SpO2 12/06/15 1921 94 %     Weight 12/06/15 1922 133 lb (60.3 kg)     Height 12/06/15 1922 '5\' 7"'$  (1.702 m)     Head Circumference --      Peak Flow --      Pain Score 12/06/15 1922 7   Constitutional: Alert and oriented. Well appearing and in no distress. Eyes: Conjunctivae are normal. PERRL.  Normal extraocular movements. ENT   Head: Normocephalic and atraumatic.   Nose: No congestion/rhinnorhea.   Mouth/Throat: Mucous membranes are moist.   Neck: No stridor. Hematological/Lymphatic/Immunilogical: No cervical lymphadenopathy. Cardiovascular: Normal rate, regular rhythm.  No murmurs, rubs, or gallops. Respiratory: Normal respiratory effort without tachypnea nor retractions. Breath sounds are clear and equal bilaterally. No wheezes/rales/rhonchi. Gastrointestinal: Soft and nontender. No distention.  Genitourinary: Deferred Musculoskeletal: Normal range of motion in all extremities. No joint effusions.  No lower extremity tenderness nor edema. Neurologic:  Normal speech and language. No gross focal neurologic deficits are appreciated.  Skin:  Skin is warm, dry and intact. No rash noted. Psychiatric: Mood and affect are normal. Speech and behavior are normal. Patient exhibits appropriate insight and judgment.  ____________________________________________    LABS (pertinent positives/negatives)  Labs Reviewed  CBC WITH DIFFERENTIAL/PLATELET - Abnormal; Notable for the following:  Result Value   WBC 3.5 (*)    RBC 2.60 (*)    Hemoglobin 7.6 (*)    HCT 22.8 (*)    RDW 18.3 (*)    Platelets 85 (*)    Lymphs Abs 0.3 (*)    All other components within normal limits  BASIC METABOLIC PANEL - Abnormal; Notable for the following:    Glucose, Bld 146 (*)    Calcium 7.6 (*)    Anion gap 4 (*)    All other components within normal limits  CULTURE, BLOOD (ROUTINE X 2)  CULTURE, BLOOD (ROUTINE X 2)  TROPONIN I     ____________________________________________   EKG  I, Nance Pear, attending physician, personally viewed and interpreted this EKG  EKG Time: 1924 Rate: 83 Rhythm: normal sinus rhythm with PACs Axis: normal Intervals: qtc 445 QRS: narrow ST changes: no st elevation Impression: normal  ekg   ____________________________________________    RADIOLOGY  CXR IMPRESSION:  1. Possible right upper lobe bronchopneumonia. Followup PA and  lateral chest X-ray is recommended in 3-4 weeks following trial of  antibiotic therapy to ensure resolution.  2. Emphysema. (ICD10-J43.9) and chronic bronchitis.  3. Spondyloarthropathy and diffuse thoracic ankylosis.        ____________________________________________   PROCEDURES  Procedures  ____________________________________________   INITIAL IMPRESSION / ASSESSMENT AND PLAN / ED COURSE  Pertinent labs & imaging results that were available during my care of the patient were reviewed by me and considered in my medical decision making (see chart for details).  Patient with history of lung cancer who presents with increasing shortness of breath and concern that he may have pneumonia. The patient does not appear toxic on exam. No respiratory distress. Will check blood work and CXR.  Clinical Course   CXR consistent with pneumonia. Will give abx and admit to hospitalist service.  ____________________________________________   FINAL CLINICAL IMPRESSION(S) / ED DIAGNOSES  Final diagnoses:  Community acquired pneumonia     Note: This dictation was prepared with Sales executive. Any transcriptional errors that result from this process are unintentional    Nance Pear, MD 12/07/15 0007

## 2015-12-07 ENCOUNTER — Inpatient Hospital Stay: Payer: Medicare HMO

## 2015-12-07 DIAGNOSIS — R05 Cough: Secondary | ICD-10-CM

## 2015-12-07 DIAGNOSIS — T451X5A Adverse effect of antineoplastic and immunosuppressive drugs, initial encounter: Secondary | ICD-10-CM

## 2015-12-07 DIAGNOSIS — C3412 Malignant neoplasm of upper lobe, left bronchus or lung: Secondary | ICD-10-CM

## 2015-12-07 DIAGNOSIS — D6481 Anemia due to antineoplastic chemotherapy: Secondary | ICD-10-CM

## 2015-12-07 DIAGNOSIS — C7951 Secondary malignant neoplasm of bone: Secondary | ICD-10-CM

## 2015-12-07 DIAGNOSIS — R630 Anorexia: Secondary | ICD-10-CM

## 2015-12-07 DIAGNOSIS — J189 Pneumonia, unspecified organism: Secondary | ICD-10-CM

## 2015-12-07 DIAGNOSIS — R0602 Shortness of breath: Secondary | ICD-10-CM

## 2015-12-07 LAB — BASIC METABOLIC PANEL
ANION GAP: 2 — AB (ref 5–15)
BUN: 14 mg/dL (ref 6–20)
CHLORIDE: 106 mmol/L (ref 101–111)
CO2: 31 mmol/L (ref 22–32)
CREATININE: 0.56 mg/dL — AB (ref 0.61–1.24)
Calcium: 7.6 mg/dL — ABNORMAL LOW (ref 8.9–10.3)
GFR calc non Af Amer: >60 mL/min (ref >60)
Glucose, Bld: 99 mg/dL (ref 65–99)
POTASSIUM: 4.1 mmol/L (ref 3.5–5.1)
SODIUM: 139 mmol/L (ref 135–145)

## 2015-12-07 LAB — CBC
HEMATOCRIT: 23.3 % — AB (ref 40.0–52.0)
HEMOGLOBIN: 7.8 g/dL — AB (ref 13.0–18.0)
MCH: 30 pg (ref 26.0–34.0)
MCHC: 33.7 g/dL (ref 32.0–36.0)
MCV: 89 fL (ref 80.0–100.0)
PLATELETS: 84 10*3/uL — AB (ref 150–440)
RBC: 2.62 MIL/uL — AB (ref 4.40–5.90)
RDW: 18.3 % — ABNORMAL HIGH (ref 11.5–14.5)
WBC: 4 10*3/uL (ref 3.8–10.6)

## 2015-12-07 LAB — PREPARE RBC (CROSSMATCH)

## 2015-12-07 LAB — ABO/RH: ABO/RH(D): O NEG

## 2015-12-07 MED ORDER — ONDANSETRON HCL 4 MG PO TABS: 8.0000 mg | ORAL_TABLET | Freq: Three times a day (TID) | ORAL | Status: DC | PRN

## 2015-12-07 MED ORDER — MAGNESIUM CITRATE PO SOLN
1.0000 | Freq: Once | ORAL | Status: AC | PRN
  Administered 2015-12-10: 1 via ORAL
  Filled 2015-12-07 (×2): qty 296

## 2015-12-07 MED ORDER — FLUTICASONE FUROATE-VILANTEROL 100-25 MCG/INH IN AEPB
1.0000 | INHALATION_SPRAY | Freq: Every day | RESPIRATORY_TRACT | Status: DC
  Filled 2015-12-07: qty 28

## 2015-12-07 MED ORDER — SODIUM CHLORIDE 0.9 % IV SOLN
250.0000 mL | Freq: Once | INTRAVENOUS | Status: AC
  Administered 2015-12-07: 14:00:00 250 mL via INTRAVENOUS

## 2015-12-07 MED ORDER — SENNOSIDES-DOCUSATE SODIUM 8.6-50 MG PO TABS: 1.0000 | ORAL_TABLET | Freq: Every evening | ORAL | Status: DC | PRN

## 2015-12-07 MED ORDER — ENOXAPARIN SODIUM 40 MG/0.4ML ~~LOC~~ SOLN
40.0000 mg | SUBCUTANEOUS | Status: DC
  Administered 2015-12-07 – 2015-12-09 (×4): 40 mg via SUBCUTANEOUS
  Filled 2015-12-07 (×4): qty 0.4

## 2015-12-07 MED ORDER — ATORVASTATIN CALCIUM 20 MG PO TABS
40.0000 mg | ORAL_TABLET | Freq: Every day | ORAL | Status: DC
  Administered 2015-12-07 – 2015-12-09 (×4): 40 mg via ORAL
  Filled 2015-12-07 (×4): qty 2

## 2015-12-07 MED ORDER — SODIUM CHLORIDE 0.9 % IV SOLN
INTRAVENOUS | Status: DC
  Administered 2015-12-07: 01:00:00 via INTRAVENOUS

## 2015-12-07 MED ORDER — LEVOFLOXACIN IN D5W 750 MG/150ML IV SOLN
750.0000 mg | INTRAVENOUS | Status: DC
  Administered 2015-12-07 – 2015-12-09 (×3): 750 mg via INTRAVENOUS
  Filled 2015-12-07 (×4): qty 150

## 2015-12-07 MED ORDER — PANTOPRAZOLE SODIUM 40 MG PO TBEC
40.0000 mg | DELAYED_RELEASE_TABLET | Freq: Every day | ORAL | Status: DC
  Administered 2015-12-07 – 2015-12-10 (×4): 40 mg via ORAL
  Filled 2015-12-07 (×4): qty 1

## 2015-12-07 MED ORDER — DIPHENHYDRAMINE HCL 25 MG PO CAPS
25.0000 mg | ORAL_CAPSULE | Freq: Once | ORAL | Status: AC
  Administered 2015-12-07: 14:00:00 25 mg via ORAL
  Filled 2015-12-07: qty 1

## 2015-12-07 MED ORDER — HYDROCODONE-ACETAMINOPHEN 5-325 MG PO TABS
1.0000 | ORAL_TABLET | ORAL | Status: DC | PRN
  Administered 2015-12-07 – 2015-12-09 (×5): 2 via ORAL
  Filled 2015-12-07 (×5): qty 2

## 2015-12-07 MED ORDER — SODIUM CHLORIDE 0.9% FLUSH: 3.0000 mL | INTRAVENOUS | Status: DC | PRN

## 2015-12-07 MED ORDER — HEPARIN SOD (PORK) LOCK FLUSH 100 UNIT/ML IV SOLN: 500.0000 [IU] | Freq: Every day | INTRAVENOUS | Status: DC | PRN

## 2015-12-07 MED ORDER — ACETAMINOPHEN 650 MG RE SUPP: 650.0000 mg | Freq: Four times a day (QID) | RECTAL | Status: DC | PRN

## 2015-12-07 MED ORDER — BISACODYL 5 MG PO TBEC
5.0000 mg | DELAYED_RELEASE_TABLET | Freq: Every day | ORAL | Status: DC | PRN
  Administered 2015-12-08 – 2015-12-10 (×2): 5 mg via ORAL
  Filled 2015-12-07 (×2): qty 1

## 2015-12-07 MED ORDER — GUAIFENESIN ER 600 MG PO TB12
600.0000 mg | ORAL_TABLET | Freq: Two times a day (BID) | ORAL | Status: DC
  Administered 2015-12-07 – 2015-12-10 (×8): 600 mg via ORAL
  Filled 2015-12-07 (×8): qty 1

## 2015-12-07 MED ORDER — LORAZEPAM 1 MG PO TABS
1.0000 mg | ORAL_TABLET | Freq: Three times a day (TID) | ORAL | Status: DC | PRN
  Administered 2015-12-07 – 2015-12-10 (×9): 1 mg via ORAL
  Filled 2015-12-07 (×9): qty 1

## 2015-12-07 MED ORDER — SODIUM CHLORIDE 0.9% FLUSH: 10.0000 mL | INTRAVENOUS | Status: DC | PRN

## 2015-12-07 MED ORDER — TAMSULOSIN HCL 0.4 MG PO CAPS
0.4000 mg | ORAL_CAPSULE | Freq: Every day | ORAL | Status: DC
  Administered 2015-12-07 – 2015-12-10 (×4): 0.4 mg via ORAL
  Filled 2015-12-07 (×4): qty 1

## 2015-12-07 MED ORDER — HEPARIN SOD (PORK) LOCK FLUSH 100 UNIT/ML IV SOLN: 250.0000 [IU] | INTRAVENOUS | Status: DC | PRN

## 2015-12-07 MED ORDER — ACETAMINOPHEN 325 MG PO TABS: 650.0000 mg | ORAL_TABLET | Freq: Four times a day (QID) | ORAL | Status: DC | PRN

## 2015-12-07 MED ORDER — ALBUTEROL SULFATE (2.5 MG/3ML) 0.083% IN NEBU: 2.5000 mg | INHALATION_SOLUTION | Freq: Four times a day (QID) | RESPIRATORY_TRACT | Status: DC | PRN

## 2015-12-07 MED ORDER — TIOTROPIUM BROMIDE MONOHYDRATE 18 MCG IN CAPS
18.0000 ug | ORAL_CAPSULE | Freq: Every day | RESPIRATORY_TRACT | Status: DC
  Administered 2015-12-07 – 2015-12-10 (×4): 18 ug via RESPIRATORY_TRACT
  Filled 2015-12-07: qty 5

## 2015-12-07 MED ORDER — ACETAMINOPHEN 325 MG PO TABS
650.0000 mg | ORAL_TABLET | Freq: Once | ORAL | Status: AC
  Administered 2015-12-07: 14:00:00 650 mg via ORAL
  Filled 2015-12-07: qty 2

## 2015-12-07 MED ORDER — FUROSEMIDE 10 MG/ML IJ SOLN
20.0000 mg | Freq: Once | INTRAMUSCULAR | Status: AC
  Administered 2015-12-07: 14:00:00 20 mg via INTRAVENOUS
  Filled 2015-12-07: qty 2

## 2015-12-07 MED ORDER — BUDESONIDE 0.25 MG/2ML IN SUSP
0.2500 mg | Freq: Two times a day (BID) | RESPIRATORY_TRACT | Status: DC
  Administered 2015-12-07 – 2015-12-10 (×7): 0.25 mg via RESPIRATORY_TRACT
  Filled 2015-12-07 (×7): qty 2

## 2015-12-07 MED ORDER — PREDNISONE 20 MG PO TABS
20.0000 mg | ORAL_TABLET | Freq: Every day | ORAL | Status: DC
  Administered 2015-12-07 – 2015-12-08 (×2): 20 mg via ORAL
  Filled 2015-12-07 (×2): qty 1

## 2015-12-07 NOTE — Progress Notes (Signed)
Initial Nutrition Assessment  DOCUMENTATION CODES:   Non-severe (moderate) malnutrition in context of chronic illness  INTERVENTION:  -Recommend regular diet with no diet restrictions at this time secondary to moderate malnutrition -Pt does not like ensure/boost willing to try magic cup BID for added nutrition   NUTRITION DIAGNOSIS:   Malnutrition related to cancer and cancer related treatments as evidenced by moderate depletion of body fat, moderate depletions of muscle mass.    GOAL:   Patient will meet greater than or equal to 90% of their needs    MONITOR:   PO intake, Supplement acceptance  REASON FOR ASSESSMENT:   Malnutrition Screening Tool    ASSESSMENT:      Pt admitted with pneumonia.  Noted pt received chemotherapy last Thursday  Past Medical History:  Diagnosis Date  . Asthma   . Cancer ([place])    Left lung  . COPD (chronic obstructive pulmonary disease) ([place])   . Generalized anxiety disorder   . Major depressive disorder, recurrent, severe with psychotic features ([place])   . Major depressive disorder, recurrent, severe with psychotic features ([place])   . Persistent disorder of initiating or maintaining sleep   . Persistent disorder of initiating or maintaining sleep    Pt reports appetite is fair. Typically has a biscuit for breakfast (pork chop), then meat and vegetable for lunch or hamburger and frosty, snacks at dinner time (peanut butter nabs). Reports appetite has been good since admission, ate most of pot roast, few bites of pears and most of baked potato.   Medications reviewed: NS at 11m/hr Labs reviewed: creatinine 0.56, hbg 7.8  Nutrition-Focused physical exam completed. Findings are moderate to severe fat depletion, mild/moderate muscle depletion, and no edema.     Diet Order:  Diet Heart Room service appropriate? Yes; Fluid consistency: Thin  Skin:  Reviewed, no issues  Last BM:  8/20  Height:   Ht Readings from Last 1  Encounters:  12/07/15 '5\' 7"'$  (1.702 m)    Weight: pt reports wt loss but unsure how much, wt encounters reviewed and wt looks stable since 09/2015. 153 pounds seems to be an outlier??  Wt Readings from Last 1 Encounters:  12/07/15 130 lb (59 kg)   Wt Readings from Last 10 Encounters:  12/07/15 130 lb (59 kg)  12/03/15 131 lb 6 oz (59.6 kg)  11/12/15 131 lb 1 oz (59.4 kg)  10/31/15 130 lb (59 kg)  10/30/15 132 lb 6.2 oz (60.1 kg)  10/29/15 123 lb (55.8 kg)  10/23/15 153 lb 3.5 oz (69.5 kg)  10/19/15 134 lb 7.7 oz (61 kg)  10/16/15 130 lb (59 kg)  09/17/15 130 lb 4 oz (59.1 kg)     Ideal Body Weight:     BMI:  Body mass index is 20.36 kg/m.  Estimated Nutritional Needs:   Kcal:  14656-8127kcals/d  Protein:  71-89 g/d  Fluid:  >/=20024md  EDUCATION NEEDS:   No education needs identified at this time  Korene Dula B. AlZenia ResidesRDOakleyLDAberdeenpager) Weekend/On-Call pager [phone]

## 2015-12-07 NOTE — Plan of Care (Signed)
Problem: Tissue Perfusion: Goal: Risk factors for ineffective tissue perfusion will decrease Outcome: Progressing Pt received 1 unit packed cells today and tol well.  Lasix iv  Prior to blood and pt diuresing well. Self cathing with assist.  Problem: Fluid Volume: Goal: Ability to maintain a balanced intake and output will improve Outcome: Progressing ivfs d/cd. Once blood started

## 2015-12-07 NOTE — Progress Notes (Signed)
ANTIBIOTIC CONSULT NOTE - INITIAL  Pharmacy Consult for Levaquin  Indication: HCAP  Allergies  Allergen Reactions  . Penicillins Other (See Comments)    Reaction:  Unknown     Patient Measurements: Height: '5\' 7"'$  (170.2 cm) Weight: 133 lb (60.3 kg) IBW/kg (Calculated) : 66.1 Adjusted Body Weight:   Vital Signs: Temp: 97.9 F (36.6 C) (08/21 0032) Temp Source: Oral (08/21 0032) BP: 91/64 (08/21 0032) Pulse Rate: 83 (08/21 0032) Intake/Output from previous day: No intake/output data recorded. Intake/Output from this shift: No intake/output data recorded.  Labs:  Recent Labs  12/06/15 2010  WBC 3.5*  HGB 7.6*  PLT 85*  CREATININE 0.82   Estimated Creatinine Clearance: 57.2 mL/min (by C-G formula based on SCr of 0.82 mg/dL). No results for input(s): VANCOTROUGH, VANCOPEAK, VANCORANDOM, GENTTROUGH, GENTPEAK, GENTRANDOM, TOBRATROUGH, TOBRAPEAK, TOBRARND, AMIKACINPEAK, AMIKACINTROU, AMIKACIN in the last 72 hours.   Microbiology: No results found for this or any previous visit (from the past 720 hour(s)).  Medical History: Past Medical History:  Diagnosis Date  . Asthma   . Cancer ([place])    Left lung  . COPD (chronic obstructive pulmonary disease) ([place])   . Generalized anxiety disorder   . Major depressive disorder, recurrent, severe with psychotic features ([place])   . Major depressive disorder, recurrent, severe with psychotic features ([place])   . Persistent disorder of initiating or maintaining sleep   . Persistent disorder of initiating or maintaining sleep     Medications:  Scheduled:  . atorvastatin  40 mg Oral QHS  . benzonatate  100 mg Oral TID  . enoxaparin (LOVENOX) injection  40 mg Subcutaneous Q24H  . fluticasone furoate-vilanterol  1 puff Inhalation Daily  . guaiFENesin  600 mg Oral BID  . levofloxacin (LEVAQUIN) IV  750 mg Intravenous Q24H  . pantoprazole  40 mg Oral Daily  . predniSONE  20 mg Oral Q breakfast  . tamsulosin  0.4 mg Oral QPC breakfast   . tiotropium  18 mcg Inhalation Daily   Assessment: CrCl = 57.2 ml/min   Goal of Therapy:  resolution of infection  Plan:  Expected duration 7 days with resolution of temperature and/or normalization of WBC  Levaquin 750 mg IV X 1 given on 8/20 @ 22:22. Levaquin 750 mg IV Q24H ordered to start 8/21 @ 22:00.   Zauria Dombek D 12/07/2015,12:36 AM

## 2015-12-07 NOTE — Progress Notes (Signed)
[facility] NOTE  Patient Care Team: Albina Billet, MD as PCP - General (Internal Medicine) Cammie Sickle, MD as Consulting Physician (Hematology and Oncology)  CHIEF COMPLAINTS/PURPOSE OF CONSULTATION:  Lung cancer on chemotherapy  HISTORY OF PRESENTING ILLNESS:  Timothy Ali 82 y.o.  male with history of non-small cell lung cancer metastatic on palliative Chemotherapy with carboplatin and gemcitabine status post cycle #2 approximately 5 days ago is currently admitted to the hospital for shortness of breath/cough/chest x-ray showing pneumonia. Patient on antibiotics. Patient had Neulasta post chemotherapy.  However on discussion the patient- his shortness of breath is not extremely worse; his cough is steady no hemoptysis. He does complain of fatigue. His oximetry performance have not gone up significant.  Appetite is poor. No nausea no vomiting from the chemotherapy. Noted to have right hip pain. He lives at home by himself.  ROS: A complete 10 point review of system is done which is negative except mentioned above in history of present illness  MEDICAL HISTORY:  Past Medical History:  Diagnosis Date  . Asthma   . Cancer ([place])    Left lung  . COPD (chronic obstructive pulmonary disease) ([place])   . Generalized anxiety disorder   . Major depressive disorder, recurrent, severe with psychotic features ([place])   . Major depressive disorder, recurrent, severe with psychotic features ([place])   . Persistent disorder of initiating or maintaining sleep   . Persistent disorder of initiating or maintaining sleep     SURGICAL HISTORY: Past Surgical History:  Procedure Laterality Date  . HEMORROIDECTOMY    . LUNG SURGERY      SOCIAL HISTORY: Social History   Social History  . Marital status: Widowed    Spouse name: N/A  . Number of children: N/A  . Years of education: N/A   Occupational History  . Not on file.   Social History Main Topics  . Smoking  status: Former Research scientist (life sciences)  . Smokeless tobacco: Never Used     Comment: quit 18 years ago  . Alcohol use No  . Drug use: No  . Sexual activity: No   Other Topics Concern  . Not on file   Social History Narrative  . No narrative on file    FAMILY HISTORY: Family History  Problem Relation Age of Onset  . COPD Mother   . Lung cancer Father   . Kidney disease Neg Hx   . Prostate cancer Neg Hx     ALLERGIES:  is allergic to penicillins.  MEDICATIONS:  Current Facility-Administered Medications  Medication Dose Route Frequency Provider Last Rate Last Dose  . acetaminophen (TYLENOL) tablet 650 mg  650 mg Oral Q6H PRN Alexis Hugelmeyer, DO       Or  . acetaminophen (TYLENOL) suppository 650 mg  650 mg Rectal Q6H PRN Alexis Hugelmeyer, DO      . albuterol (PROVENTIL) (2.5 MG/3ML) 0.083% nebulizer solution 2.5 mg  2.5 mg Nebulization Q6H PRN Alexis Hugelmeyer, DO      . atorvastatin (LIPITOR) tablet 40 mg  40 mg Oral QHS Alexis Hugelmeyer, DO   40 mg at 12/07/15 0042  . benzonatate (TESSALON) capsule 100 mg  100 mg Oral TID Alexis Hugelmeyer, DO   100 mg at 12/07/15 1553  . bisacodyl (DULCOLAX) EC tablet 5 mg  5 mg Oral Daily PRN Alexis Hugelmeyer, DO      . budesonide (PULMICORT) nebulizer solution 0.25 mg  0.25 mg Nebulization BID Loletha Grayer, MD  0.25 mg at 12/07/15 1113  . enoxaparin (LOVENOX) injection 40 mg  40 mg Subcutaneous Q24H Alexis Hugelmeyer, DO   40 mg at 12/07/15 0042  . guaiFENesin (MUCINEX) 12 hr tablet 600 mg  600 mg Oral BID Alexis Hugelmeyer, DO   600 mg at 12/07/15 1134  . heparin lock flush 100 unit/mL  500 Units Intracatheter Daily PRN Cammie Sickle, MD      . heparin lock flush 100 unit/mL  250 Units Intracatheter PRN Cammie Sickle, MD      . HYDROcodone-acetaminophen (NORCO/VICODIN) 5-325 MG per tablet 1-2 tablet  1-2 tablet Oral Q4H PRN Alexis Hugelmeyer, DO   2 tablet at 12/07/15 0235  . levofloxacin (LEVAQUIN) IVPB 750 mg  750 mg Intravenous  Q24H Alexis Hugelmeyer, DO      . LORazepam (ATIVAN) tablet 1 mg  1 mg Oral Q8H PRN Alexis Hugelmeyer, DO   1 mg at 12/07/15 1555  . magnesium citrate solution 1 Bottle  1 Bottle Oral Once PRN Alexis Hugelmeyer, DO      . ondansetron (ZOFRAN) tablet 8 mg  8 mg Oral Q8H PRN Alexis Hugelmeyer, DO      . pantoprazole (PROTONIX) EC tablet 40 mg  40 mg Oral Daily Alexis Hugelmeyer, DO   40 mg at 12/07/15 1135  . predniSONE (DELTASONE) tablet 20 mg  20 mg Oral Q breakfast Alexis Hugelmeyer, DO   20 mg at 12/07/15 1134  . senna-docusate (Senokot-S) tablet 1 tablet  1 tablet Oral QHS PRN Alexis Hugelmeyer, DO      . sodium chloride flush (NS) 0.9 % injection 10 mL  10 mL Intracatheter PRN Cammie Sickle, MD      . sodium chloride flush (NS) 0.9 % injection 3 mL  3 mL Intracatheter PRN Cammie Sickle, MD      . tamsulosin (FLOMAX) capsule 0.4 mg  0.4 mg Oral QPC breakfast Alexis Hugelmeyer, DO   0.4 mg at 12/07/15 1135  . tiotropium [facility]) inhalation capsule 18 mcg  18 mcg Inhalation Daily Alexis Hugelmeyer, DO   18 mcg at 12/07/15 1134      .  PHYSICAL EXAMINATION:  Vitals:   12/07/15 1428 12/07/15 1445  BP: (!) 109/91 115/61  Pulse: 83 79  Resp: (!) 22 20  Temp: 98 F (36.7 C) 98 F (36.7 C)   Filed Weights   12/06/15 1922 12/07/15 0032  Weight: 133 lb (60.3 kg) 130 lb (59 kg)    GENERAL: Well-nourished well-developed; Alert, no distress and comfortable.   Home O2. 3 L. EYES: no pallor or icterus OROPHARYNX: no thrush or ulceration. NECK: supple, no masses felt LYMPH:  no palpable lymphadenopathy in the cervical, axillary or inguinal regions LUNGS: decreased breath sounds to auscultation at bases and  No wheeze or crackles HEART/CVS: regular rate & rhythm and no murmurs; No lower extremity edema ABDOMEN: abdomen soft, non-tender and normal bowel sounds Musculoskeletal:no cyanosis of digits and no clubbing  PSYCH: alert & oriented x 3 with fluent speech NEURO: no  focal motor/sensory deficits SKIN:  no rashes or significant lesions  LABORATORY DATA:  I have reviewed the data as listed Lab Results  Component Value Date   WBC 4.0 12/07/2015   HGB 7.8 (L) 12/07/2015   HCT 23.3 (L) 12/07/2015   MCV 89.0 12/07/2015   PLT 84 (L) 12/07/2015    Recent Labs  10/31/15 1658  11/12/15 0944  12/03/15 0842 12/06/15 2010 12/07/15 0512  NA 134*  < > 138  < >  138 139 139  K 3.7  < > 3.5  < > 3.4* 3.5 4.1  CL 100*  < > 98*  < > 103 106 106  CO2 26  < > 32  < > '29 29 31  '$ GLUCOSE 150*  < > 100*  < > 113* 146* 99  BUN 17  < > 11  < > '17 15 14  '$ CREATININE 0.63  < > 0.68  < > 0.72 0.82 0.56*  CALCIUM 7.7*  < > 8.5*  < > 8.1* 7.6* 7.6*  GFRNONAA >60  < > >60  < > >60 >60 >60  GFRAA >60  < > >60  < > >60 >60 >60  PROT 6.0*  --  7.0  --  6.6  --   --   ALBUMIN 2.8*  --  3.0*  --  3.3*  --   --   AST 18  --  14*  --  15  --   --   ALT 11*  --  10*  --  16*  --   --   ALKPHOS 63  --  66  --  66  --   --   BILITOT 0.3  --  0.3  --  0.3  --   --   < > = values in this interval not displayed.  RADIOGRAPHIC STUDIES: I have personally reviewed the radiological images as listed and agreed with the findings in the report. Dg Chest 2 View  Result Date: 12/06/2015 CLINICAL DATA:  Shortness of breath EXAM: CHEST  2 VIEW COMPARISON:  10/31/2015 FINDINGS: COPD with diffuse airway thickening, emphysematous change and hyperinflation. Subtle patchy right upper lobe density. No edema or effusion. No pneumothorax. Normal heart size and aortic contours. Spondyloarthropathy with diffuse thoracic ankylosis. IMPRESSION: 1. Possible right upper lobe bronchopneumonia. Followup PA and lateral chest X-ray is recommended in 3-4 weeks following trial of antibiotic therapy to ensure resolution. 2.  Emphysema. (ICD10-J43.9) and chronic bronchitis. 3. Spondyloarthropathy and diffuse thoracic ankylosis. Electronically Signed   By: Monte Fantasia M.D.   On: 12/06/2015 20:49   Dg Hip Unilat  With Pelvis 2-3 Views Right  Result Date: 12/07/2015 CLINICAL DATA:  Pain.  History of lung carcinoma EXAM: DG HIP (WITH OR WITHOUT PELVIS) 2-3V RIGHT COMPARISON:  None. FINDINGS: Frontal pelvis as well as frontal and lateral right hip images were obtained. There is moderate osteoarthritic change in both hip joints, symmetric. There is no apparent fracture or dislocation. No erosive change. Bones are somewhat osteoporotic. No blastic or lytic bone lesions evident. IMPRESSION: Bones somewhat osteoporotic. Symmetric osteoarthritic change in both hip joints. No acute fracture or dislocation. No blastic or lytic bone lesions evident. Electronically Signed   By: Lowella Grip III M.D.   On: 12/07/2015 11:10    ASSESSMENT & PLAN:   # 82 year old male patient history of metastatic non-small cell lung cancer/COPD on home O2- currently on palliative chemotherapy admitted to the hospital for worsening shortness of breath cough.  # Worsening shortness of breath cough- multifactorial COPD/chest x-ray possible pneumonia/ underlying lung cancer/anemia. Agree with antibiotics.   # Anemia hemoglobin 7.8- from chemotherapy. Given the recent chemotherapy I recommend unit of PRBC transfusion. This might help the patient's symptoms.  # Right hip pain- x-rays showed no evidence of any significant bony or lytic lesions concerning for metastatic malignancy to the bone. Recommend pain control at this time.  # The above plan of care was discussed with Dr.Wieting.   I  reviewed the images myself and with the patient.   Thank you Dr. Leslye Peer for allowing me to participate in the care of your pleasant patient. Please do not hesitate to contact me with questions or concerns in the interim.  All questions were answered.     Cammie Sickle, MD 12/07/2015 5:11 PM

## 2015-12-07 NOTE — Care Management Important Message (Signed)
Important Message  Patient Details  Name: Timothy Ali MRN: [id number] Date of Birth: March 17, 1930   Medicare Important Message Given:  Yes    Shelbie Ammons, RN 12/07/2015, 12:15 PM

## 2015-12-07 NOTE — Progress Notes (Addendum)
[facility] PROGRESS NOTE  Timothy Ali PXT:[id number] DOB: 12-Jan-1930 DOA: 12/06/2015 PCP: Albina Billet, MD  HPI/Subjective: Patient feels okay. Came in with shortness of breath and wheeze. Cough nonproductive. Some pain in his right groin area.  Objective: Vitals:   12/07/15 1355 12/07/15 1428  BP: (!) 128/59 (!) 109/91  Pulse: 73 83  Resp: (!) 22 (!) 22  Temp: 97.4 F (36.3 C) 98 F (36.7 C)    Filed Weights   12/06/15 1922 12/07/15 0032  Weight: 60.3 kg (133 lb) 59 kg (130 lb)    ROS: Review of Systems  Constitutional: Negative for chills and fever.  Eyes: Negative for blurred vision.  Respiratory: Positive for cough, shortness of breath and wheezing.   Cardiovascular: Negative for chest pain.  Gastrointestinal: Negative for abdominal pain, constipation, diarrhea, nausea and vomiting.  Genitourinary: Negative for dysuria.  Musculoskeletal: Positive for joint pain.  Neurological: Negative for dizziness and headaches.   Exam: Physical Exam  Constitutional: He is oriented to person, place, and time.  HENT:  Nose: No mucosal edema.  Mouth/Throat: No oropharyngeal exudate or posterior oropharyngeal edema.  Eyes: Conjunctivae, EOM and lids are normal. Pupils are equal, round, and reactive to light.  Neck: No JVD present. Carotid bruit is not present. No edema present. No thyroid mass and no thyromegaly present.  Cardiovascular: S1 normal and S2 normal.  Exam reveals no gallop.   No murmur heard. Pulses:      Dorsalis pedis pulses are 2+ on the right side, and 2+ on the left side.  Respiratory: No respiratory distress. He has wheezes in the right middle field, the right lower field, the left middle field and the left lower field. He has no rhonchi. He has no rales.  GI: Soft. Bowel sounds are normal. There is no tenderness.  Musculoskeletal:       Right ankle: He exhibits no swelling.       Left ankle: He exhibits no swelling.  Right hip good range of motion.  Able to straight leg raise without a problem. He points to the pain in his right groin area.  Lymphadenopathy:    He has no cervical adenopathy.  Neurological: He is alert and oriented to person, place, and time. No cranial nerve deficit.  Skin: Skin is warm. No rash noted. Nails show no clubbing.  Psychiatric: He has a normal mood and affect.      Data Reviewed: Basic Metabolic Panel:  Recent Labs Lab 12/03/15 0842 12/06/15 2010 12/07/15 0512  NA 138 139 139  K 3.4* 3.5 4.1  CL 103 106 106  CO2 '29 29 31  '$ GLUCOSE 113* 146* 99  BUN '17 15 14  '$ CREATININE 0.72 0.82 0.56*  CALCIUM 8.1* 7.6* 7.6*   Liver Function Tests:  Recent Labs Lab 12/03/15 0842  AST 15  ALT 16*  ALKPHOS 66  BILITOT 0.3  PROT 6.6  ALBUMIN 3.3*   CBC:  Recent Labs Lab 12/03/15 0842 12/06/15 2010 12/07/15 0512  WBC 3.0* 3.5* 4.0  NEUTROABS 1.3* 2.9  --   HGB 9.7* 7.6* 7.8*  HCT 28.5* 22.8* 23.3*  MCV 87.6 88.0 89.0  PLT 82* 85* 84*   Cardiac Enzymes:  Recent Labs Lab 12/06/15 2010  TROPONINI <0.03   BNP (last 3 results)  Recent Labs  10/29/15 1342 10/31/15 1658  BNP 47.0 65.0      Recent Results (from the past 240 hour(s))  Blood culture (routine x 2)     Status: None (Preliminary  result)   Collection Time: 12/06/15 10:01 PM  Result Value Ref Range Status   Specimen Description BLOOD LEFT ASSIST CONTROL  Final   Special Requests   Final    BOTTLES DRAWN AEROBIC AND ANAEROBIC 25CCAERO,25CCANA   Culture NO GROWTH < 12 HOURS  Final   Report Status PENDING  Incomplete  Blood culture (routine x 2)     Status: None (Preliminary result)   Collection Time: 12/06/15 10:01 PM  Result Value Ref Range Status   Specimen Description BLOOD RIGHT FOREARM  Final   Special Requests   Final    BOTTLES DRAWN AEROBIC AND ANAEROBIC 25CCAERO,25CCANA   Culture NO GROWTH < 12 HOURS  Final   Report Status PENDING  Incomplete     Studies: Dg Chest 2 View  Result Date: 12/06/2015 CLINICAL  DATA:  Shortness of breath EXAM: CHEST  2 VIEW COMPARISON:  10/31/2015 FINDINGS: COPD with diffuse airway thickening, emphysematous change and hyperinflation. Subtle patchy right upper lobe density. No edema or effusion. No pneumothorax. Normal heart size and aortic contours. Spondyloarthropathy with diffuse thoracic ankylosis. IMPRESSION: 1. Possible right upper lobe bronchopneumonia. Followup PA and lateral chest X-ray is recommended in 3-4 weeks following trial of antibiotic therapy to ensure resolution. 2.  Emphysema. (ICD10-J43.9) and chronic bronchitis. 3. Spondyloarthropathy and diffuse thoracic ankylosis. Electronically Signed   By: Monte Fantasia M.D.   On: 12/06/2015 20:49   Dg Hip Unilat With Pelvis 2-3 Views Right  Result Date: 12/07/2015 CLINICAL DATA:  Pain.  History of lung carcinoma EXAM: DG HIP (WITH OR WITHOUT PELVIS) 2-3V RIGHT COMPARISON:  None. FINDINGS: Frontal pelvis as well as frontal and lateral right hip images were obtained. There is moderate osteoarthritic change in both hip joints, symmetric. There is no apparent fracture or dislocation. No erosive change. Bones are somewhat osteoporotic. No blastic or lytic bone lesions evident. IMPRESSION: Bones somewhat osteoporotic. Symmetric osteoarthritic change in both hip joints. No acute fracture or dislocation. No blastic or lytic bone lesions evident. Electronically Signed   By: Lowella Grip III M.D.   On: 12/07/2015 11:10    Scheduled Meds: . atorvastatin  40 mg Oral QHS  . benzonatate  100 mg Oral TID  . budesonide (PULMICORT) nebulizer solution  0.25 mg Nebulization BID  . enoxaparin (LOVENOX) injection  40 mg Subcutaneous Q24H  . guaiFENesin  600 mg Oral BID  . levofloxacin (LEVAQUIN) IV  750 mg Intravenous Q24H  . pantoprazole  40 mg Oral Daily  . predniSONE  20 mg Oral Q breakfast  . tamsulosin  0.4 mg Oral QPC breakfast  . tiotropium  18 mcg Inhalation Daily   Continuous Infusions: . sodium chloride 75 mL/hr at  12/07/15 0500    Assessment/Plan:  1. Pneumonia with toxic granulation seen on CBC. Continue IV Levaquin. Follow-up blood cultures. 2. History of lung cancer with recent chemotherapy. Pancytopenia secondary to chemotherapy. Oncology ordered a unit of blood. 3. Chronic respiratory failure on 3 L oxygen. Continue prednisone. Nebulizer treatments. Add budesonide nebulizers. 4. BPH on Flomax 5. Hyperlipidemia unspecified on atorvastatin 6. GERD on Protonix 7. Right groin pain likely lateral femoral cutaneous nerve impingement. Physical therapy should help. 8. Moderate malnutrition.  Code Status:     Code Status Orders        Start     Ordered   12/07/15 0025  Limited resuscitation (code)  Continuous    Question Answer Comment  In the event of cardiac or respiratory ARREST: Initiate Code Blue, Call Rapid Response  No   In the event of cardiac or respiratory ARREST: Perform CPR No   In the event of cardiac or respiratory ARREST: Perform Intubation/Mechanical Ventilation Yes   In the event of cardiac or respiratory ARREST: Use NIPPV/BiPAp only if indicated Yes   In the event of cardiac or respiratory ARREST: Administer ACLS medications if indicated No   In the event of cardiac or respiratory ARREST: Perform Defibrillation or Cardioversion if indicated No      12/07/15 0024    Code Status History    Date Active Date Inactive Code Status Order ID Comments User Context   10/31/2015  9:55 PM 11/02/2015  6:04 PM Full Code [id number]  Henreitta Leber, MD ED   08/19/2015  4:01 PM 08/21/2015  2:32 PM Full Code [id number]  Dustin Flock, MD ED   10/24/2014 12:48 AM 10/25/2014  5:18 PM Full Code [id number]  Juluis Mire, MD Inpatient    Advance Directive Documentation   Flowsheet Row Most Recent Value  Type of Advance Directive  Living will, Out of facility DNR (pink MOST or yellow form)  Pre-existing out of facility DNR order (yellow form or pink MOST form)  Yellow form placed in chart (order not  valid for inpatient use)  "MOST" Form in Place?  No data      Disposition Plan: Home soon  Consultants:  Oncology  Antibiotics:  Levaquin  Time spent: 25 minutes  Loletha Grayer  [employer]

## 2015-12-07 NOTE — Care Management (Signed)
Admitted to [facility] with the diagnosis of pneumonia. Lives alone. Sister is Philemon Kingdom. Last seen Dr. Hall Busing a month ago. States a Conservation officer, historic buildings comes to see him every week and then sometimes every other week. Home Health x 2 in the past through [facility]. No skilled facility. Home oxygen 2-3 liters continuous per nasal cannula x 1 year through [facility]. No falls Good appetite. Uses no aids for ambulation. No Life Alert. Prescriptions are filled through [facility] in [place] care of all basic and instrumental activities of daily living himself, drives.  Shelbie Ammons RN MSN CCM Care Management [phone]

## 2015-12-08 DIAGNOSIS — E440 Moderate protein-calorie malnutrition: Secondary | ICD-10-CM | POA: Insufficient documentation

## 2015-12-08 LAB — CBC WITH DIFFERENTIAL/PLATELET
BASOS ABS: 0 10*3/uL (ref 0–0.1)
BLASTS: 0 %
Band Neutrophils: 16 %
Basophils Relative: 0 %
Eosinophils Absolute: 0 10*3/uL (ref 0–0.7)
Eosinophils Relative: 0 %
HEMATOCRIT: 31.1 % — AB (ref 40.0–52.0)
HEMOGLOBIN: 10.7 g/dL — AB (ref 13.0–18.0)
Lymphocytes Relative: 9 %
Lymphs Abs: 0.8 10*3/uL — ABNORMAL LOW (ref 1.0–3.6)
MCH: 30.7 pg (ref 26.0–34.0)
MCHC: 34.4 g/dL (ref 32.0–36.0)
MCV: 89.3 fL (ref 80.0–100.0)
METAMYELOCYTES PCT: 3 %
MYELOCYTES: 0 %
Monocytes Absolute: 0.3 10*3/uL (ref 0.2–1.0)
Monocytes Relative: 3 %
Neutro Abs: 8 10*3/uL — ABNORMAL HIGH (ref 1.4–6.5)
Neutrophils Relative %: 69 %
Other: 0 %
Platelets: 80 10*3/uL — ABNORMAL LOW (ref 150–440)
Promyelocytes Absolute: 0 %
RBC: 3.48 MIL/uL — AB (ref 4.40–5.90)
RDW: 17.3 % — ABNORMAL HIGH (ref 11.5–14.5)
WBC: 9.1 10*3/uL (ref 3.8–10.6)
nRBC: 0 /100 WBC

## 2015-12-08 LAB — TYPE AND SCREEN
ABO/RH(D): O NEG
ANTIBODY SCREEN: NEGATIVE
UNIT DIVISION: 0

## 2015-12-08 MED ORDER — SENNOSIDES-DOCUSATE SODIUM 8.6-50 MG PO TABS
1.0000 | ORAL_TABLET | Freq: Every day | ORAL | Status: DC
  Administered 2015-12-08: 16:00:00 1 via ORAL
  Filled 2015-12-08: qty 1

## 2015-12-08 NOTE — Progress Notes (Signed)
CSW was consulted stating that patient may need placement. PT is recommending no PT follow up at this time. CSW is signing off but is available if a need were to arise.   Ernest Pine, MSW, LCSW, [place] Clinical Social Worker [phone]

## 2015-12-08 NOTE — Progress Notes (Signed)
[facility] PROGRESS NOTE  Timothy Ali LMB:[id number] DOB: Jun 16, 1929 DOA: 12/06/2015 PCP: Albina Billet, MD  HPI/Subjective: Patient feeling weak today. Not feeling well. Still with cough and shortness of breath and wheezing.  Objective: Vitals:   12/08/15 0919 12/08/15 1217  BP: (!) 122/58 132/69  Pulse: 87 96  Resp: 20   Temp: 97.8 F (36.6 C) 97.6 F (36.4 C)    Filed Weights   12/06/15 1922 12/07/15 0032  Weight: 60.3 kg (133 lb) 59 kg (130 lb)    ROS: Review of Systems  Constitutional: Negative for chills and fever.  Eyes: Negative for blurred vision.  Respiratory: Positive for cough, shortness of breath and wheezing.   Cardiovascular: Negative for chest pain.  Gastrointestinal: Negative for abdominal pain, constipation, diarrhea, nausea and vomiting.  Genitourinary: Negative for dysuria.  Musculoskeletal: Positive for joint pain.  Neurological: Negative for dizziness and headaches.   Exam: Physical Exam  Constitutional: He is oriented to person, place, and time.  HENT:  Nose: No mucosal edema.  Mouth/Throat: No oropharyngeal exudate or posterior oropharyngeal edema.  Eyes: Conjunctivae, EOM and lids are normal. Pupils are equal, round, and reactive to light.  Neck: No JVD present. Carotid bruit is not present. No edema present. No thyroid mass and no thyromegaly present.  Cardiovascular: S1 normal and S2 normal.  Exam reveals no gallop.   No murmur heard. Pulses:      Dorsalis pedis pulses are 2+ on the right side, and 2+ on the left side.  Respiratory: No respiratory distress. He has decreased breath sounds in the right lower field and the left lower field. He has wheezes in the right lower field and the left lower field. He has no rhonchi. He has no rales.  GI: Soft. Bowel sounds are normal. There is no tenderness.  Musculoskeletal:       Right ankle: He exhibits no swelling.       Left ankle: He exhibits no swelling.  Right hip good range of motion.  Able to straight leg raise without a problem. He points to the pain in his right groin area.  Lymphadenopathy:    He has no cervical adenopathy.  Neurological: He is alert and oriented to person, place, and time. No cranial nerve deficit.  Skin: Skin is warm. No rash noted. Nails show no clubbing.  Psychiatric: He has a normal mood and affect.      Data Reviewed: Basic Metabolic Panel:  Recent Labs Lab 12/03/15 0842 12/06/15 2010 12/07/15 0512  NA 138 139 139  K 3.4* 3.5 4.1  CL 103 106 106  CO2 '29 29 31  '$ GLUCOSE 113* 146* 99  BUN '17 15 14  '$ CREATININE 0.72 0.82 0.56*  CALCIUM 8.1* 7.6* 7.6*   Liver Function Tests:  Recent Labs Lab 12/03/15 0842  AST 15  ALT 16*  ALKPHOS 66  BILITOT 0.3  PROT 6.6  ALBUMIN 3.3*   CBC:  Recent Labs Lab 12/03/15 0842 12/06/15 2010 12/07/15 0512 12/08/15 0412  WBC 3.0* 3.5* 4.0 9.1  NEUTROABS 1.3* 2.9  --  8.0*  HGB 9.7* 7.6* 7.8* 10.7*  HCT 28.5* 22.8* 23.3* 31.1*  MCV 87.6 88.0 89.0 89.3  PLT 82* 85* 84* 80*   Cardiac Enzymes:  Recent Labs Lab 12/06/15 2010  TROPONINI <0.03   BNP (last 3 results)  Recent Labs  10/29/15 1342 10/31/15 1658  BNP 47.0 65.0      Recent Results (from the past 240 hour(s))  Blood culture (routine x  2)     Status: None (Preliminary result)   Collection Time: 12/06/15 10:01 PM  Result Value Ref Range Status   Specimen Description BLOOD LEFT ASSIST CONTROL  Final   Special Requests   Final    BOTTLES DRAWN AEROBIC AND ANAEROBIC 25CCAERO,25CCANA   Culture NO GROWTH 2 DAYS  Final   Report Status PENDING  Incomplete  Blood culture (routine x 2)     Status: None (Preliminary result)   Collection Time: 12/06/15 10:01 PM  Result Value Ref Range Status   Specimen Description BLOOD RIGHT FOREARM  Final   Special Requests   Final    BOTTLES DRAWN AEROBIC AND ANAEROBIC 25CCAERO,25CCANA   Culture NO GROWTH 2 DAYS  Final   Report Status PENDING  Incomplete  Culture, sputum-assessment      Status: None (Preliminary result)   Collection Time: 12/07/15 12:25 AM  Result Value Ref Range Status   Specimen Description SPUTUM  Final   Special Requests Immunocompromised  Final   Sputum evaluation   Final    Sputum specimen not acceptable for testing.  Please recollect.   SPOKE TO [place] 12/07/15 @ 2203  [place]    Report Status PENDING  Incomplete     Studies: Dg Chest 2 View  Result Date: 12/06/2015 CLINICAL DATA:  Shortness of breath EXAM: CHEST  2 VIEW COMPARISON:  10/31/2015 FINDINGS: COPD with diffuse airway thickening, emphysematous change and hyperinflation. Subtle patchy right upper lobe density. No edema or effusion. No pneumothorax. Normal heart size and aortic contours. Spondyloarthropathy with diffuse thoracic ankylosis. IMPRESSION: 1. Possible right upper lobe bronchopneumonia. Followup PA and lateral chest X-ray is recommended in 3-4 weeks following trial of antibiotic therapy to ensure resolution. 2.  Emphysema. (ICD10-J43.9) and chronic bronchitis. 3. Spondyloarthropathy and diffuse thoracic ankylosis. Electronically Signed   By: Monte Fantasia M.D.   On: 12/06/2015 20:49   Dg Hip Unilat With Pelvis 2-3 Views Right  Result Date: 12/07/2015 CLINICAL DATA:  Pain.  History of lung carcinoma EXAM: DG HIP (WITH OR WITHOUT PELVIS) 2-3V RIGHT COMPARISON:  None. FINDINGS: Frontal pelvis as well as frontal and lateral right hip images were obtained. There is moderate osteoarthritic change in both hip joints, symmetric. There is no apparent fracture or dislocation. No erosive change. Bones are somewhat osteoporotic. No blastic or lytic bone lesions evident. IMPRESSION: Bones somewhat osteoporotic. Symmetric osteoarthritic change in both hip joints. No acute fracture or dislocation. No blastic or lytic bone lesions evident. Electronically Signed   By: Lowella Grip III M.D.   On: 12/07/2015 11:10    Scheduled Meds: . atorvastatin  40 mg Oral QHS  . benzonatate  100 mg Oral  TID  . budesonide (PULMICORT) nebulizer solution  0.25 mg Nebulization BID  . enoxaparin (LOVENOX) injection  40 mg Subcutaneous Q24H  . guaiFENesin  600 mg Oral BID  . levofloxacin (LEVAQUIN) IV  750 mg Intravenous Q24H  . pantoprazole  40 mg Oral Daily  . predniSONE  20 mg Oral Q breakfast  . senna-docusate  1 tablet Oral Daily  . tamsulosin  0.4 mg Oral QPC breakfast  . tiotropium  18 mcg Inhalation Daily    Assessment/Plan:  1. Pneumonia with toxic granulation seen on CBC. Continue IV Levaquin. So for blood cultures negative. Sputum culture not enough sputum. Continue to watch here in the hospital for another day or so depending on clinical course. 2. History of lung cancer with recent chemotherapy. Pancytopenia secondary to chemotherapy. Oncology ordered a unit  of blood. 3. Chronic respiratory failure on 3 L oxygen. Continue prednisone. Nebulizer treatments. Added budesonide nebulizers. 4. BPH on Flomax 5. Hyperlipidemia unspecified on atorvastatin 6. GERD on Protonix 7. Right groin pain likely lateral femoral cutaneous nerve impingement. Physical therapy should help. 8. Moderate malnutrition.  Code Status:     Code Status Orders        Start     Ordered   12/07/15 0025  Limited resuscitation (code)  Continuous    Question Answer Comment  In the event of cardiac or respiratory ARREST: Initiate Code Blue, Call Rapid Response No   In the event of cardiac or respiratory ARREST: Perform CPR No   In the event of cardiac or respiratory ARREST: Perform Intubation/Mechanical Ventilation Yes   In the event of cardiac or respiratory ARREST: Use NIPPV/BiPAp only if indicated Yes   In the event of cardiac or respiratory ARREST: Administer ACLS medications if indicated No   In the event of cardiac or respiratory ARREST: Perform Defibrillation or Cardioversion if indicated No      12/07/15 0024    Code Status History    Date Active Date Inactive Code Status Order ID Comments User  Context   10/31/2015  9:55 PM 11/02/2015  6:04 PM Full Code [id number]  Henreitta Leber, MD ED   08/19/2015  4:01 PM 08/21/2015  2:32 PM Full Code [id number]  Dustin Flock, MD ED   10/24/2014 12:48 AM 10/25/2014  5:18 PM Full Code [id number]  Juluis Mire, MD Inpatient    Advance Directive Documentation   Flowsheet Row Most Recent Value  Type of Advance Directive  Living will, Out of facility DNR (pink MOST or yellow form)  Pre-existing out of facility DNR order (yellow form or pink MOST form)  Yellow form placed in chart (order not valid for inpatient use)  "MOST" Form in Place?  No data      Disposition Plan: Home In next day or so  Consultants:  Oncology  Antibiotics:  Levaquin  Time spent: 22 minutes  Loletha Grayer  [employer]

## 2015-12-08 NOTE — Plan of Care (Signed)
Problem: Health Behavior/Discharge Planning: Goal: Ability to manage health-related needs will improve Outcome: Progressing Med for anxiety / effective

## 2015-12-08 NOTE — Plan of Care (Signed)
Problem: Pain Managment: Goal: General experience of comfort will improve Outcome: Not Progressing Pt continues to have pain to left leg/ hip that is relieved for short periods by oral pain medication.  Problem: Nutrition: Goal: Adequate nutrition will be maintained Outcome: Progressing Pt states his appetite has improved and states ways he can increase calories to gain weight.

## 2015-12-08 NOTE — Evaluation (Signed)
Physical Therapy Evaluation Patient Details Name: Timothy Ali MRN: [id number] DOB: November 30, 1929 Today's Date: 12/08/2015   History of Present Illness  Pt is an 82 y.o. male with history of non-small cell lung cancer metastatic on palliative chemotherapy with carboplatin and gemcitabine status post cycle #2 approximately 5 days ago is currently admitted to the hospital for shortness of breath/cough/chest x-ray showing pneumonia. Patient on antibiotics. Patient had Neulasta post chemotherapy.  Clinical Impression  Pt independent with all mobility, transfers, and gait and steady during informal balance assessment with no apparent deficits.  Pt reports that feels he is at baseline level of function at this time.  PT orders completed at this time and will reassess as requested if there is a change in status.     Follow Up Recommendations No PT follow up    Equipment Recommendations       Recommendations for Other Services       Precautions / Restrictions Precautions Precautions: None Restrictions Weight Bearing Restrictions: No      Mobility  Bed Mobility Overal bed mobility: Independent                Transfers Overall transfer level: Independent                  Ambulation/Gait Ambulation/Gait assistance: Independent Ambulation Distance (Feet): 30 Feet Assistive device: None Gait Pattern/deviations: WFL(Within Functional Limits)        Stairs            Wheelchair Mobility    Modified Rankin (Stroke Patients Only)       Balance Overall balance assessment: Independent;No apparent balance deficits (not formally assessed)                                           Pertinent Vitals/Pain Pain Assessment: No/denies pain    Home Living Family/patient expects to be discharged to:: Private residence Living Arrangements: Alone   Type of Home: Apartment Home Access: Level entry     Home Layout: One level Home Equipment:  [place] - 2 wheels;Cane - quad      Prior Function Level of Independence: Independent         Comments: Chronic 3LO2/min      Hand Dominance   Dominant Hand: Right    Extremity/Trunk Assessment   Upper Extremity Assessment: Overall WFL for tasks assessed           Lower Extremity Assessment: Overall WFL for tasks assessed         Communication   Communication: No difficulties  Cognition Arousal/Alertness: Awake/alert Behavior During Therapy: WFL for tasks assessed/performed Overall Cognitive Status: Within Functional Limits for tasks assessed                      General Comments      Exercises        Assessment/Plan    PT Assessment Patent does not need any further PT services  PT Diagnosis     PT Problem List    PT Treatment Interventions     PT Goals (Current goals can be found in the Care Plan section)      Frequency     Barriers to discharge        Co-evaluation               End of Session   Activity  Tolerance: Patient tolerated treatment well Patient left: in bed;with call bell/phone within reach           Time: 0935-1000 PT Time Calculation (min) (ACUTE ONLY): 25 min   Charges:   PT Evaluation $PT Eval Low Complexity: 1 Procedure     PT G Codes:        DRoyetta Asal PT, DPT 12/08/15, 11:57 AM

## 2015-12-09 DIAGNOSIS — C7800 Secondary malignant neoplasm of unspecified lung: Secondary | ICD-10-CM

## 2015-12-09 DIAGNOSIS — D649 Anemia, unspecified: Secondary | ICD-10-CM

## 2015-12-09 DIAGNOSIS — Z87891 Personal history of nicotine dependence: Secondary | ICD-10-CM

## 2015-12-09 DIAGNOSIS — J449 Chronic obstructive pulmonary disease, unspecified: Secondary | ICD-10-CM

## 2015-12-09 MED ORDER — SENNOSIDES-DOCUSATE SODIUM 8.6-50 MG PO TABS
1.0000 | ORAL_TABLET | Freq: Two times a day (BID) | ORAL | Status: DC
  Administered 2015-12-09 – 2015-12-10 (×3): 1 via ORAL
  Filled 2015-12-09 (×3): qty 1

## 2015-12-09 MED ORDER — QUETIAPINE FUMARATE 25 MG PO TABS
25.0000 mg | ORAL_TABLET | Freq: Every day | ORAL | Status: DC
  Administered 2015-12-09: 21:00:00 25 mg via ORAL
  Filled 2015-12-09: qty 1

## 2015-12-09 MED ORDER — PREDNISONE 10 MG PO TABS
10.0000 mg | ORAL_TABLET | Freq: Every day | ORAL | Status: DC
  Administered 2015-12-09 – 2015-12-10 (×2): 10 mg via ORAL
  Filled 2015-12-09 (×2): qty 1

## 2015-12-09 NOTE — Care Management (Signed)
Spoke with Mr. Bache regarding home health agencies. Agreed to be seen in the home by [facility]. Flo Shanks, RN representative for [facility] updated. Possible discharge to day tomorrow per Dr. Leslye Peer. Shelbie Ammons RN MSN CCM Care Management [phone]

## 2015-12-09 NOTE — Progress Notes (Signed)
[facility] NOTE  Patient Care Team: Albina Billet, MD as PCP - General (Internal Medicine) Cammie Sickle, MD as Consulting Physician (Hematology and Oncology)    CC: Coughing improved. Shortness of breath this stable. Denies any hemoptysis. He had difficulty sleeping at night. No chest pain.  He is eager to go home.  MEDICAL HISTORY:  Past Medical History:  Diagnosis Date  . Asthma   . Cancer ([place])    Left lung  . COPD (chronic obstructive pulmonary disease) ([place])   . Generalized anxiety disorder   . Major depressive disorder, recurrent, severe with psychotic features ([place])   . Major depressive disorder, recurrent, severe with psychotic features ([place])   . Persistent disorder of initiating or maintaining sleep   . Persistent disorder of initiating or maintaining sleep     SURGICAL HISTORY: Past Surgical History:  Procedure Laterality Date  . HEMORROIDECTOMY    . LUNG SURGERY      SOCIAL HISTORY: Social History   Social History  . Marital status: Widowed    Spouse name: N/A  . Number of children: N/A  . Years of education: N/A   Occupational History  . Not on file.   Social History Main Topics  . Smoking status: Former Research scientist (life sciences)  . Smokeless tobacco: Never Used     Comment: quit 18 years ago  . Alcohol use No  . Drug use: No  . Sexual activity: No   Other Topics Concern  . Not on file   Social History Narrative  . No narrative on file    FAMILY HISTORY: Family History  Problem Relation Age of Onset  . COPD Mother   . Lung cancer Father   . Kidney disease Neg Hx   . Prostate cancer Neg Hx     ALLERGIES:  is allergic to penicillins.  MEDICATIONS:  Current Facility-Administered Medications  Medication Dose Route Frequency Provider Last Rate Last Dose  . acetaminophen (TYLENOL) tablet 650 mg  650 mg Oral Q6H PRN Alexis Hugelmeyer, DO       Or  . acetaminophen (TYLENOL) suppository 650 mg  650 mg Rectal Q6H PRN Alexis  Hugelmeyer, DO      . albuterol (PROVENTIL) (2.5 MG/3ML) 0.083% nebulizer solution 2.5 mg  2.5 mg Nebulization Q6H PRN Alexis Hugelmeyer, DO      . atorvastatin (LIPITOR) tablet 40 mg  40 mg Oral QHS Alexis Hugelmeyer, DO   40 mg at 12/08/15 2029  . benzonatate (TESSALON) capsule 100 mg  100 mg Oral TID Alexis Hugelmeyer, DO   100 mg at 12/09/15 1706  . bisacodyl (DULCOLAX) EC tablet 5 mg  5 mg Oral Daily PRN Alexis Hugelmeyer, DO   5 mg at 12/08/15 8185  . budesonide (PULMICORT) nebulizer solution 0.25 mg  0.25 mg Nebulization BID Loletha Grayer, MD   0.25 mg at 12/09/15 0736  . enoxaparin (LOVENOX) injection 40 mg  40 mg Subcutaneous Q24H Alexis Hugelmeyer, DO   40 mg at 12/09/15 0015  . guaiFENesin (MUCINEX) 12 hr tablet 600 mg  600 mg Oral BID Alexis Hugelmeyer, DO   600 mg at 12/09/15 0844  . heparin lock flush 100 unit/mL  500 Units Intracatheter Daily PRN Cammie Sickle, MD      . heparin lock flush 100 unit/mL  250 Units Intracatheter PRN Cammie Sickle, MD      . levofloxacin (LEVAQUIN) IVPB 750 mg  750 mg Intravenous Q24H Darylene Price Swayne, RPH   750 mg  at 12/08/15 2031  . LORazepam (ATIVAN) tablet 1 mg  1 mg Oral Q8H PRN Alexis Hugelmeyer, DO   1 mg at 12/09/15 1706  . magnesium citrate solution 1 Bottle  1 Bottle Oral Once PRN Alexis Hugelmeyer, DO      . ondansetron (ZOFRAN) tablet 8 mg  8 mg Oral Q8H PRN Alexis Hugelmeyer, DO      . pantoprazole (PROTONIX) EC tablet 40 mg  40 mg Oral Daily Alexis Hugelmeyer, DO   40 mg at 12/09/15 0845  . predniSONE (DELTASONE) tablet 10 mg  10 mg Oral Q breakfast Loletha Grayer, MD   10 mg at 12/09/15 0845  . QUEtiapine (SEROQUEL) tablet 25 mg  25 mg Oral QHS Loletha Grayer, MD      . senna-docusate (Senokot-S) tablet 1 tablet  1 tablet Oral BID Lenis Noon, [facility]   1 tablet at 12/09/15 1696  . sodium chloride flush (NS) 0.9 % injection 10 mL  10 mL Intracatheter PRN Cammie Sickle, MD      . sodium chloride flush (NS) 0.9 %  injection 3 mL  3 mL Intracatheter PRN Cammie Sickle, MD      . tamsulosin (FLOMAX) capsule 0.4 mg  0.4 mg Oral QPC breakfast Alexis Hugelmeyer, DO   0.4 mg at 12/09/15 0845  . tiotropium [facility]) inhalation capsule 18 mcg  18 mcg Inhalation Daily Alexis Hugelmeyer, DO   18 mcg at 12/09/15 0849      .  PHYSICAL EXAMINATION:  Vitals:   12/08/15 2055 12/09/15 0515  BP: 124/78 (!) 123/50  Pulse: 85 (!) 101  Resp: (!) 24 18  Temp: 98 F (36.7 C) 98 F (36.7 C)   Filed Weights   12/06/15 1922 12/07/15 0032  Weight: 133 lb (60.3 kg) 130 lb (59 kg)    GENERAL: Well-nourished well-developed; Alert, no distress and comfortable.   Home O2. 3 L. With sister.  EYES: no pallor or icterus OROPHARYNX: no thrush or ulceration. NECK: supple, no masses felt LYMPH:  no palpable lymphadenopathy in the cervical, axillary or inguinal regions LUNGS: decreased breath sounds to auscultation at bases and  No wheeze or crackles HEART/CVS: regular rate & rhythm and no murmurs; No lower extremity edema ABDOMEN: abdomen soft, non-tender and normal bowel sounds Musculoskeletal:no cyanosis of digits and no clubbing  PSYCH: alert & oriented x 3 with fluent speech NEURO: no focal motor/sensory deficits SKIN:  no rashes or significant lesions  LABORATORY DATA:  I have reviewed the data as listed Lab Results  Component Value Date   WBC 9.1 12/08/2015   HGB 10.7 (L) 12/08/2015   HCT 31.1 (L) 12/08/2015   MCV 89.3 12/08/2015   PLT 80 (L) 12/08/2015    Recent Labs  10/31/15 1658  11/12/15 0944  12/03/15 0842 12/06/15 2010 12/07/15 0512  NA 134*  < > 138  < > 138 139 139  K 3.7  < > 3.5  < > 3.4* 3.5 4.1  CL 100*  < > 98*  < > 103 106 106  CO2 26  < > 32  < > '29 29 31  '$ GLUCOSE 150*  < > 100*  < > 113* 146* 99  BUN 17  < > 11  < > '17 15 14  '$ CREATININE 0.63  < > 0.68  < > 0.72 0.82 0.56*  CALCIUM 7.7*  < > 8.5*  < > 8.1* 7.6* 7.6*  GFRNONAA >60  < > >60  < > >60 >60 >  60  GFRAA >60  < >  >60  < > >60 >60 >60  PROT 6.0*  --  7.0  --  6.6  --   --   ALBUMIN 2.8*  --  3.0*  --  3.3*  --   --   AST 18  --  14*  --  15  --   --   ALT 11*  --  10*  --  16*  --   --   ALKPHOS 63  --  66  --  66  --   --   BILITOT 0.3  --  0.3  --  0.3  --   --   < > = values in this interval not displayed.  RADIOGRAPHIC STUDIES: I have personally reviewed the radiological images as listed and agreed with the findings in the report. Dg Chest 2 View  Result Date: 12/06/2015 CLINICAL DATA:  Shortness of breath EXAM: CHEST  2 VIEW COMPARISON:  10/31/2015 FINDINGS: COPD with diffuse airway thickening, emphysematous change and hyperinflation. Subtle patchy right upper lobe density. No edema or effusion. No pneumothorax. Normal heart size and aortic contours. Spondyloarthropathy with diffuse thoracic ankylosis. IMPRESSION: 1. Possible right upper lobe bronchopneumonia. Followup PA and lateral chest X-ray is recommended in 3-4 weeks following trial of antibiotic therapy to ensure resolution. 2.  Emphysema. (ICD10-J43.9) and chronic bronchitis. 3. Spondyloarthropathy and diffuse thoracic ankylosis. Electronically Signed   By: Monte Fantasia M.D.   On: 12/06/2015 20:49   Dg Hip Unilat With Pelvis 2-3 Views Right  Result Date: 12/07/2015 CLINICAL DATA:  Pain.  History of lung carcinoma EXAM: DG HIP (WITH OR WITHOUT PELVIS) 2-3V RIGHT COMPARISON:  None. FINDINGS: Frontal pelvis as well as frontal and lateral right hip images were obtained. There is moderate osteoarthritic change in both hip joints, symmetric. There is no apparent fracture or dislocation. No erosive change. Bones are somewhat osteoporotic. No blastic or lytic bone lesions evident. IMPRESSION: Bones somewhat osteoporotic. Symmetric osteoarthritic change in both hip joints. No acute fracture or dislocation. No blastic or lytic bone lesions evident. Electronically Signed   By: Lowella Grip III M.D.   On: 12/07/2015 11:10    ASSESSMENT & PLAN:    # 82 year old male patient history of metastatic non-small cell lung cancer/COPD on home O2- currently on palliative chemotherapy admitted to the hospital for worsening shortness of breath cough.  # Worsening shortness of breath cough- multifactorial COPD/chest x-ray possible pneumonia/ underlying lung cancer/anemia- improving.  # Anemia status post transfusion stable.  # Metastatic non-small cell lung cancer- long discussion the patient and his sister regarding the pros and cons of ongoing chemotherapy. Given his multiple medical problems/frequent admission the hospital/poor baseline lung function- his tolerance to chemotherapy is poor- I suspected breast the response rates would be in the order of 10-20%. I would recommend hospice.  Patient's sister is inclined towards hospice. Patient is open to the idea of hospice-but not made his decision yet  # Recommend follow-up in the clinic in approximately 1 week/ to have further discussion regarding hospice.  # October plan was discussed with Dr. Leslye Peer.     Cammie Sickle, MD 12/09/2015 6:08 PM

## 2015-12-09 NOTE — Progress Notes (Signed)
[facility] referral received from [facility]. Timothy Ali is an 82 year old man with metastatic non small cell lung cancer. Chronic oxygen use. Admitted to [facility] on 8/20 for evaluation of worsening shortness of breath and cough. He has been treated for pneumonia with Iv antibiotics and has also received a unit of blood r/t pancytopenia. Plans for further chemo are uncertain at this time. Current plan is for discharge home tomorrow. Patient lives at home alone, he does receive help from his sister Timothy Ali. Patient seen lying in bed, alert with c/o not sleeping well and ready to go home. He was pleasant and interactive with Probation officer. Daughter Timothy Ali in during visit, [facility] services explained, brochure and contact number left with patient. Thank you, Flo Shanks RN, BSN, [place], hospital liaison [phone]

## 2015-12-09 NOTE — Progress Notes (Signed)
Patient ID: Timothy Ali, male   DOB: [birth year], 82 y.o.   MRN: [id number]  [facility] PROGRESS NOTE  Timothy Ali OBS:[id number] DOB: May 10, 1929 DOA: 12/06/2015 PCP: Albina Billet, MD  HPI/Subjective: Patient seen earlier today. One of his sisters at the bedside very concerned that he hasn't slept in 2 nights. Patient once to get out of the hospital. As per the sister, he was having some hallucinations thinking that people were talking about him. I had to focus him quite a few times while I was talking with him.  Objective: Vitals:   12/08/15 2055 12/09/15 0515  BP: 124/78 (!) 123/50  Pulse: 85 (!) 101  Resp: (!) 24 18  Temp: 98 F (36.7 C) 98 F (36.7 C)    Filed Weights   12/06/15 1922 12/07/15 0032  Weight: 60.3 kg (133 lb) 59 kg (130 lb)    ROS: Review of Systems  Constitutional: Negative for chills and fever.  Eyes: Negative for blurred vision.  Respiratory: Positive for shortness of breath. Negative for cough and wheezing.   Cardiovascular: Negative for chest pain.  Gastrointestinal: Negative for abdominal pain, constipation, diarrhea, nausea and vomiting.  Genitourinary: Negative for dysuria.  Musculoskeletal: Positive for joint pain.  Neurological: Negative for dizziness and headaches.   Exam: Physical Exam  Constitutional: He is oriented to person, place, and time.  HENT:  Nose: No mucosal edema.  Mouth/Throat: No oropharyngeal exudate or posterior oropharyngeal edema.  Eyes: Conjunctivae, EOM and lids are normal. Pupils are equal, round, and reactive to light.  Neck: No JVD present. Carotid bruit is not present. No edema present. No thyroid mass and no thyromegaly present.  Cardiovascular: S1 normal and S2 normal.  Exam reveals no gallop.   No murmur heard. Pulses:      Dorsalis pedis pulses are 2+ on the right side, and 2+ on the left side.  Respiratory: No respiratory distress. He has decreased breath sounds in the right lower field and the left  lower field. He has wheezes in the right lower field and the left lower field. He has no rhonchi. He has no rales.  GI: Soft. Bowel sounds are normal. There is no tenderness.  Musculoskeletal:       Right ankle: He exhibits no swelling.       Left ankle: He exhibits no swelling.  Right hip good range of motion. Able to straight leg raise without a problem. He points to the pain in his right groin area.  Lymphadenopathy:    He has no cervical adenopathy.  Neurological: He is alert and oriented to person, place, and time. No cranial nerve deficit.  Skin: Skin is warm. No rash noted. Nails show no clubbing.  Psychiatric: He has a normal mood and affect.      Data Reviewed: Basic Metabolic Panel:  Recent Labs Lab 12/03/15 0842 12/06/15 2010 12/07/15 0512  NA 138 139 139  K 3.4* 3.5 4.1  CL 103 106 106  CO2 '29 29 31  '$ GLUCOSE 113* 146* 99  BUN '17 15 14  '$ CREATININE 0.72 0.82 0.56*  CALCIUM 8.1* 7.6* 7.6*   Liver Function Tests:  Recent Labs Lab 12/03/15 0842  AST 15  ALT 16*  ALKPHOS 66  BILITOT 0.3  PROT 6.6  ALBUMIN 3.3*   CBC:  Recent Labs Lab 12/03/15 0842 12/06/15 2010 12/07/15 0512 12/08/15 0412  WBC 3.0* 3.5* 4.0 9.1  NEUTROABS 1.3* 2.9  --  8.0*  HGB 9.7* 7.6* 7.8* 10.7*  HCT  28.5* 22.8* 23.3* 31.1*  MCV 87.6 88.0 89.0 89.3  PLT 82* 85* 84* 80*   Cardiac Enzymes:  Recent Labs Lab 12/06/15 2010  TROPONINI <0.03   BNP (last 3 results)  Recent Labs  10/29/15 1342 10/31/15 1658  BNP 47.0 65.0      Recent Results (from the past 240 hour(s))  Blood culture (routine x 2)     Status: None (Preliminary result)   Collection Time: 12/06/15 10:01 PM  Result Value Ref Range Status   Specimen Description BLOOD LEFT ASSIST CONTROL  Final   Special Requests   Final    BOTTLES DRAWN AEROBIC AND ANAEROBIC 25CCAERO,25CCANA   Culture NO GROWTH 3 DAYS  Final   Report Status PENDING  Incomplete  Blood culture (routine x 2)     Status: None (Preliminary  result)   Collection Time: 12/06/15 10:01 PM  Result Value Ref Range Status   Specimen Description BLOOD RIGHT FOREARM  Final   Special Requests   Final    BOTTLES DRAWN AEROBIC AND ANAEROBIC [place]   Culture NO GROWTH 3 DAYS  Final   Report Status PENDING  Incomplete  Culture, sputum-assessment     Status: None (Preliminary result)   Collection Time: 12/07/15 12:25 AM  Result Value Ref Range Status   Specimen Description SPUTUM  Final   Special Requests Immunocompromised  Final   Sputum evaluation   Final    Sputum specimen not acceptable for testing.  Please recollect.   SPOKE TO [place] 12/07/15 @ 2203  [place]    Report Status PENDING  Incomplete     Scheduled Meds: . atorvastatin  40 mg Oral QHS  . benzonatate  100 mg Oral TID  . budesonide (PULMICORT) nebulizer solution  0.25 mg Nebulization BID  . enoxaparin (LOVENOX) injection  40 mg Subcutaneous Q24H  . guaiFENesin  600 mg Oral BID  . levofloxacin (LEVAQUIN) IV  750 mg Intravenous Q24H  . pantoprazole  40 mg Oral Daily  . predniSONE  10 mg Oral Q breakfast  . QUEtiapine  25 mg Oral QHS  . senna-docusate  1 tablet Oral BID  . tamsulosin  0.4 mg Oral QPC breakfast  . tiotropium  18 mcg Inhalation Daily    Assessment/Plan:  1. Pneumonia with toxic granulation seen on CBC. Continue IV Levaquin. So for blood cultures negative. Sputum culture not enough sputum. 2. Hallucinations and not sleeping. Start Seroquel 25 mg at night. Hopefully mental status will be better. Patient does live home alone. Family unable to stay with him full time. I spoke with social work to set up [facility] with conversion to hospice in the future. 3. History of lung cancer with recent chemotherapy. Pancytopenia secondary to chemotherapy. Case discussed with oncologist. Further follow-up as outpatient to determine whether or not the patient is a candidate for continued chemotherapy or not. 4. Chronic respiratory failure on 3 L oxygen.  Taper prednisone. Nebulizer treatments. Add budesonide nebulizers. 5. BPH on Flomax 6. Hyperlipidemia unspecified on atorvastatin 7. GERD on Protonix 8. Right groin pain likely lateral femoral cutaneous nerve impingement. Physical therapy should help. 9. Moderate malnutrition.  Code Status:     Code Status Orders        Start     Ordered   12/07/15 0025  Limited resuscitation (code)  Continuous    Question Answer Comment  In the event of cardiac or respiratory ARREST: Initiate Code Blue, Call Rapid Response No   In the event of cardiac or  respiratory ARREST: Perform CPR No   In the event of cardiac or respiratory ARREST: Perform Intubation/Mechanical Ventilation Yes   In the event of cardiac or respiratory ARREST: Use NIPPV/BiPAp only if indicated Yes   In the event of cardiac or respiratory ARREST: Administer ACLS medications if indicated No   In the event of cardiac or respiratory ARREST: Perform Defibrillation or Cardioversion if indicated No      12/07/15 0024    Code Status History    Date Active Date Inactive Code Status Order ID Comments User Context   10/31/2015  9:55 PM 11/02/2015  6:04 PM Full Code [id number]  Henreitta Leber, MD ED   08/19/2015  4:01 PM 08/21/2015  2:32 PM Full Code [id number]  Dustin Flock, MD ED   10/24/2014 12:48 AM 10/25/2014  5:18 PM Full Code [id number]  Juluis Mire, MD Inpatient    Advance Directive Documentation   Flowsheet Row Most Recent Value  Type of Advance Directive  Living will, Out of facility DNR (pink MOST or yellow form)  Pre-existing out of facility DNR order (yellow form or pink MOST form)  Yellow form placed in chart (order not valid for inpatient use)  "MOST" Form in Place?  No data      Disposition Plan: If the patient sleeps the night and is not hallucinating hopefully I can get him home tomorrow.  Consultants:  Oncology  Antibiotics:  Levaquin  Time spent: 25 minutes. Spoke with the patient's sister at the  bedside.  Loletha Grayer  [employer]

## 2015-12-10 ENCOUNTER — Inpatient Hospital Stay: Payer: Medicare HMO

## 2015-12-10 ENCOUNTER — Ambulatory Visit: Payer: Medicare HMO

## 2015-12-10 DIAGNOSIS — D72829 Elevated white blood cell count, unspecified: Secondary | ICD-10-CM

## 2015-12-10 DIAGNOSIS — F419 Anxiety disorder, unspecified: Secondary | ICD-10-CM

## 2015-12-10 LAB — CBC
HEMATOCRIT: 29.4 % — AB (ref 40.0–52.0)
HEMATOCRIT: 29.3 % — AB (ref 40.0–52.0)
HEMOGLOBIN: 9.9 g/dL — AB (ref 13.0–18.0)
Hemoglobin: 9.8 g/dL — ABNORMAL LOW (ref 13.0–18.0)
MCH: 29.6 pg (ref 26.0–34.0)
MCH: 29.5 pg (ref 26.0–34.0)
MCHC: 33.3 g/dL (ref 32.0–36.0)
MCHC: 33.6 g/dL (ref 32.0–36.0)
MCV: 88.8 fL (ref 80.0–100.0)
MCV: 87.6 fL (ref 80.0–100.0)
PLATELETS: 41 10*3/uL — AB (ref 150–440)
Platelets: 38 10*3/uL — ABNORMAL LOW (ref 150–440)
RBC: 3.31 MIL/uL — AB (ref 4.40–5.90)
RBC: 3.35 MIL/uL — AB (ref 4.40–5.90)
RDW: 17.3 % — AB (ref 11.5–14.5)
RDW: 17.5 % — AB (ref 11.5–14.5)
WBC: 18.5 10*3/uL — AB (ref 3.8–10.6)
WBC: 20.9 10*3/uL — ABNORMAL HIGH (ref 3.8–10.6)

## 2015-12-10 MED ORDER — PREDNISONE 10 MG PO TABS: ORAL_TABLET | ORAL | 0 refills | Status: AC

## 2015-12-10 MED ORDER — QUETIAPINE FUMARATE 25 MG PO TABS: 25.0000 mg | ORAL_TABLET | Freq: Every day | ORAL | 0 refills | Status: DC

## 2015-12-10 MED ORDER — LORAZEPAM 1 MG PO TABS: 1.0000 mg | ORAL_TABLET | Freq: Four times a day (QID) | ORAL | 0 refills | Status: DC | PRN

## 2015-12-10 MED ORDER — LEVOFLOXACIN 750 MG PO TABS: 750.0000 mg | ORAL_TABLET | Freq: Every day | ORAL | 0 refills | Status: AC

## 2015-12-10 NOTE — Discharge Instructions (Signed)

## 2015-12-10 NOTE — Discharge Summary (Signed)
Timothy Ali NAME: Timothy Ali    MR#:  [id number]  DATE OF BIRTH:  21-Jul-1929  DATE OF ADMISSION:  12/06/2015 ADMITTING PHYSICIAN: Timothy Bridge, DO  DATE OF DISCHARGE: 12/10/2015 10:49 AM  PRIMARY CARE PHYSICIAN: Timothy Billet, MD    ADMISSION DIAGNOSIS:  Community acquired pneumonia [J18.9]  DISCHARGE DIAGNOSIS:  Active Problems:   HCAP (healthcare-associated pneumonia)   Antineoplastic chemotherapy induced anemia   Malnutrition of moderate degree   SECONDARY DIAGNOSIS:   Past Medical History:  Diagnosis Date  . Asthma   . Cancer ([place])    Left lung  . COPD (chronic obstructive pulmonary disease) ([place])   . Generalized anxiety disorder   . Major depressive disorder, recurrent, severe with psychotic features ([place])   . Major depressive disorder, recurrent, severe with psychotic features ([place])   . Persistent disorder of initiating or maintaining sleep   . Persistent disorder of initiating or maintaining sleep     HOSPITAL COURSE:   1. Pneumonia with toxic granulation seen on CBC. Patient was on high-dose Levaquin here. But cultures negative. Sputum culture not enough to culture. Patient improved on Levaquin and will continue for a few more doses. 2. Hallucinations and not sleeping. Patient did well with Seroquel 25 mg at night. Continue that at home. Patient's mental status today is much better than yesterday. Home health with [facility] with conversion to hospice in the future. 3. Stage IV lung cancer with recent chemotherapy. Pancytopenia secondary to chemotherapy. Follow-up with oncology as outpatient. Further discussions on whether or not the patient is a candidate for further chemotherapy will be discussed as outpatient. 4. Chronic respiratory failure on 3 L of oxygen. Taper prednisone to off. Continue nebulizers and his usual inhalers at home. 5. BPH on Flomax 6. Hyperlipidemia unspecified on atorvastatin 7. GERD  on Protonix 8. Right groin pain likely lateral femoral cutaneous nerve impingement. Physical therapy as outpatient 9. Moderate malnutrition. 10. Anemia secondary to chemotherapy. Patient was transfused 1 unit of blood with good response.  High risk for cardiopulmonary arrest.  DISCHARGE CONDITIONS:   [place]   CONSULTS OBTAINED:  Treatment Team:  Timothy Sickle, MD  DRUG ALLERGIES:   Allergies  Allergen Reactions  . Penicillins Other (See Comments)    Reaction:  Unknown     DISCHARGE MEDICATIONS:   Discharge Medication List as of 12/10/2015 10:20 AM    START taking these medications   Details  QUEtiapine (SEROQUEL) 25 MG tablet Take 1 tablet (25 mg total) by mouth at bedtime., Starting Thu 12/10/2015, Print      CONTINUE these medications which have CHANGED   Details  levofloxacin (LEVAQUIN) 750 MG tablet Take 1 tablet (750 mg total) by mouth daily., Starting Thu 12/10/2015, Print    LORazepam (ATIVAN) 1 MG tablet Take 1 tablet (1 mg total) by mouth every 6 (six) hours as needed for anxiety., Starting Thu 12/10/2015, Print    predniSONE (DELTASONE) 10 MG tablet One tab daily for two days; 1/2 tablet daily for four days, Print      CONTINUE these medications which have NOT CHANGED   Details  albuterol (PROVENTIL HFA;VENTOLIN HFA) 108 (90 BASE) MCG/ACT inhaler Inhale 2 puffs into the lungs every 4 (four) hours as needed for wheezing or shortness of breath. , Until Discontinued, Historical Med    atorvastatin (LIPITOR) 40 MG tablet Take 40 mg by mouth at bedtime. , Until Discontinued, Historical Med    BREO ELLIPTA 100-25 MCG/INH  AEPB Inhale 1 puff into the lungs daily. , Until Discontinued, Historical Med    esomeprazole (NEXIUM) 40 MG capsule Take 40 mg by mouth daily. , Until Discontinued, Historical Med    guaiFENesin (MUCINEX) 600 MG 12 hr tablet Take 1 tablet (600 mg total) by mouth 2 (two) times daily., Starting 08/21/2015, Until Discontinued, Normal     tamsulosin (FLOMAX) 0.4 MG CAPS capsule Take 0.4 mg by mouth daily after breakfast. , Until Discontinued, Historical Med    tiotropium (SPIRIVA) 18 MCG inhalation capsule Place 18 mcg into inhaler and inhale daily. , Until Discontinued, Historical Med    ondansetron (ZOFRAN) 8 MG tablet Take 8 mg by mouth every 8 (eight) hours as needed for nausea. Reported on 10/19/2015, Starting 10/05/2015, Until Discontinued, Historical Med         DISCHARGE INSTRUCTIONS:   Follow-up with oncology 1 week follow-up PMD 2 weeks  If you experience worsening of your admission symptoms, develop shortness of breath, life threatening emergency, suicidal or homicidal thoughts you must seek medical attention immediately by calling 911 or calling your MD immediately  if symptoms less severe.  You Must read complete instructions/literature along with all the possible adverse reactions/side effects for all the Medicines you take and that have been prescribed to you. Take any new Medicines after you have completely understood and accept all the possible adverse reactions/side effects.   Please note  You were cared for by a hospitalist during your hospital stay. If you have any questions about your discharge medications or the care you received while you were in the hospital after you are discharged, you can call the unit and asked to speak with the hospitalist on call if the hospitalist that took care of you is not available. Once you are discharged, your primary care physician will handle any further medical issues. Please note that NO REFILLS for any discharge medications will be authorized once you are discharged, as it is imperative that you return to your primary care physician (or establish a relationship with a primary care physician if you do not have one) for your aftercare needs so that they can reassess your need for medications and monitor your lab values.    Today   CHIEF COMPLAINT:   Chief Complaint   Patient presents with  . Shortness of Breath    HISTORY OF PRESENT ILLNESS:  Timothy Ali  is a 82 y.o. male with a known history of Stage IV lung cancer presents with shortness of breath and found to have a pneumonia.   VITAL SIGNS:  Blood pressure (!) 142/64, pulse 85, temperature 97.9 F (36.6 C), temperature source Oral, resp. rate 20, height '5\' 7"'$  (1.702 m), weight 59 kg (130 lb), SpO2 96 %.    PHYSICAL EXAMINATION:  GENERAL:  82 y.o.-year-old patient lying in the bed with no acute distress.  EYES: Pupils equal, round, reactive to light and accommodation. No scleral icterus. Extraocular muscles intact.  HEENT: Head atraumatic, normocephalic. Oropharynx and nasopharynx clear.  NECK:  Supple, no jugular venous distention. No thyroid enlargement, no tenderness.  LUNGS: Decreased  breath sounds bilaterally, no wheezing, rales,rhonchi or crepitation. No use of accessory muscles of respiration.  CARDIOVASCULAR: S1, S2 normal. No murmurs, rubs, or gallops.  ABDOMEN: Soft, non-tender, non-distended. Bowel sounds present. No organomegaly or mass.  EXTREMITIES: No pedal edema, cyanosis, or clubbing.  NEUROLOGIC: Cranial nerves II through XII are intact. Muscle strength 5/5 in all extremities. Sensation intact. Gait not checked.  PSYCHIATRIC: The  patient is alert and oriented x 3.  SKIN: No obvious rash, lesion, or ulcer.   DATA REVIEW:   CBC  Recent Labs Lab 12/10/15 0843  WBC 20.9*  HGB 9.9*  HCT 29.3*  PLT 38*    Chemistries   Recent Labs Lab 12/07/15 0512  NA 139  K 4.1  CL 106  CO2 31  GLUCOSE 99  BUN 14  CREATININE 0.56*  CALCIUM 7.6*    Cardiac Enzymes  Recent Labs Lab 12/06/15 2010  TROPONINI <0.03    Microbiology Results  Results for orders placed or performed during the hospital encounter of 12/06/15  Blood culture (routine x 2)     Status: None (Preliminary result)   Collection Time: 12/06/15 10:01 PM  Result Value Ref Range Status    Specimen Description BLOOD LEFT ASSIST CONTROL  Final   Special Requests   Final    BOTTLES DRAWN AEROBIC AND ANAEROBIC 25CCAERO,25CCANA   Culture NO GROWTH 4 DAYS  Final   Report Status PENDING  Incomplete  Blood culture (routine x 2)     Status: None (Preliminary result)   Collection Time: 12/06/15 10:01 PM  Result Value Ref Range Status   Specimen Description BLOOD RIGHT FOREARM  Final   Special Requests   Final    BOTTLES DRAWN AEROBIC AND ANAEROBIC [place]   Culture NO GROWTH 4 DAYS  Final   Report Status PENDING  Incomplete  Culture, sputum-assessment     Status: None (Preliminary result)   Collection Time: 12/07/15 12:25 AM  Result Value Ref Range Status   Specimen Description SPUTUM  Final   Special Requests Immunocompromised  Final   Sputum evaluation   Final    Sputum specimen not acceptable for testing.  Please recollect.   SPOKE TO [place] 12/07/15 @ 2203  [place]    Report Status PENDING  Incomplete    Management plans discussed with the patient, family and they are in agreement.  CODE STATUS:  Code Status History    Date Active Date Inactive Code Status Order ID Comments User Context   12/07/2015 12:24 AM 12/10/2015  2:09 PM Partial Code [id number]  Timothy Bridge, DO Inpatient   10/31/2015  9:55 PM 11/02/2015  6:04 PM Full Code [id number]  Henreitta Leber, MD ED   08/19/2015  4:01 PM 08/21/2015  2:32 PM Full Code [id number]  Dustin Flock, MD ED   10/24/2014 12:48 AM 10/25/2014  5:18 PM Full Code [id number]  Juluis Mire, MD Inpatient    Questions for Most Recent Historical Code Status (Order [id number])    Question Answer Comment   In the event of cardiac or respiratory ARREST: Initiate Code Blue, Call Rapid Response No    In the event of cardiac or respiratory ARREST: Perform CPR No    In the event of cardiac or respiratory ARREST: Perform Intubation/Mechanical Ventilation Yes    In the event of cardiac or respiratory ARREST: Use NIPPV/BiPAp only if indicated  Yes    In the event of cardiac or respiratory ARREST: Administer ACLS medications if indicated No    In the event of cardiac or respiratory ARREST: Perform Defibrillation or Cardioversion if indicated No         Advance Directive Documentation   Flowsheet Row Most Recent Value  Type of Advance Directive  Living will, Out of facility DNR (pink MOST or yellow form)  Pre-existing out of facility DNR order (yellow form or pink MOST form)  Yellow form placed in chart (  order not valid for inpatient use)  "MOST" Form in Place?  No data      TOTAL TIME TAKING CARE OF THIS PATIENT: 35 minutes.    Loletha Grayer M.D on 12/10/2015 at 3:03 PM  Between 7am to 6pm - Pager - [phone]  After 6pm go to [email or web address] - password [email or web address]  [facility] Office  [phone]  CC: Primary care physician; Timothy Billet, MD

## 2015-12-10 NOTE — Progress Notes (Signed)
Pt for discharge home. Alert/ responsive. No resp distress on o2 3l  Also at home. Discharge instructions discussed with pt and sister. prec given and discussed. Home meds also. Diet / activity and f/u discussed. Verbalized understanding. Home at this time via w/c w/o c/o.

## 2015-12-10 NOTE — Care Management Important Message (Signed)
Important Message  Patient Details  Name: ULISSES VONDRAK MRN: [id number] Date of Birth: 1929/10/30   Medicare Important Message Given:  Yes    Shelbie Ammons, RN 12/10/2015, 8:12 AM

## 2015-12-10 NOTE — Progress Notes (Signed)
[place] NOTE  Patient Care Team: Albina Billet, MD as PCP - General (Internal Medicine) Cammie Sickle, MD as Consulting Physician (Hematology and Oncology)    CC: Coughing improved. Shortness of breath this stable. Denies any hemoptysis. He states that he slept well last night. However continues to have episodes of anxiety. He is eager to go home. He is dressed up. No fevers.   MEDICAL HISTORY:  Past Medical History:  Diagnosis Date  . Asthma   . Cancer ([place])    Left lung  . COPD (chronic obstructive pulmonary disease) ([place])   . Generalized anxiety disorder   . Major depressive disorder, recurrent, severe with psychotic features ([place])   . Major depressive disorder, recurrent, severe with psychotic features ([place])   . Persistent disorder of initiating or maintaining sleep   . Persistent disorder of initiating or maintaining sleep     SURGICAL HISTORY: Past Surgical History:  Procedure Laterality Date  . HEMORROIDECTOMY    . LUNG SURGERY      SOCIAL HISTORY: Social History   Social History  . Marital status: Widowed    Spouse name: N/A  . Number of children: N/A  . Years of education: N/A   Occupational History  . Not on file.   Social History Main Topics  . Smoking status: Former Research scientist (life sciences)  . Smokeless tobacco: Never Used     Comment: quit 18 years ago  . Alcohol use No  . Drug use: No  . Sexual activity: No   Other Topics Concern  . Not on file   Social History Narrative  . No narrative on file    FAMILY HISTORY: Family History  Problem Relation Age of Onset  . COPD Mother   . Lung cancer Father   . Kidney disease Neg Hx   . Prostate cancer Neg Hx     ALLERGIES:  is allergic to penicillins.  MEDICATIONS:  Current Facility-Administered Medications  Medication Dose Route Frequency Provider Last Rate Last Dose  . acetaminophen (TYLENOL) tablet 650 mg  650 mg Oral Q6H PRN Alexis Hugelmeyer, DO       Or  . acetaminophen  (TYLENOL) suppository 650 mg  650 mg Rectal Q6H PRN Alexis Hugelmeyer, DO      . albuterol (PROVENTIL) (2.5 MG/3ML) 0.083% nebulizer solution 2.5 mg  2.5 mg Nebulization Q6H PRN Alexis Hugelmeyer, DO      . atorvastatin (LIPITOR) tablet 40 mg  40 mg Oral QHS Alexis Hugelmeyer, DO   40 mg at 12/09/15 2047  . benzonatate (TESSALON) capsule 100 mg  100 mg Oral TID Alexis Hugelmeyer, DO   100 mg at 12/09/15 2046  . bisacodyl (DULCOLAX) EC tablet 5 mg  5 mg Oral Daily PRN Alexis Hugelmeyer, DO   5 mg at 12/08/15 3235  . budesonide (PULMICORT) nebulizer solution 0.25 mg  0.25 mg Nebulization BID Loletha Grayer, MD   0.25 mg at 12/10/15 0749  . enoxaparin (LOVENOX) injection 40 mg  40 mg Subcutaneous Q24H Alexis Hugelmeyer, DO   40 mg at 12/09/15 2047  . guaiFENesin (MUCINEX) 12 hr tablet 600 mg  600 mg Oral BID Alexis Hugelmeyer, DO   600 mg at 12/09/15 2046  . heparin lock flush 100 unit/mL  500 Units Intracatheter Daily PRN Cammie Sickle, MD      . heparin lock flush 100 unit/mL  250 Units Intracatheter PRN Cammie Sickle, MD      . levofloxacin (LEVAQUIN) IVPB 750 mg  750 mg Intravenous Q24H Lenis Noon, RPH   750 mg at 12/09/15 2049  . LORazepam (ATIVAN) tablet 1 mg  1 mg Oral Q8H PRN Alexis Hugelmeyer, DO   1 mg at 12/09/15 1706  . magnesium citrate solution 1 Bottle  1 Bottle Oral Once PRN Alexis Hugelmeyer, DO      . ondansetron (ZOFRAN) tablet 8 mg  8 mg Oral Q8H PRN Alexis Hugelmeyer, DO      . pantoprazole (PROTONIX) EC tablet 40 mg  40 mg Oral Daily Alexis Hugelmeyer, DO   40 mg at 12/09/15 0845  . predniSONE (DELTASONE) tablet 10 mg  10 mg Oral Q breakfast Loletha Grayer, MD   10 mg at 12/09/15 0845  . QUEtiapine (SEROQUEL) tablet 25 mg  25 mg Oral QHS Loletha Grayer, MD   25 mg at 12/09/15 2047  . senna-docusate (Senokot-S) tablet 1 tablet  1 tablet Oral BID Lenis Noon, [facility]   1 tablet at 12/09/15 2046  . sodium chloride flush (NS) 0.9 % injection 10 mL  10 mL  Intracatheter PRN Cammie Sickle, MD      . sodium chloride flush (NS) 0.9 % injection 3 mL  3 mL Intracatheter PRN Cammie Sickle, MD      . tamsulosin (FLOMAX) capsule 0.4 mg  0.4 mg Oral QPC breakfast Alexis Hugelmeyer, DO   0.4 mg at 12/09/15 0845  . tiotropium [facility]) inhalation capsule 18 mcg  18 mcg Inhalation Daily Alexis Hugelmeyer, DO   18 mcg at 12/09/15 0849      .  PHYSICAL EXAMINATION:  Vitals:   12/10/15 0539 12/10/15 0810  BP: (!) 144/65 (!) 142/64  Pulse: 83 85  Resp:  20  Temp: 97.7 F (36.5 C) 97.9 F (36.6 C)   Filed Weights   12/06/15 1922 12/07/15 0032  Weight: 133 lb (60.3 kg) 130 lb (59 kg)    GENERAL: Well-nourished well-developed; Alert, no distress and comfortable.   Home O2. 3 L. He is alone dressed up anxious to go home  EYES: no pallor or icterus OROPHARYNX: no thrush or ulceration. NECK: supple, no masses felt LYMPH:  no palpable lymphadenopathy in the cervical, axillary or inguinal regions LUNGS: decreased breath sounds to auscultation at bases and  No wheeze or crackles HEART/CVS: regular rate & rhythm and no murmurs; No lower extremity edema ABDOMEN: abdomen soft, non-tender and normal bowel sounds Musculoskeletal:no cyanosis of digits and no clubbing  PSYCH: alert & oriented x 3 with fluent speech NEURO: no focal motor/sensory deficits SKIN:  no rashes or significant lesions  LABORATORY DATA:  I have reviewed the data as listed Lab Results  Component Value Date   WBC 18.5 (H) 12/10/2015   HGB 9.8 (L) 12/10/2015   HCT 29.4 (L) 12/10/2015   MCV 88.8 12/10/2015   PLT 41 (L) 12/10/2015    Recent Labs  10/31/15 1658  11/12/15 0944  12/03/15 0842 12/06/15 2010 12/07/15 0512  NA 134*  < > 138  < > 138 139 139  K 3.7  < > 3.5  < > 3.4* 3.5 4.1  CL 100*  < > 98*  < > 103 106 106  CO2 26  < > 32  < > '29 29 31  '$ GLUCOSE 150*  < > 100*  < > 113* 146* 99  BUN 17  < > 11  < > '17 15 14  '$ CREATININE 0.63  < > 0.68  < > 0.72  0.82 0.56*  CALCIUM  7.7*  < > 8.5*  < > 8.1* 7.6* 7.6*  GFRNONAA >60  < > >60  < > >60 >60 >60  GFRAA >60  < > >60  < > >60 >60 >60  PROT 6.0*  --  7.0  --  6.6  --   --   ALBUMIN 2.8*  --  3.0*  --  3.3*  --   --   AST 18  --  14*  --  15  --   --   ALT 11*  --  10*  --  16*  --   --   ALKPHOS 63  --  66  --  66  --   --   BILITOT 0.3  --  0.3  --  0.3  --   --   < > = values in this interval not displayed.  RADIOGRAPHIC STUDIES: I have personally reviewed the radiological images as listed and agreed with the findings in the report. Dg Chest 2 View  Result Date: 12/06/2015 CLINICAL DATA:  Shortness of breath EXAM: CHEST  2 VIEW COMPARISON:  10/31/2015 FINDINGS: COPD with diffuse airway thickening, emphysematous change and hyperinflation. Subtle patchy right upper lobe density. No edema or effusion. No pneumothorax. Normal heart size and aortic contours. Spondyloarthropathy with diffuse thoracic ankylosis. IMPRESSION: 1. Possible right upper lobe bronchopneumonia. Followup PA and lateral chest X-ray is recommended in 3-4 weeks following trial of antibiotic therapy to ensure resolution. 2.  Emphysema. (ICD10-J43.9) and chronic bronchitis. 3. Spondyloarthropathy and diffuse thoracic ankylosis. Electronically Signed   By: Monte Fantasia M.D.   On: 12/06/2015 20:49   Dg Hip Unilat With Pelvis 2-3 Views Right  Result Date: 12/07/2015 CLINICAL DATA:  Pain.  History of lung carcinoma EXAM: DG HIP (WITH OR WITHOUT PELVIS) 2-3V RIGHT COMPARISON:  None. FINDINGS: Frontal pelvis as well as frontal and lateral right hip images were obtained. There is moderate osteoarthritic change in both hip joints, symmetric. There is no apparent fracture or dislocation. No erosive change. Bones are somewhat osteoporotic. No blastic or lytic bone lesions evident. IMPRESSION: Bones somewhat osteoporotic. Symmetric osteoarthritic change in both hip joints. No acute fracture or dislocation. No blastic or lytic bone lesions  evident. Electronically Signed   By: Lowella Grip III M.D.   On: 12/07/2015 11:10    ASSESSMENT & PLAN:   # 82 year old male patient history of metastatic non-small cell lung cancer/COPD on home O2- currently on palliative chemotherapy admitted to the hospital for worsening shortness of breath cough.  # Worsening shortness of breath cough- multifactorial COPD/chest x-ray possible pneumonia/ underlying lung cancer/anemia- improving.  Anemia status post transfusion stable.  # Leukocytosis white count 22/platelets 45- likely secondary to chemotherapy/Neulasta. Clinically I do not suspect any worsening infection/or worsening sepsis at this time. Okay to discharge from oncology stand point.   # Metastatic non-small cell lung cancer- poor candidate for further chemotherapy. Palliative care hospice discussed. We will again re- visit the issue as an outpatient.  # Difficulty sleeping/anxiety- recommend continued anxiolytic. And we will prescribe patient anxiety medications as an outpatient. Patient has appointment to follow-up with me in 2 weeks.    Cammie Sickle, MD 12/10/2015 8:12 AM

## 2015-12-10 NOTE — Progress Notes (Signed)
Patient discharged prior to follow up visit. F2F and Home Health orders faxed to [facility] referral. CMRN Jeannie Fend made aware that [facility] will be unable to admit patient to services until 8/28. Thank you. Flo Shanks RN, BSN, [facility] Liaison [phone] c

## 2015-12-11 LAB — CULTURE, BLOOD (ROUTINE X 2)
CULTURE: NO GROWTH
CULTURE: NO GROWTH

## 2015-12-14 LAB — EXPECTORATED SPUTUM ASSESSMENT W GRAM STAIN, RFLX TO RESP C

## 2015-12-14 NOTE — Telephone Encounter (Signed)
Patient is on multiple medications that could cause sedation and not safe to add another medication like Remeron. His primary care physician can make the changes if he would like to but I will not be adding any other medication. Please informed patient that I will not be starting him on Remeron. I cannot prescribe him the lorazepam as well since I have tried to taper this patient off any benzodiazepines. He will have to find a different physician if he wants to continue on any benzodiazepine. It is not in any his clinical interests and not safe practice.

## 2015-12-17 ENCOUNTER — Inpatient Hospital Stay: Payer: Medicare HMO

## 2015-12-17 ENCOUNTER — Telehealth: Payer: Self-pay | Admitting: *Deleted

## 2015-12-17 MED ORDER — MORPHINE SULFATE (CONCENTRATE) 20 MG/ML PO SOLN: ORAL | 0 refills | Status: DC

## 2015-12-17 NOTE — Telephone Encounter (Signed)
Asking for prescription for Roxanol for SOB. Send to [facility]

## 2015-12-22 ENCOUNTER — Ambulatory Visit: Payer: Medicare HMO | Admitting: Psychiatry

## 2015-12-24 ENCOUNTER — Other Ambulatory Visit: Payer: Medicare HMO

## 2015-12-24 ENCOUNTER — Ambulatory Visit: Payer: Medicare HMO

## 2015-12-24 ENCOUNTER — Ambulatory Visit: Payer: Medicare HMO | Admitting: Internal Medicine

## 2015-12-24 ENCOUNTER — Other Ambulatory Visit: Payer: Self-pay | Admitting: *Deleted

## 2015-12-24 MED ORDER — MORPHINE SULFATE (CONCENTRATE) 20 MG/ML PO SOLN: ORAL | 0 refills | Status: DC

## 2015-12-28 ENCOUNTER — Telehealth: Payer: Self-pay

## 2015-12-28 NOTE — Telephone Encounter (Signed)
Spoke with Coralyn Mark on call regarding pt.  Timothy Ali verbalized that her concern were addressed by on call Dr. Woodfin Ganja over the weekend.

## 2015-12-29 ENCOUNTER — Telehealth: Payer: Self-pay | Admitting: *Deleted

## 2015-12-29 ENCOUNTER — Encounter: Payer: Self-pay | Admitting: Internal Medicine

## 2015-12-29 NOTE — Telephone Encounter (Signed)
Called to state that patient had difficulty doing I & O cath and had bleeding He got anxious and called EMS who helped him cath and there was a good amount of blood in urine. Asking if they can put in a foley for a few weeks and send urine for UA

## 2015-12-29 NOTE — Telephone Encounter (Signed)
Patient has refused foley cath. Advised ok to send UA

## 2015-12-29 NOTE — Telephone Encounter (Signed)
Please forward top Dr. Jacinto Reap.  Thanks!

## 2015-12-30 ENCOUNTER — Telehealth: Payer: Self-pay | Admitting: *Deleted

## 2015-12-30 MED ORDER — CIPROFLOXACIN HCL 500 MG PO TABS: 500.0000 mg | ORAL_TABLET | Freq: Two times a day (BID) | ORAL | 0 refills | Status: AC

## 2015-12-30 NOTE — Telephone Encounter (Signed)
informed Timothy Ali with Hospice that prescription for Cipro 500 mg bid was sent to pharmacy for UA results

## 2016-01-01 ENCOUNTER — Telehealth: Payer: Self-pay | Admitting: Urology

## 2016-01-01 NOTE — Telephone Encounter (Signed)
Patient called the office this morning requesting a change in his catheter size from [facility].  He is currently receiving 16 fr and would like to change to 14 fr.  He can be reached at [phone].

## 2016-01-04 NOTE — Telephone Encounter (Signed)
Notified Michael at [facility] on Friday.  Did they get the catheter size changed for the patient?

## 2016-01-05 NOTE — Telephone Encounter (Signed)
Spoke with Legrand Como with [facility] and pt cath size was corrected in their system.

## 2016-01-11 ENCOUNTER — Telehealth: Payer: Self-pay | Admitting: *Deleted

## 2016-01-11 MED ORDER — TRAZODONE HCL 50 MG PO TABS: 50.0000 mg | ORAL_TABLET | Freq: Every day | ORAL | 0 refills | Status: AC

## 2016-01-11 NOTE — Telephone Encounter (Signed)
Per Dr B, D/c Ambien and start Trazodone 50 mg at hs PRN Lattie Haw informed and repeated back to me

## 2016-01-11 NOTE — Telephone Encounter (Signed)
States patient not sleeping well, He is on Ambien and Seroquel. Not sure what else to try him on, does not want to make him dizzy etc. Please advise.

## 2016-01-25 ENCOUNTER — Other Ambulatory Visit: Payer: Self-pay | Admitting: *Deleted

## 2016-01-25 MED ORDER — MORPHINE SULFATE (CONCENTRATE) 20 MG/ML PO SOLN: ORAL | 0 refills | Status: DC

## 2016-01-27 ENCOUNTER — Telehealth: Payer: Self-pay | Admitting: *Deleted

## 2016-01-27 MED ORDER — QUETIAPINE FUMARATE 25 MG PO TABS: 25.0000 mg | ORAL_TABLET | Freq: Two times a day (BID) | ORAL | 0 refills | Status: AC

## 2016-01-27 MED ORDER — LORAZEPAM 1 MG PO TABS: 2.0000 mg | ORAL_TABLET | Freq: Four times a day (QID) | ORAL | 0 refills | Status: AC | PRN

## 2016-01-27 NOTE — Telephone Encounter (Signed)
Asking for med changes related to his anxiety, he is currently on Lorazepam 1 mg 4 times a day and Seroquel 25 mg at HS. He is not doing well with anxiety and this leads to SOB. Asking to increase Seroquel to bid and/or increase Lorazepam to 2 mg qid. Please advise

## 2016-01-27 NOTE — Telephone Encounter (Signed)
Per Dr B OK to increase Sroquel to bid and increase Lorazepam to 2 mg q 6 h. Timothy Ali informed

## 2016-02-11 ENCOUNTER — Other Ambulatory Visit: Payer: Self-pay | Admitting: *Deleted

## 2016-02-11 MED ORDER — MORPHINE SULFATE (CONCENTRATE) 20 MG/ML PO SOLN: ORAL | 0 refills | Status: AC

## 2016-02-29 ENCOUNTER — Ambulatory Visit: Payer: Medicare HMO | Admitting: Radiation Oncology

## 2016-03-07 ENCOUNTER — Telehealth: Payer: Self-pay | Admitting: *Deleted

## 2016-03-07 NOTE — Telephone Encounter (Signed)
[facility] is being [facility] to a [facility] and they are asking if you will serve as his attending so they can continue services. Please advise

## 2016-03-07 NOTE — Telephone Encounter (Signed)
Per VO Dr B, ok to be attending, Left msg on VM for Neoma Laming

## 2016-04-04 ENCOUNTER — Telehealth: Payer: Self-pay | Admitting: *Deleted

## 2016-04-04 NOTE — Telephone Encounter (Signed)
Called to report that patient expired on 12/16/ @ 12:25 AM at the [facility]

## 2016-04-08 ENCOUNTER — Other Ambulatory Visit: Payer: Self-pay | Admitting: Nurse Practitioner

## 2016-04-18 DEATH — deceased

## 2017-08-11 IMAGING — MR MR HEAD WO/W CM
10 of 13 series · 36 of 48 positions shown · IV contrast (12 ML MULTIHANCE)
Comparison: CT head 08/19/2015.

CLINICAL DATA: Patient with known history of lung cancer reports
shortness of breath. Acute encephalopathy.

EXAM:
MRI HEAD WITHOUT AND WITH CONTRAST
TECHNIQUE: Multiplanar, multiecho pulse sequences of the brain and surrounding
structures were obtained without and with intravenous contrast.
CONTRAST:  12mL MULTIHANCE GADOBENATE DIMEGLUMINE 529 MG/ML IV SOLN

[Series 4: DWI · axial · 4.0mm · 0.94mm/px · z∈[-20,+123]mm · 4 of 43 slices shown (1 of 4)]
[im 1/43]
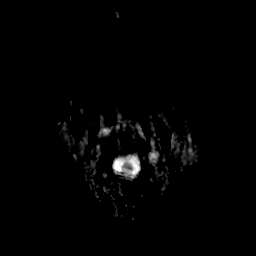
[im 15/43]
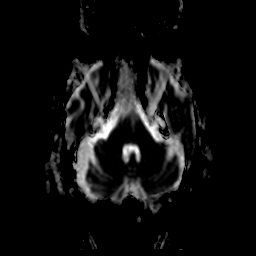
[im 29/43]
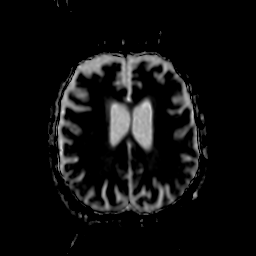
[im 43/43]
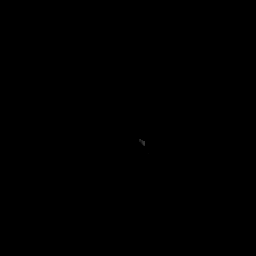

[Series 5: DWI · axial · 4.0mm · 0.94mm/px · z∈[-20,+120]mm · 4 of 42 slices shown (2 of 4)]
[im 1/42]
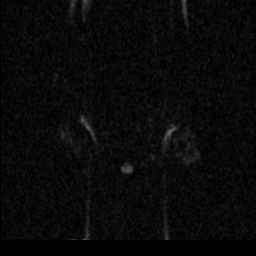
[im 14/42]
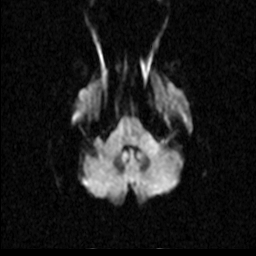
[im 28/42]
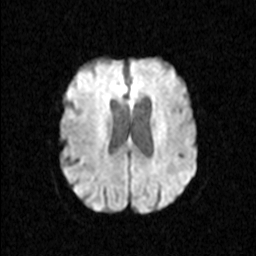
[im 42/42]
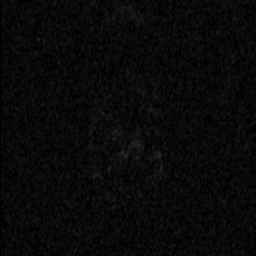

[Series 7: DWI · coronal · 5.0mm · 1.80mm/px · 4 of 41 slices shown (3 of 4)]
[im 1/41]
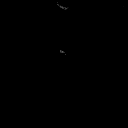
[im 14/41]
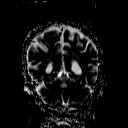
[im 27/41]
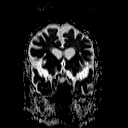
[im 41/41]
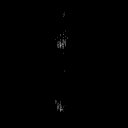

[Series 8: DWI · coronal · 5.0mm · 1.80mm/px · 3 of 35 slices shown (4 of 4)]
[im 1/35]
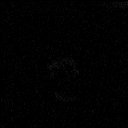
[im 18/35]
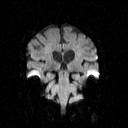
[im 35/35]
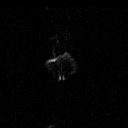

[Series 9: T2 · axial · 5.0mm · 0.45mm/px · z∈[-16,+124]mm · 3 of 27 slices shown (1 of 2)]
[im 1/27]
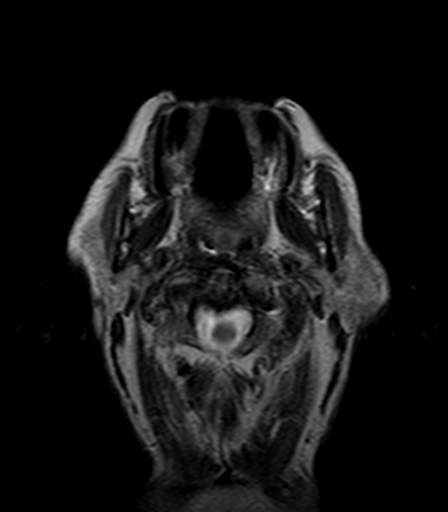
[im 14/27]
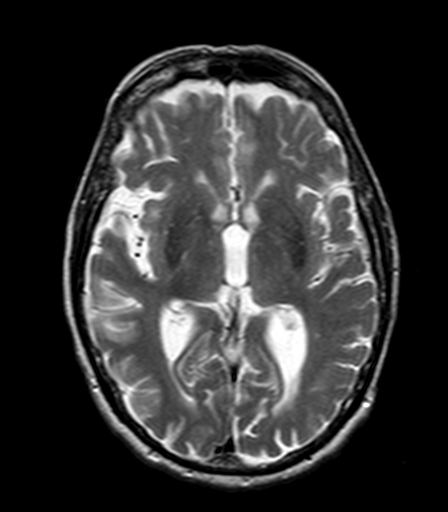
[im 27/27]
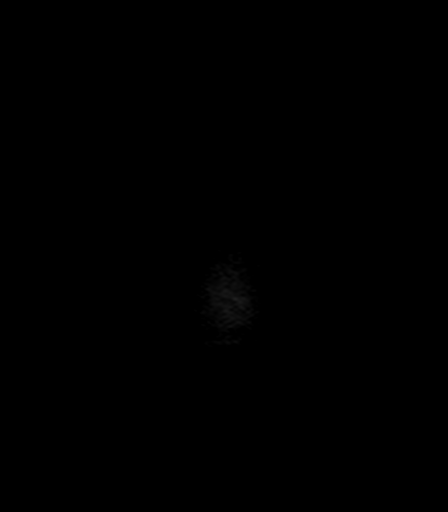

[Series 10: FLAIR · axial · 5.0mm · 0.90mm/px · z∈[-16,+125]mm · 3 of 27 slices shown]
[im 1/27]
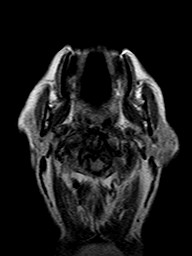
[im 14/27]
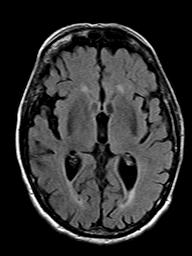
[im 27/27]
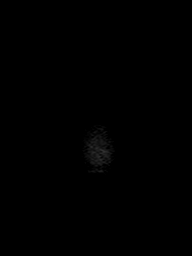

[Series 11: T2 · axial · 5.0mm · 0.45mm/px · z∈[-16,+124]mm · 3 of 27 slices shown (2 of 2)]
[im 1/27]
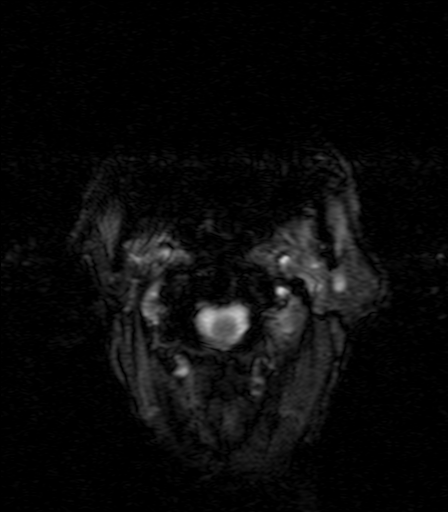
[im 14/27]
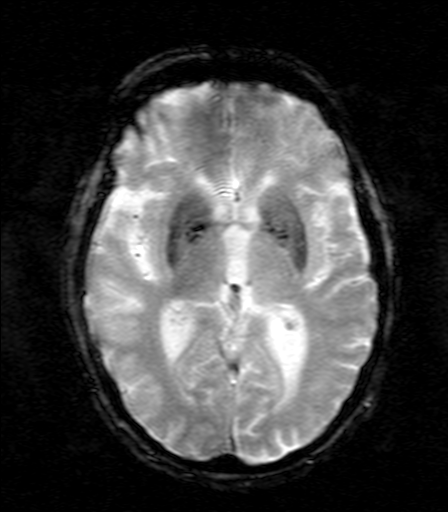
[im 27/27]
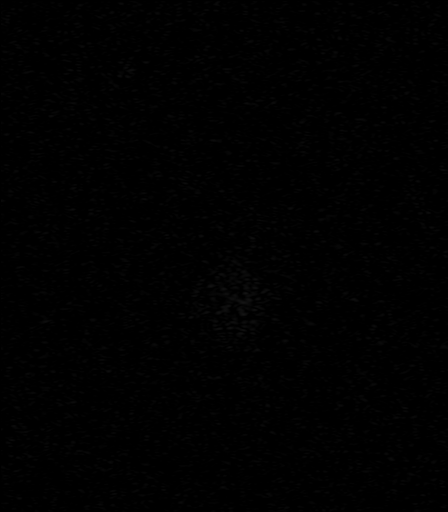

[Series 15: T1 post-contrast · axial · 3.0mm · 0.45mm/px · z∈[-19,+128]mm · 6 of 60 slices shown (1 of 3)]
[im 1/60]
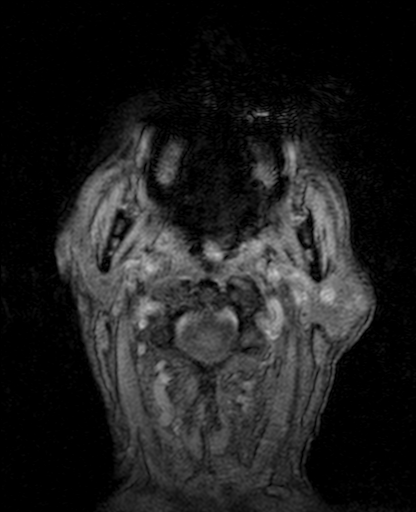
[im 12/60]
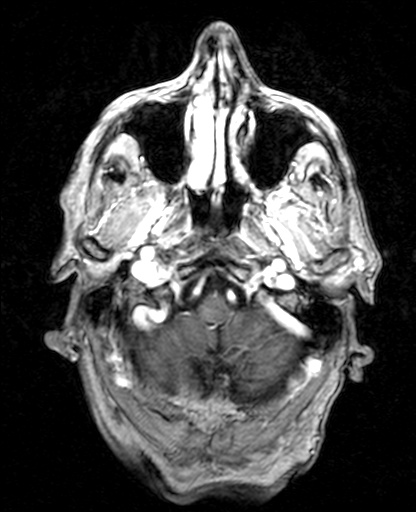
[im 24/60]
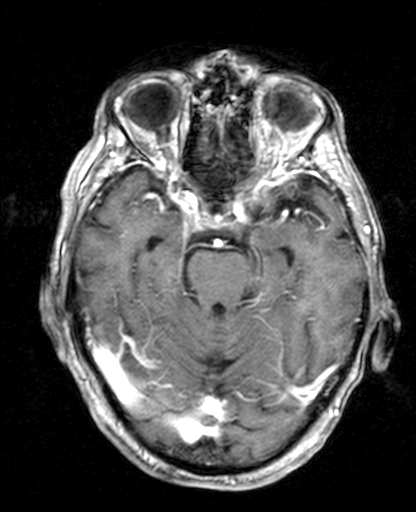
[im 36/60]
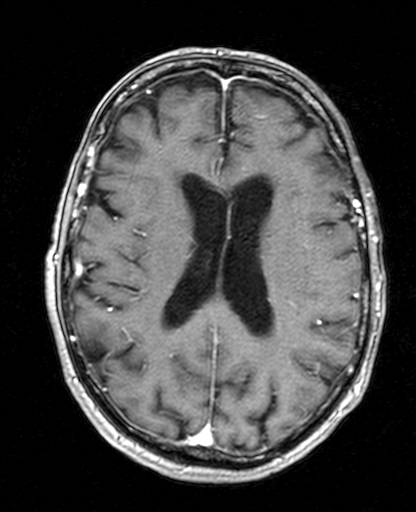
[im 48/60]
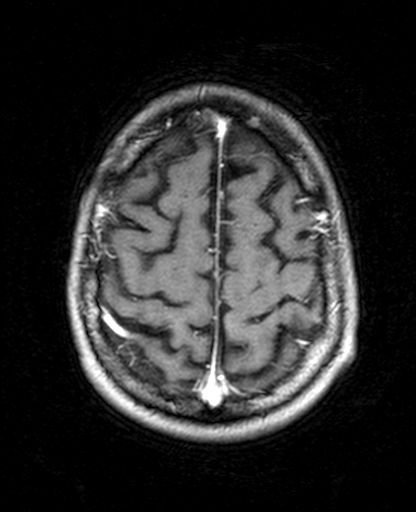
[im 60/60]
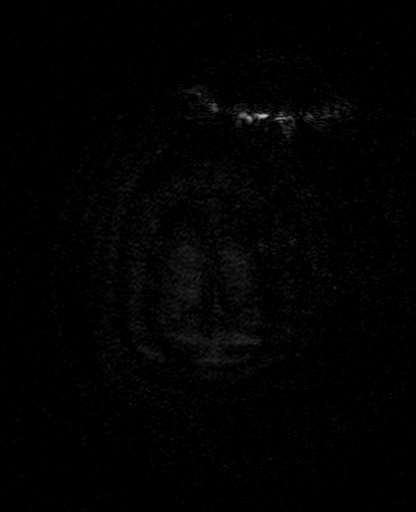

[Series 16: T1 post-contrast · coronal · 5.0mm · 0.45mm/px · 3 of 31 slices shown (2 of 3)]
[im 1/31]
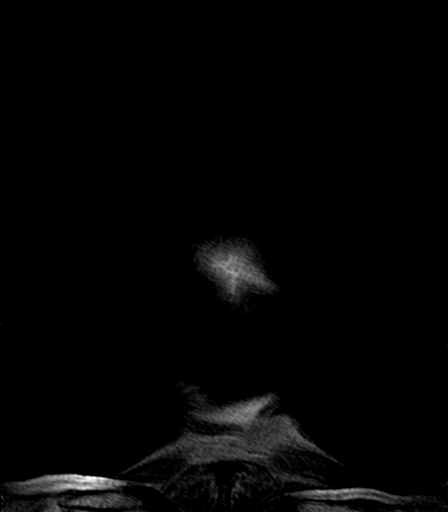
[im 16/31]
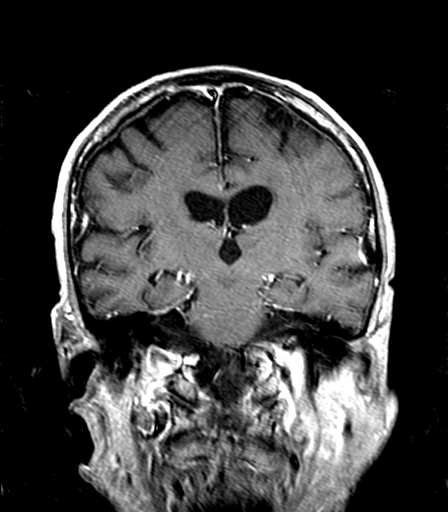
[im 31/31]
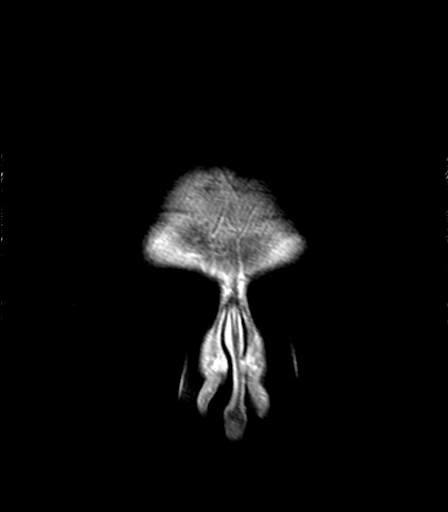

[Series 17: T1 post-contrast · sagittal · 5.0mm · 0.45mm/px · 3 of 29 slices shown (3 of 3)]
[im 1/29]
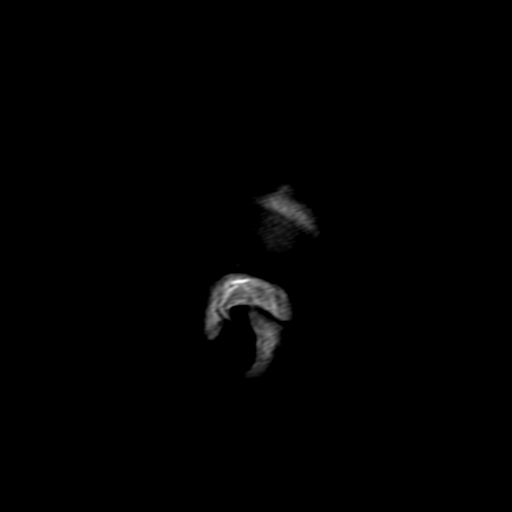
[im 15/29]
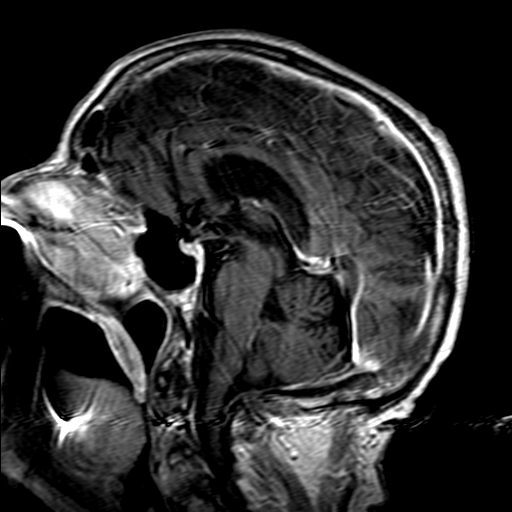
[im 29/29]
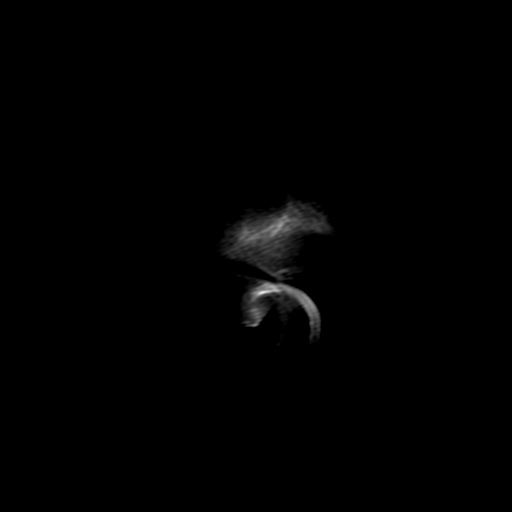

[36 of 48 positions shown; findings below may reference images not displayed]

FINDINGS: The patient was unable to remain motionless for the exam. Small or
subtle lesions could be overlooked.

No evidence for acute infarction, hemorrhage, mass lesion,
hydrocephalus, or extra-axial fluid. Generalized atrophy. Mild
subcortical and periventricular T2 and FLAIR hyperintensities,
likely chronic microvascular ischemic change.

Pituitary, pineal, and cerebellar tonsils unremarkable. Cervical
spondylosis. Mild to moderate pannus surrounds the odontoid. Flow
voids are maintained throughout the carotid, basilar, and vertebral
arteries. There are no areas of chronic hemorrhage.

Post infusion, no abnormal enhancement of the brain or meninges.

Visualized calvarium, skull base, and upper cervical osseous
structures unremarkable. Scalp and extracranial soft tissues,
sinuses, and mastoids show no acute process. BILATERAL cataract
extraction.
IMPRESSION: Atrophy and small vessel disease.  No acute intracranial findings.

No features of metastatic disease are identified.
